# Patient Record
Sex: Female | Born: 1935 | Race: White | Hispanic: No | Smoking: Never smoker
Health system: Southern US, Community
[De-identification: ages and names within clinical notes are randomized; demographics above are authoritative.]

## PROBLEM LIST (undated history)

## (undated) DIAGNOSIS — I10 Essential (primary) hypertension: Secondary | ICD-10-CM

## (undated) DIAGNOSIS — J189 Pneumonia, unspecified organism: Secondary | ICD-10-CM

## (undated) DIAGNOSIS — E039 Hypothyroidism, unspecified: Secondary | ICD-10-CM

## (undated) DIAGNOSIS — Z8719 Personal history of other diseases of the digestive system: Secondary | ICD-10-CM

## (undated) DIAGNOSIS — R569 Unspecified convulsions: Secondary | ICD-10-CM

## (undated) DIAGNOSIS — N189 Chronic kidney disease, unspecified: Secondary | ICD-10-CM

## (undated) DIAGNOSIS — E7800 Pure hypercholesterolemia, unspecified: Secondary | ICD-10-CM

## (undated) DIAGNOSIS — C801 Malignant (primary) neoplasm, unspecified: Secondary | ICD-10-CM

## (undated) DIAGNOSIS — M4800 Spinal stenosis, site unspecified: Secondary | ICD-10-CM

## (undated) DIAGNOSIS — I890 Lymphedema, not elsewhere classified: Secondary | ICD-10-CM

## (undated) DIAGNOSIS — M1990 Unspecified osteoarthritis, unspecified site: Secondary | ICD-10-CM

## (undated) HISTORY — PX: TUBAL LIGATION: SHX77

## (undated) HISTORY — PX: APPENDECTOMY: SHX54

---

## 2004-02-26 ENCOUNTER — Ambulatory Visit: Payer: Self-pay | Admitting: Family Medicine

## 2004-06-13 ENCOUNTER — Ambulatory Visit: Payer: Self-pay

## 2005-01-13 ENCOUNTER — Ambulatory Visit: Payer: Self-pay | Admitting: Internal Medicine

## 2005-05-14 ENCOUNTER — Ambulatory Visit: Payer: Self-pay | Admitting: Family Medicine

## 2005-11-12 ENCOUNTER — Ambulatory Visit: Payer: Self-pay | Admitting: Gastroenterology

## 2006-05-18 ENCOUNTER — Ambulatory Visit: Payer: Self-pay | Admitting: Family Medicine

## 2006-08-26 ENCOUNTER — Ambulatory Visit: Payer: Self-pay | Admitting: Family Medicine

## 2006-09-03 ENCOUNTER — Ambulatory Visit: Payer: Self-pay | Admitting: Family Medicine

## 2007-08-16 ENCOUNTER — Ambulatory Visit: Payer: Self-pay | Admitting: Family Medicine

## 2008-09-05 ENCOUNTER — Ambulatory Visit: Payer: Self-pay | Admitting: Family Medicine

## 2009-10-15 ENCOUNTER — Ambulatory Visit: Payer: Self-pay | Admitting: Family Medicine

## 2010-12-18 ENCOUNTER — Ambulatory Visit: Payer: Self-pay | Admitting: Family Medicine

## 2012-01-13 ENCOUNTER — Ambulatory Visit: Payer: Self-pay | Admitting: Family Medicine

## 2012-02-10 DIAGNOSIS — R569 Unspecified convulsions: Secondary | ICD-10-CM

## 2012-02-10 HISTORY — DX: Unspecified convulsions: R56.9

## 2012-10-09 ENCOUNTER — Inpatient Hospital Stay: Payer: Self-pay | Admitting: Internal Medicine

## 2012-10-09 LAB — DRUG SCREEN, URINE
Amphetamines, Ur Screen: NEGATIVE (ref ?–1000)
Benzodiazepine, Ur Scrn: NEGATIVE (ref ?–200)
Cannabinoid 50 Ng, Ur ~~LOC~~: NEGATIVE (ref ?–50)
Cocaine Metabolite,Ur ~~LOC~~: NEGATIVE (ref ?–300)
MDMA (Ecstasy)Ur Screen: NEGATIVE (ref ?–500)
Opiate, Ur Screen: POSITIVE (ref ?–300)
Tricyclic, Ur Screen: NEGATIVE (ref ?–1000)

## 2012-10-09 LAB — TROPONIN I
Troponin-I: <0.02 ng/mL
Troponin-I: 0.03 ng/mL

## 2012-10-09 LAB — URINALYSIS, COMPLETE
Bilirubin,UR: NEGATIVE
Glucose,UR: NEGATIVE mg/dL (ref 0–75)
Leukocyte Esterase: NEGATIVE
Nitrite: NEGATIVE

## 2012-10-09 LAB — COMPREHENSIVE METABOLIC PANEL
Albumin: 3 g/dL — ABNORMAL LOW (ref 3.4–5.0)
Alkaline Phosphatase: 52 U/L (ref 50–136)
Anion Gap: 28 — ABNORMAL HIGH (ref 7–16)
Bilirubin,Total: 0.4 mg/dL (ref 0.2–1.0)
Calcium, Total: 7.9 mg/dL — ABNORMAL LOW (ref 8.5–10.1)
Co2: 7 mmol/L — CL (ref 21–32)
Creatinine: 3.96 mg/dL — ABNORMAL HIGH (ref 0.60–1.30)
EGFR (African American): 12 — ABNORMAL LOW
EGFR (Non-African Amer.): 10 — ABNORMAL LOW
Glucose: 250 mg/dL — ABNORMAL HIGH (ref 65–99)
Osmolality: 280 (ref 275–301)
SGPT (ALT): 55 U/L (ref 12–78)
Total Protein: 6.7 g/dL (ref 6.4–8.2)

## 2012-10-09 LAB — CBC
HCT: 38.7 % (ref 35.0–47.0)
HGB: 12.8 g/dL (ref 12.0–16.0)
MCH: 31.4 pg (ref 26.0–34.0)
MCHC: 33 g/dL (ref 32.0–36.0)
MCV: 95 fL (ref 80–100)
Platelet: 118 10*3/uL — ABNORMAL LOW (ref 150–440)
RBC: 4.07 10*6/uL (ref 3.80–5.20)
RDW: 13.8 % (ref 11.5–14.5)
WBC: 13.2 10*3/uL — ABNORMAL HIGH (ref 3.6–11.0)

## 2012-10-09 LAB — CK TOTAL AND CKMB (NOT AT ARMC)
CK, Total: 2279 U/L — ABNORMAL HIGH (ref 21–215)
CK, Total: 1732 U/L — ABNORMAL HIGH (ref 21–215)
CK-MB: 24.7 ng/mL — ABNORMAL HIGH (ref 0.5–3.6)
CK-MB: 22.2 ng/mL — ABNORMAL HIGH (ref 0.5–3.6)

## 2012-10-09 LAB — BASIC METABOLIC PANEL
BUN: 55 mg/dL — ABNORMAL HIGH (ref 7–18)
Chloride: 100 mmol/L (ref 98–107)
Creatinine: 2.9 mg/dL — ABNORMAL HIGH (ref 0.60–1.30)
EGFR (African American): 17 — ABNORMAL LOW
Glucose: 411 mg/dL — ABNORMAL HIGH (ref 65–99)
Osmolality: 290 (ref 275–301)
Sodium: 128 mmol/L — ABNORMAL LOW (ref 136–145)

## 2012-10-09 LAB — PROTIME-INR: Prothrombin Time: 16.5 secs — ABNORMAL HIGH (ref 11.5–14.7)

## 2012-10-10 ENCOUNTER — Ambulatory Visit: Payer: Self-pay | Admitting: Neurology

## 2012-10-10 LAB — CBC WITH DIFFERENTIAL/PLATELET
Basophil #: 0.2 10*3/uL — ABNORMAL HIGH (ref 0.0–0.1)
Eosinophil %: 0 %
HCT: 33.6 % — ABNORMAL LOW (ref 35.0–47.0)
Lymphocyte #: 1.9 10*3/uL (ref 1.0–3.6)
MCH: 31.4 pg (ref 26.0–34.0)
MCHC: 34.5 g/dL (ref 32.0–36.0)
Monocyte #: 1 x10 3/mm — ABNORMAL HIGH (ref 0.2–0.9)
Monocyte %: 9.2 %
Neutrophil %: 72.2 %
Platelet: 121 10*3/uL — ABNORMAL LOW (ref 150–440)
RBC: 3.69 10*6/uL — ABNORMAL LOW (ref 3.80–5.20)

## 2012-10-10 LAB — COMPREHENSIVE METABOLIC PANEL
Albumin: 2.2 g/dL — ABNORMAL LOW (ref 3.4–5.0)
Anion Gap: 9 (ref 7–16)
BUN: 44 mg/dL — ABNORMAL HIGH (ref 7–18)
Bilirubin,Total: 0.3 mg/dL (ref 0.2–1.0)
Chloride: 108 mmol/L — ABNORMAL HIGH (ref 98–107)
Creatinine: 2.7 mg/dL — ABNORMAL HIGH (ref 0.60–1.30)
EGFR (Non-African Amer.): 16 — ABNORMAL LOW
Glucose: 185 mg/dL — ABNORMAL HIGH (ref 65–99)
Potassium: 3.8 mmol/L (ref 3.5–5.1)
SGOT(AST): 130 U/L — ABNORMAL HIGH (ref 15–37)
SGPT (ALT): 44 U/L (ref 12–78)
Sodium: 134 mmol/L — ABNORMAL LOW (ref 136–145)
Total Protein: 5.2 g/dL — ABNORMAL LOW (ref 6.4–8.2)

## 2012-10-10 LAB — MAGNESIUM: Magnesium: 1.1 mg/dL — ABNORMAL LOW

## 2012-10-10 LAB — HEMOGLOBIN A1C: Hemoglobin A1C: 6.7 % — ABNORMAL HIGH (ref 4.2–6.3)

## 2012-10-10 LAB — CK TOTAL AND CKMB (NOT AT ARMC): CK-MB: 22.4 ng/mL — ABNORMAL HIGH (ref 0.5–3.6)

## 2012-10-11 LAB — CBC WITH DIFFERENTIAL/PLATELET
Basophil #: 0.1 10*3/uL (ref 0.0–0.1)
Basophil %: 0.6 %
HGB: 11 g/dL — ABNORMAL LOW (ref 12.0–16.0)
Lymphocyte %: 22.9 %
MCV: 91 fL (ref 80–100)
Monocyte %: 9.4 %
Neutrophil %: 67 %
Platelet: 109 10*3/uL — ABNORMAL LOW (ref 150–440)
WBC: 9.8 10*3/uL (ref 3.6–11.0)

## 2012-10-11 LAB — BASIC METABOLIC PANEL
Calcium, Total: 6.4 mg/dL — CL (ref 8.5–10.1)
Chloride: 110 mmol/L — ABNORMAL HIGH (ref 98–107)
Co2: 17 mmol/L — ABNORMAL LOW (ref 21–32)
EGFR (African American): 25 — ABNORMAL LOW
Glucose: 169 mg/dL — ABNORMAL HIGH (ref 65–99)
Osmolality: 278 (ref 275–301)
Potassium: 4.4 mmol/L (ref 3.5–5.1)

## 2012-10-11 LAB — CK: CK, Total: 1788 U/L — ABNORMAL HIGH (ref 21–215)

## 2012-10-11 LAB — URINE CULTURE

## 2012-10-11 LAB — MAGNESIUM: Magnesium: 2 mg/dL

## 2012-10-11 LAB — CLOSTRIDIUM DIFFICILE BY PCR

## 2012-10-12 LAB — BASIC METABOLIC PANEL
Anion Gap: 8 (ref 7–16)
Calcium, Total: 6.6 mg/dL — CL (ref 8.5–10.1)
Co2: 17 mmol/L — ABNORMAL LOW (ref 21–32)
EGFR (African American): 35 — ABNORMAL LOW
EGFR (Non-African Amer.): 30 — ABNORMAL LOW
Osmolality: 275 (ref 275–301)
Sodium: 136 mmol/L (ref 136–145)

## 2012-10-12 LAB — CK: CK, Total: 4722 U/L — ABNORMAL HIGH (ref 21–215)

## 2012-10-13 LAB — BASIC METABOLIC PANEL
Anion Gap: 7 (ref 7–16)
Chloride: 110 mmol/L — ABNORMAL HIGH (ref 98–107)
Creatinine: 1.34 mg/dL — ABNORMAL HIGH (ref 0.60–1.30)
EGFR (African American): 43 — ABNORMAL LOW
EGFR (Non-African Amer.): 37 — ABNORMAL LOW
Glucose: 124 mg/dL — ABNORMAL HIGH (ref 65–99)
Osmolality: 275 (ref 275–301)
Potassium: 4.2 mmol/L (ref 3.5–5.1)

## 2012-10-13 LAB — PRO B NATRIURETIC PEPTIDE: B-Type Natriuretic Peptide: 1757 pg/mL — ABNORMAL HIGH (ref 0–450)

## 2012-10-14 DIAGNOSIS — I359 Nonrheumatic aortic valve disorder, unspecified: Secondary | ICD-10-CM

## 2012-10-14 LAB — BASIC METABOLIC PANEL
Calcium, Total: 7.7 mg/dL — ABNORMAL LOW (ref 8.5–10.1)
Creatinine: 1.15 mg/dL (ref 0.60–1.30)
Glucose: 123 mg/dL — ABNORMAL HIGH (ref 65–99)
Sodium: 137 mmol/L (ref 136–145)

## 2012-10-14 LAB — CULTURE, BLOOD (SINGLE)

## 2012-10-14 LAB — TROPONIN I: Troponin-I: 0.11 ng/mL — ABNORMAL HIGH

## 2012-10-14 LAB — PLATELET COUNT: Platelet: 287 10*3/uL (ref 150–440)

## 2012-10-15 LAB — TROPONIN I: Troponin-I: 0.08 ng/mL — ABNORMAL HIGH

## 2012-10-15 LAB — STOOL CULTURE

## 2012-10-16 ENCOUNTER — Ambulatory Visit: Payer: Self-pay | Admitting: Orthopedic Surgery

## 2012-10-18 LAB — CREATININE, SERUM
EGFR (African American): 51 — ABNORMAL LOW
EGFR (Non-African Amer.): 44 — ABNORMAL LOW

## 2012-10-19 LAB — CBC WITH DIFFERENTIAL/PLATELET
Basophil #: 0 10*3/uL (ref 0.0–0.1)
Basophil %: 0.1 %
Eosinophil #: 0.1 10*3/uL (ref 0.0–0.7)
HCT: 30.3 % — ABNORMAL LOW (ref 35.0–47.0)
HGB: 10.7 g/dL — ABNORMAL LOW (ref 12.0–16.0)
Lymphocyte %: 20.6 %
MCHC: 35.3 g/dL (ref 32.0–36.0)
MCV: 93 fL (ref 80–100)
Platelet: 487 10*3/uL — ABNORMAL HIGH (ref 150–440)
RBC: 3.26 10*6/uL — ABNORMAL LOW (ref 3.80–5.20)

## 2012-10-20 LAB — BASIC METABOLIC PANEL
Anion Gap: 8 (ref 7–16)
Anion Gap: 7 (ref 7–16)
BUN: 24 mg/dL — ABNORMAL HIGH (ref 7–18)
Calcium, Total: 8.5 mg/dL (ref 8.5–10.1)
Calcium, Total: 8.6 mg/dL (ref 8.5–10.1)
Chloride: 98 mmol/L (ref 98–107)
Chloride: 98 mmol/L (ref 98–107)
Creatinine: 1.42 mg/dL — ABNORMAL HIGH (ref 0.60–1.30)
Creatinine: 1.45 mg/dL — ABNORMAL HIGH (ref 0.60–1.30)
EGFR (African American): 40 — ABNORMAL LOW
EGFR (Non-African Amer.): 35 — ABNORMAL LOW
EGFR (Non-African Amer.): 34 — ABNORMAL LOW
Glucose: 114 mg/dL — ABNORMAL HIGH (ref 65–99)
Glucose: 189 mg/dL — ABNORMAL HIGH (ref 65–99)
Osmolality: 269 (ref 275–301)
Osmolality: 272 (ref 275–301)
Potassium: 4.1 mmol/L (ref 3.5–5.1)
Potassium: 4 mmol/L (ref 3.5–5.1)
Sodium: 132 mmol/L — ABNORMAL LOW (ref 136–145)
Sodium: 131 mmol/L — ABNORMAL LOW (ref 136–145)

## 2012-10-21 LAB — BASIC METABOLIC PANEL
BUN: 24 mg/dL — ABNORMAL HIGH (ref 7–18)
Chloride: 98 mmol/L (ref 98–107)
Creatinine: 1.24 mg/dL (ref 0.60–1.30)
EGFR (African American): 48 — ABNORMAL LOW
Osmolality: 273 (ref 275–301)
Potassium: 4 mmol/L (ref 3.5–5.1)
Sodium: 131 mmol/L — ABNORMAL LOW (ref 136–145)

## 2012-11-03 LAB — CBC
HCT: 33.3 % — ABNORMAL LOW (ref 35.0–47.0)
HGB: 11.3 g/dL — ABNORMAL LOW (ref 12.0–16.0)
MCH: 32.3 pg (ref 26.0–34.0)
MCHC: 34 g/dL (ref 32.0–36.0)
RBC: 3.5 10*6/uL — ABNORMAL LOW (ref 3.80–5.20)
RDW: 15.4 % — ABNORMAL HIGH (ref 11.5–14.5)
WBC: 22.3 10*3/uL — ABNORMAL HIGH (ref 3.6–11.0)

## 2012-11-03 LAB — COMPREHENSIVE METABOLIC PANEL
Albumin: 3.3 g/dL — ABNORMAL LOW (ref 3.4–5.0)
Alkaline Phosphatase: 180 U/L — ABNORMAL HIGH (ref 50–136)
Anion Gap: 8 (ref 7–16)
BUN: 23 mg/dL — ABNORMAL HIGH (ref 7–18)
Co2: 28 mmol/L (ref 21–32)
EGFR (Non-African Amer.): 32 — ABNORMAL LOW
SGOT(AST): 25 U/L (ref 15–37)
Sodium: 134 mmol/L — ABNORMAL LOW (ref 136–145)

## 2012-11-03 LAB — TROPONIN I: Troponin-I: <0.02 ng/mL

## 2012-11-03 LAB — T4, FREE: Free Thyroxine: 1.42 ng/dL (ref 0.76–1.46)

## 2012-11-03 LAB — URINALYSIS, COMPLETE
Ketone: NEGATIVE
Leukocyte Esterase: NEGATIVE
Nitrite: NEGATIVE
Ph: 5 (ref 4.5–8.0)
Protein: NEGATIVE
RBC,UR: 1 /HPF (ref 0–5)
Squamous Epithelial: NONE SEEN

## 2012-11-03 LAB — CK TOTAL AND CKMB (NOT AT ARMC)
CK, Total: 56 U/L (ref 21–215)
CK-MB: 0.9 ng/mL (ref 0.5–3.6)

## 2012-11-04 ENCOUNTER — Inpatient Hospital Stay: Payer: Self-pay | Admitting: Specialist

## 2012-11-04 LAB — BASIC METABOLIC PANEL
Anion Gap: 8 (ref 7–16)
BUN: 23 mg/dL — ABNORMAL HIGH (ref 7–18)
Calcium, Total: 7.9 mg/dL — ABNORMAL LOW (ref 8.5–10.1)
Chloride: 99 mmol/L (ref 98–107)
Creatinine: 1.27 mg/dL (ref 0.60–1.30)
EGFR (African American): 46 — ABNORMAL LOW
EGFR (Non-African Amer.): 40 — ABNORMAL LOW
Osmolality: 276 (ref 275–301)
Potassium: 3 mmol/L — ABNORMAL LOW (ref 3.5–5.1)
Sodium: 135 mmol/L — ABNORMAL LOW (ref 136–145)

## 2012-11-04 LAB — CBC WITH DIFFERENTIAL/PLATELET
Basophil #: 0.1 10*3/uL (ref 0.0–0.1)
Basophil %: 0.4 %
Eosinophil %: 1.5 %
Lymphocyte #: 1.5 10*3/uL (ref 1.0–3.6)
Lymphocyte %: 12.2 %
MCH: 32.3 pg (ref 26.0–34.0)
MCHC: 34.4 g/dL (ref 32.0–36.0)
MCV: 94 fL (ref 80–100)
Monocyte #: 0.7 x10 3/mm (ref 0.2–0.9)
Monocyte %: 5.5 %
RBC: 2.91 10*6/uL — ABNORMAL LOW (ref 3.80–5.20)
RDW: 15 % — ABNORMAL HIGH (ref 11.5–14.5)
WBC: 12.6 10*3/uL — ABNORMAL HIGH (ref 3.6–11.0)

## 2012-11-04 LAB — MAGNESIUM: Magnesium: 0.7 mg/dL — ABNORMAL LOW

## 2012-11-04 LAB — AMMONIA: Ammonia, Plasma: <25 mcmol/L (ref 11–32)

## 2012-11-05 LAB — URINALYSIS, COMPLETE
Bilirubin,UR: NEGATIVE
Glucose,UR: 50 mg/dL (ref 0–75)
Ketone: NEGATIVE
Leukocyte Esterase: NEGATIVE
Nitrite: NEGATIVE
Ph: 5 (ref 4.5–8.0)
Squamous Epithelial: 1
WBC UR: 31 /HPF (ref 0–5)

## 2012-11-05 LAB — CBC WITH DIFFERENTIAL/PLATELET
Basophil #: 0.1 10*3/uL (ref 0.0–0.1)
Basophil %: 0.9 %
HCT: 27.9 % — ABNORMAL LOW (ref 35.0–47.0)
HGB: 9.6 g/dL — ABNORMAL LOW (ref 12.0–16.0)
Lymphocyte #: 1.4 10*3/uL (ref 1.0–3.6)
MCH: 32.6 pg (ref 26.0–34.0)
MCHC: 34.5 g/dL (ref 32.0–36.0)
MCV: 95 fL (ref 80–100)
Monocyte #: 0.8 x10 3/mm (ref 0.2–0.9)
Monocyte %: 9.2 %
Neutrophil #: 6.2 10*3/uL (ref 1.4–6.5)
Neutrophil %: 71.2 %
RDW: 15.3 % — ABNORMAL HIGH (ref 11.5–14.5)

## 2012-11-05 LAB — BASIC METABOLIC PANEL
BUN: 20 mg/dL — ABNORMAL HIGH (ref 7–18)
Chloride: 101 mmol/L (ref 98–107)
Co2: 29 mmol/L (ref 21–32)
Creatinine: 1.39 mg/dL — ABNORMAL HIGH (ref 0.60–1.30)
Glucose: 88 mg/dL (ref 65–99)
Osmolality: 274 (ref 275–301)
Potassium: 3.3 mmol/L — ABNORMAL LOW (ref 3.5–5.1)

## 2012-11-05 LAB — URINE CULTURE

## 2012-11-05 LAB — MAGNESIUM: Magnesium: 3.3 mg/dL — ABNORMAL HIGH

## 2012-11-06 LAB — BASIC METABOLIC PANEL
Anion Gap: 5 — ABNORMAL LOW (ref 7–16)
BUN: 17 mg/dL (ref 7–18)
Calcium, Total: 8.6 mg/dL (ref 8.5–10.1)
Chloride: 100 mmol/L (ref 98–107)
Co2: 29 mmol/L (ref 21–32)
Creatinine: 1.23 mg/dL (ref 0.60–1.30)
EGFR (African American): 48 — ABNORMAL LOW
Glucose: 97 mg/dL (ref 65–99)
Potassium: 3.9 mmol/L (ref 3.5–5.1)
Sodium: 134 mmol/L — ABNORMAL LOW (ref 136–145)

## 2012-11-09 LAB — CULTURE, BLOOD (SINGLE)

## 2012-11-11 ENCOUNTER — Ambulatory Visit: Payer: Self-pay | Admitting: Neurology

## 2012-11-14 ENCOUNTER — Emergency Department: Payer: Self-pay | Admitting: Emergency Medicine

## 2012-11-14 LAB — CBC
HCT: 33.5 % — ABNORMAL LOW (ref 35.0–47.0)
MCH: 32.2 pg (ref 26.0–34.0)
MCHC: 34.6 g/dL (ref 32.0–36.0)
MCV: 93 fL (ref 80–100)
RDW: 14.8 % — ABNORMAL HIGH (ref 11.5–14.5)
WBC: 9.1 10*3/uL (ref 3.6–11.0)

## 2012-11-14 LAB — COMPREHENSIVE METABOLIC PANEL
Albumin: 3.6 g/dL (ref 3.4–5.0)
Alkaline Phosphatase: 138 U/L — ABNORMAL HIGH (ref 50–136)
BUN: 14 mg/dL (ref 7–18)
Bilirubin,Total: 0.3 mg/dL (ref 0.2–1.0)
Chloride: 105 mmol/L (ref 98–107)
Creatinine: 1.77 mg/dL — ABNORMAL HIGH (ref 0.60–1.30)
EGFR (African American): 31 — ABNORMAL LOW
EGFR (Non-African Amer.): 27 — ABNORMAL LOW
Osmolality: 273 (ref 275–301)
Potassium: 4.1 mmol/L (ref 3.5–5.1)
SGPT (ALT): 27 U/L (ref 12–78)
Sodium: 136 mmol/L (ref 136–145)
Total Protein: 7.1 g/dL (ref 6.4–8.2)

## 2012-11-14 LAB — URINALYSIS, COMPLETE
Bilirubin,UR: NEGATIVE
Blood: NEGATIVE
Glucose,UR: NEGATIVE mg/dL (ref 0–75)
Leukocyte Esterase: NEGATIVE
RBC,UR: 1 /HPF (ref 0–5)
Specific Gravity: 1.02 (ref 1.003–1.030)
WBC UR: 2 /HPF (ref 0–5)

## 2012-11-21 ENCOUNTER — Emergency Department: Payer: Self-pay | Admitting: Emergency Medicine

## 2012-11-21 LAB — CBC
HCT: 30.6 % — ABNORMAL LOW (ref 35.0–47.0)
HGB: 10.6 g/dL — ABNORMAL LOW (ref 12.0–16.0)
MCHC: 34.8 g/dL (ref 32.0–36.0)
MCV: 93 fL (ref 80–100)
Platelet: 273 10*3/uL (ref 150–440)
RBC: 3.3 10*6/uL — ABNORMAL LOW (ref 3.80–5.20)
RDW: 14.7 % — ABNORMAL HIGH (ref 11.5–14.5)

## 2012-11-22 LAB — URINALYSIS, COMPLETE
Glucose,UR: NEGATIVE mg/dL (ref 0–75)
Ketone: NEGATIVE
Protein: NEGATIVE
RBC,UR: 1 /HPF (ref 0–5)
Squamous Epithelial: NONE SEEN
WBC UR: <1 /HPF (ref 0–5)

## 2012-11-22 LAB — COMPREHENSIVE METABOLIC PANEL
Albumin: 3.5 g/dL (ref 3.4–5.0)
Anion Gap: 9 (ref 7–16)
BUN: 23 mg/dL — ABNORMAL HIGH (ref 7–18)
Chloride: 103 mmol/L (ref 98–107)
Creatinine: 1.63 mg/dL — ABNORMAL HIGH (ref 0.60–1.30)
EGFR (African American): 34 — ABNORMAL LOW
EGFR (Non-African Amer.): 29 — ABNORMAL LOW
Glucose: 93 mg/dL (ref 65–99)
Osmolality: 273 (ref 275–301)
SGOT(AST): 34 U/L (ref 15–37)
SGPT (ALT): 24 U/L (ref 12–78)

## 2012-11-22 LAB — DRUG SCREEN, URINE
Barbiturates, Ur Screen: NEGATIVE (ref ?–200)
Benzodiazepine, Ur Scrn: NEGATIVE (ref ?–200)
Cocaine Metabolite,Ur ~~LOC~~: NEGATIVE (ref ?–300)
Phencyclidine (PCP) Ur S: NEGATIVE (ref ?–25)

## 2013-01-02 ENCOUNTER — Ambulatory Visit: Payer: Self-pay | Admitting: Family Medicine

## 2013-01-13 ENCOUNTER — Ambulatory Visit: Payer: Self-pay | Admitting: Family Medicine

## 2013-05-18 DIAGNOSIS — E785 Hyperlipidemia, unspecified: Secondary | ICD-10-CM | POA: Insufficient documentation

## 2013-05-18 DIAGNOSIS — E039 Hypothyroidism, unspecified: Secondary | ICD-10-CM | POA: Insufficient documentation

## 2013-05-18 DIAGNOSIS — I10 Essential (primary) hypertension: Secondary | ICD-10-CM | POA: Insufficient documentation

## 2014-02-07 ENCOUNTER — Ambulatory Visit: Payer: Self-pay | Admitting: Family Medicine

## 2014-02-17 IMAGING — CT CT HEAD WITHOUT CONTRAST
2 series · 16 of 30 positions shown, 20 images · non-contrast
Comparison: none

REASON FOR EXAM: ams
COMMENTS:

[Series 2: without · axial · non-contrast · 0.43mm/px · z∈[-105,+15]mm · 13 of 29 slices shown, 17 images]
[im 3/29  brain]
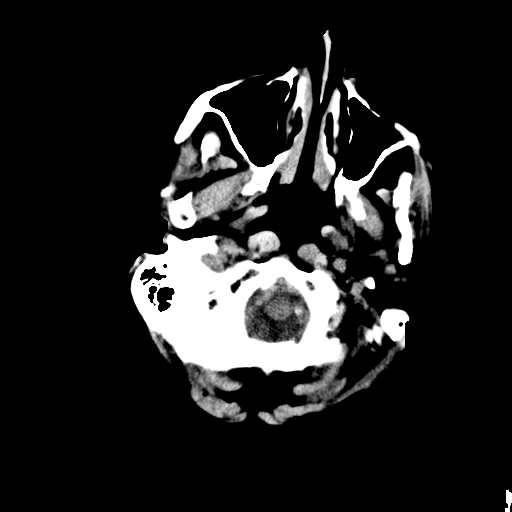
[im 3/29  bone]
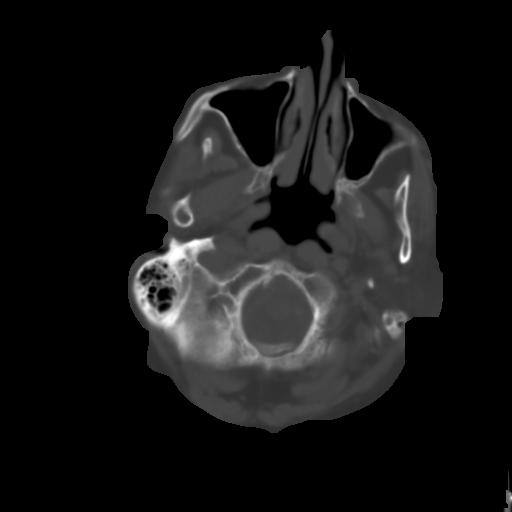
[im 5/29  brain]
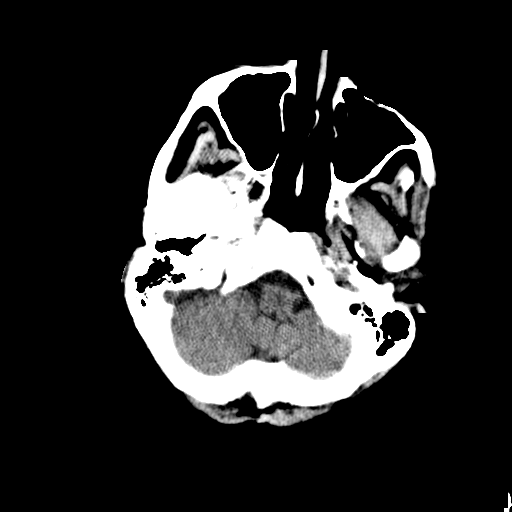
[im 7/29  brain]
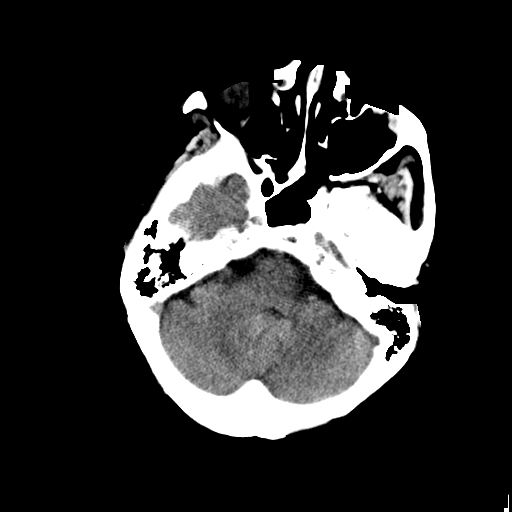
[im 9/29  brain]
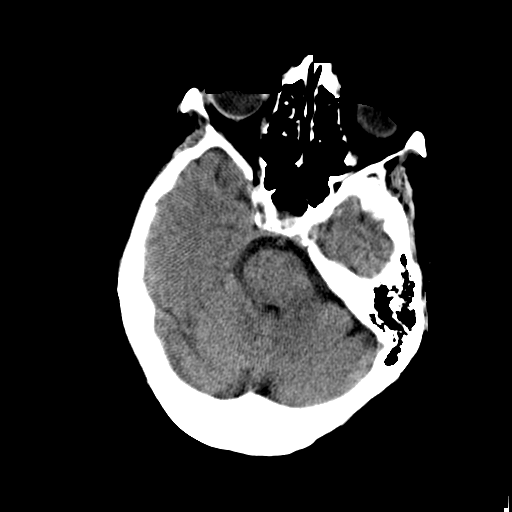
[im 11/29  brain]
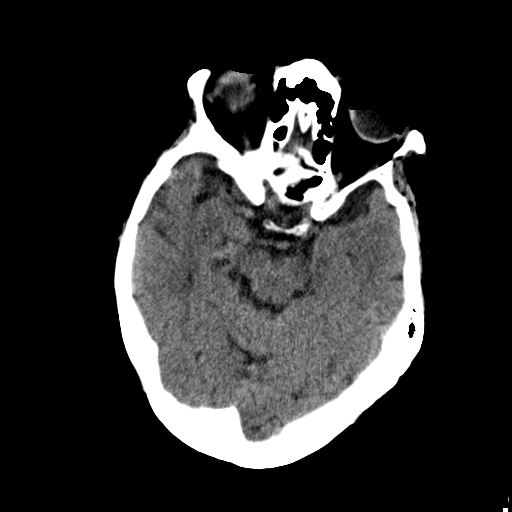
[im 11/29  bone]
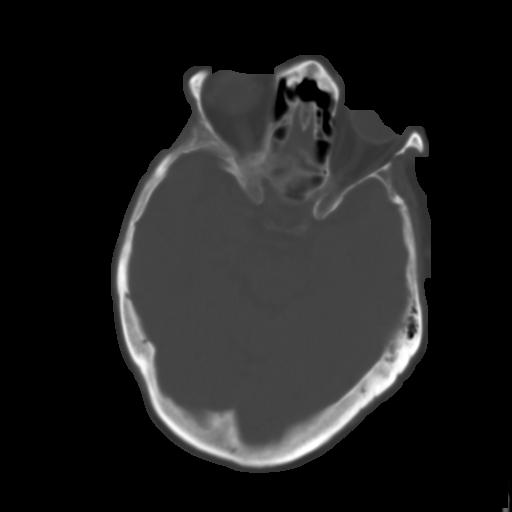
[im 13/29  brain]
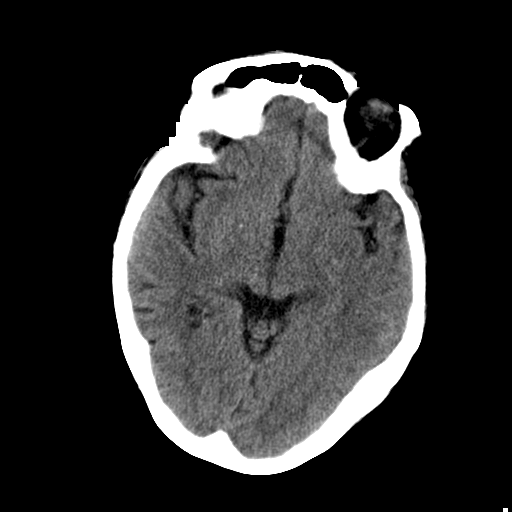
[im 15/29  brain]
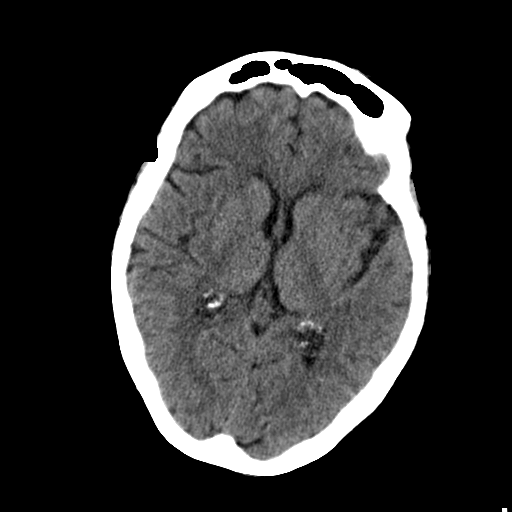
[im 17/29  brain]
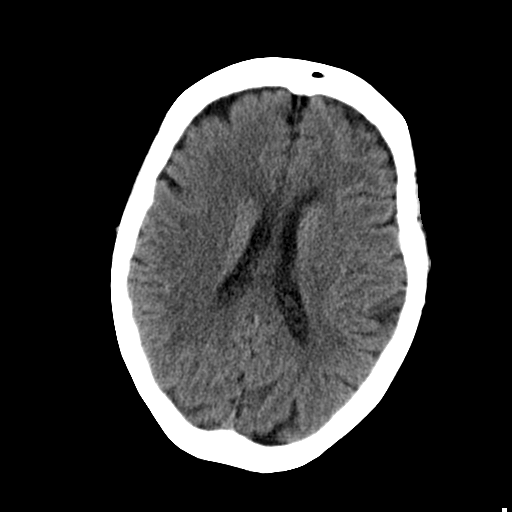
[im 19/29  brain]
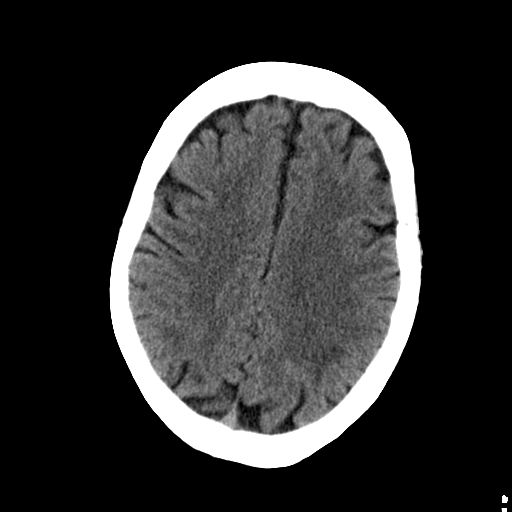
[im 19/29  bone]
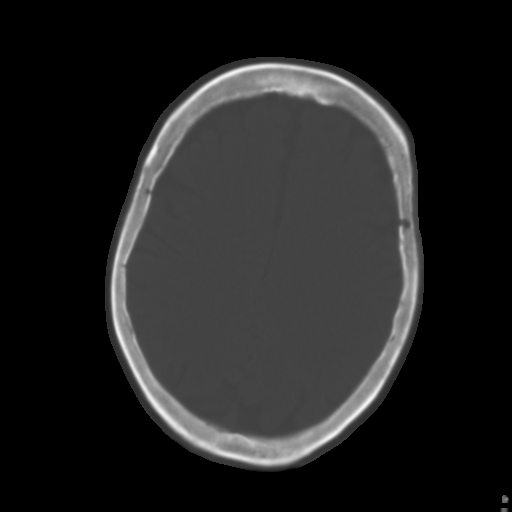
[im 21/29  brain]
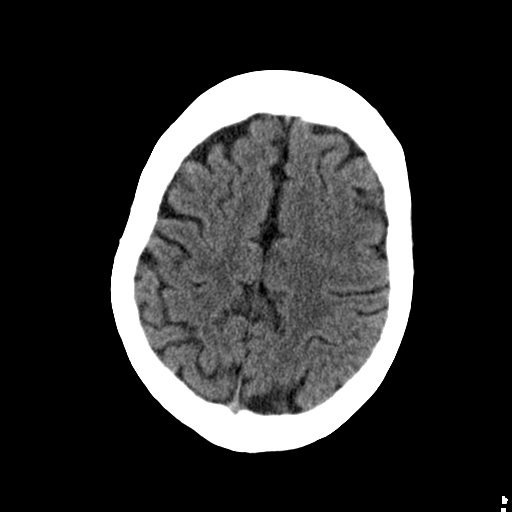
[im 23/29  brain]
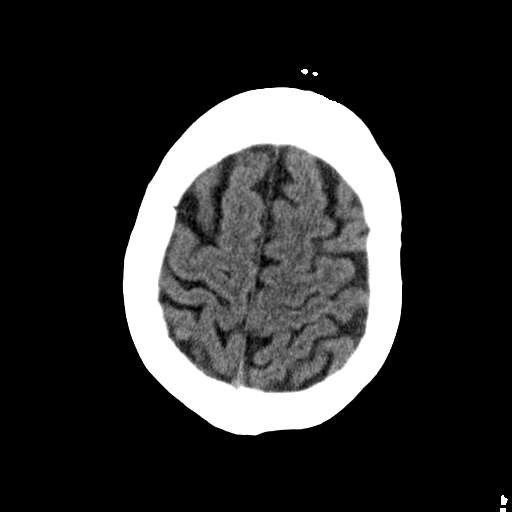
[im 25/29  brain]
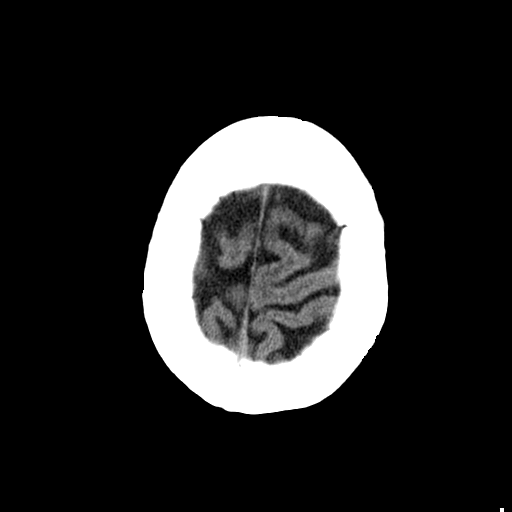
[im 27/29  brain]
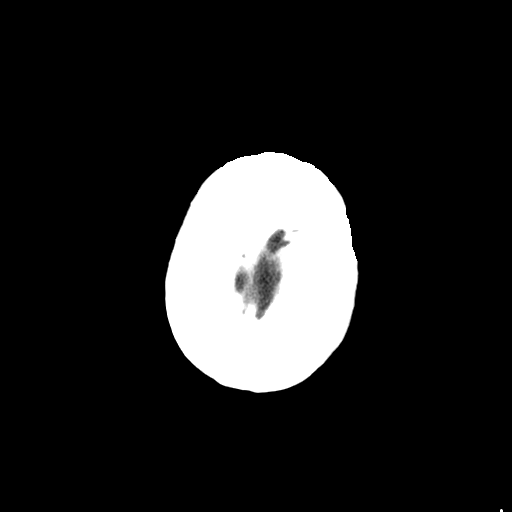
[im 27/29  bone]
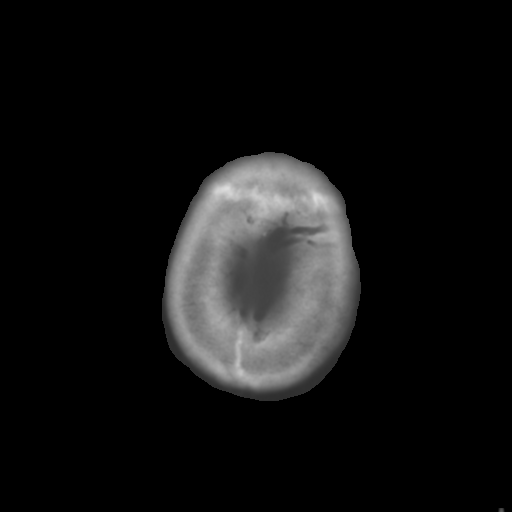

[Series 3: bone · axial · 0.43mm/px · z∈[-105,-65]mm · 3 of 29 slices shown]
[im 3/29  bone]
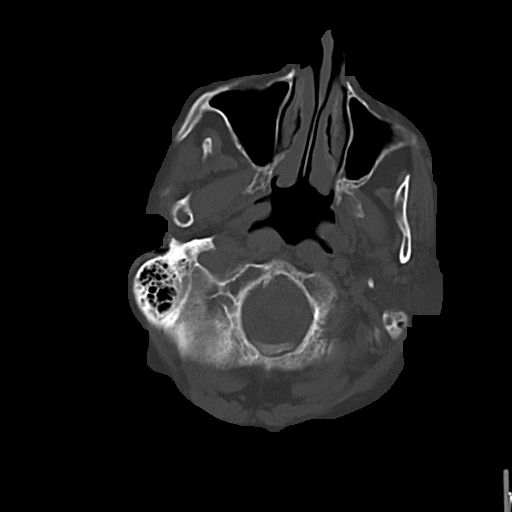
[im 7/29  bone]
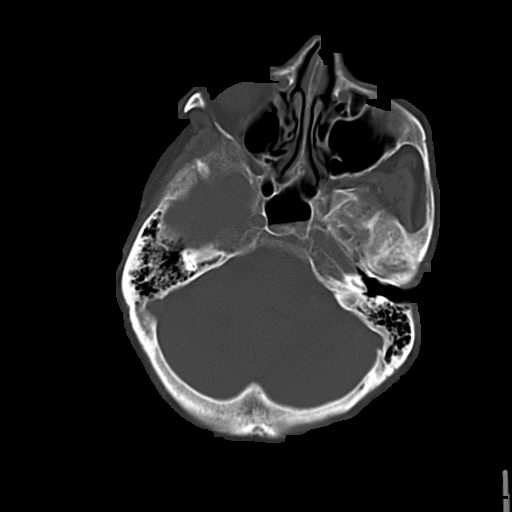
[im 11/29  bone]
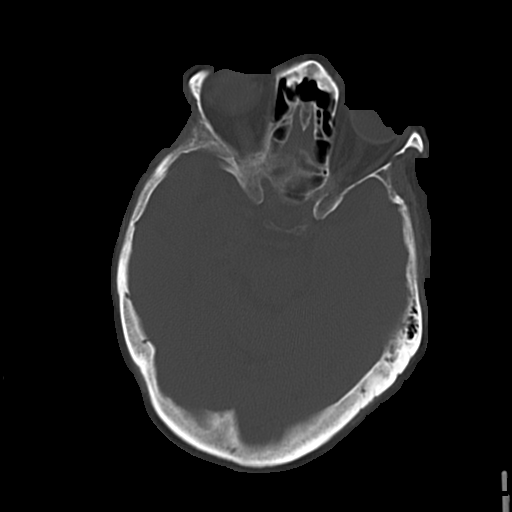

[16 of 30 positions shown; findings below may reference images not displayed]

PROCEDURE:     CT  - CT HEAD WITHOUT CONTRAST  - November 03, 2012  [DATE]

RESULT:     Noncontrast CT of the brain and is performed. The ventricles and
sulci are within normal limits for the patient's age. There is mild
low-attenuation in the periventricular and subcortical white matter. Basal
ganglia calcification is present. There is no intracranial hemorrhage, mass,
mass effect or evolving infarct. The sinuses and mastoid air cells show
normal appearing aeration. The calvarium appears intact.
IMPRESSION: 1. Atrophy with chronic microvascular ischemic disease. No acute
intracranial abnormality.

[REDACTED]

## 2014-02-17 IMAGING — CT CT CHEST-ABD-PELV W/O CM
1 of 2 series · 13 of 29 positions shown, 18 images · non-contrast
Comparison: none

REASON FOR EXAM: (1) left lung opacitty; (2) abd pain
COMMENTS:

[Series 2: soft tissue · axial · 0.89mm/px · z∈[-950,-426]mm · 13 of 197 slices shown, 18 images]
[im 11/197  mediastinal]
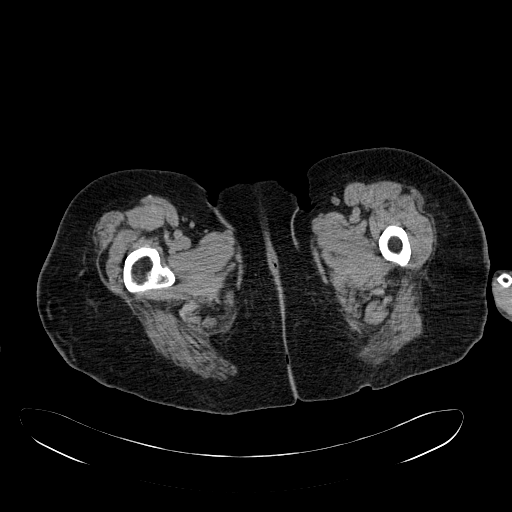
[im 11/197  bone]
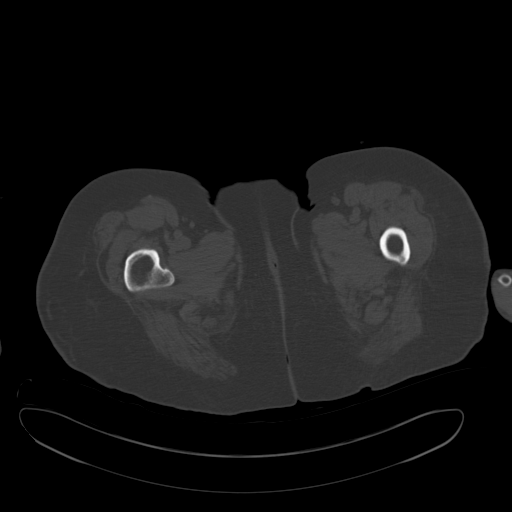
[im 33/197  mediastinal]
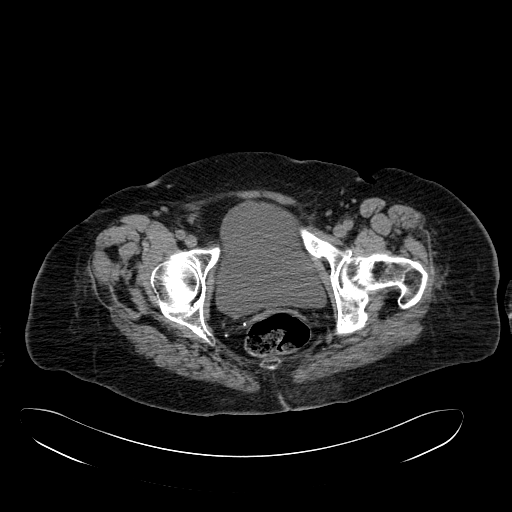
[im 44/197  mediastinal]
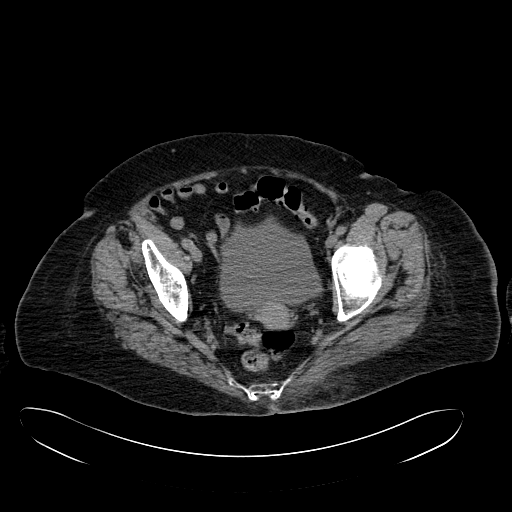
[im 66/197  mediastinal]
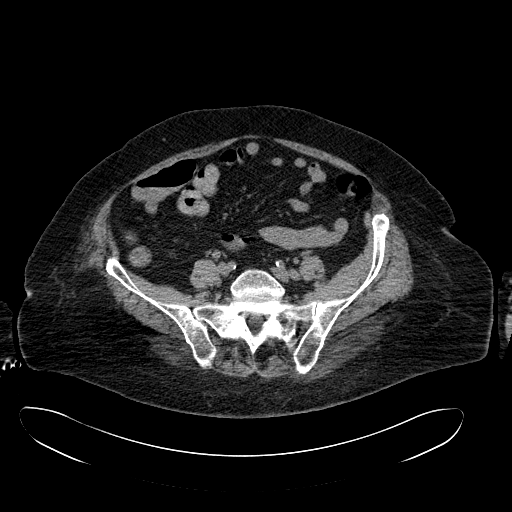
[im 77/197  mediastinal]
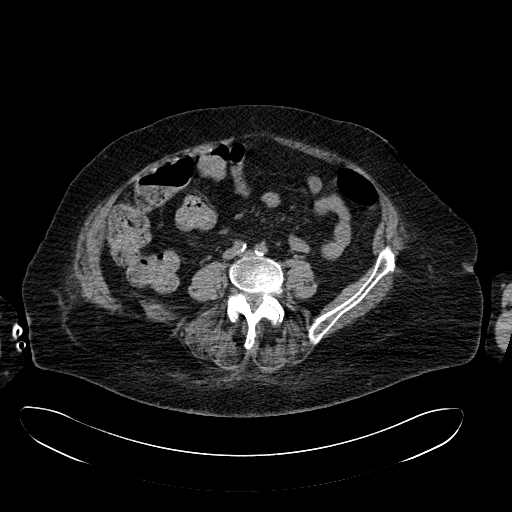
[im 96/197  mediastinal]
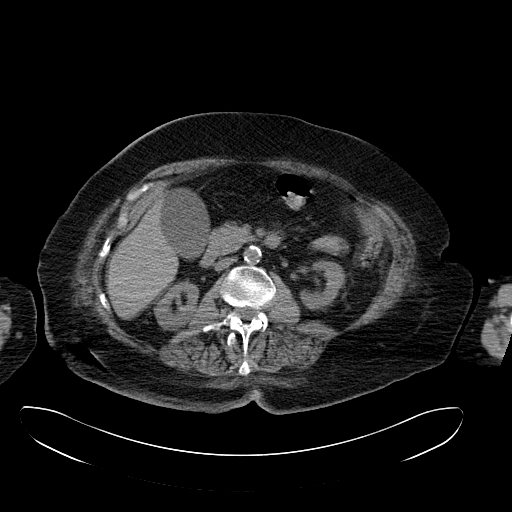
[im 99/197  mediastinal]
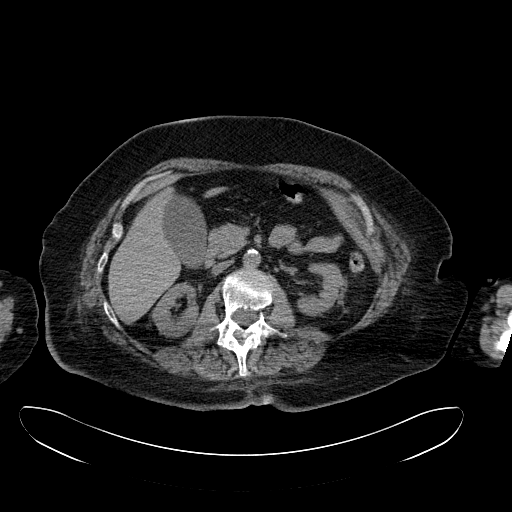
[im 120/197  mediastinal]
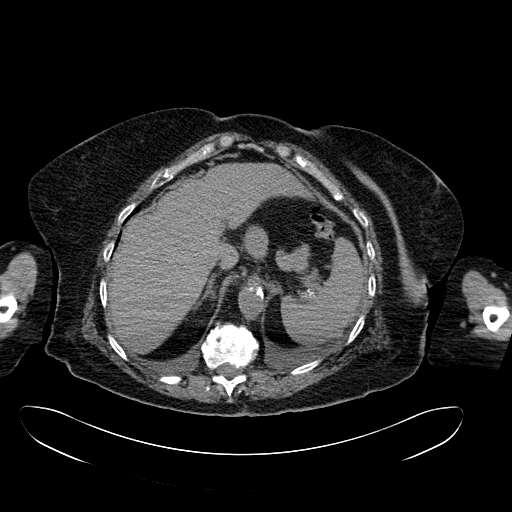
[im 131/197  mediastinal]
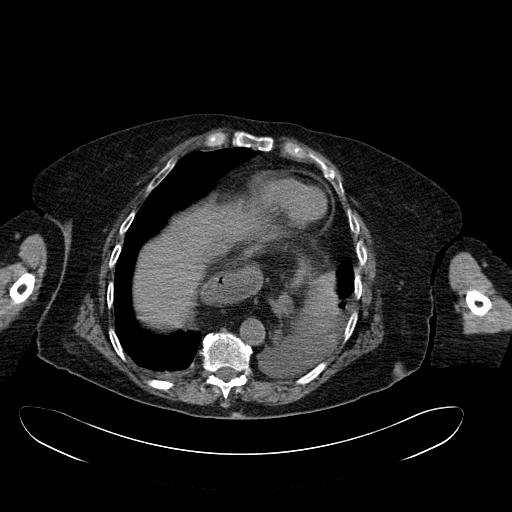
[im 131/197  bone]
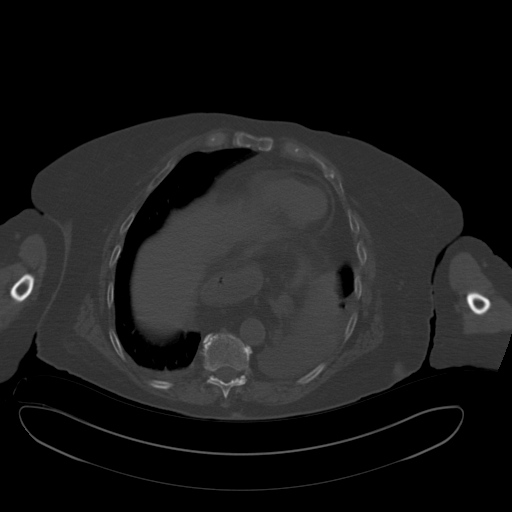
[im 153/197  mediastinal]
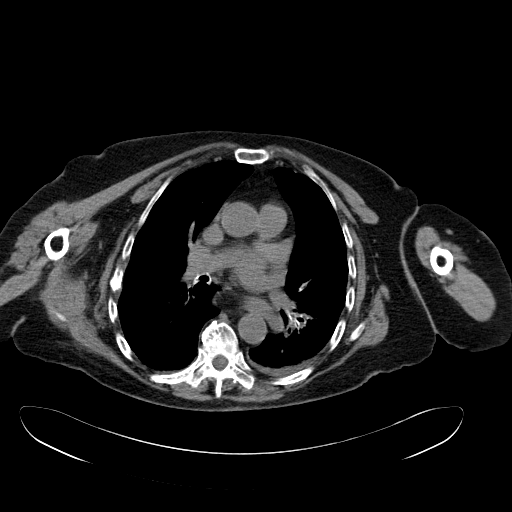
[im 153/197  lung]
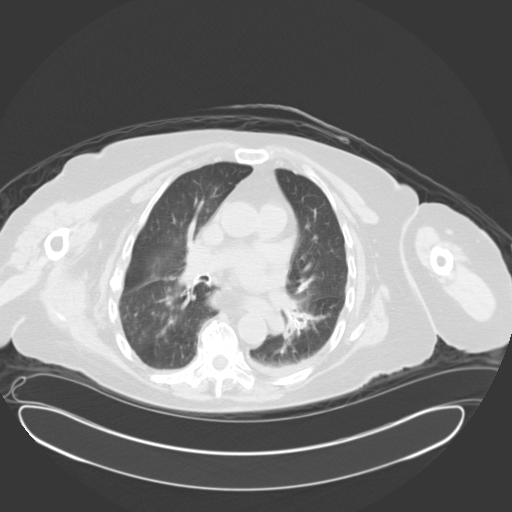
[im 164/197  mediastinal]
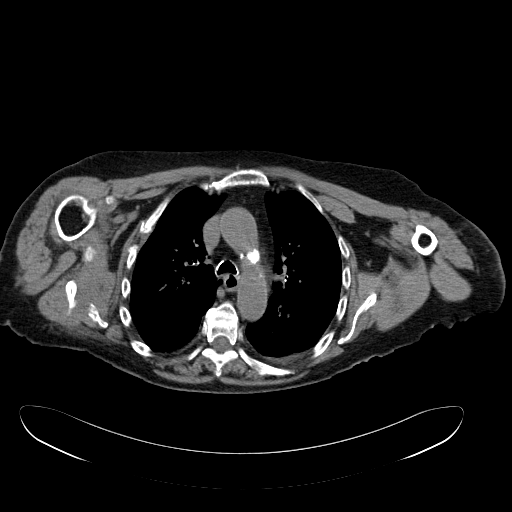
[im 164/197  lung]
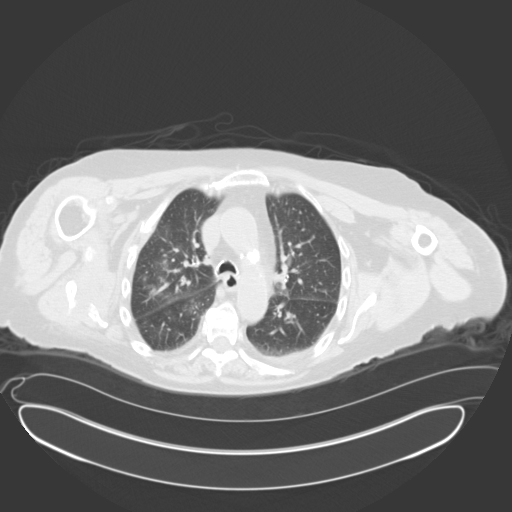
[im 175/197  lung]
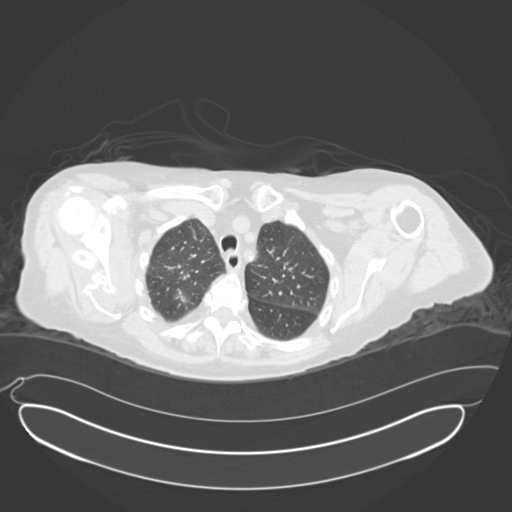
[im 186/197  mediastinal]
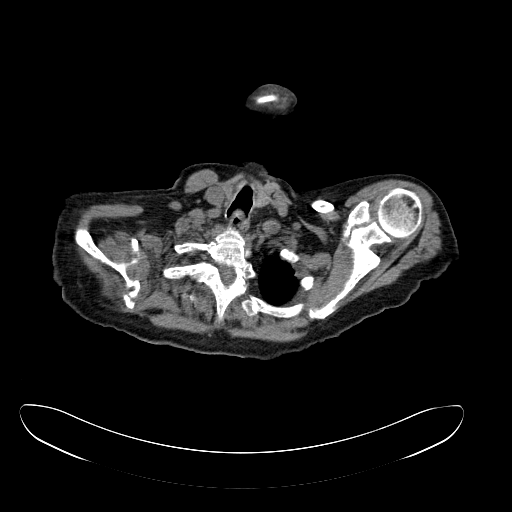
[im 186/197  lung]
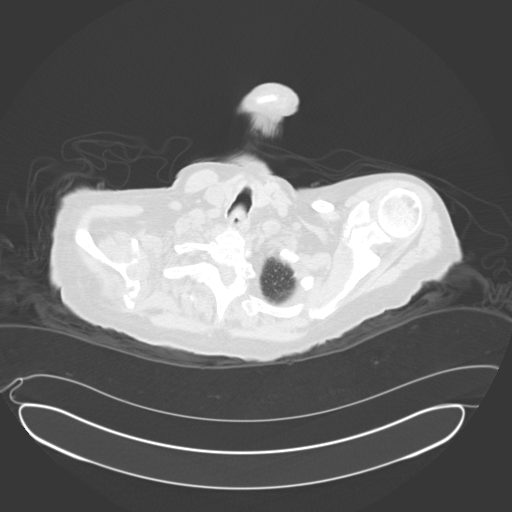

[13 of 29 positions shown; findings below may reference images not displayed]

PROCEDURE:     CT  - CT CHEST ABDOMEN AND PELVIS WO  - November 03, 2012  [DATE]

RESULT:     Noncontrast CT of the chest, abdomen and pelvis is performed.
The patient has no previous similar exam for comparison. Images are
reconstructed at 3.0 mm slice thickness in the axial plane.

There appears to be a large hiatus hernia with a large part of the stomach
being intrathoracic. There is a small left pleural effusion with some left
lung base atelectasis or infiltrate and trace right pleural effusion. There
is no pneumothorax. Patchy groundglass areas of densities are seen in the
right upper lobe, superior segment of both lower lobes and in the right
middle lobe. These could represent areas of atelectasis or edema. Early
infiltrate is felt to be less likely but not excluded. There is no
mediastinal or hilar mass evident. The heart is nonenlarged. The gallbladder
is distended without definite radiopaque stones. There is no nephrolithiasis
or hydronephrosis appreciated. Perinephric stranding is present bilaterally.
The abdominal images are somewhat degraded by patient respiratory motion
artifact. The noncontrast images of the liver, spleen, pancreas, and adrenal
glands appear normal. Atherosclerotic calcification is seen within the
abdominal aorta without evidence of aneurysm. There is no evidence of bowel
obstruction or perforation. The uterus is present. The urinary bladder
contains a moderate amount of fluid without abnormal distention. There is no
ascites evident. There is a right adnexal cyst measuring 2.5 cm. There is no
adenopathy. There is no abscess. There degenerative changes in the thoracic
and lumbar spine without a definite acute bony abnormality.
IMPRESSION: 1. Hiatal hernia with a large part of the stomach being intrathoracic. There
is some pleural effusion bilaterally with left lung base atelectasis or
minimal infiltrate and patchy groundglass areas of density in both lungs
which is nonspecific.
2. No acute abdominal or pelvic abnormality. Right adnexal cystic mass is
present. Gynecologic followup is suggested with followup pelvic ultrasound
in 8 weeks to assess any interval change.

[REDACTED]

## 2014-06-01 NOTE — Discharge Summary (Signed)
PATIENT NAME:  Theresa Snyder, PLETZ MR#:  [id number] DATE OF BIRTH:  [birth year]  DATE OF ADMISSION:  11/04/2012 DATE OF DISCHARGE:  11/07/2012  For a detailed note, please take a look at look at the history and physical done on admission by Dr. Waldron Labs.     DIAGNOSES AT DISCHARGE 1.  Altered mental status, likely secondary to simple partial seizures.  2.  Aspiration pneumonitis.  3.  Hypertension.  4.  Hypothyroidism.  5.  Hyperlipidemia.  6.  A right-sided adnexal mass.  7.  Urinary incontinence.   DIET: The patient is being discharged on a low sodium, low fat diet.   ACTIVITY: As tolerated.   FOLLOWUP: With [facility] in the next 1 to 2 weeks. Also, follow up with Dr. Jennings Books tomorrow at 4:30 p.m. and follow up with [facility] in 2 to 4 weeks.   DISCHARGE MEDICATIONS:  Pravachol 20 mg daily, aspirin 325 mg daily, diltiazem 240 mg daily, Synthroid 25 mcg daily, oxybutynin 5 mg daily, Haldol 0.5 mg daily as needed, Risperdal 0.5 mg b.i.d., Keppra 500 mg b.i.d. and Augmentin 500 mg b.i.d. x 5 days.   [place] COURSE: Dr. Phoebe Perch from general surgery, Dr. Jennings Books from neurology.   PERTINENT STUDIES DONE DURING THE HOSPITAL COURSE: Are as follows: CT scan of the head done without contrast showing atrophy with chronic microvascular ischemic disease. CT chest, abdomen and pelvis done without contrast showing a hiatal hernia, a small pleural effusion, no acute abdominal or pelvic abnormality; right adnexal cystic mass is present, gynecologic followup is suggested with followup pelvic ultrasound in 8 weeks to assess any interval change.   HOSPITAL COURSE: This is an 81 year old female with medical problems as mentioned above, who presented to the hospital on November 04, 2012, secondary to altered mental status.  1.  Altered mental status. Most likely cause of patient's mental status change was probably related to simple partial seizures. The  patient has had no previous history of seizure-type activity. The patient was seen by neurology, initially loaded with Keppra and maintained on the oral Keppra now for the past 3 to 4 days. She has had no further seizure-type activity and her mental status has remained stable. She did undergo a CT scan of the head which showed no acute abnormality. The patient presently will be discharged on oral Keppra with close followup from neurology. The patient is due to see Dr. Jennings Books tomorrow at 4:30 p.m. The patient still needs further workup of her seizures including EEG and MRI, but that is to be done as an outpatient. At this point, the patient is not to drive and to not operate any heavy machinery at this time.  2.  Acute respiratory failure. This was likely secondary to aspiration pneumonitis. There was some concern initially whether she could have had a hospital-acquired pneumonia as she came from a skilled nursing facility. She was empirically started on vancomycin and Zosyn. Blood cultures and sputum cultures were obtained. The patient was on IV Vanco and Zosyn for a few days. Her blood cultures have remained stable. Her respiratory status has significantly improved since admission and she is back down to baseline. I switched her from IV antibiotics to oral Augmentin is currently being discharged on 5 days of oral Augmentin for treatment for underlying possible aspiration pneumonitis. The-hospital acquired pneumonia was likely ruled out.  3.  Hypokalemia. This was likely secondary to poor p.o. intake. It has since then been supplemented  and improved and resolved.  4.  Hypomagnesemia, also likely secondary to her poor p.o. intake. This is also significantly improved and resolved with supplementation.  5.  Acute renal failure. This was likely secondary to dehydration. The patient was hydrated with IV fluids. Her creatinine is now back down to baseline.  6.  Right adnexal mass. This was incidentally noted on  the CT scan abdomen and pelvis on admission.  The patient is being referred to [place] for further followup and a possible pelvic ultrasound.  7.  Hypertension: The patient remained hemodynamically stable. She will continue her Cardizem.    8.  Hypothyroidism. The patient was maintained on her Synthroid and she will resume that upon discharge.   The patient was seen by physical therapy, thought she would benefit from going back to a short-term rehab, [facility], which is where she came from although patient refuses to do that at this time, would prefer to go home with home health services. Therefore, she is being discharged with home health physical therapy and nursing services presently. The patient and the family are in agreement with this plan.   Time spent on discharge is 45 minutes.    ____________________________ Belia Heman. Verdell Carmine, MD vjs:cs D: 11/07/2012 15:16:00 ET T: 11/07/2012 15:39:13 ET JOB#: [id number]  cc: Belia Heman. Verdell Carmine, MD, <Dictator> Hemang K. Manuella Ghazi, [place] MD ELECTRONICALLY SIGNED 11/16/2012 19:13

## 2014-06-01 NOTE — Consult Note (Signed)
PATIENT NAME:  ADAM, DEMARY MR#:  [id number] DATE OF BIRTH:  1929-06-03  DATE OF CONSULTATION:  10/10/2012  REFERRING PHYSICIAN:  Fulton Reek, MD CONSULTING PHYSICIAN:  Isaias Cowman, MD  CHIEF COMPLAINT: Unresponsiveness.   REASON FOR CONSULTATION: Requested for evaluation of elevated cardiac enzymes.   HISTORY OF PRESENT ILLNESS: The patient is an 81 year old female with history of hypertension and hyperlipidemia. The patient apparently had a 3 to 4 day history of diarrhea. On 10/09/2012, the patient was found to be unresponsive at home. She was brought to [facility] Emergency Room where she was noted to be experiencing seizure activity. The patient was found to be in metabolic acidosis and was hypotensive and was intubated and placed on pressors. The patient was admitted to the CCU. Admission labs were notable for a BUN and creatinine of 65 and 3.96, respectively. Total CK and MB were 2,279 and 24.7, respectively. Troponin was less than 0.02. Arterial blood gas was notable for a pH of 7, pCO2 24 and pO2 of 157 with a bicarb of 5.9, all consistent with metabolic acidosis. EKG was nondiagnostic.   PAST MEDICAL HISTORY: 1.  Hypertension.  2.  Hyperlipidemia.  3.  Hypothyroidism.   MEDICATIONS ON ADMISSION: 1.  Pravachol 20 mg daily. 2.  Hydrochlorothiazide 25 mg daily. 3.  Lotensin 40 mg daily. 4.  Norvasc 5 mg daily. 5.  Atenolol 25 mg daily. 6.  Aspirin 325 mg daily. 7.  Synthroid 88 mcg daily. 8.  Norco 5/325 mg 1/2 to 1 tablet b.i.d. p.r.n.   SOCIAL HISTORY: The patient apparently has no history for tobacco or EtOH abuse.   FAMILY HISTORY: No history for myocardial infarction or coronary artery disease.   REVIEW OF SYSTEMS: Not obtainable.   PHYSICAL EXAMINATION: VITAL SIGNS: Blood pressure 81/44, pulse 74, respirations 14, temperature 98.1, pulse oximetry 100%.  HEENT: The patient is intubated.  NECK: Supple.  LUNGS: Clear.  HEART: Normal JVP. Normal PMI. Regular rate  and rhythm. Normal S1 and S2. No appreciable gallop, murmur or rub.  ABDOMEN: Soft and nontender. Pulses were intact bilaterally.  MUSCULOSKELETAL: Normal muscle tone.  NEUROLOGIC: The patient is intubated and comatose.   IMPRESSION: An 81 year old female who presents with 3 to 4 day history of diarrhea, found to be in severe metabolic acidosis requiring intubation. The patient has acute renal failure as well as seizure disorder. Total CPK and MB were elevated, however, CK-MB was less than 1% of total. Troponin is negative, which rules out non-ST-elevation myocardial infarction. I doubt seriously the patient has acute coronary syndrome.   RECOMMENDATIONS: 1.  Agree with overall current therapy.  2.  Would defer full dose anticoagulation.  3.  Would defer further cardiac diagnostics at this time.  ____________________________ Isaias Cowman, MD ap:sb D: 10/10/2012 08:47:51 ET T: 10/10/2012 11:09:40 ET JOB#: [id number]  cc: Isaias Cowman, MD, <Dictator> Isaias Cowman MD ELECTRONICALLY SIGNED 10/20/2012 7:59

## 2014-06-01 NOTE — Consult Note (Signed)
PATIENT NAME:  Theresa Snyder, Theresa Snyder MR#:  [id number] DATE OF BIRTH:  [birth year]  DATE OF CONSULTATION:  11/04/2012  REFERRING PHYSICIAN:  Phillips Climes, MD CONSULTING PHYSICIAN:  Hemang K. Manuella Ghazi, MD  PRIMARY CARE PHYSICIAN:  [facility].  REASON FOR CONSULTATION:  Altered mental status.   HISTORY OF PRESENT ILLNESS:  Ms. Lemire is an 81 year old Caucasian female.  She was recently admitted to [facility] for respiratory failure and septic shock.  She was discharged to the skilled nursing facility.  She was noted to have good cognition, but nursing home staff noted a possible seizure with her eye deviation to the right hand and hand-shaking.   The patient was brought to the hospital.  At that time she was quite confused and then was noted to be lucid.   She is quite confused again.   Most of the history is obtained by the chart review and talking to referring physician personally as well as the nursing staff.   The patient did not have any known tongue biting, but she is noted to have significant leukocytosis and possible lung infection from the work-up.   PAST MEDICAL HISTORY:  Significant for benign hypertension, hyperlipidemia, hypothyroidism, previous leg trauma.   PAST SURGICAL HISTORY:  Significant for tubal ligation.   SOCIAL HISTORY:  Negative for tobacco and alcohol abuse.   FAMILY HISTORY:  Significant for hypertension and stroke.   REVIEW OF SYSTEMS:  Unobtainable due to her poor mental status.    MEDICATIONS:  I reviewed her home medication list.   PHYSICAL EXAMINATION: VITAL SIGNS:  Temperature is 98.2, pulse 77, respiratory rate 18, blood pressure 114/77, pulse ox 96%.  GENERAL:  She is an elderly-looking Caucasian female lying in bed.  Do not have any other family members around her.   She has mild pedal edema.  LUNGS:  Clear to auscultation.  HEART:  S1, S2 heart sounds.  Carotid exam did not reveal any bruit.  ABDOMEN:  Her belly was soft.   EYES:   Funduscopic exam was attempted, but was not able to be done.  NEUROLOGIC:  The patient was looking to the right, was very minimally responsive.  She did seem to be alert.   But she would speak in one word.  For example, "momma" and then she will have a long pause and then will say "son" and then pause and then say "my daughter" and then pause, etc.   She did not make sense, but she did follow one-step commands such as squeeze my hands and move the legs, etc.   The patient does seem to have a persistent right gaze deviation.  I cannot check her other higher cognitive functions such as attention, concentration, memory due to her current condition.   Her cranial nerves, her pupils are equal.  She was not able to cross the midline.  She does have intact vestibulo ocular reflex though.   I did see some jerking of her eyes to the right.   Her face is symmetric.  Tongue was midline.  Facial sensations were intact.  Her hearing was intact.  Her palate was upgoing.  Her shoulder shrug and neck strength seems to be symmetric.   The patient does not seem to have any neck stiffness.  She was able to touch the chin to her chest.   On her motor exam, the patient noted to have high-frequency jerking of her right arm.  It was very low amplitude.  I did not feel like  it was a resting tremor of parkinsonism.   Her arm was not in motion so I do not think that that was postural tremor.  I did feel like it might be simple motor seizure.   During some part of the exam, her right leg was also jerking, but I did not notice any significant jerking of her left arm or leg.   She does seem to have some increased tone on the right.   She did squeeze my hands, but it was hard to do good motor exam.   Her sensory exam was unreliable.  Her reflexes were unreliable.  I cannot check her coordination or gait reliably.   ASSESSMENT AND PLAN: 1.  Recurrent spells of confusion in a patient with a current exam with right eye  deviated to the right with the right hand jerking and occasional leg jerking.  I would like to get the patient started on levetiracetam 1500 mg twice a day, as a loading dose, followed by 500 mg twice a day after two days.  The patient ideally should have an EEG, but we do not have EEG available over the weekend.  The patient should get MRI of the brain with and without contrast depending upon if her renal function is okay.  There is a concern for infection such as meningitis or encephalitis as a cause of her problems.  She has a supple neck.  She does have a leukocytosis and altered mental status, it would be difficult to tap her.  So we will see if the antiepileptic medication helps her.  The patient is already on antibiotics.  2.  Encephalopathy which can be multifactorial in addition to her seizure due to the patient's age and poor medical condition which can cause confusion.  The patient should have a vitamin B12, TSH checked.  The patient also has a significant hypomagnesemia which can precipitate her seizures.   I will check this patient out to the weekend local covering neurologist.    ____________________________ Hemang K. Manuella Ghazi, MD hks:ea D: 11/04/2012 15:45:00 ET T: 11/04/2012 16:20:00 ET JOB#: [id number]  cc: Hemang K. Manuella Ghazi, MD, <Dictator> Royetta Crochet [facility] MD ELECTRONICALLY SIGNED 12/02/2012 7:02

## 2014-06-01 NOTE — Consult Note (Signed)
Brief Consult Note: Diagnosis: hiatal hernia.   Patient was seen by consultant.   Consult note dictated.   Comments: Pt is not a candidate for [facility] repair due to multiple medical problems and morbidity/mortality associated with fundoplication. Will sign  off and be available if needed.  Electronic Signatures: Florene Glen (MD)  (Signed 26-Sep-14 16:46)  Authored: Brief Consult Note   Last Updated: 26-Sep-14 16:46 by Florene Glen (MD)

## 2014-06-01 NOTE — H&P (Signed)
PATIENT NAME:  Theresa Snyder, SCALISI MR#:  [id number] DATE OF BIRTH:  01-02-1930  DATE OF ADMISSION:  10/09/2012  PATIENT NAME: Theresa Snyder LA SSI DT.   DATE OF ADMISSION: 10/09/2012.   REFERRING PHYSICIAN: Dr. Cinda Quest.   FAMILY PHYSICIAN: [facility].   REASON FOR ADMISSION: Unresponsiveness.   HISTORY OF PRESENT ILLNESS: The patient is an 81 year old female with a history of hypertension, hyperlipidemia and hypothyroidism who has had apparently a 3 to 4-day history of diarrhea at home. She was found unresponsive by her family today, where she was brought to the Emergency Room. In the Emergency Room, the patient was unable to protect her airway. She was having seizure activity in the Emergency Room. She was found to be extremely acidotic and hypotensive. She was intubated and placed on Precedex and dopamine drips. She is now admitted for further evaluation. The patient is unable to give history and the family is only aware of her recent diarrheal illness.   PAST MEDICAL HISTORY 1.  Benign hypertension.  2.  Hyperlipidemia.  3.  Hypothyroidism.  4.  Previous leg trauma.  5.  Status post bilateral tubal ligation.   MEDICATIONS 1.  Pravachol 20 mg p.o. daily.  2.  Synthroid 88 mcg p.o. daily.  3.  Hydrochlorothiazide 25 mg p.o. daily.  4.  Lotensin 40 mg p.o. daily.  5.  Norvasc 5 mg p.o. daily.  6.  Atenolol 25 mg p.o. daily.  7.  Aspirin 325 mg p.o. daily.  8.  Norco 5/325, 1/2 to 1 tablet b.i.d. p.r.n. pain.   ALLERGIES: IVP DYE.   SOCIAL HISTORY: Negative for alcohol or tobacco abuse.   FAMILY HISTORY: Positive for hypertension and stroke but otherwise unremarkable.   REVIEW OF SYSTEMS: Unable to obtain from the patient.   PHYSICAL EXAMINATION GENERAL: The patient is critically ill-appearing, obtunded, on mechanical ventilation.  VITAL SIGNS: Currently remarkable for a blood pressure of 108/71 on dopamine with a heart rate of 77 and a respiratory rate of 22.  HEENT:  Pupils are enlarged/dilated and only minimally reactive. Oropharynx is dry but clear.  NECK: Supple without JVD. No adenopathy is noted.  LUNGS: Basilar rhonchi without wheezes or rales. No dullness.  CARDIAC: Regular rate and rhythm with normal S1, S2. No significant rubs, murmurs or gallops. PMI is nondisplaced.  ABDOMEN: Soft, nontender, with normoactive bowel sounds. No organomegaly or masses were appreciated. No hernias or bruits were noted.  EXTREMITIES: Without clubbing, cyanosis or edema. Pulses are 1+ bilaterally.  SKIN: Warm and dry without rash or lesions.  NEUROLOGIC: Unable to be assessed due to the patient's sedation.  PSYCH: Likewise.   LABORATORY DATA: TSH was 2.19 with a troponin of less than 0.02. Her glucose was 250 with a BUN of 65, creatinine of 3.96 and a sodium of 126 with a potassium of 3.5. Her GFR was 10. Anion gap 28. Drug screen positive for opiates. White count was 13.2 with a hemoglobin of 12.8 and a platelet count of 118,000. Pro time 16.5 with an INR of 1.3. Urinalysis was unremarkable except for 2+ blood and trace ketones. ABG on the vent revealed a pH of 7.00 with a pCO2 of 24 and a pO2 of 157. Chest x-ray revealed no acute infiltrate or edema. Head CT revealed no acute ischemic or hemorrhagic infarct or no mass effect.   ASSESSMENT 1.  Metabolic acidosis.  2.  Acute respiratory failure.  3.  Acute renal failure.  4.  Seizures.  5.  Hypotension.  6.  Hyponatremia.  7.  Hyperglycemia.  8.  Recent diarrheal illness.   PLAN: The patient will be admitted to the Intensive Care Unit on dopamine and Precedex drips. We will send off blood and urine cultures and begin empiric IV antibiotics. We will continue mechanical ventilation at this time and repeat an ABG in 2 to 3 hours to follow up on her acidosis. We will titrate the vent as needed at that time. We will consult nephrology because of her acute renal failure and pulmonology for further evaluation of her  respiratory distress and failure. Follow up routine chest x-ray and labs in the morning. Continue supportive care for now. She is a full code. This has been discussed with her family, who was present. Further treatment and evaluation will depend upon the patient's progress.   TOTAL TIME SPENT ON THIS PATIENT: 50 minutes.   ____________________________ Leonie Douglas Doy Hutching, MD jds:cs D: 10/09/2012 13:39:13 ET T: 10/09/2012 15:51:33 ET JOB#: [id number]  cc: Leonie Douglas. Doy Hutching, MD, <Dictator> [place] Dylanie Quesenberry Lennice Sites MD ELECTRONICALLY SIGNED 10/09/2012 16:54

## 2014-06-01 NOTE — Discharge Summary (Signed)
PATIENT NAME:  Theresa Snyder, BANCROFT MR#:  [id number] DATE OF BIRTH:  Feb 03, 1930  DATE OF ADMISSION:  10/09/2012 DATE OF DISCHARGE:  10/20/2012  DISPOSITION: To [facility].   DISCHARGE DIAGNOSES: 1.  Acute respiratory failure, resolved.  2.  Septic shock ( resolved. 3.  Viral gastroenteritis, resolved.  4.  Acute renal failure, resolved. 5.  Paroxysmal atrial fibrillation, resolved.  6.  Episodes of confusion.  7. Right scapular fracture, right rib fractures, nonoperative management recommended by orthopedics.  The patient has a sling to the right shoulder and getting physical therapy with nonweightbearing status right shoulder.   DISCHARGE MEDICATIONS:   1.  Pravastatin 20 mg p.o. daily.  2.  Aspirin 325 mg p.o. daily.  3.  Cardizem 240 mg p.o. daily. 4.  Risperdal 0.5 mg p.o. b.i.d. at 9:00 and 5:00. 5.  Haldol 0.5 mg daily as needed for agitation.  6.  The patient is also on levothyroxine 25 mcg p.o. daily.  7.  Oxybutynin 5 mg p.o. daily.  8.  Stop the following medications:  HCTZ, benazepril and Percocet.   DIET: Low-sodium diet.   CONSULTATIONS:  1.  Orthopedic consult with Dr. Daine Gip.  2.  Psychiatric consult with Dr. Franchot Mimes and Dr. Weber Cooks.  3.  Neurology consult with Dr. Irish Elders.  4.  Respiratory/pulmonary consult with Dr. Devona Konig. 5.  Nephrology consult with Dr. Inocente Salles lateef.  6.  Cardiology consult with Dr. Saralyn Pilar.  7.  Dietary referral.  PROCEDURES:  Echocardiogram, x-ray of the right shoulder and ribs,  lower extremity ultrasound, CT of the head, chest x-rays.   HOSPITAL COURSE: 1.  Septic shock.  The patient is an 81 year old female patient with history of hypertension, hyperlipidemia, hypothyroidism, came in because of unresponsiveness. Look at the history and physical for full details. The patient had 3 to 4 days of diarrhea, found unresponsive by family, and the patient was unable to protect the airway in the ER, so she was intubated for severe metabolic  acidosis for airway protection. Admitted to ICU for respiratory failure, and she started on sedation and also started on antibiotics. Septic shock secondary to severe diarrhea with metabolic acidosis. The patient was started on dopamine along with fluids and stools for C. diff, ova and parasites were sent. She received IV fluids also along with dopamine, and she was started on vanc and Zosyn. Stool cultures were sent. The patient's white count was 13.2 on admission. Urine cultures were negative and x-ray of chest showed a small CHF on admission, and the patient finally was able to come off the pressors. Blood culture did not show any growth, and the patient's stool cultures were negative for C. diff and it showed heavy growth of Candida, but no Salmonella or shigella.  The patient moved out of ICU and was extubated. The patient's O2 sats were like 96% on 1 liter. Diarrhea thought to be secondary to viral. Has severe metabolic acidosis and acute renal failure, seen by the nephrologist. The patient had severe hyponatremia with severe metabolic acidosis, acute renal failure. Creatinine on admission 3.96 and  bicarb was 7, sodium 126. The patient was started on dopamine and fluids and renal failure improved. Most recent BUN 23, creatinine 1.42 on 11 September, but before that she had BUN of 10 and creatinine 1.15 on 5th of September.  2.  Paroxysmal Afib with RVR, heart rate of 150s during hospital stay. Required a Cardizem drip and monitored in the ICU.  Seen by cardiology, Dr. Saralyn Pilar. The patient  required Cardizem IV for a few hours then changed to Cardizem 30 mg q.6 along with metoprolol 25 mg q.6 hours. The patient moved out of the ICU. She is right now on Cardizem CD 240 mg. Beta blockers were stopped because of episode of hypotension and near syncope. The patient did have an echocardiogram which showed EF more than 65% with no wall motion abnormality.  3.  Right shoulder pain. The patient complained of right  shoulder pain and possible fall at home. X-ray of the right shoulder showed right scapular fracture and also had right posterior rib fractures in the 3rd, 4th and 5th ribs. Seen by orthopedic  physician, recommended conservative management with shoulder sling, and seen by Dr. Mikal Plane in followup yesterday. He recommended nonweightbearing status of the shoulder and physical therapy. Dr. Sabra Heck recommended a hemi-walker in the left hand because of right scapular fracture and full weight-bearing as tolerated for the legs. The patient is able to participate in physical therapy. As of yesterday, the patient performed transfer to bed with minimal assistance from chair and had an episode of tachycardia doing the exercises, then PT was concluded. Physical therapy to be continued to  emphasize on bed mobility and progressive therapeutic exercises.  4.  Hypothyroidism.  Continue Synthroid. 5.  Confusion.  The patient has episodes of confusion. Initially, she was seen by Dr. Franchot Mimes and also in followup with Dr. Weber Cooks.   She was started on Risperdal 0.5 p.o. b.i.d. That was working for her for a while, but then yesterday she had agitation and confusion still, so the daughter was worried. Dr. Weber Cooks recommended only Risperdal but no other medications. The patient actually was agitated and required 2.5 mg IV of Haldol; that helped her calm down, and this morning she is alert, awake and oriented, and feels much stronger. I discussed the plan with the patient's daughter to discharge her to [facility] today and use Risperdal and Haldol as needed. Continue to Risperdal, and Haldol 0.5 mg only if the patient gets agitated.   The patient will benefit from further physical therapy and intensive rehab at [facility]. The same was discussed with the daughter and explained to her.   TIME SPENT ON DISCHARGE PREPARATION: More than 30 minutes.     DISCHARGE VITAL SIGNS: Temperature 97.6, blood pressure 128/75, heart rate  97, sats 98% on 2 liters.   Condition at this time is stable.   ____________________________ Epifanio Lesches, MD sk:dmm D: 10/20/2012 10:28:00 ET T: 10/20/2012 11:46:48 ET JOB#: [id number]  cc: Lenard Simmer, MD Park Breed, MD Isaias Cowman, MD [place] Lilian Kapur, MD Epifanio Lesches, MD, <Dictator> Epifanio Lesches MD ELECTRONICALLY SIGNED 11/07/2012 5:11

## 2014-06-01 NOTE — Consult Note (Signed)
PATIENT NAME:  Theresa Snyder, Theresa Snyder MR#:  [id number] DATE OF BIRTH:  18-Jun-1929  DATE OF CONSULTATION:  10/10/2012  REFERRING PHYSICIAN:  Fulton Reek, MD CONSULTING PHYSICIAN:  Allyne Gee, MD  REASON FOR CONSULTATION: Acute respiratory failure.  HISTORY OF PRESENT ILLNESS: An 81 year old female who was found unresponsive at home. She apparently had been having diarrhea prior to this. The patient presented to the Emergency Room and noted to have seizures and she was hypotensive in shock and because of the seizures and because of the hypotension and shock and protection of the airway she was intubated. The patient was noted also to be significantly metabolic acidotic. She is not able to provide any kind of history. There is no other history available.   PAST MEDICAL HISTORY: Significant for hyperlipidemia, hypothyroidism, hypertension, tubal ligation.   HOME MEDICATIONS: Aspirin, atenolol, Norvasc, Lotensin, hydrochlorothiazide, Synthroid and Pravachol.   ALLERGIES: IV CONTRAST.   SOCIAL HISTORY: Negative for tobacco or alcohol use.   FAMILY HISTORY: Unknown.   REVIEW OF SYSTEMS: She is not able to provide.   PHYSICAL EXAMINATION: GENERAL: At the time that she was evaluated she was orally intubated on the ventilator.  VITAL SIGNS: Temperature was 98.1, pulse 74, respiratory rate was 14 and blood pressure was 81/44. Sats were 100%.  NECK: Appeared to be supple. There was no JVD and no adenopathy.  HEENT: Extraocular movements could not be assessed. She was orally intubated.  LUNGS:  Chest showed coarse breath sounds, some rhonchi, no rales. Expansion was equal.  HEART: S1 and S2 is normal. Regular rhythm without any gallop.  ABDOMEN: Soft and nontender.  EXTREMITIES: Without cyanosis or clubbing. NEUROLOGIC: She was not able to be assessed.   LABORATORY AND DIAGNOSTICS: On admission ABG, her pH was 7.0, pCO2 was 24 and pO2 157. The blood gas that was done today shows more improvement  with pH of 7.33, pCO2 25 and pO2 of 127.   She did have an x-ray of the chest done and it showed possible low-grade heart failure and other than that there were no discrete infiltrates. Follow-up chest x-ray that was done today showed a large hiatal hernia and some chronic interstitial disease.   On the patient's other labs, white count 11.1, hemoglobin 11.6, hematocrit 33.6 and platelet count 121. The patient's sodium was 134, potassium 3.8, serum CO2 17 and glucose 185. CPK was 1808 and MB 22.4. Troponins were negative.   IMPRESSION: 1.  Acute respiratory failure.  2.  Severe metabolic acidosis.  3.  Probable rhabdomyolysis with increased CPK numbers noted. The patient's CPK on admission was 1700 and today it is a little bit increased at 1800.   PLAN: The patient needs to be continued on full vent support. Would adequately hydrate. Cardiology has seen the patient and do not feel that the CPKs are of cardiac origin. We will monitor renal function. The blood gas already shows some improvement. Would continue with full ventilation. Make further recommendations as necessary. ____________________________ Allyne Gee, MD sak:sb D: 10/10/2012 12:18:12 ET T: 10/10/2012 12:48:51 ET JOB#: [id number]  cc: Allyne Gee, MD, <Dictator> Allyne Gee MD ELECTRONICALLY SIGNED 11/01/2012 16:56

## 2014-06-01 NOTE — Consult Note (Signed)
Brief Consult Note: Diagnosis: delirium.   Comments: Psychiatry: Came by to do consult but patient was soound asleep. Seemed illogical to wake her up to try to assess agitation. Will revisit tomorrow. Chart reviewed and it appears Dr Franchot Mimes treated pt a few days ago and the risperdal has been tolerated. Would not change anything for now.  Electronic Signatures: Clapacs, Madie Reno (MD)  (Signed 10-Sep-14 20:24)  Authored: Brief Consult Note   Last Updated: 10-Sep-14 20:24 by Gonzella Lex (MD)

## 2014-06-01 NOTE — Consult Note (Signed)
PATIENT NAME:  Theresa Snyder, CLOER MR#:  [id number] DATE OF BIRTH:  Mar 21, 1929  DATE OF CONSULTATION:  11/22/2012  REFERRING PHYSICIAN:  Lisa Roca, MD CONSULTING PHYSICIAN:  Cordelia Pen. Gretel Acre, MD  HISTORY OF PRESENT ILLNESS: The patient is an 81 year old white female who was brought to the Emergency Room after the ambulance was called by her daughter. According to the initial evaluation, the patient's daughter called the ambulance as she reported that the patient was having a seizure at her home. The patient was interviewed in the ED room. She reported that she received a fall in August, and at that time that she started having seizures. She was evaluated by the neurologist, Dr. Manuella Ghazi, last month. He decided that she can use some equipment at home as she was living by herself in a trailer home. She reported that she was in [facility] for 3 weeks because she was getting rehabilitation.   However, after she improved, she was no longer able to live by herself and then she moved to her daughter's home. The patient reported that she was going to meet with her therapist yesterday and then she went to the equipment store with her daughter and bought a triangular walker. The patient feels that she was sitting at her daughter's home when her daughter called the ambulance as she thought that she was having a seizure.   The patient reported that she started having seizures late August but she does not remember how she actually feels when she is having a seizure.   She was started on medications by Dr. Manuella Ghazi. The patient reported that she just celebrated her 83st birthday on Saturday and had a family reunion. She is looking forward to seeing her great-granddaughter who is coming from [place], and she feels very excited about the same. She stated that she lives by herself in a mobile home and is able to control her emotions. She currently denied feeling depressed, anxious, and does not have any paranoia. She stated that  her memory is good.   Collateral information was obtained from the patient's daughter, [name], at [phone], who reported that the patient has episodes when she becomes very confused and starts having rambling thoughts. She reported that the patient has become agitated at times. She reported that these episodes occur in spurts. She reported that this has happened recently and she called the ambulance because of the same reason. She reported that she does not want the patient to come back home. She reported that the patient was seen by the neurologist and he did not start her on any psychotropic medication. She reported that she does not want the patient to come back and live with them.   PAST PSYCHIATRIC HISTORY: The patient has never seen a psychiatrist in the past and does not take any psychotropic medications. She does not have any history of suicide attempts.   PAST MEDICAL HISTORY: History of hypertension, status post seizures, hypothyroidism, hypercholesterolemia, history of large hiatal hernia.   ALLERGIES: IVP DYE.   CURRENT MEDICATIONS: Pravastatin 20 mg daily, levothyroxine 88 mcg daily, Keppra 500 mg b.i.d., hydrochlorothiazide 25 mg daily, benazepril 40 mg daily, amlodipine 5 mg daily, acetaminophen 1 tablet two times daily.   VITAL SIGNS: Temperature 97.9, pulse 59, respirations 22, blood pressure 144/72.   LABORATORIES: Glucose 93, BUN 23, creatinine 1.63, sodium 135, potassium 4.0, chloride 102, bicarbonate 23, anion gap 9, osmolality 273, calcium 9.4, protein 6.7, albumin 3.5, bilirubin 0.3, alkaline phosphatase 123, AST 24, ALT 24.  Urine drug screen negative.RBC 3.3, hemoglobin 10.6, hematocrit 30.6, MCV 93.   REVIEW OF SYSTEMS:  CONSTITUTIONAL: Denies any fever or chills. No weight changes.  EYES: No double or blurred vision.  RESPIRATORY: No shortness of breath or cough.  CARDIOVASCULAR: No chest pain or orthopnea.  GASTROINTESTINAL: No abdominal pain, nausea, vomiting or  diarrhea.  GENITOURINARY: No incontinence or frequency.  ENDOCRINE: No heat or cold intolerance.  LYMPHATIC: No anemia or easy bruising.  INTEGUMENTARY: No acne or rash.  MUSCULOSKELETAL: Some muscle and joint pain related to the recent fall in the right shoulder.   MENTAL STATUS EXAMINATION: The patient is an older-looking female who appeared her stated age. She was calm and cooperative. Her mood was fine. Affect was congruent. She was pleasant and cooperative and was able to relate the history in detail. Her memory appeared at her stated age level. Her recent and remote memory appeared intact. No cognitive deficits noted at this time. She does not appear to be hallucinating. No suicidal or homicidal ideations or plans are noted.   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  AXIS II: None.  AXIS III: Please review the medical history.   TREATMENT PLAN: I discussed the discharge plan at length with her daughter, [name], in detail, but she remains resistant, that she is unable to take care of her mother at length.   I discussed with her about the option for discussing the discharge plan with case management at the Emergency Department. The patient will be released from the involuntary commitment at this time as she does not meet the criteria. She will not be started on any psychotropic medications at this time. The patient can follow up in the outpatient psychiatry if needed.   Thank you for allowing me to participate in the care of this patient.    ____________________________ Cordelia Pen. Gretel Acre, MD usf:np D: 11/22/2012 16:10:01 ET T: 11/22/2012 17:49:08 ET JOB#: [id number]  cc: Cordelia Pen. Gretel Acre, MD, <Dictator> Jeronimo Norma MD ELECTRONICALLY SIGNED 11/29/2012 14:30

## 2014-06-01 NOTE — Consult Note (Signed)
PATIENT NAME:  Theresa Snyder, Theresa Snyder MR#:  [id number] DATE OF BIRTH:  1929-11-09  AGE:  81 years  SEX:  Female  RACE:  White  DATE OF DICTATION:  10/15/2012  PLACE OF DICTATION:  [facility], room #260, [place], [place]: 10/09/2012  DATE OF CONSULTATION:  10/15/2012  CONSULTING PHYSICIAN:  Axel Frisk K. Argenis Kumari, MD  SUBJECTIVE:  The patient was seen in consultation in room #260. 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 Divorced for many, many years, and lives by herself in a mobile home. The patient comes to [facility] and was admitted to ICU, where she was on a ventilator. She was confused and agitated when she pulled out her IV lines and needed to be calmed down with the help of Haldol. The patient had acute respiratory failure, and has been off ventilator and moved to a medical bed, where she was seen by the undersigned.   PAST PSYCHIATRIC HISTORY:  No previous history of inpatient hospital psychiatry. No history of suicide attempts. Not being followed by a psychiatrist. Never had a mental health problem so far. Alcohol and drugs denied. The patient reports that she works very hard and, in fact, she grows lots of vegetables, and she cans and gives them away as gifts. She took a retirement from her work to take care of her daughter's 3 children, who she raised until they were 79 years old, and is getting ready to take care of her son's baby, and always helped around.  MENTAL STATUS EXAMINATION:  The patient is seen lying comfortably in bed. Alert and oriented to place, person and time. She knew the [place], name of the current president. Her affect is neutral, mood stable.  Denies feeling depressed. She said that she has a good attitude towards life and she wants to get help, and she likes to get helped. No psychosis. Denies auditory or  visual hallucinations, delusions or paranoid ideas.  She could count money, and knew there are 4 quarters, 10 dimes, 20 nickels, 100 pennies in a dollar. Regarding memory and recall, she could remember 2 of the 3 objects and the third one with a little help and prompting. Denies feeling depressed. Denies feeling hopeless or helpless. Denies feeling worthless or useless. She realizes that she got confused, but currently she is very stable and wants to get better. Insight and judgment fair, for fire  she said " she would pull the fire alarm. "  IMPRESSION:  Psychosis secondary to medical illness - respiratory distress and failure, and atrial fibrillation.  RECOMMENDATION:  If the patient gets confused and agitated, give Ativan 1 mg and Haldol 1 mg p.o. or IM q.6 hours p.r.n. for agitation. No other medications needed at this time.    ____________________________ Wallace Cullens. Franchot Mimes, MD skc:mr D: 10/15/2012 17:01:46 ET T: 10/15/2012 18:28:04 ET JOB#: [id number]  cc: Arlyn Leak K. Franchot Mimes, MD, <Dictator> Dewain Penning MD ELECTRONICALLY SIGNED 10/16/2012 17:16

## 2014-06-01 NOTE — Consult Note (Signed)
PATIENT NAME:  Theresa Snyder, Theresa Snyder MR#:  [id number] DATE OF BIRTH:  1930/01/12  SURGICAL CONSULTATION   DATE OF CONSULTATION:  11/05/2012  CONSULTING PHYSICIAN:  Jerrol Banana. Burt Knack, MD  CHIEF COMPLAINT: Hiatal hernia.   HISTORY OF PRESENT ILLNESS: This is an 81 year old female patient who is demented, who possibly had a seizure in the nursing home, who was transferred to the hospital for evaluation of altered mental status. She has a known hiatal hernia seen on CT scan that I was asked to evaluate for possible surgical intervention. I am unable to obtain any history from the patient. She answers yes to every question and is not able to give me coherent answers. History is obtained from the chart.   PAST MEDICAL HISTORY: Hypertension, hyperlipidemia, dementia.   PAST SURGICAL HISTORY: Tubal ligation.   SOCIAL HISTORY: From the chart, no alcohol or tobacco use.   FAMILY HISTORY: Stroke.   REVIEW OF SYSTEMS: Not obtainable.   MEDICATIONS: Multiple, see chart.   PHYSICAL EXAMINATION:  GENERAL: Demented-appearing Caucasian female patient.  HEENT: Shows no scleral icterus.  NECK: No palpable neck nodes.  CHEST: Clear to auscultation.  CARDIAC: Regular rate and rhythm.  ABDOMEN: Soft, nontender.  EXTREMITIES: Show moderate edema.  NEUROLOGIC: Grossly intact, but she is confused and/or demented.  INTEGUMENT: Shows no jaundice.   IMAGING: CT scan is personally reviewed, showing a hiatal hernia within the chest, without stranding to suggest inflammatory process  LABORATORY VALUES: Reviewed in total.   ASSESSMENT AND PLAN: This is a patient with a hiatal hernia, probably long-standing. While there is a question of her possibly aspirating, with her multiple medical problems, she is not a surgical candidate for repair of a large hiatal hernia. The morbidity and mortality associated with that operation would far outweigh any improvement in her present lifestyle. I will be happy to be available if  needed, but will sign off at this time as there are no surgical indications in this patient.   ____________________________ Jerrol Banana. Burt Knack, MD rec:OSi D: 11/05/2012 08:20:56 ET T: 11/05/2012 09:44:54 ET JOB#: [id number]  cc: Jerrol Banana. Burt Knack, MD, <Dictator> Florene Glen MD ELECTRONICALLY SIGNED 11/05/2012 16:41

## 2014-06-01 NOTE — Consult Note (Signed)
Brief Consult Note: Diagnosis: Elevated CPK/MB, nonspecific, noncardiac, neg troponin, no MI.   Patient was seen by consultant.   Consult note dictated.   Comments: REC  Agree with current therapy, defer full dose anticoagulation, defer further cardiac diagnostics.  Electronic Signatures: Isaias Cowman (MD)  (Signed 01-Sep-14 08:49)  Authored: Brief Consult Note   Last Updated: 01-Sep-14 08:49 by Isaias Cowman (MD)

## 2014-06-01 NOTE — Consult Note (Signed)
Brief Consult Note: Diagnosis: Right scapula fracture.   Patient was seen by consultant.   Consult note dictated.   Comments: Sling for comfort. PT for ROM exercises.  Electronic Signatures: Claud Kelp (MD)  (Signed 07-Sep-14 15:55)  Authored: Brief Consult Note   Last Updated: 07-Sep-14 15:55 by Claud Kelp (MD)

## 2014-06-01 NOTE — H&P (Signed)
PATIENT NAME:  Theresa Snyder, COVINGTON MR#:  [id number] DATE OF BIRTH:  [birth year]  DATE OF ADMISSION:  11/04/2012  REFERRING PHYSICIAN:  Dr. Carrie Mew.  PRIMARY CARE PHYSICIAN:  [facility].   REASON FOR ADMISSION:  Altered mental status.   HISTORY OF PRESENT ILLNESS:  This is an 81 year old female with history of hypertension, hyperlipidemia, hypothyroidism, the patient recently discharged on September 12 from [facility] due to respiratory failure and septic shock, the patient was discharged to a skilled nursing facility, the patient presents today with altered mental status, daughter at bedside and she gives most of the history, her mother had improved mental status and functional capacity at the nursing home, she was doing okay with physical therapy, the daughter reports the patient had good days and bad days of mentation, but usually she was communicative and verbal, but today the patient had worsening mental status where she was unresponsive initially, there was some documentation from the nursing home of possible focal seizures, where the patient was brought to the ED, there was no evidence of any tongue-biting, the patient was extremely confused when she came, but currently she is more awake, the patient's CT brain did not show any acute finding, the patient was found to have leukocytosis at 22,000, there is no fever or chills, the patient had CT chest, abdomen and pelvis, which did show mild bilateral pleural effusion and atelectasis  and questionable infiltrate as well, abdominal finding on the CAT scan were only significant for adnexal mass, and hiatal hernia with part of the stomach as well, hospitalist service were requested to admit the patient for further work-up, as well the patient had worsening creatinine at 1.5.   PAST MEDICAL HISTORY: 1.  Benign hypertension.  2.  Hyperlipidemia.  3.  Hypothyroidism.  4.  Previous leg trauma.  5.  Status post bilateral tubal  ligation.   ALLERGIES:  IV DYE.   SOCIAL HISTORY:  Negative for alcohol or tobacco abuse.   FAMILY HISTORY:  Significant for hypertension and CVA.   REVIEW OF SYSTEMS:  Unable to obtain as the patient is confused.   HOME MEDICATIONS: 1.  Aspirin 325 mg oral daily.  2.  Diltiazem 240 mg oral daily.  3.  Pravastatin 20 mg oral daily.  4.  Haloperidol 0.5 mg oral daily as needed for agitation.  5.  Risperidone 0.5 mg 1 tablet 2 times a day.  6.  Levothyroxine 25 mcg oral daily.  7.  Oxybutynin 5 mg oral daily.   PHYSICAL EXAMINATION: VITAL SIGNS:  Temperature 97.6, pulse 96, respiratory rate 20, blood pressure 135/57, saturating 91% on room air.  GENERAL:  Frail, elderly, chronically ill-appearing female, lies comfortable in bed in no apparent distress.  HEENT:  Head atraumatic, normocephalic.  Pupils equal, reactive to light.  Pink conjunctivae.  Anicteric sclerae.  Moist oral mucosa.  NECK:  Supple.  No thyromegaly.  No JVD.  LUNGS:  The patient had good air entry bilaterally.  No wheezing, rales, rhonchi.  CARDIOVASCULAR:  S1, S2 heard.  No rubs, murmur or gallops.  Regular rate and rhythm.  ABDOMEN:  Soft, nontender, nondistended.  Bowel sounds present.  No organomegaly.  EXTREMITIES:  No clubbing.  No cyanosis.  No edema.  Pulses +1 bilaterally.  SKIN:  Warm and dry.  No rash.  PSYCHIATRIC:  The patient is awake, alert x 1 to 2.  NEUROLOGIC:  Cranial nerves grossly intact.   MOTOR:  Appears to be moving all extremities without significant  deficit.    PERTINENT LABORATORY AND DIAGNOSTICS:  Glucose 106, BUN 23, creatinine 1.51, sodium 134, potassium 3.2, chloride 98, CO2 28, ALT 31, AST 25, alk phos 180, total bili 0.5.  Troponin less than 0.02.  TSH 5.37.  Free thyroxine 1.42.  White blood cells 22.3, hemoglobin 11.3, hematocrit 33.3, platelets 291.  EKG showing normal sinus rhythm at 90 beats per minute.  Urinalysis is negative.    IMAGING STUDIES:  CT head without contrast  showing atrophy with chronic microvascular ischemic disease.  No acute intracranial abnormality.  CT chest, abdomen and pelvis without contrast showing hiatal hernia with a larger part of the stomach being intrathoracic and there is some pleural effusion bilaterally with left lung base atelectasis or minimal infiltrate and patchy ground-glass area of density in both lungs, which is nonspecific.  No acute abdominal or pelvic abnormality, right adnexal cystic mass is present.  Gynecologic follow-up is suggested with follow-up pelvic ultrasound in eight weeks to assess any interval changes.   ASSESSMENT AND PLAN: 1.  Altered mental status, at this point etiology is unclear, is possibly due to seizures, as nursing home reported focal seizures, we will consult neurology service, meanwhile, we will keep her on seizure precautions and as needed Ativan, as well there is possible metabolic encephalopathy, possible sepsis, at this point her white blood cell count is 22,000, etiology is unclear, but as well we will check ammonia level.  As well, the patient will be kept nothing by mouth and will have swallow service see her in the morning. 2.  Leukocytosis.  This might be reactive versus infection, we will check procalcitonin level, the patient will be started on vancomycin and Zosyn for possible pneumonia as she might be having possible infiltrate, as well she is being treated by urinary tract infection at the nursing home, a repeat urinalysis is negative.  3.  Hypothyroidism.  The patient's TSH is mildly elevated, but her T4 level is within normal limits.  I will not adjust her dosage.  We will repeat TSH when this patient is not in acute distress as an outpatient.  4.  Hypertension.  We will resume her home meds once she passes swallow evaluation.  5.  Hyperlipidemia.  Resume statin when she passes the swallow evaluation.   6.  Incidental finding on her CT abdomen and pelvis, we will consult surgical service to  evaluate for her hiatal hernia and the patient need to follow with GYN service and have repeat ultrasound in eight weeks after discharge.  7.  DVT prophylaxis.  SubQ heparin.  8.  CODE STATUS:  THE PATIENT IS FULL CODE, as discussed with the daughter.   Total time spent on admission and patient care 55 minutes.    ____________________________ Albertine Patricia, MD dse:ea D: 11/04/2012 02:39:45 ET T: 11/04/2012 03:36:30 ET JOB#: [id number]  cc: Albertine Patricia, MD, <Dictator> Laquesha Holcomb Graciela Husbands MD ELECTRONICALLY SIGNED 11/05/2012 5:56

## 2014-06-01 NOTE — Discharge Summary (Signed)
PATIENT NAME:  Theresa Snyder, Theresa Snyder MR#:  [id number] DATE OF BIRTH:  09-24-1929  DATE OF ADMISSION:  10/09/2012 DATE OF DISCHARGE:  10/21/2012  The patient's discharge was canceled yesterday because of renal failure. The patient's creatinine has increased to 1.47, BUN 24. Started on IV fluids, normal saline at 40 mL/h and today, the creatinine is 1.24 and she feels much better.   The above discharge summary remains the same, and discharge medications are also reviewed again today. The patient's medications are same as yesterday: Aspirin 325 mg p.o. daily, Risperdal 0.5 mg p.o. b.i.d. pravastatin 20 mg p.o. daily, Cardizem CD 240 mg p.o. daily. The patient was taking Norvasc, atenolol. They were stopped. The patient also was taking HCTZ,  which is also stopped. She is on oxybutynin for overactive bladder and hypothyroid medication, levothyroxine.   TIME SPENT ON DISCHARGE SUMMARY: More than 30 minutes, including yesterday.   ____________________________ Epifanio Lesches, MD sk:jm D: 10/21/2012 12:01:12 ET T: 10/21/2012 22:07:43 ET JOB#: [id number]  cc: Epifanio Lesches, MD, <Dictator> Epifanio Lesches MD ELECTRONICALLY SIGNED 11/07/2012 5:11

## 2014-06-01 NOTE — Consult Note (Signed)
PATIENT NAME:  Theresa Snyder, COLIN MR#:  [id number] DATE OF BIRTH:  [birth year]  DATE OF CONSULTATION:  10/10/2012  REFERRING PHYSICIAN:   CONSULTING PHYSICIAN:  Leotis Pain, MD  REASON FOR CONSULTATION: Seizure activity.  HISTORY OF PRESENT ILLNESS: This is an 81 year old female with past medical history of hypertension, hyperlipidemia and history of hypothyroidism presenting status being found down by family. The patient has had diarrhea for 3 to 4 days prior to arrival and she was not seen by her family for about 24 hours. Found down unresponsive. On admission found to be acidotic with a pH of 7.0, hypotensive and had to be started on pressors. The patient was intubated and started on dopamine drip. While in the Emergency Department, she was found to have generalized tonic-clonic seizure. On admission the patient was found to have an acute renal failure with elevated creatinine of 3.96, hyponatremia with a sodium of 1.26, acidosis with a pH of 7.0, hypomagnesemia with mag of 1.1. She had elevated CK.  PAST MEDICAL HISTORY:  1.  Benign hypertension. 2.  Hyperlipidemia. 3.  Hypothyroidism. 4.  Previous leg trauma.  5.  Status post bilateral tubal ligation.   HOME MEDICATIONS: Include Pravachol, Synthroid, hydrochlorothiazide, Lotensin, Norvasc, atenolol and aspirin.   ALLERGIES: INCLUDE IVP DYE.   SOCIAL HISTORY: Negative for alcohol, tobacco use.   FAMILY HISTORY: Positive for hypertension and stroke.   REVIEW OF SYSTEMS:  Unable to assess as the patient is intubated.  LABORATORY AND DIAGNOSTICS: Includes an ABG with pH 7.33, pCO2 25, pO2 125, FiO2 30% and bicarb is 13.2.  White blood cell count is 11.1, red blood cells 3.69, hemoglobin 11.6, hematocrit 33.6 and platelet count 121. The patient's glucose is 185, BUN 44, creatinine is much improved from yesterday at 2.7, sodium 134, potassium 3.8, chloride 108, AST 130 and ALT 44. INR is 1.2. CK total trending down and is 1808. Magnesium is  1.1.   CT of the head done showed generalized atrophy. No acute intracranial abnormality.  PHYSICAL EXAMINATION: The patient's vital signs include a blood pressure of 193/49, respirations 14, pulse 72 and pulse ox is 100%.  The patient is intubated, but she is able to follow simple commands, able to open her eyes to verbal commands, able to squeeze my fingers bilaterally, able to push. Appears to be not having any focal motor deficits in either upper or lower extremities. Able to wiggle her toes symmetrically. Coordination could not be assessed. Gait could not be assessed. Sensation appears to be intact throughout.   IMPRESSION: This is an 81 year old female status post found down by her family members, found to have an acute renal failure, acidotic, hyponatremic with elevated CK status post intubation, hydration and on sedation with Precedex and dopamine as a pressor. Mental status much improved compared to yesterday. The patient reportedly had seizure activity in the Emergency Department that self resolved. Currently not on any antiepileptic medications.   PLAN: At this point, since I believe the patient's mental status is significantly improved and I do not believe she has baseline history of seizures, would not treat her with any antiepileptic medications. I believe her mental status is good enough to evaluate for possibility of extubation later today or tomorrow morning. If mental status continues to improve, I would not order an EEG. Magnesium is 1.1. Please replace magnesium. Keep level at least above 2.0. It was a pleasure seeing this patient. This case was discussed with Dr. Bridgett Larsson. ____________________________ Leotis Pain, MD yz:sb D: 10/10/2012 12:28:50  ET T: 10/10/2012 12:56:32 ET JOB#: [id number]  cc: Leotis Pain, MD, <Dictator> Leotis Pain MD ELECTRONICALLY SIGNED 10/24/2012 12:12

## 2014-06-01 NOTE — Consult Note (Signed)
PATIENT NAME:  Theresa Snyder, Theresa Snyder MR#:  [id number] DATE OF BIRTH:  1930/01/09  DATE OF CONSULTATION:  10/16/2012  REFERRING PHYSICIAN:   CONSULTING PHYSICIAN:  Claud Kelp, MD  HISTORY OF PRESENT ILLNESS: Ms. Vari is an 81 year old female with multiple medical issues to include hypertension, hyperlipidemia and hypothyroidism, who was found unresponsive at her home approximately 1 week ago after a 4-day history of severe diarrhea and was brought to hospital, admitted to the ICU and intubated that time. She was found to be in metabolic acidosis as well as an acute respiratory and renal failure. Of note, she also has a history of seizure disorder. During her hospital course she has also been worked up for possible congestive heart failure as well. As her hospital course has improved, she was noted to have a large area of ecchymosis over her right posterior shoulder scapular region and was complaining of some shoulder pain. Orthopedics was consulted for further evaluation.   PAST MEDICAL HISTORY: As above.   MEDICATIONS UPON ADMISSION: Pravachol, Synthroid, hydrochlorothiazide, Lotensin, Norvasc, atenolol and aspirin.   REVIEW OF SYSTEMS: Noncontributory.   SOCIAL HISTORY: She denies alcohol or tobacco use.   PHYSICAL EXAMINATION: The patient is sitting upright at the side of the bed with the physical therapist. She is alert and oriented, and responding appropriately to questions. She denies any traumatic event, fall, automobile accident or anything to explain the trauma to her left shoulder. She does not recall anything prior to being brought into the hospital 1 week ago. She does have a large ecchymotic area over her right scapula but she has no pain with gentle shoulder range of motion. She is neurovascularly intact distally in all distributions to the right upper extremity and has palpable distal pulses.   IMAGING:  Radiographs of the shoulder were obtained which demonstrate what appears to be a  scapular body fracture with no obvious extension into the glenoid.   ASSESSMENT: Right scapular body fracture of unknown etiology.   PLAN: This patient is an 81 year old female with multiple medical comorbidities and recommend nonoperative management for this fracture to include a sling for comfort and PT for gentle range of motion exercises. As her clinical condition stabilizes and she improves, if she continues to have shoulder pain she may benefit from a CT scan to further characterize this fracture, however, even if there is intra-articular extension, I think given her medical comorbidities and her age she would be better off managing glenohumeral arthritis as opposed to undergoing a major scapular ORIF.     ____________________________ Claud Kelp, MD tte:cs D: 10/16/2012 15:35:16 ET T: 10/16/2012 15:54:46 ET JOB#: [id number]  cc: Claud Kelp, MD, <Dictator> Claud Kelp MD ELECTRONICALLY SIGNED 10/17/2012 4:09

## 2014-12-04 ENCOUNTER — Other Ambulatory Visit: Payer: Self-pay | Admitting: Family Medicine

## 2014-12-04 DIAGNOSIS — M810 Age-related osteoporosis without current pathological fracture: Secondary | ICD-10-CM

## 2014-12-20 ENCOUNTER — Ambulatory Visit
Admission: RE | Admit: 2014-12-20 | Discharge: 2014-12-20 | Disposition: A | Discharge status: Home / Self Care | Payer: Medicare Other | Source: Ambulatory Visit | Attending: Family Medicine | Admitting: Family Medicine

## 2014-12-20 DIAGNOSIS — M8580 Other specified disorders of bone density and structure, unspecified site: Secondary | ICD-10-CM | POA: Insufficient documentation

## 2014-12-20 DIAGNOSIS — M810 Age-related osteoporosis without current pathological fracture: Secondary | ICD-10-CM

## 2014-12-20 DIAGNOSIS — Z13820 Encounter for screening for osteoporosis: Secondary | ICD-10-CM | POA: Insufficient documentation

## 2015-03-04 ENCOUNTER — Ambulatory Visit (INDEPENDENT_AMBULATORY_CARE_PROVIDER_SITE_OTHER): Payer: Medicare Other

## 2015-03-04 ENCOUNTER — Encounter: Payer: Self-pay | Admitting: Podiatry

## 2015-03-04 ENCOUNTER — Ambulatory Visit (INDEPENDENT_AMBULATORY_CARE_PROVIDER_SITE_OTHER): Payer: Medicare Other | Admitting: Podiatry

## 2015-03-04 VITALS — BP 131/77 | HR 77 | Resp 16

## 2015-03-04 DIAGNOSIS — I890 Lymphedema, not elsewhere classified: Secondary | ICD-10-CM

## 2015-03-04 DIAGNOSIS — N189 Chronic kidney disease, unspecified: Secondary | ICD-10-CM | POA: Insufficient documentation

## 2015-03-04 DIAGNOSIS — M779 Enthesopathy, unspecified: Secondary | ICD-10-CM | POA: Diagnosis not present

## 2015-03-04 DIAGNOSIS — M79673 Pain in unspecified foot: Secondary | ICD-10-CM

## 2015-03-04 DIAGNOSIS — K589 Irritable bowel syndrome without diarrhea: Secondary | ICD-10-CM | POA: Insufficient documentation

## 2015-03-04 NOTE — Progress Notes (Signed)
   Subjective:    Patient ID: Theresa Snyder, female    DOB: 1929/02/27, 82 y.o.   MRN: [id number]  HPI : 82 year old presents with her daughter today. She is complaining of bilateral leg and foot pain left greater than right with pain along the medial longitudinal arch of the left foot. States this has been going on for the past couple weeks and seems to be getting worse. She states that she has swelling in the day which goes down  At night time. She wakes in the morning with much decrease in the swelling. She has visited her PCP and was told that she has circulatory problem. She states that she does not like that answer we'll like for [place] to evaluate her. She states that the medial aspect left foot is approximately 80% improved.    Review of Systems  Constitutional: Positive for activity change.  HENT: Positive for sinus pressure.   Eyes: Positive for visual disturbance.  Respiratory: Positive for cough.   Endocrine: Positive for cold intolerance and heat intolerance.  Genitourinary: Positive for urgency and frequency.  Neurological: Positive for dizziness.  Hematological: Bruises/bleeds easily.  All other systems reviewed and are negative.      Objective:   Physical Exam : 82 year old female no apparent distress vital signs stable alert and oriented 3. Pulses are strongly palpable capillary fill time is immediate. Venous insufficiency with edema pitting in nature bilateral lower extremity. This is painful on palpation. Neurologic sensorium is intact persons once the monofilament. Deep tendon reflexes are intact bilaterally muscle strength +5 over 5 dorsiflex plantar flexors and inverters and evertors on physical musculature is intact. Orthopedic evaluation was resolved joints distal to the ankle level for range of motion without crepitation. She has pain on palpation posterior tibial tendon at its insertion site on the navicular  Bone. Cutaneous evaluation and x-rays no open lesions or  wounds.  radiographs do not demonstrate any type of osseus abnormalities.       Assessment & Plan:   assessment: posterior tibial tendinitis left. This appears to be resolving on its own. Venous insufficiency bilateral edema.   Plan: at this point I recommended that she visit [facility]. We will order a consult for her. I also recommended that she keep her feet and legs elevated as much as she can. Cannot rule out amlodipine as a causative agent as well.

## 2015-03-05 ENCOUNTER — Telehealth: Payer: Self-pay | Admitting: *Deleted

## 2015-03-05 DIAGNOSIS — R609 Edema, unspecified: Secondary | ICD-10-CM

## 2015-03-05 NOTE — Telephone Encounter (Signed)
Entered in error

## 2015-09-02 ENCOUNTER — Encounter: Payer: Self-pay | Admitting: Podiatry

## 2015-09-02 ENCOUNTER — Ambulatory Visit (INDEPENDENT_AMBULATORY_CARE_PROVIDER_SITE_OTHER): Payer: Medicare Other | Admitting: Podiatry

## 2015-09-02 DIAGNOSIS — M76829 Posterior tibial tendinitis, unspecified leg: Secondary | ICD-10-CM

## 2015-09-02 DIAGNOSIS — M6789 Other specified disorders of synovium and tendon, multiple sites: Secondary | ICD-10-CM | POA: Diagnosis not present

## 2015-09-02 NOTE — Progress Notes (Signed)
She presents today for follow-up of her posterior tibial tendon dysfunction of her left foot. She states it seems to be doing better than it has previously. Vascular surgery states that she has venous insufficiency and placed her in compression hose.  Objective: She has pain on palpation of the posterior tibial tendon at its insertion site of her left foot. The majority of the pain is just proximal to the navicular tuberosity.  Assessment: Posterior tibial tendinitis left.  Plan: I injected the area today with dexamethasone and local anesthetic follow-up with me as needed.  Roselind Messier DPM

## 2015-09-17 MED ORDER — ONDANSETRON HCL 4 MG/2ML IJ SOLN
INTRAMUSCULAR | Status: AC
  Filled 2015-09-17: qty 2

## 2015-09-24 ENCOUNTER — Encounter: Payer: Self-pay | Admitting: *Deleted

## 2015-09-24 NOTE — H&P (Signed)
See scanned note.

## 2015-09-25 ENCOUNTER — Encounter: Payer: Self-pay | Admitting: *Deleted

## 2015-09-25 ENCOUNTER — Ambulatory Visit
Admission: RE | Admit: 2015-09-25 | Discharge: 2015-09-25 | Disposition: A | Discharge status: Home / Self Care | Payer: Medicare Other | Source: Ambulatory Visit | Attending: Ophthalmology | Admitting: Ophthalmology

## 2015-09-25 ENCOUNTER — Ambulatory Visit: Payer: Medicare Other | Admitting: Anesthesiology

## 2015-09-25 ENCOUNTER — Encounter
Admission: RE | Disposition: A | Discharge status: Home / Self Care | Payer: Self-pay | Source: Ambulatory Visit | Attending: Ophthalmology

## 2015-09-25 DIAGNOSIS — E7800 Pure hypercholesterolemia, unspecified: Secondary | ICD-10-CM | POA: Diagnosis not present

## 2015-09-25 DIAGNOSIS — Z85828 Personal history of other malignant neoplasm of skin: Secondary | ICD-10-CM | POA: Diagnosis not present

## 2015-09-25 DIAGNOSIS — H2512 Age-related nuclear cataract, left eye: Secondary | ICD-10-CM | POA: Insufficient documentation

## 2015-09-25 DIAGNOSIS — M4800 Spinal stenosis, site unspecified: Secondary | ICD-10-CM | POA: Insufficient documentation

## 2015-09-25 DIAGNOSIS — E039 Hypothyroidism, unspecified: Secondary | ICD-10-CM | POA: Insufficient documentation

## 2015-09-25 DIAGNOSIS — I10 Essential (primary) hypertension: Secondary | ICD-10-CM | POA: Diagnosis not present

## 2015-09-25 HISTORY — DX: Spinal stenosis, site unspecified: M48.00

## 2015-09-25 HISTORY — DX: Chronic kidney disease, unspecified: N18.9

## 2015-09-25 HISTORY — DX: Pneumonia, unspecified organism: J18.9

## 2015-09-25 HISTORY — DX: Essential (primary) hypertension: I10

## 2015-09-25 HISTORY — DX: Lymphedema, not elsewhere classified: I89.0

## 2015-09-25 HISTORY — DX: Unspecified convulsions: R56.9

## 2015-09-25 HISTORY — DX: Hypothyroidism, unspecified: E03.9

## 2015-09-25 HISTORY — PX: CATARACT EXTRACTION W/PHACO: SHX586

## 2015-09-25 HISTORY — DX: Pure hypercholesterolemia, unspecified: E78.00

## 2015-09-25 HISTORY — DX: Malignant (primary) neoplasm, unspecified: C80.1

## 2015-09-25 SURGERY — PHACOEMULSIFICATION, CATARACT, WITH IOL INSERTION
Anesthesia: Monitor Anesthesia Care | Site: Eye | Laterality: Left | Wound class: Clean

## 2015-09-25 MED ORDER — MOXIFLOXACIN HCL 0.5 % OP SOLN: 1.0000 [drp] | OPHTHALMIC | Status: AC

## 2015-09-25 MED ORDER — PHENYLEPHRINE HCL 10 % OP SOLN: 1.0000 [drp] | OPHTHALMIC | Status: AC

## 2015-09-25 MED ORDER — LIDOCAINE HCL (PF) 4 % IJ SOLN
INTRAMUSCULAR | Status: DC | PRN
  Administered 2015-09-25: .5 mL via OPHTHALMIC

## 2015-09-25 MED ORDER — CYCLOPENTOLATE HCL 2 % OP SOLN
OPHTHALMIC | Status: AC
  Filled 2015-09-25: qty 2

## 2015-09-25 MED ORDER — NA CHONDROIT SULF-NA HYALURON 40-17 MG/ML IO SOLN
INTRAOCULAR | Status: DC | PRN
  Administered 2015-09-25: 1 mL via INTRAOCULAR

## 2015-09-25 MED ORDER — LIDOCAINE HCL (PF) 4 % IJ SOLN
INTRAMUSCULAR | Status: AC
  Filled 2015-09-25: qty 5

## 2015-09-25 MED ORDER — MOXIFLOXACIN HCL 0.5 % OP SOLN
1.0000 [drp] | OPHTHALMIC | Status: AC
  Administered 2015-09-25 (×3): 1 [drp] via OPHTHALMIC

## 2015-09-25 MED ORDER — MOXIFLOXACIN HCL 0.5 % OP SOLN
OPHTHALMIC | Status: DC | PRN
  Administered 2015-09-25: 1 [drp] via OPHTHALMIC

## 2015-09-25 MED ORDER — POVIDONE-IODINE 5 % OP SOLN
OPHTHALMIC | Status: DC | PRN
  Administered 2015-09-25: 1 via OPHTHALMIC

## 2015-09-25 MED ORDER — SODIUM CHLORIDE 0.9 % IV SOLN
INTRAVENOUS | Status: DC
  Administered 2015-09-25: 07:00:00 via INTRAVENOUS

## 2015-09-25 MED ORDER — LIDOCAINE HCL (PF) 4 % IJ SOLN
INTRAMUSCULAR | Status: DC | PRN
  Administered 2015-09-25: 4 mL via OPHTHALMIC

## 2015-09-25 MED ORDER — CYCLOPENTOLATE HCL 2 % OP SOLN
1.0000 [drp] | OPHTHALMIC | Status: AC
  Administered 2015-09-25 (×4): 1 [drp] via OPHTHALMIC

## 2015-09-25 MED ORDER — MOXIFLOXACIN HCL 0.5 % OP SOLN
OPHTHALMIC | Status: AC
  Filled 2015-09-25: qty 3

## 2015-09-25 MED ORDER — CARBACHOL 0.01 % IO SOLN
INTRAOCULAR | Status: DC | PRN
  Administered 2015-09-25: .5 mL via INTRAOCULAR

## 2015-09-25 MED ORDER — CEFUROXIME OPHTHALMIC INJECTION 1 MG/0.1 ML
INJECTION | OPHTHALMIC | Status: DC | PRN
  Administered 2015-09-25: 0.1 mL via INTRACAMERAL

## 2015-09-25 MED ORDER — TETRACAINE HCL 0.5 % OP SOLN
OPHTHALMIC | Status: DC | PRN
  Administered 2015-09-25: 1 [drp] via OPHTHALMIC

## 2015-09-25 MED ORDER — CEFUROXIME OPHTHALMIC INJECTION 1 MG/0.1 ML
INJECTION | OPHTHALMIC | Status: AC
  Filled 2015-09-25: qty 0.1

## 2015-09-25 MED ORDER — EPINEPHRINE HCL 1 MG/ML IJ SOLN
INTRAMUSCULAR | Status: AC
  Filled 2015-09-25: qty 2

## 2015-09-25 MED ORDER — BUPIVACAINE HCL (PF) 0.75 % IJ SOLN
INTRAMUSCULAR | Status: AC
  Filled 2015-09-25: qty 10

## 2015-09-25 MED ORDER — PHENYLEPHRINE HCL 10 % OP SOLN
OPHTHALMIC | Status: AC
  Filled 2015-09-25: qty 5

## 2015-09-25 MED ORDER — POVIDONE-IODINE 5 % OP SOLN
OPHTHALMIC | Status: AC
  Filled 2015-09-25: qty 30

## 2015-09-25 MED ORDER — CYCLOPENTOLATE HCL 2 % OP SOLN: 1.0000 [drp] | OPHTHALMIC | Status: DC

## 2015-09-25 MED ORDER — ALFENTANIL 500 MCG/ML IJ INJ
INJECTION | INTRAMUSCULAR | Status: DC | PRN
  Administered 2015-09-25: 125 ug via INTRAVENOUS

## 2015-09-25 MED ORDER — HYALURONIDASE HUMAN 150 UNIT/ML IJ SOLN
INTRAMUSCULAR | Status: AC
  Filled 2015-09-25: qty 1

## 2015-09-25 MED ORDER — NA CHONDROIT SULF-NA HYALURON 40-17 MG/ML IO SOLN
INTRAOCULAR | Status: AC
  Filled 2015-09-25: qty 1

## 2015-09-25 MED ORDER — PROPOFOL 10 MG/ML IV BOLUS
INTRAVENOUS | Status: DC | PRN
  Administered 2015-09-25: 20 mg via INTRAVENOUS
  Administered 2015-09-25: 10 mg via INTRAVENOUS

## 2015-09-25 MED ORDER — TETRACAINE HCL 0.5 % OP SOLN
OPHTHALMIC | Status: AC
  Filled 2015-09-25: qty 2

## 2015-09-25 MED ORDER — EPINEPHRINE HCL 1 MG/ML IJ SOLN
INTRAOCULAR | Status: DC | PRN
  Administered 2015-09-25: 1 mL via OPHTHALMIC

## 2015-09-25 MED ORDER — PHENYLEPHRINE HCL 10 % OP SOLN
1.0000 [drp] | OPHTHALMIC | Status: AC
  Administered 2015-09-25 (×4): 1 [drp] via OPHTHALMIC

## 2015-09-25 SURGICAL SUPPLY — 29 items

## 2015-09-25 NOTE — Anesthesia Procedure Notes (Signed)
Date/Time: 09/25/2015 7:34 AM Performed by: Kennon Holter Pre-anesthesia Checklist: Timeout performed, Patient being monitored, Suction available, Patient identified and Emergency Drugs available Patient Re-evaluated:Patient Re-evaluated prior to inductionOxygen Delivery Method: Nasal cannula Preoxygenation: Pre-oxygenation with 100% oxygen Intubation Type: IV induction Placement Confirmation: positive ETCO2

## 2015-09-25 NOTE — Anesthesia Postprocedure Evaluation (Signed)
Anesthesia Post Note  Patient: Theresa Snyder  Procedure(s) Performed: Procedure(s) (LRB): CATARACT EXTRACTION PHACO AND INTRAOCULAR LENS PLACEMENT (IOC) (Left)  Patient location during evaluation: PACU Anesthesia Type: MAC Level of consciousness: awake and alert and oriented Pain management: pain level controlled Vital Signs Assessment: post-procedure vital signs reviewed and stable Respiratory status: spontaneous breathing, nonlabored ventilation and respiratory function stable Cardiovascular status: stable and blood pressure returned to baseline Anesthetic complications: no    Last Vitals:  Vitals:   09/25/15 0810 09/25/15 0813  BP: 138/60   Pulse: 65   Resp: 16   Temp: 36.2 C 36.2 C    Last Pain:  Vitals:   09/25/15 0813  TempSrc: Oral                 Nadyne Gariepy

## 2015-09-25 NOTE — Progress Notes (Signed)
IV site clean and dry

## 2015-09-25 NOTE — Discharge Instructions (Addendum)
Eye Surgery Discharge Instructions  Expect mild scratchy sensation or mild soreness. DO NOT RUB YOUR EYE!  The day of surgery:  Minimal physical activity, but bed rest is not required  No reading, computer work, or close hand work  No bending, lifting, or straining.  May watch TV  For 24 hours:  No driving, legal decisions, or alcoholic beverages  Safety precautions  Eat anything you prefer: It is better to start with liquids, then soup then solid foods.  _____ Eye patch should be worn until postoperative exam tomorrow.  ____ Solar shield eyeglasses should be worn for comfort in the sunlight/patch while sleeping  Resume all regular medications including aspirin or Coumadin if these were discontinued prior to surgery. You may shower, bathe, shave, or wash your hair. Tylenol may be taken for mild discomfort.  Call your doctor if you experience significant pain, nausea, or vomiting, fever > 101 or other signs of infection. [phone] or [phone] Specific instructions:  Follow-up Information    DINGELDEIN,STEVEN, MD .   Specialty:  Ophthalmology Why:  09-26-15 at 9:45 Contact information: [place] [address] [phone]        Edmonia James, NP .   Contact information: [place] [phone]          Eye Surgery Discharge Instructions  Expect mild scratchy sensation or mild soreness. DO NOT RUB YOUR EYE!  The day of surgery:  Minimal physical activity, but bed rest is not required  No reading, computer work, or close hand work  No bending, lifting, or straining.  May watch TV  For 24 hours:  No driving, legal decisions, or alcoholic beverages  Safety precautions  Eat anything you prefer: It is better to start with liquids, then soup then solid foods.  _____ Eye patch should be worn until postoperative exam tomorrow.  ____ Solar shield eyeglasses should be worn for comfort in the  sunlight/patch while sleeping  Resume all regular medications including aspirin or Coumadin if these were discontinued prior to surgery. You may shower, bathe, shave, or wash your hair. Tylenol may be taken for mild discomfort.  Call your doctor if you experience significant pain, nausea, or vomiting, fever > 101 or other signs of infection. [phone] or [phone] Specific instructions:  Follow-up Information    DINGELDEIN,STEVEN, MD .   Specialty:  Ophthalmology Why:  09-26-15 at 9:45 Contact information: [place] [address] [phone]        Edmonia James, NP .   Contact information: [place] [phone]         See handout

## 2015-09-25 NOTE — Op Note (Signed)
Date of Surgery: 09/25/2015 Date of Dictation: 09/25/2015 8:09 AM Pre-operative Diagnosis:  Nuclear Sclerotic Cataract left Eye Post-operative Diagnosis: same Procedure performed: Extra-capsular Cataract Extraction (ECCE) with placement of a posterior chamber intraocular lens (IOL) left Eye IOL:  Implant Name Type Inv. Item Serial No. Manufacturer Lot No. LRB No. Used  LENS IOL ACRYSOF IQ 22.0 - [id number] Intraocular Lens LENS IOL ACRYSOF IQ 22.0 [id number] ALCON [id number] Left 1   Anesthesia: 2% Lidocaine and 4% Marcaine in a 50/50 mixture with 10 unites/ml of Hylenex given as a peribulbar Anesthesiologist: Anesthesiologist: Emmie Niemann, MD CRNA: Kennon Holter, CRNA Complications: none Estimated Blood Loss: less than 1 ml  Description of procedure:  The patient was given anesthesia and sedation via intravenous access. The patient was then prepped and draped in the usual fashion. A 25-gauge needle was bent for initiating the capsulorhexis. A 5-0 silk suture was placed through the conjunctiva superior and inferiorly to serve as bridle sutures. Hemostasis was obtained at the superior limbus using an eraser cautery. A partial thickness groove was made at the anterior surgical limbus with a 64 Beaver blade and this was dissected anteriorly with an Avaya. The anterior chamber was entered at 10 o'clock with a 1.0 mm paracentesis knife and through the lamellar dissection with a 2.6 mm Alcon keratome. Epi-Shugarcaine 0.5 CC [9 cc BSS Plus (Alcon), 3 cc 4% preservative-free lidocaine (Hospira) and 4 cc 1:1000 preservative-free, bisulfite-free epinephrine] was injected into the anterior chamber via the paracentesis tract. Epi-Shugarcaine 0.5 CC [9 cc BSS Plus (Alcon), 3 cc 4% preservative-free lidocaine (Hospira) and 4 cc 1:1000 preservative-free, bisulfite-free epinephrine] was injected into the anterior chamber via the paracentesis tract. DiscoVisc was injected to replace the aqueous  and a continuous tear curvilinear capsulorhexis was performed using a bent 25-gauge needle.  Balance salt on a syringe was used to perform hydro-dissection and phacoemulsification was carried out using a divide and conquer technique. Procedure(s) with comments: CATARACT EXTRACTION PHACO AND INTRAOCULAR LENS PLACEMENT (IOC) (Left) - [place] 01:23 AP% 24.0 CDE 34.98 Fluid pack lot # [id number]. Irrigation/aspiration was used to remove the residual cortex and the capsular bag was inflated with DiscoVisc. The intraocular lens was inserted into the capsular bag using a pre-loaded UltraSert Delivery System. Irrigation/aspiration was used to remove the residual DiscoVisc. The wound was inflated with balanced salt and checked for leaks. None were found. Miostat was injected via the paracentesis track and 0.1 ml of cefuroxime containing 1 mg of drug  was injected via the paracentesis track. The wound was checked for leaks again and none were found.   The bridal sutures were removed and two drops of Vigamox were placed on the eye. An eye shield was placed to protect the eye and the patient was discharged to the recovery area in good condition.   Lucetta Baehr MD

## 2015-09-25 NOTE — Anesthesia Preprocedure Evaluation (Addendum)
Anesthesia Evaluation  Patient identified by MRN, date of birth, ID band Patient awake    Reviewed: Allergy & Precautions, NPO status , Patient's Chart, lab work & pertinent test results  History of Anesthesia Complications Negative for: history of anesthetic complications  Airway Mallampati: II  TM Distance: >3 FB Neck ROM: Full    Dental no notable dental hx.    Pulmonary neg sleep apnea, neg COPD,    breath sounds clear to auscultation- rhonchi (-) wheezing      Cardiovascular hypertension, Pt. on medications (-) CAD and (-) Past MI  Rhythm:Regular Rate:Normal - Systolic murmurs and - Diastolic murmurs    Neuro/Psych Seizures: had seizures during hospitalization for pneumonia, has not had any since and is not on antiepileptic medication.  negative psych ROS   GI/Hepatic negative GI ROS, Neg liver ROS,   Endo/Other  neg diabetesHypothyroidism   Renal/GU CRFRenal disease     Musculoskeletal   Abdominal Normal abdominal exam  (+)   Peds  Hematology negative hematology ROS (+)   Anesthesia Other Findings Past Medical History: No date: Cancer ([place])     Comment: skin No date: Chronic kidney disease     Comment: history of renal insufficiency No date: Hypercholesteremia No date: Hypertension No date: Hypothyroidism No date: Lymphedema of leg No date: Pneumonia     Comment: in the past 2014: Seizures ([place])     Comment: no seizures since then No date: Spinal stenosis   Reproductive/Obstetrics negative OB ROS                            Anesthesia Physical Anesthesia Plan  ASA: III  Anesthesia Plan: MAC   Post-op Pain Management:    Induction:   Airway Management Planned: Natural Airway  Additional Equipment:   Intra-op Plan: Utilization Of Total Body Hypothermia per surgeon request  Post-operative Plan:   Informed Consent: I have reviewed the patients History and Physical,  chart, labs and discussed the procedure including the risks, benefits and alternatives for the proposed anesthesia with the patient or authorized representative who has indicated his/her understanding and acceptance.     Plan Discussed with: Anesthesiologist and CRNA  Anesthesia Plan Comments:        Anesthesia Quick Evaluation

## 2015-09-25 NOTE — Transfer of Care (Signed)
Immediate Anesthesia Transfer of Care Note  Patient: Theresa Snyder  Procedure(s) Performed: Procedure(s) with comments: CATARACT EXTRACTION PHACO AND INTRAOCULAR LENS PLACEMENT (IOC) (Left) - [place] 01:23 AP% 24.0 CDE 34.98 Fluid pack lot # [id number]  Patient Location: PACU and Short Stay  Anesthesia Type:MAC and Spinal  Level of Consciousness: awake, alert  and oriented  Airway & Oxygen Therapy: Patient Spontanous Breathing and Patient connected to nasal cannula oxygen  Post-op Assessment: Report given to RN and Post -op Vital signs reviewed and stable  Post vital signs: Reviewed and stable  Last Vitals:  Vitals:   09/25/15 0810 09/25/15 0813  BP: 138/60   Pulse: 65   Resp: 16   Temp: 36.2 C 36.2 C    Last Pain:  Vitals:   09/25/15 0813  TempSrc: Oral         Complications: No apparent anesthesia complications

## 2015-09-25 NOTE — Interval H&P Note (Signed)
History and Physical Interval Note:  09/25/2015 7:24 AM  Theresa Snyder  has presented today for surgery, with the diagnosis of CATARACT  The various methods of treatment have been discussed with the patient and family. After consideration of risks, benefits and other options for treatment, the patient has consented to  Procedure(s): CATARACT EXTRACTION PHACO AND INTRAOCULAR LENS PLACEMENT ([place]) (Left) as a surgical intervention .  The patient's history has been reviewed, patient examined, no change in status, stable for surgery.  I have reviewed the patient's chart and labs.  Questions were answered to the patient's satisfaction.     [name]

## 2015-09-25 NOTE — Progress Notes (Signed)
IV discontinued.

## 2015-10-21 ENCOUNTER — Ambulatory Visit: Payer: Medicare Other | Admitting: Podiatry

## 2016-06-29 ENCOUNTER — Encounter: Payer: Self-pay | Admitting: *Deleted

## 2016-06-30 NOTE — H&P (Signed)
See scanned note.

## 2016-07-01 ENCOUNTER — Ambulatory Visit
Admission: RE | Admit: 2016-07-01 | Discharge: 2016-07-01 | Disposition: A | Discharge status: Home / Self Care | Payer: Medicare Other | Source: Ambulatory Visit | Attending: Ophthalmology | Admitting: Ophthalmology

## 2016-07-01 ENCOUNTER — Encounter
Admission: RE | Disposition: A | Discharge status: Home / Self Care | Payer: Self-pay | Source: Ambulatory Visit | Attending: Ophthalmology

## 2016-07-01 ENCOUNTER — Ambulatory Visit: Payer: Medicare Other | Admitting: Anesthesiology

## 2016-07-01 ENCOUNTER — Encounter: Payer: Self-pay | Admitting: *Deleted

## 2016-07-01 DIAGNOSIS — Z79899 Other long term (current) drug therapy: Secondary | ICD-10-CM | POA: Insufficient documentation

## 2016-07-01 DIAGNOSIS — H2511 Age-related nuclear cataract, right eye: Secondary | ICD-10-CM | POA: Diagnosis present

## 2016-07-01 DIAGNOSIS — H25011 Cortical age-related cataract, right eye: Secondary | ICD-10-CM | POA: Diagnosis not present

## 2016-07-01 DIAGNOSIS — I10 Essential (primary) hypertension: Secondary | ICD-10-CM | POA: Insufficient documentation

## 2016-07-01 DIAGNOSIS — E039 Hypothyroidism, unspecified: Secondary | ICD-10-CM | POA: Insufficient documentation

## 2016-07-01 HISTORY — DX: Personal history of other diseases of the digestive system: Z87.19

## 2016-07-01 HISTORY — DX: Unspecified osteoarthritis, unspecified site: M19.90

## 2016-07-01 HISTORY — PX: CATARACT EXTRACTION W/PHACO: SHX586

## 2016-07-01 SURGERY — PHACOEMULSIFICATION, CATARACT, WITH IOL INSERTION
Anesthesia: Monitor Anesthesia Care | Site: Eye | Laterality: Right | Wound class: Clean

## 2016-07-01 MED ORDER — SODIUM CHLORIDE 0.9 % IV SOLN
INTRAVENOUS | Status: DC
  Administered 2016-07-01: 10:00:00 via INTRAVENOUS

## 2016-07-01 MED ORDER — MOXIFLOXACIN HCL 0.5 % OP SOLN
1.0000 [drp] | OPHTHALMIC | Status: AC
  Administered 2016-07-01 (×3): 1 [drp] via OPHTHALMIC

## 2016-07-01 MED ORDER — ONDANSETRON HCL 4 MG/2ML IJ SOLN
INTRAMUSCULAR | Status: AC
  Filled 2016-07-01: qty 2

## 2016-07-01 MED ORDER — POVIDONE-IODINE 5 % OP SOLN
OPHTHALMIC | Status: DC | PRN
  Administered 2016-07-01: 1 via OPHTHALMIC

## 2016-07-01 MED ORDER — CEFUROXIME OPHTHALMIC INJECTION 1 MG/0.1 ML
INJECTION | OPHTHALMIC | Status: AC
  Filled 2016-07-01: qty 0.1

## 2016-07-01 MED ORDER — CARBACHOL 0.01 % IO SOLN
INTRAOCULAR | Status: DC | PRN
  Administered 2016-07-01: 0.5 mL via INTRAOCULAR

## 2016-07-01 MED ORDER — CEFUROXIME OPHTHALMIC INJECTION 1 MG/0.1 ML
INJECTION | OPHTHALMIC | Status: DC | PRN
  Administered 2016-07-01: 1 mg via INTRACAMERAL

## 2016-07-01 MED ORDER — CYCLOPENTOLATE HCL 2 % OP SOLN
OPHTHALMIC | Status: AC
  Administered 2016-07-01: 1 [drp] via OPHTHALMIC
  Filled 2016-07-01: qty 2

## 2016-07-01 MED ORDER — NA CHONDROIT SULF-NA HYALURON 40-17 MG/ML IO SOLN
INTRAOCULAR | Status: DC | PRN
  Administered 2016-07-01: 1 mL via INTRAOCULAR

## 2016-07-01 MED ORDER — NA CHONDROIT SULF-NA HYALURON 40-17 MG/ML IO SOLN
INTRAOCULAR | Status: AC
  Filled 2016-07-01: qty 1

## 2016-07-01 MED ORDER — EPINEPHRINE PF 1 MG/ML IJ SOLN
INTRAOCULAR | Status: DC | PRN
  Administered 2016-07-01: 10:00:00 via OPHTHALMIC

## 2016-07-01 MED ORDER — HYALURONIDASE HUMAN 150 UNIT/ML IJ SOLN
INTRAMUSCULAR | Status: AC
  Filled 2016-07-01: qty 1

## 2016-07-01 MED ORDER — BUPIVACAINE HCL (PF) 0.75 % IJ SOLN
INTRAMUSCULAR | Status: AC
  Filled 2016-07-01: qty 10

## 2016-07-01 MED ORDER — EPINEPHRINE PF 1 MG/ML IJ SOLN
INTRAMUSCULAR | Status: AC
  Filled 2016-07-01: qty 2

## 2016-07-01 MED ORDER — CYCLOPENTOLATE HCL 2 % OP SOLN
1.0000 [drp] | OPHTHALMIC | Status: AC
  Administered 2016-07-01 (×4): 1 [drp] via OPHTHALMIC

## 2016-07-01 MED ORDER — PHENYLEPHRINE HCL 10 % OP SOLN
OPHTHALMIC | Status: AC
  Administered 2016-07-01: 1 [drp] via OPHTHALMIC
  Filled 2016-07-01: qty 5

## 2016-07-01 MED ORDER — ALFENTANIL 500 MCG/ML IJ INJ
INJECTION | INTRAVENOUS | Status: DC | PRN
  Administered 2016-07-01: 500 ug via INTRAVENOUS
  Administered 2016-07-01: 100 ug via INTRAVENOUS

## 2016-07-01 MED ORDER — PHENYLEPHRINE HCL 10 % OP SOLN
1.0000 [drp] | OPHTHALMIC | Status: AC
  Administered 2016-07-01 (×4): 1 [drp] via OPHTHALMIC

## 2016-07-01 MED ORDER — TETRACAINE HCL 0.5 % OP SOLN
OPHTHALMIC | Status: AC
  Filled 2016-07-01: qty 2

## 2016-07-01 MED ORDER — MOXIFLOXACIN HCL 0.5 % OP SOLN
OPHTHALMIC | Status: AC
Start: 2016-07-01 — End: 2016-07-01
  Administered 2016-07-01: 1 [drp] via OPHTHALMIC
  Filled 2016-07-01: qty 3

## 2016-07-01 MED ORDER — TETRACAINE HCL 0.5 % OP SOLN
OPHTHALMIC | Status: DC | PRN
  Administered 2016-07-01: 2 [drp] via OPHTHALMIC

## 2016-07-01 MED ORDER — LIDOCAINE HCL (PF) 4 % IJ SOLN
INTRAMUSCULAR | Status: DC | PRN
  Administered 2016-07-01: 9 mL via OPHTHALMIC

## 2016-07-01 MED ORDER — LIDOCAINE HCL (PF) 4 % IJ SOLN
INTRAOCULAR | Status: DC | PRN
  Administered 2016-07-01: 4 mL via OPHTHALMIC

## 2016-07-01 MED ORDER — POVIDONE-IODINE 5 % OP SOLN
OPHTHALMIC | Status: AC
  Filled 2016-07-01: qty 30

## 2016-07-01 SURGICAL SUPPLY — 23 items
CORD BIP STRL DISP 12FT (MISCELLANEOUS) ×2 IMPLANT
DRAPE XRAY CASSETTE 23X24 (DRAPES) ×2 IMPLANT
ERASER HMR WETFIELD 18G (MISCELLANEOUS) ×2 IMPLANT
GLOVE BIO SURGEON STRL SZ8 (GLOVE) ×2 IMPLANT
GLOVE SURG LX 6.5 MICRO (GLOVE) ×1
GLOVE SURG LX 8.0 MICRO (GLOVE) ×1
GLOVE SURG LX STRL 6.5 MICRO (GLOVE) ×1 IMPLANT
GLOVE SURG LX STRL 8.0 MICRO (GLOVE) ×1 IMPLANT
GOWN STRL REUS W/ TWL LRG LVL3 (GOWN DISPOSABLE) ×1 IMPLANT
GOWN STRL REUS W/ TWL XL LVL3 (GOWN DISPOSABLE) ×1 IMPLANT
GOWN STRL REUS W/TWL LRG LVL3 (GOWN DISPOSABLE) ×1
GOWN STRL REUS W/TWL XL LVL3 (GOWN DISPOSABLE) ×1
LENS IOL ACRYSOF IQ 22.5 (Intraocular Lens) ×2 IMPLANT
PACK CATARACT (MISCELLANEOUS) ×2 IMPLANT
PACK CATARACT DINGLEDEIN LX (MISCELLANEOUS) ×2 IMPLANT
PACK EYE AFTER SURG (MISCELLANEOUS) ×2 IMPLANT
SHLD EYE VISITEC  UNIV (MISCELLANEOUS) ×2 IMPLANT
SOL BSS BAG (MISCELLANEOUS) ×2
SOLUTION BSS BAG (MISCELLANEOUS) ×1 IMPLANT
SUT SILK 5-0 (SUTURE) ×2 IMPLANT
SYR 5ML LL (SYRINGE) ×2 IMPLANT
WATER STERILE IRR 250ML POUR (IV SOLUTION) ×2 IMPLANT
WIPE NON LINTING 3.25X3.25 (MISCELLANEOUS) ×2 IMPLANT

## 2016-07-01 NOTE — Discharge Instructions (Signed)

## 2016-07-01 NOTE — Anesthesia Postprocedure Evaluation (Signed)
Anesthesia Post Note  Patient: Theresa Snyder  Procedure(s) Performed: Procedure(s) (LRB): CATARACT EXTRACTION PHACO AND INTRAOCULAR LENS PLACEMENT (IOC) (Right)  Patient location during evaluation: Short Stay Anesthesia Type: MAC Level of consciousness: awake Pain management: pain level controlled Vital Signs Assessment: post-procedure vital signs reviewed and stable Respiratory status: spontaneous breathing Cardiovascular status: blood pressure returned to baseline Postop Assessment: no headache Anesthetic complications: no     Last Vitals:  Vitals:   07/01/16 0758 07/01/16 1019  BP: (!) 134/57 (!) 123/51  Pulse: 62 (!) 59  Resp: 18 16  Temp: 36.7 C 36.2 C    Last Pain:  Vitals:   07/01/16 0758  TempSrc: Oral  PainSc: 5                  Buckner Malta

## 2016-07-01 NOTE — Transfer of Care (Signed)
Immediate Anesthesia Transfer of Care Note  Patient: Theresa Snyder  Procedure(s) Performed: Procedure(s) with comments: CATARACT EXTRACTION PHACO AND INTRAOCULAR LENS PLACEMENT (IOC) (Right) - [place] 1:25.9 AP% 24.8 CDE 40.61 Fluid Pack Lot # [id number]  Patient Location: PACU  Anesthesia Type:MAC  Level of Consciousness: awake  Airway & Oxygen Therapy: Patient Spontanous Breathing  Post-op Assessment: Report given to RN  Post vital signs: Reviewed and stable  Last Vitals:  Vitals:   07/01/16 0758 07/01/16 1019  BP: (!) 134/57 (!) 123/51  Pulse: 62 (!) 59  Resp: 18 16  Temp: 36.7 C 36.2 C    Last Pain:  Vitals:   07/01/16 0758  TempSrc: Oral  PainSc: 5          Complications: No apparent anesthesia complications

## 2016-07-01 NOTE — Anesthesia Procedure Notes (Signed)
Procedure Name: MAC Date/Time: 07/01/2016 9:43 AM Performed by: Allean Found Pre-anesthesia Checklist: Patient identified, Emergency Drugs available, Suction available, Patient being monitored and Timeout performed Patient Re-evaluated:Patient Re-evaluated prior to inductionOxygen Delivery Method: Nasal cannula Placement Confirmation: positive ETCO2

## 2016-07-01 NOTE — Anesthesia Post-op Follow-up Note (Cosign Needed)
Anesthesia QCDR form completed.        

## 2016-07-01 NOTE — Op Note (Signed)
Date of Surgery: 07/01/2016 Date of Dictation: 07/01/2016 10:19 AM Pre-operative Diagnosis:  Nuclear Sclerotic Cataract and Cortical Cataract right Eye Post-operative Diagnosis: same Procedure performed: Extra-capsular Cataract Extraction (ECCE) with placement of a posterior chamber intraocular lens (IOL) right Eye IOL:  Implant Name Type Inv. Item Serial No. Manufacturer Lot No. LRB No. Used  LENS IOL ACRYSOF IQ 22.5 - [id number] Intraocular Lens LENS IOL ACRYSOF IQ 22.5 [id number] ALCON   Right 1   Anesthesia: 2% Lidocaine and 4% Marcaine in a 50/50 mixture with 10 unites/ml of Hylenex given as a peribulbar Anesthesiologist: Anesthesiologist: Gunnar Fusi, MD CRNA: Allean Found, CRNA Complications: none Estimated Blood Loss: less than 1 ml  Description of procedure:  The patient was given anesthesia and sedation via intravenous access. The patient was then prepped and draped in the usual fashion. A 25-gauge needle was bent for initiating the capsulorhexis. A 5-0 silk suture was placed through the conjunctiva superior and inferiorly to serve as bridle sutures. Hemostasis was obtained at the superior limbus using an eraser cautery. A partial thickness groove was made at the anterior surgical limbus with a 64 Beaver blade and this was dissected anteriorly with an Avaya. The anterior chamber was entered at 10 o'clock with a 1.0 mm paracentesis knife and through the lamellar dissection with a 2.6 mm Alcon keratome. Epi-Shugarcaine 0.5 CC [9 cc BSS Plus (Alcon), 3 cc 4% preservative-free lidocaine (Hospira) and 4 cc 1:1000 preservative-free, bisulfite-free epinephrine] was injected into the anterior chamber via the paracentesis tract. Epi-Shugarcaine 0.5 CC [9 cc BSS Plus (Alcon), 3 cc 4% preservative-free lidocaine (Hospira) and 4 cc 1:1000 preservative-free, bisulfite-free epinephrine] was injected into the anterior chamber via the paracentesis tract. DiscoVisc was injected  to replace the aqueous and a continuous tear curvilinear capsulorhexis was performed using a bent 25-gauge needle.  Balance salt on a syringe was used to perform hydro-dissection and phacoemulsification was carried out using a divide and conquer technique. Procedure(s) with comments: CATARACT EXTRACTION PHACO AND INTRAOCULAR LENS PLACEMENT (IOC) (Right) - [place] 1:25.9 AP% 24.8 CDE 40.61 Fluid Pack Lot # [id number]. Irrigation/aspiration was used to remove the residual cortex and the capsular bag was inflated with DiscoVisc. The intraocular lens was inserted into the capsular bag using a pre-loaded UltraSert Delivery System. Irrigation/aspiration was used to remove the residual DiscoVisc. The wound was inflated with balanced salt and checked for leaks. None were found. Miostat was injected via the paracentesis track and 0.1 ml of cefuroxime containing 1 mg of drug  was injected via the paracentesis track. The wound was checked for leaks again and none were found.   The bridal sutures were removed and two drops of Vigamox were placed on the eye. An eye shield was placed to protect the eye and the patient was discharged to the recovery area in good condition.   Maudie Shingledecker MD

## 2016-07-01 NOTE — Anesthesia Preprocedure Evaluation (Signed)
Anesthesia Evaluation  Patient identified by MRN, date of birth, ID band Patient awake    Reviewed: Allergy & Precautions, NPO status , Patient's Chart, lab work & pertinent test results  History of Anesthesia Complications Negative for: history of anesthetic complications  Airway Mallampati: III       Dental  (+) Partial Upper, Partial Lower, Loose   Pulmonary           Cardiovascular hypertension, Pt. on medications      Neuro/Psych Seizures - (4 years ago, off meds x 3 years),     GI/Hepatic Neg liver ROS, hiatal hernia,   Endo/Other  Hypothyroidism   Renal/GU Renal InsufficiencyRenal disease     Musculoskeletal   Abdominal   Peds  Hematology   Anesthesia Other Findings   Reproductive/Obstetrics                             Anesthesia Physical Anesthesia Plan  ASA: III  Anesthesia Plan: MAC   Post-op Pain Management:    Induction: Intravenous  Airway Management Planned: Nasal Cannula  Additional Equipment:   Intra-op Plan:   Post-operative Plan:   Informed Consent: I have reviewed the patients History and Physical, chart, labs and discussed the procedure including the risks, benefits and alternatives for the proposed anesthesia with the patient or authorized representative who has indicated his/her understanding and acceptance.     Plan Discussed with:   Anesthesia Plan Comments:         Anesthesia Quick Evaluation

## 2016-07-01 NOTE — Interval H&P Note (Signed)
History and Physical Interval Note:  07/01/2016 9:34 AM  Theresa Snyder  has presented today for surgery, with the diagnosis of NUCLEAR SCLEROTIC CATARACT RIGHT EYE  The various methods of treatment have been discussed with the patient and family. After consideration of risks, benefits and other options for treatment, the patient has consented to  Procedure(s) with comments: CATARACT EXTRACTION PHACO AND INTRAOCULAR LENS PLACEMENT (I[facility]) (Right) - [place] AP% CDE Fluid Pack Lot # [id number] as a surgical intervention .  The patient's history has been reviewed, patient examined, no change in status, stable for surgery.  I have reviewed the patient's chart and labs.  Questions were answered to the patient's satisfaction.     Megean Fabio

## 2016-07-23 ENCOUNTER — Ambulatory Visit
Admission: RE | Admit: 2016-07-23 | Discharge: 2016-07-23 | Disposition: A | Discharge status: Home / Self Care | Payer: Medicare Other | Source: Ambulatory Visit | Attending: Family Medicine | Admitting: Family Medicine

## 2016-07-23 ENCOUNTER — Other Ambulatory Visit: Payer: Self-pay | Admitting: Family Medicine

## 2016-07-23 DIAGNOSIS — M5417 Radiculopathy, lumbosacral region: Secondary | ICD-10-CM

## 2016-07-23 DIAGNOSIS — I700 Atherosclerosis of aorta: Secondary | ICD-10-CM | POA: Insufficient documentation

## 2016-07-23 DIAGNOSIS — M5136 Other intervertebral disc degeneration, lumbar region: Secondary | ICD-10-CM | POA: Diagnosis not present

## 2016-12-07 ENCOUNTER — Ambulatory Visit (INDEPENDENT_AMBULATORY_CARE_PROVIDER_SITE_OTHER): Payer: Medicare Other | Admitting: Podiatry

## 2016-12-07 ENCOUNTER — Encounter: Payer: Self-pay | Admitting: Podiatry

## 2016-12-07 DIAGNOSIS — M76829 Posterior tibial tendinitis, unspecified leg: Secondary | ICD-10-CM

## 2016-12-07 NOTE — Progress Notes (Signed)
She presents today states that I'm still having some tenderness right in here. She points to the posterior tibial tendon. She states he was doing very well for a long time and now has started become painful again.  Objective: Vital signs are stable she is alert and oriented 3 last time she was seen was July 2017. She still has tenderness on palpation of the posterior tibial tendon just beneath the medial malleolus.  Assessment: Posterior tibial tendinitis left.  Plan: Injected the area today with dexamethasone and local anesthetic. Follow-up with her as needed.

## 2017-10-09 ENCOUNTER — Encounter: Payer: Self-pay | Admitting: Emergency Medicine

## 2017-10-09 ENCOUNTER — Other Ambulatory Visit: Payer: Self-pay

## 2017-10-09 ENCOUNTER — Emergency Department: Payer: Medicare Other

## 2017-10-09 ENCOUNTER — Emergency Department
Admission: EM | Admit: 2017-10-09 | Discharge: 2017-10-10 | Disposition: A | Discharge status: Home / Self Care | Payer: Medicare Other | Attending: Emergency Medicine | Admitting: Emergency Medicine

## 2017-10-09 DIAGNOSIS — Z85828 Personal history of other malignant neoplasm of skin: Secondary | ICD-10-CM | POA: Diagnosis not present

## 2017-10-09 DIAGNOSIS — Z7982 Long term (current) use of aspirin: Secondary | ICD-10-CM | POA: Insufficient documentation

## 2017-10-09 DIAGNOSIS — Z79899 Other long term (current) drug therapy: Secondary | ICD-10-CM | POA: Insufficient documentation

## 2017-10-09 DIAGNOSIS — R531 Weakness: Secondary | ICD-10-CM | POA: Diagnosis present

## 2017-10-09 DIAGNOSIS — E039 Hypothyroidism, unspecified: Secondary | ICD-10-CM | POA: Diagnosis not present

## 2017-10-09 DIAGNOSIS — N189 Chronic kidney disease, unspecified: Secondary | ICD-10-CM | POA: Diagnosis not present

## 2017-10-09 DIAGNOSIS — N390 Urinary tract infection, site not specified: Secondary | ICD-10-CM | POA: Diagnosis not present

## 2017-10-09 DIAGNOSIS — I129 Hypertensive chronic kidney disease with stage 1 through stage 4 chronic kidney disease, or unspecified chronic kidney disease: Secondary | ICD-10-CM | POA: Insufficient documentation

## 2017-10-09 LAB — CBC WITH DIFFERENTIAL/PLATELET
Basophils Absolute: 0 10*3/uL (ref 0–0.1)
Basophils Relative: 0 %
Eosinophils Absolute: 0 10*3/uL (ref 0–0.7)
Eosinophils Relative: 1 %
HEMATOCRIT: 37.3 % (ref 35.0–47.0)
HEMOGLOBIN: 12.9 g/dL (ref 12.0–16.0)
LYMPHS ABS: 2.1 10*3/uL (ref 1.0–3.6)
Lymphocytes Relative: 38 %
MCH: 32.2 pg (ref 26.0–34.0)
MCHC: 34.7 g/dL (ref 32.0–36.0)
MCV: 92.7 fL (ref 80.0–100.0)
MONO ABS: 1 10*3/uL — AB (ref 0.2–0.9)
MONOS PCT: 18 %
NEUTROS PCT: 43 %
Neutro Abs: 2.5 10*3/uL (ref 1.4–6.5)
Platelets: 157 10*3/uL (ref 150–440)
RBC: 4.02 MIL/uL (ref 3.80–5.20)
RDW: 13 % (ref 11.5–14.5)
WBC: 5.7 10*3/uL (ref 3.6–11.0)

## 2017-10-09 LAB — URINALYSIS, COMPLETE (UACMP) WITH MICROSCOPIC
Bilirubin Urine: NEGATIVE
GLUCOSE, UA: NEGATIVE mg/dL
Hgb urine dipstick: NEGATIVE
Ketones, ur: NEGATIVE mg/dL
Nitrite: NEGATIVE
PH: 5 (ref 5.0–8.0)
Protein, ur: NEGATIVE mg/dL
SPECIFIC GRAVITY, URINE: 1.006 (ref 1.005–1.030)

## 2017-10-09 LAB — TROPONIN I: Troponin I: <0.03 ng/mL (ref <0.03)

## 2017-10-09 LAB — COMPREHENSIVE METABOLIC PANEL
ALK PHOS: 43 U/L (ref 38–126)
ALT: 21 U/L (ref 0–44)
ANION GAP: 10 (ref 5–15)
AST: 36 U/L (ref 15–41)
Albumin: 4.3 g/dL (ref 3.5–5.0)
BILIRUBIN TOTAL: 0.8 mg/dL (ref 0.3–1.2)
BUN: 22 mg/dL (ref 8–23)
CALCIUM: 8.6 mg/dL — AB (ref 8.9–10.3)
CO2: 22 mmol/L (ref 22–32)
Chloride: 99 mmol/L (ref 98–111)
Creatinine, Ser: 1.68 mg/dL — ABNORMAL HIGH (ref 0.44–1.00)
GFR calc non Af Amer: 27 mL/min — ABNORMAL LOW (ref >60)
GFR, EST AFRICAN AMERICAN: 31 mL/min — AB (ref >60)
Glucose, Bld: 132 mg/dL — ABNORMAL HIGH (ref 70–99)
Potassium: 3.8 mmol/L (ref 3.5–5.1)
Sodium: 131 mmol/L — ABNORMAL LOW (ref 135–145)
TOTAL PROTEIN: 7 g/dL (ref 6.5–8.1)

## 2017-10-09 LAB — TSH: TSH: 1.576 u[IU]/mL (ref 0.350–4.500)

## 2017-10-09 MED ORDER — CEPHALEXIN 500 MG PO CAPS: 500.0000 mg | ORAL_CAPSULE | Freq: Three times a day (TID) | ORAL | 0 refills | Status: DC

## 2017-10-09 MED ORDER — CEPHALEXIN 500 MG PO CAPS: 500.0000 mg | ORAL_CAPSULE | Freq: Three times a day (TID) | ORAL | 0 refills | Status: AC

## 2017-10-09 MED ORDER — CEPHALEXIN 500 MG PO CAPS
500.0000 mg | ORAL_CAPSULE | Freq: Once | ORAL | Status: AC
  Administered 2017-10-10: 500 mg via ORAL
  Filled 2017-10-09: qty 1

## 2017-10-09 MED ORDER — SODIUM CHLORIDE 0.9 % IV BOLUS
1000.0000 mL | Freq: Once | INTRAVENOUS | Status: AC
  Administered 2017-10-09: 1000 mL via INTRAVENOUS

## 2017-10-09 NOTE — ED Triage Notes (Signed)
EMS pt to rm 26 from home with report of not feeling well for about 9 days. Weakness. Today worse and having nausea.

## 2017-10-09 NOTE — ED Provider Notes (Signed)
[facility] Emergency Department Provider Note ____________________________________________   None    (approximate)  I have reviewed the triage vital signs and the nursing notes.   HISTORY  Chief Complaint No chief complaint on file.  HPI Theresa Snyder is a 84 y.o. female with a history of chronic kidney disease, hypertension, hypercholesterolemia and lymphedema who was presented to the emergency department with 9 days of generalized weakness.  She says that originally she had runny nose, as well as sore throat but this has resolved.  However, despite the symptoms resolving from what appeared to be a mild URI, she has stayed weak.  Denies any focal weakness.  Says that she has been nauseous but without any vomiting.  Says that she also feels like she has to urinate often but tries to urinate and then no urine comes out.  Says that she has baseline lymphedema with the left leg being slightly more swollen than the right.   Past Medical History:  Diagnosis Date  . Arthritis   . Cancer ([place])    skin  . Chronic kidney disease    history of renal insufficiency  . History of hiatal hernia   . Hypercholesteremia   . Hypertension   . Hypothyroidism   . Lymphedema of leg   . Pneumonia    in the past  . Seizures ([place]) 2014   no seizures since then  . Spinal stenosis     Patient Active Problem List   Diagnosis Date Noted  . Chronic kidney disease 03/04/2015  . Adaptive colitis 03/04/2015  . BP (high blood pressure) 05/18/2013  . Adult hypothyroidism 05/18/2013  . HLD (hyperlipidemia) 05/18/2013    Past Surgical History:  Procedure Laterality Date  . APPENDECTOMY    . CATARACT EXTRACTION W/PHACO Left 09/25/2015   Procedure: CATARACT EXTRACTION PHACO AND INTRAOCULAR LENS PLACEMENT ([facility]);  Surgeon: Estill Cotta, MD;  Location: [facility] ORS;  Service: Ophthalmology;  Laterality: Left;  [place] 01:23 AP% 24.0 CDE 34.98 Fluid pack lot # [id number]  . CATARACT  EXTRACTION W/PHACO Right 07/01/2016   Procedure: CATARACT EXTRACTION PHACO AND INTRAOCULAR LENS PLACEMENT (I[facility]);  Surgeon: Estill Cotta, MD;  Location: [facility] ORS;  Service: Ophthalmology;  Laterality: Right;  [place] 1:25.9 AP% 24.8 CDE 40.61 Fluid Pack Lot # [id number]  . TUBAL LIGATION      Prior to Admission medications   Medication Sig Start Date End Date Taking? Authorizing Provider  amLODipine (NORVASC) 10 MG tablet Take 10 mg by mouth daily with breakfast. for high blood pressure 01/12/15   [provider]  aspirin EC 81 MG tablet Take 81 mg by mouth daily with breakfast.     [provider]  benazepril (LOTENSIN) 20 MG tablet Take 20mg s by mouth daily at breakfast 01/12/15   [provider]  cholecalciferol (VITAMIN D) 1000 units tablet Take 1,000 Units by mouth daily with lunch.    [provider]  gabapentin (NEURONTIN) 300 MG capsule Take 300mg s by mouth at bedtime, may take an additional 100mg s twice daily as needed 01/12/15   [provider]  hydrochlorothiazide (HYDRODIURIL) 25 MG tablet Take 25mg s by mouth daily at breakfast 08/15/12   [provider]  levothyroxine (SYNTHROID, LEVOTHROID) 88 MCG tablet Take 2mcg by mouth daily before breakfast 01/12/15   [provider]  meclizine (ANTIVERT) 25 MG tablet Take 12.5mg s to 25mg s by mouth once daily as needed for dizziness 02/14/15   [provider]  pravastatin (PRAVACHOL) 20 MG tablet Take  20 mg by mouth at bedtime.     [provider]    Allergies Iodinated diagnostic agents; Lorazepam; Risperidone; and Sulfamethoxazole-trimethoprim  History reviewed. No pertinent family history.  Social History Social History   Tobacco Use  . Smoking status: Never Smoker  . Smokeless tobacco: Never Used  Substance Use Topics  . Alcohol use: No    Alcohol/week: 0.0 standard drinks  . Drug use: No    Review of Systems  Constitutional: No fever/chills Eyes: No  visual changes. ENT: No sore throat. Cardiovascular: Denies chest pain. Respiratory: Denies shortness of breath. Gastrointestinal: No abdominal pain. no vomiting.  No diarrhea.  No constipation. Genitourinary: As above Musculoskeletal: Negative for back pain. Skin: Negative for rash. Neurological: Negative for headaches, focal weakness or numbness.   ____________________________________________   PHYSICAL EXAM:  VITAL SIGNS: ED Triage Vitals [10/09/17 2107]  Enc Vitals Group     BP      Pulse Rate 65     Resp 18     Temp 98.3 F (36.8 C)     Temp Source Oral     SpO2 99 %     Weight 160 lb (72.6 kg)     Height 5' (1.524 m)     Head Circumference      Peak Flow      Pain Score 0     Pain Loc      Pain Edu?      Excl. in [place]?     Constitutional: Alert and oriented. Well appearing and in no acute distress. Eyes: Conjunctivae are normal.  Head: Atraumatic. Nose: No congestion/rhinnorhea. Mouth/Throat: Mucous membranes are moist.  Neck: No stridor.   Cardiovascular: Normal rate, regular rhythm. Grossly normal heart sounds.   Respiratory: Normal respiratory effort.  No retractions. Lungs CTAB. Gastrointestinal: Soft and nontender. No distention.  Musculoskeletal: Bilateral lower extremity edema which is mild to moderate with the left being slightly greater than the right. Neurologic:  Normal speech and language. No gross focal neurologic deficits are appreciated. Skin:  Skin is warm, dry and intact. No rash noted. Psychiatric: Mood and affect are normal. Speech and behavior are normal.  ____________________________________________   LABS (all labs ordered are listed, but only abnormal results are displayed)  Labs Reviewed  CBC WITH DIFFERENTIAL/PLATELET - Abnormal; Notable for the following components:      Result Value   Monocytes Absolute 1.0 (*)    All other components within normal limits  COMPREHENSIVE METABOLIC PANEL - Abnormal; Notable for the following  components:   Sodium 131 (*)    Glucose, Bld 132 (*)    Creatinine, Ser 1.68 (*)    Calcium 8.6 (*)    GFR calc non Af Amer 27 (*)    GFR calc Af Amer 31 (*)    All other components within normal limits  URINALYSIS, COMPLETE (UACMP) WITH MICROSCOPIC - Abnormal; Notable for the following components:   Color, Urine YELLOW (*)    APPearance HAZY (*)    Leukocytes, UA TRACE (*)    Bacteria, UA RARE (*)    All other components within normal limits  URINE CULTURE  TROPONIN I  TSH   ____________________________________________  EKG  ED ECG REPORT I, Doran Stabler, the attending physician, personally viewed and interpreted this ECG.   Date: 10/09/2017  EKG Time: 2108  Rate: 72  Rhythm: normal sinus rhythm  Axis: Normal  Intervals:none  ST&T Change: No ST segment elevation or depression.  No abnormal T wave  inversion.  ____________________________________________  RADIOLOGY  X-ray without acute processes.  CT head without acute process. ____________________________________________   PROCEDURES  Procedure(s) performed:   Procedures  Critical Care performed:   ____________________________________________   INITIAL IMPRESSION / ASSESSMENT AND PLAN / ED COURSE  Pertinent labs & imaging results that were available during my care of the patient were reviewed by me and considered in my medical decision making (see chart for details).  DDX: Left light abnormality, UTI, ACS, kidney failure, dehydration, pneumonia As part of my medical decision making, I reviewed the following data within the [place] Notes from prior ED visits  ----------------------------------------- 11:17 PM on 10/09/2017 -----------------------------------------  Patient at this time without distress.  Updated the patient as well as her daughter regarding the test results.  Due to the patient's positive urinalysis as well as urinary symptoms she will be treated for UTI.  She  will follow-up with her primary care doctor.  After 9 days the remainder of the labs appear reassuring her at baseline.  No signs of stroke after 90s as well on the CAT scan. ____________________________________________   FINAL CLINICAL IMPRESSION(S) / ED DIAGNOSES  UTI.  Generalized weakness.    NEW MEDICATIONS STARTED DURING THIS VISIT:  New Prescriptions   No medications on file     Note:  This document was prepared using Dragon voice recognition software and may include unintentional dictation errors.     Orbie Pyo, MD 10/09/17 [phone]

## 2017-10-10 DIAGNOSIS — N390 Urinary tract infection, site not specified: Secondary | ICD-10-CM | POA: Diagnosis not present

## 2017-10-11 LAB — URINE CULTURE

## 2018-06-21 ENCOUNTER — Other Ambulatory Visit: Payer: Self-pay | Admitting: Physical Medicine and Rehabilitation

## 2018-06-21 ENCOUNTER — Other Ambulatory Visit (HOSPITAL_COMMUNITY): Payer: Self-pay | Admitting: Physical Medicine and Rehabilitation

## 2018-06-21 DIAGNOSIS — M48062 Spinal stenosis, lumbar region with neurogenic claudication: Secondary | ICD-10-CM

## 2018-06-22 ENCOUNTER — Other Ambulatory Visit: Payer: Self-pay | Admitting: Physical Medicine and Rehabilitation

## 2018-06-22 DIAGNOSIS — M48062 Spinal stenosis, lumbar region with neurogenic claudication: Secondary | ICD-10-CM

## 2018-06-28 ENCOUNTER — Ambulatory Visit (HOSPITAL_COMMUNITY): Payer: Medicare Other

## 2018-06-30 ENCOUNTER — Ambulatory Visit
Admission: RE | Admit: 2018-06-30 | Discharge: 2018-06-30 | Disposition: A | Discharge status: Home / Self Care | Payer: Medicare Other | Source: Ambulatory Visit | Attending: Physical Medicine and Rehabilitation | Admitting: Physical Medicine and Rehabilitation

## 2018-06-30 ENCOUNTER — Other Ambulatory Visit: Payer: Self-pay

## 2018-06-30 DIAGNOSIS — M48062 Spinal stenosis, lumbar region with neurogenic claudication: Secondary | ICD-10-CM | POA: Diagnosis present

## 2020-03-23 ENCOUNTER — Inpatient Hospital Stay
Admission: EM | Admit: 2020-03-23 | Discharge: 2020-03-30 | DRG: 552 | Disposition: A | Discharge status: Skilled Nursing Facility | Payer: Medicare Other | Attending: Internal Medicine | Admitting: Internal Medicine

## 2020-03-23 ENCOUNTER — Emergency Department: Payer: Medicare Other

## 2020-03-23 ENCOUNTER — Other Ambulatory Visit: Payer: Self-pay

## 2020-03-23 DIAGNOSIS — E039 Hypothyroidism, unspecified: Secondary | ICD-10-CM | POA: Diagnosis not present

## 2020-03-23 DIAGNOSIS — N1832 Chronic kidney disease, stage 3b: Secondary | ICD-10-CM | POA: Diagnosis present

## 2020-03-23 DIAGNOSIS — S32040A Wedge compression fracture of fourth lumbar vertebra, initial encounter for closed fracture: Principal | ICD-10-CM | POA: Diagnosis present

## 2020-03-23 DIAGNOSIS — Z882 Allergy status to sulfonamides status: Secondary | ICD-10-CM

## 2020-03-23 DIAGNOSIS — Z888 Allergy status to other drugs, medicaments and biological substances status: Secondary | ICD-10-CM

## 2020-03-23 DIAGNOSIS — I129 Hypertensive chronic kidney disease with stage 1 through stage 4 chronic kidney disease, or unspecified chronic kidney disease: Secondary | ICD-10-CM | POA: Diagnosis present

## 2020-03-23 DIAGNOSIS — S2243XA Multiple fractures of ribs, bilateral, initial encounter for closed fracture: Principal | ICD-10-CM | POA: Diagnosis present

## 2020-03-23 DIAGNOSIS — Z79899 Other long term (current) drug therapy: Secondary | ICD-10-CM

## 2020-03-23 DIAGNOSIS — S32048A Other fracture of fourth lumbar vertebra, initial encounter for closed fracture: Secondary | ICD-10-CM | POA: Diagnosis not present

## 2020-03-23 DIAGNOSIS — Z85828 Personal history of other malignant neoplasm of skin: Secondary | ICD-10-CM

## 2020-03-23 DIAGNOSIS — K5900 Constipation, unspecified: Secondary | ICD-10-CM | POA: Diagnosis present

## 2020-03-23 DIAGNOSIS — N189 Chronic kidney disease, unspecified: Secondary | ICD-10-CM | POA: Diagnosis present

## 2020-03-23 DIAGNOSIS — T1490XA Injury, unspecified, initial encounter: Secondary | ICD-10-CM

## 2020-03-23 DIAGNOSIS — S32049A Unspecified fracture of fourth lumbar vertebra, initial encounter for closed fracture: Secondary | ICD-10-CM | POA: Diagnosis present

## 2020-03-23 DIAGNOSIS — E7800 Pure hypercholesterolemia, unspecified: Secondary | ICD-10-CM | POA: Diagnosis present

## 2020-03-23 DIAGNOSIS — Z7982 Long term (current) use of aspirin: Secondary | ICD-10-CM

## 2020-03-23 DIAGNOSIS — Y92093 Driveway of other non-institutional residence as the place of occurrence of the external cause: Secondary | ICD-10-CM

## 2020-03-23 DIAGNOSIS — E785 Hyperlipidemia, unspecified: Secondary | ICD-10-CM | POA: Diagnosis present

## 2020-03-23 DIAGNOSIS — Z20822 Contact with and (suspected) exposure to covid-19: Secondary | ICD-10-CM | POA: Diagnosis present

## 2020-03-23 DIAGNOSIS — R109 Unspecified abdominal pain: Secondary | ICD-10-CM

## 2020-03-23 DIAGNOSIS — W19XXXA Unspecified fall, initial encounter: Secondary | ICD-10-CM | POA: Diagnosis not present

## 2020-03-23 DIAGNOSIS — Z9841 Cataract extraction status, right eye: Secondary | ICD-10-CM

## 2020-03-23 DIAGNOSIS — I10 Essential (primary) hypertension: Secondary | ICD-10-CM | POA: Diagnosis present

## 2020-03-23 DIAGNOSIS — M48061 Spinal stenosis, lumbar region without neurogenic claudication: Secondary | ICD-10-CM | POA: Diagnosis present

## 2020-03-23 DIAGNOSIS — Z79890 Hormone replacement therapy (postmenopausal): Secondary | ICD-10-CM

## 2020-03-23 DIAGNOSIS — Z9842 Cataract extraction status, left eye: Secondary | ICD-10-CM

## 2020-03-23 DIAGNOSIS — G629 Polyneuropathy, unspecified: Secondary | ICD-10-CM | POA: Diagnosis present

## 2020-03-23 DIAGNOSIS — Z91041 Radiographic dye allergy status: Secondary | ICD-10-CM

## 2020-03-23 DIAGNOSIS — W1830XA Fall on same level, unspecified, initial encounter: Secondary | ICD-10-CM | POA: Diagnosis present

## 2020-03-23 DIAGNOSIS — S32000A Wedge compression fracture of unspecified lumbar vertebra, initial encounter for closed fracture: Secondary | ICD-10-CM

## 2020-03-23 DIAGNOSIS — Z961 Presence of intraocular lens: Secondary | ICD-10-CM | POA: Diagnosis present

## 2020-03-23 LAB — CBC WITH DIFFERENTIAL/PLATELET
Abs Immature Granulocytes: 0.04 10*3/uL (ref 0.00–0.07)
Basophils Absolute: 0 10*3/uL (ref 0.0–0.1)
Basophils Relative: 0 %
Eosinophils Absolute: 0.1 10*3/uL (ref 0.0–0.5)
Eosinophils Relative: 1 %
HCT: 37.9 % (ref 36.0–46.0)
Hemoglobin: 13.1 g/dL (ref 12.0–15.0)
Immature Granulocytes: 0 %
Lymphocytes Relative: 17 %
Lymphs Abs: 1.7 10*3/uL (ref 0.7–4.0)
MCH: 32.7 pg (ref 26.0–34.0)
MCHC: 34.6 g/dL (ref 30.0–36.0)
MCV: 94.5 fL (ref 80.0–100.0)
Monocytes Absolute: 0.8 10*3/uL (ref 0.1–1.0)
Monocytes Relative: 8 %
Neutro Abs: 7.5 10*3/uL (ref 1.7–7.7)
Neutrophils Relative %: 74 %
Platelets: 286 10*3/uL (ref 150–400)
RBC: 4.01 MIL/uL (ref 3.87–5.11)
RDW: 11.9 % (ref 11.5–15.5)
WBC: 10.2 10*3/uL (ref 4.0–10.5)
nRBC: 0 % (ref 0.0–0.2)

## 2020-03-23 LAB — BASIC METABOLIC PANEL
Anion gap: 13 (ref 5–15)
BUN: 27 mg/dL — ABNORMAL HIGH (ref 8–23)
CO2: 23 mmol/L (ref 22–32)
Calcium: 9.3 mg/dL (ref 8.9–10.3)
Chloride: 102 mmol/L (ref 98–111)
Creatinine, Ser: 1.53 mg/dL — ABNORMAL HIGH (ref 0.44–1.00)
GFR, Estimated: 33 mL/min — ABNORMAL LOW (ref >60)
Glucose, Bld: 116 mg/dL — ABNORMAL HIGH (ref 70–99)
Potassium: 4.2 mmol/L (ref 3.5–5.1)
Sodium: 138 mmol/L (ref 135–145)

## 2020-03-23 LAB — TROPONIN I (HIGH SENSITIVITY)
Troponin I (High Sensitivity): 7 ng/L (ref <18)
Troponin I (High Sensitivity): 6 ng/L (ref <18)

## 2020-03-23 MED ORDER — METHOCARBAMOL 500 MG PO TABS
500.0000 mg | ORAL_TABLET | Freq: Once | ORAL | Status: AC
  Administered 2020-03-23: 500 mg via ORAL
  Filled 2020-03-23 (×2): qty 1

## 2020-03-23 MED ORDER — ONDANSETRON HCL 4 MG/2ML IJ SOLN
4.0000 mg | Freq: Four times a day (QID) | INTRAMUSCULAR | Status: DC | PRN
  Administered 2020-03-23 – 2020-03-24 (×3): 4 mg via INTRAVENOUS
  Filled 2020-03-23 (×3): qty 2

## 2020-03-23 MED ORDER — METHOCARBAMOL 500 MG PO TABS
500.0000 mg | ORAL_TABLET | Freq: Three times a day (TID) | ORAL | Status: AC | PRN
  Administered 2020-03-23 – 2020-03-24 (×2): 500 mg via ORAL
  Filled 2020-03-23 (×3): qty 1

## 2020-03-23 MED ORDER — ONDANSETRON HCL 4 MG PO TABS: 4.0000 mg | ORAL_TABLET | Freq: Four times a day (QID) | ORAL | Status: DC | PRN

## 2020-03-23 MED ORDER — HYDROCHLOROTHIAZIDE 25 MG PO TABS
25.0000 mg | ORAL_TABLET | Freq: Every day | ORAL | Status: DC
  Administered 2020-03-23 – 2020-03-30 (×8): 25 mg via ORAL
  Filled 2020-03-23 (×8): qty 1

## 2020-03-23 MED ORDER — SODIUM CHLORIDE 0.9 % IV SOLN: INTRAVENOUS | Status: AC

## 2020-03-23 MED ORDER — METOPROLOL TARTRATE 5 MG/5ML IV SOLN
5.0000 mg | INTRAVENOUS | Status: DC | PRN
  Filled 2020-03-23: qty 5

## 2020-03-23 MED ORDER — PRAVASTATIN SODIUM 20 MG PO TABS
20.0000 mg | ORAL_TABLET | Freq: Every day | ORAL | Status: DC
  Administered 2020-03-23 – 2020-03-29 (×6): 20 mg via ORAL
  Filled 2020-03-23 (×7): qty 1

## 2020-03-23 MED ORDER — AMLODIPINE BESYLATE 10 MG PO TABS
10.0000 mg | ORAL_TABLET | Freq: Every day | ORAL | Status: DC
  Administered 2020-03-24 – 2020-03-30 (×7): 10 mg via ORAL
  Filled 2020-03-23 (×7): qty 1

## 2020-03-23 MED ORDER — GABAPENTIN 300 MG PO CAPS
300.0000 mg | ORAL_CAPSULE | Freq: Every day | ORAL | Status: DC
Start: 2020-03-23 — End: 2020-03-30
  Administered 2020-03-23 – 2020-03-30 (×8): 300 mg via ORAL
  Filled 2020-03-23 (×8): qty 1

## 2020-03-23 MED ORDER — MORPHINE SULFATE (PF) 4 MG/ML IV SOLN
4.0000 mg | INTRAVENOUS | Status: DC | PRN
  Filled 2020-03-23: qty 1

## 2020-03-23 MED ORDER — ENOXAPARIN SODIUM 40 MG/0.4ML ~~LOC~~ SOLN
40.0000 mg | SUBCUTANEOUS | Status: DC
  Administered 2020-03-23: 40 mg via SUBCUTANEOUS
  Filled 2020-03-23: qty 0.4

## 2020-03-23 MED ORDER — FENTANYL CITRATE (PF) 100 MCG/2ML IJ SOLN
50.0000 ug | INTRAMUSCULAR | Status: AC | PRN
  Administered 2020-03-23 – 2020-03-25 (×3): 50 ug via INTRAVENOUS
  Filled 2020-03-23 (×3): qty 2

## 2020-03-23 MED ORDER — HYDROCODONE-ACETAMINOPHEN 10-325 MG PO TABS
1.0000 | ORAL_TABLET | Freq: Four times a day (QID) | ORAL | Status: AC | PRN
  Administered 2020-03-23 – 2020-03-24 (×2): 1 via ORAL
  Filled 2020-03-23 (×2): qty 1

## 2020-03-23 MED ORDER — ACETAMINOPHEN 325 MG PO TABS
325.0000 mg | ORAL_TABLET | Freq: Four times a day (QID) | ORAL | Status: AC | PRN
  Administered 2020-03-24 – 2020-03-27 (×4): 325 mg via ORAL
  Filled 2020-03-23 (×4): qty 1

## 2020-03-23 MED ORDER — FENTANYL CITRATE (PF) 100 MCG/2ML IJ SOLN: 50.0000 ug | INTRAMUSCULAR | Status: DC | PRN

## 2020-03-23 MED ORDER — MORPHINE SULFATE (PF) 4 MG/ML IV SOLN
4.0000 mg | INTRAVENOUS | Status: AC | PRN
  Administered 2020-03-23 – 2020-03-24 (×4): 4 mg via INTRAVENOUS
  Filled 2020-03-23 (×3): qty 1

## 2020-03-23 MED ORDER — ACETAMINOPHEN 500 MG PO TABS
1000.0000 mg | ORAL_TABLET | Freq: Once | ORAL | Status: AC
  Administered 2020-03-23: 1000 mg via ORAL
  Filled 2020-03-23: qty 2

## 2020-03-23 MED ORDER — ACETAMINOPHEN 650 MG RE SUPP
325.0000 mg | Freq: Four times a day (QID) | RECTAL | Status: AC | PRN
  Filled 2020-03-23: qty 1

## 2020-03-23 MED ORDER — BENAZEPRIL HCL 20 MG PO TABS
20.0000 mg | ORAL_TABLET | Freq: Every day | ORAL | Status: DC
  Administered 2020-03-23 – 2020-03-30 (×8): 20 mg via ORAL
  Filled 2020-03-23 (×8): qty 1

## 2020-03-23 MED ORDER — LEVOTHYROXINE SODIUM 88 MCG PO TABS
88.0000 ug | ORAL_TABLET | Freq: Every day | ORAL | Status: DC
  Administered 2020-03-24 – 2020-03-29 (×6): 88 ug via ORAL
  Filled 2020-03-23 (×8): qty 1

## 2020-03-23 NOTE — H&P (Addendum)
History and Physical   Theresa Snyder JSE:[id number] DOB: May 24, 1929 DOA: 03/23/2020  PCP: [facility] [place]  Outpatient Specialists: [facility] [place] Patient coming from: Home  I have personally briefly reviewed patient's old medical records in [place].  Chief Concern: Fall  HPI: Theresa Snyder is a 87 y.o. female with medical history significant for lumbar spinal stenosis,, hypothyroid, hyperlipidemia, presents to the emergency department for chief concerns of life fall and worsening back pain.  She walked to mail box and fell face first and hit her chest 03/22/20. She then got up and walked back to her car and drove up her driveway to her house. She normally uses the rollator walker to ambulate however she didn't use her walker to go to the mail box yesterday when she fell.   She went to her car and took it easy yesterday and slept on her couch last night. She took a hydrocodon 10 mg once and went to bed. She denies feeling numbness in her legs or gluteal.   She presents to the ED because of chest pain and shortness of breath with inhalation.   She has never felt this way before. She took two 10 mg hydrocodone today.  Injections on 03/19/20 to L4 an L5 in the facets for pain management.  Vaccination: Patient has been vaccinated x2.  No booster for COVID-19  ROS: Constitutional: no weight change, no fever ENT/Mouth: no sore throat, no rhinorrhea Eyes: no eye pain, no vision changes Cardiovascular: + chest pain, + dyspnea,  no edema, no palpitations Respiratory: no cough, no sputum, no wheezing Gastrointestinal: no nausea, no vomiting, no diarrhea, no constipation Genitourinary: no urinary incontinence, no dysuria, no hematuria Musculoskeletal: + arthralgias, no myalgias, back pain Skin: no skin lesions, no pruritus, Neuro: + weakness, no loss of consciousness, no syncope Psych: no anxiety, no depression, + decrease appetite Heme/Lymph: no  bruising, no bleeding  ED Course: Discussed with ED provider, patient requiring hospitalization for pain control.  ED provider discussed with neurosurgery, Dr. Lacinda Axon, and he discussed options with the patient including fusion and stabilization versus bracing and medical management.  Assessment/Plan  Principal Problem:   L4 vertebral fracture (HCC) Active Problems:   Chronic kidney disease   BP (high blood pressure)   Adult hypothyroidism   HLD (hyperlipidemia)   Fall   Acute L4 compression fracture-present on admission secondary to mechanical fall -With mild retrial portion involving the posterior superior aspect of the vertebral body with 30% canal compromise and read by radiologist as fractures likely unstable with bilateral pedicle fractures. -Neurosurgery consulted and recommends at this time bracing -Dr. Lacinda Axon recommends LSO brace in place, patient will need upright x-rays when up in brace to ensure no instability -Can ambulate with PT once LSO brace is in place and there is no instability on x-ray -Possibility of surgery is approximately 50% per neurosurgery -Fall precautions -Pain control Norco 10 mg every 6 hours as needed for moderate pain, morphine 4 mg IV every 3 hours as needed for moderate pain, fentanyl 50 mcg IV every 2 hours as needed for severe pain not relieved by morphine -Robaxin 500 mg PO q8h prn for muscle spasms -Fall precautions  Hypertension-resumed amlodipine 10 mg daily, benazepril 20 mg p.o. daily, hydrochlorothiazide 25 mg p.o. daily, metoprolol tartrate 5 mg IV every 4 hours as needed for SBP  Hyperlipidemia-pravastatin 20 mg p.o. daily at bedtime Hypothyroid-levothyroxine 88 mcg resumed Neuropathy-gabapentin 300 mg p.o. daily resumed  CKD 3b  Outside vendor brace-LSO, spine, has been ordered  Chart reviewed.   As needed meds: Acetaminophen, ondansetron  DVT prophylaxis: Enoxaparin 40 mg subcutaneous every 24 hours, TED hose Code Status: Full  code Diet: Heart healthy Family Communication: Discussed with daughter at bedside Disposition Plan: Pending clinical course Consults called: Neurosurgery Admission status: Observation to MedSurg  Past Medical History:  Diagnosis Date  . Arthritis   . Cancer ([place])    skin  . Chronic kidney disease    history of renal insufficiency  . History of hiatal hernia   . Hypercholesteremia   . Hypertension   . Hypothyroidism   . Lymphedema of leg   . Pneumonia    in the past  . Seizures ([place]) 2014   no seizures since then  . Spinal stenosis    Past Surgical History:  Procedure Laterality Date  . APPENDECTOMY    . CATARACT EXTRACTION W/PHACO Left 09/25/2015   Procedure: CATARACT EXTRACTION PHACO AND INTRAOCULAR LENS PLACEMENT (IOC);  Surgeon: Estill Cotta, MD;  Location: [facility] ORS;  Service: Ophthalmology;  Laterality: Left;  [place] 01:23 AP% 24.0 CDE 34.98 Fluid pack lot # [id number]  . CATARACT EXTRACTION W/PHACO Right 07/01/2016   Procedure: CATARACT EXTRACTION PHACO AND INTRAOCULAR LENS PLACEMENT (IOC);  Surgeon: Estill Cotta, MD;  Location: [facility] ORS;  Service: Ophthalmology;  Laterality: Right;  [place] 1:25.9 AP% 24.8 CDE 40.61 Fluid Pack Lot # [id number]  . TUBAL LIGATION     Social History:  reports that she has never smoked. She has never used smokeless tobacco. She reports that she does not drink alcohol and does not use drugs.  Allergies  Allergen Reactions  . Iodinated Diagnostic Agents Other (See Comments)    Blue, Green and Red dyes   . Lorazepam     Altered mental state  . Risperidone Other (See Comments)    Altered mental state   . Sulfamethoxazole-Trimethoprim     Hyper   No family history on file. Family history: Family history reviewed and not pertinent  Prior to Admission medications   Medication Sig Start Date End Date Taking? Authorizing Provider  amLODipine (NORVASC) 10 MG tablet Take 10 mg by mouth daily with breakfast. for high blood pressure 01/12/15    [provider]  aspirin EC 81 MG tablet Take 81 mg by mouth daily with breakfast.     [provider]  benazepril (LOTENSIN) 20 MG tablet Take 20mg s by mouth daily at breakfast 01/12/15   [provider]  cholecalciferol (VITAMIN D) 1000 units tablet Take 1,000 Units by mouth daily with lunch.    [provider]  gabapentin (NEURONTIN) 300 MG capsule Take 300mg s by mouth at bedtime, may take an additional 100mg s twice daily as needed 01/12/15   [provider]  hydrochlorothiazide (HYDRODIURIL) 25 MG tablet Take 25mg s by mouth daily at breakfast 08/15/12   [provider]  levothyroxine (SYNTHROID, LEVOTHROID) 88 MCG tablet Take 44mcg by mouth daily before breakfast 01/12/15   [provider]  meclizine (ANTIVERT) 25 MG tablet Take 12.5mg s to 25mg s by mouth once daily as needed for dizziness 02/14/15   [provider]  pravastatin (PRAVACHOL) 20 MG tablet Take 20 mg by mouth at bedtime.     [provider]   Physical Exam: Vitals:   03/23/20 1530 03/23/20 1735 03/23/20 1823 03/23/20 2055  BP: (!) 158/76 (!) 176/97 (!) 186/81 (!) 161/84  Pulse: 77 99 99 (!) 101  Resp: 19 19 18 16   Temp:  97.8  F (36.6 C) 98.7 F (37.1 C) 98.2 F (36.8 C)  TempSrc:  Oral Oral Oral  SpO2: 94% 98% 98% 93%  Weight:      Height:       Constitutional: appears age-appropriate, NAD, calm, comfortable Eyes: PERRL, lids and conjunctivae normal ENMT: Mucous membranes are moist. Posterior pharynx clear of any exudate or lesions. Age-appropriate dentition. Hearing appropriate Neck: normal, supple, no masses, no thyromegaly Respiratory: clear to auscultation bilaterally, no wheezing, no crackles. Normal respiratory effort. No accessory muscle use.  Cardiovascular: Regular rate and rhythm, no murmurs / rubs / gallops. No extremity edema. 2+ pedal pulses. No carotid bruits.  Abdomen: no tenderness, no masses palpated, no hepatosplenomegaly.  Bowel sounds positive.  Musculoskeletal: no clubbing / cyanosis. No joint deformity upper and lower extremities. Good ROM, no contractures, no atrophy. Normal muscle tone.  Skin: no rashes, lesions, ulcers. No induration Neurologic: Sensation intact. Strength 5/5 in all 4.  Psychiatric: Normal judgment and insight. Alert and oriented x 3. Normal mood.   EKG: independently reviewed, showing sinus rhythm, irregular, rate of 74, QTc 435  Chest x-ray on Admission: I personally reviewed and I agree with radiologist reading as below.  DG Chest 2 View  Result Date: 03/23/2020 CLINICAL DATA:  Pain following fall EXAM: CHEST - 2 VIEW COMPARISON:  October 09, 2017 FINDINGS: There is apparent scarring in the left base. No edema or airspace opacity. Heart is upper normal in size with pulmonary vascularity normal. There is an apparent hiatal hernia. No adenopathy. There is aortic atherosclerosis. No pneumothorax. Bones are osteoporotic. There is degenerative change in the thoracic spine. No appreciable acute fracture. IMPRESSION: Scarring left base. No edema or airspace opacity. Heart upper normal in size. Hiatal hernia present. Bones osteoporotic without fracture demonstrable. Aortic Atherosclerosis (ICD10-I70.0). Electronically Signed   By: Lowella Grip III M.D.   On: 03/23/2020 10:49   DG Lumbar Spine Complete  Result Date: 03/23/2020 CLINICAL DATA:  Pain following fall EXAM: LUMBAR SPINE - COMPLETE 4+ VIEW COMPARISON:  July 23, 2016. FINDINGS: Frontal, lateral, spot lumbosacral lateral, and bilateral oblique views were obtained. There are 5 non-rib-bearing lumbar type vertebral bodies. Bones are osteoporotic. There is a compression type fracture along the superior aspect of the L4 vertebral body with superior endplate collapse. This fracture extends into the posterior aspect of the vertebral body with a suspected focus of bony retropulsion into the anterior thecal sac. This fracture was not present  previously and appears potentially acute. No other evident fracture. There is 3 mm of anterolisthesis of L4 on L5, a finding not present previously. Elsewhere there is disc space narrowing at L1-2 as well as in the lower thoracic region. There is stable 2 mm of retrolisthesis of L1 on L2. There is facet osteoarthritic change at all levels bilaterally. There is aortic atherosclerosis. IMPRESSION: 1. There is a suspected acute fracture along the superior mid to posterior aspect of the L4 vertebral body with concern for retropulsion of bone into the anterior canal. There is new 3 mm of anterolisthesis of L4 on L5. This finding may warrant CT or MR to further evaluate, particularly with potential retropulsion of bone into the canal at this site. 2.  Diffuse osteoporosis. 3.  Multifocal osteoarthritic change, stable. 4.  Aortic Atherosclerosis (ICD10-I70.0). Electronically Signed   By: Lowella Grip III M.D.   On: 03/23/2020 10:52   CT Head Wo Contrast  Result Date: 03/23/2020 CLINICAL DATA:  Fall at home yesterday with chest injury. EXAM: CT  HEAD WITHOUT CONTRAST TECHNIQUE: Contiguous axial images were obtained from the base of the skull through the vertex without intravenous contrast. COMPARISON:  10/09/2017 head CT. FINDINGS: Brain: No evidence of parenchymal hemorrhage or extra-axial fluid collection. No mass lesion, mass effect, or midline shift. No CT evidence of acute infarction. Nonspecific mild subcortical and periventricular white matter hypodensity, most in keeping with chronic small vessel ischemic change. Cerebral volume is age appropriate. No ventriculomegaly. Vascular: No acute abnormality. Skull: No evidence of calvarial fracture. Sinuses/Orbits: The visualized paranasal sinuses are essentially clear. Other:  The mastoid air cells are unopacified. IMPRESSION: 1. No evidence of acute intracranial abnormality. No evidence of calvarial fracture. 2. Mild chronic small vessel ischemic changes in the  cerebral white matter. Electronically Signed   By: Ilona Sorrel M.D.   On: 03/23/2020 12:26   CT L-SPINE NO CHARGE  Result Date: 03/23/2020 CLINICAL DATA:  [place] at home. EXAM: CT LUMBAR SPINE WITHOUT CONTRAST TECHNIQUE: Multidetector CT imaging of the lumbar spine was performed without intravenous contrast administration. Multiplanar CT image reconstructions were also generated. COMPARISON:  Lumbar radiographs, same date. Prior MRI lumbar spine 06/30/2018 FINDINGS: Segmentation: There are five lumbar type vertebral bodies. The last full intervertebral disc space is labeled L5-S1. This correlates with the lumbar radiographs and prior MRI. Alignment: Degenerative anterolisthesis of L4 is a stable finding. Is also mild degenerative retrolisthesis of L1 Vertebrae: There is an acute fracture of L4. Superior endplate compression fracture estimated at 40%. There is mild retropulsion involving the posterosuperior aspect of vertebral body with 30% canal compromise. Fractures likely unstable with bilateral pedicle fractures. The facets are maintained. Suspect small fracture involving the superior articulating facet on the right side. The other vertebral bodies are intact. No pars defects or lamina fractures. Paraspinal and other soft tissues: Small paraspinal hematoma at L4. Advanced vascular calcifications but no aortic aneurysm. Disc levels: Advanced degenerative lumbar spondylosis with multilevel disc disease and facet disease. Significant multifactorial spinal and bilateral lateral recess stenosis at L3-4 and L4-5. IMPRESSION: 1. Acute L4 compression fracture estimated at 40%. There is mild retropulsion involving the posterosuperior aspect of vertebral body with 30% canal compromise. Fractures likely unstable with bilateral pedicle fractures. Suspect small fracture involving the superior articulating facet on the right side. 2. Advanced degenerative lumbar spondylosis with multilevel disc disease and facet disease.  Significant multifactorial spinal and bilateral lateral recess stenosis at L3-4 and L4-5. 3. Aortic atherosclerosis. Aortic Atherosclerosis (ICD10-I70.0). Electronically Signed   By: Marijo Sanes M.D.   On: 03/23/2020 12:23   CT CHEST ABDOMEN PELVIS WO CONTRAST  Result Date: 03/23/2020 CLINICAL DATA:  Left-sided chest pain after fall. EXAM: CT CHEST, ABDOMEN AND PELVIS WITHOUT CONTRAST TECHNIQUE: Multidetector CT imaging of the chest, abdomen and pelvis was performed following the standard protocol without IV contrast. COMPARISON:  MRI lumbar spine dated Jun 30, 2018. CT chest, abdomen, and pelvis dated November 03, 2012. FINDINGS: CT CHEST FINDINGS Cardiovascular: No significant vascular findings. Normal heart size. No pericardial effusion. No thoracic aortic aneurysm. Coronary, aortic arch, and branch vessel atherosclerotic vascular disease. Mediastinum/Nodes: No enlarged mediastinal, hilar, or axillary lymph nodes. Thyroid gland, trachea, and esophagus demonstrate no significant findings. Unchanged large hiatal hernia containing the entire stomach. Lungs/Pleura: Trace bilateral pleural effusions. Chronic subsegmental atelectasis in the lingula and both lower lobes. No consolidation or pneumothorax. Musculoskeletal: Acute nondisplaced fractures of the left anterior second through fourth ribs and right anterior third through fifth ribs. Chronic nondisplaced fracture of the left anterior fifth rib and  right second through sixth ribs. Chronic fracture deformity of the right scapula. CT ABDOMEN PELVIS FINDINGS Hepatobiliary: No focal liver abnormality is seen. No gallstones, gallbladder wall thickening, or biliary dilatation. Pancreas: Unremarkable. No pancreatic ductal dilatation or surrounding inflammatory changes. Spleen: Normal in size without focal abnormality. Adrenals/Urinary Tract: The adrenal glands are unremarkable. Right renal simple cysts measuring up to 1.9 cm. Mild asymmetric atrophy of the left  kidney. No renal calculi or hydronephrosis. The bladder is unremarkable. Stomach/Bowel: Large hiatal hernia again noted containing the entire stomach. No bowel wall thickening, distention, or surrounding inflammatory changes. Mild sigmoid colonic diverticulosis. Prior appendectomy. Vascular/Lymphatic: Aortic atherosclerosis. No enlarged abdominal or pelvic lymph nodes. Reproductive: The uterus and left ovary are unremarkable. Unchanged 2.4 cm simple appearing cyst in the right ovary, stable since 2014. Other: No abdominal wall hernia or abnormality. No abdominopelvic ascites. No pneumoperitoneum. Musculoskeletal: New acute L4 superior endplate compression fracture with up to 40% height loss centrally. The fracture extends into the bilateral pedicles and the right superior articular process. Minimal retropulsion of the posterosuperior endplate. Severe spinal canal stenosis and 5 mm anterolisthesis at this level are unchanged. IMPRESSION: Chest: 1. Acute nondisplaced fractures of the left anterior second through fourth ribs and right anterior third through fifth ribs. 2. Trace bilateral pleural effusions.  No pneumothorax. 3. Unchanged large hiatal hernia containing the entire stomach. Abdomen and pelvis: 1. Acute three column fracture of the L4 vertebral body and posterior elements. Minimal retropulsion of the posterosuperior endplate. Chronic severe spinal canal stenosis at this level. 2. Unchanged 2.4 cm simple appearing right ovarian cyst, stable since 2014. No follow-up imaging recommended. 3. Aortic Atherosclerosis (ICD10-I70.0). Electronically Signed   By: Titus Dubin M.D.   On: 03/23/2020 12:42   Labs on Admission: I have personally reviewed following labs  CBC: Recent Labs  Lab 03/23/20 1005  WBC 10.2  NEUTROABS 7.5  HGB 13.1  HCT 37.9  MCV 94.5  PLT 660   Basic Metabolic Panel: Recent Labs  Lab 03/23/20 1005  NA 138  K 4.2  CL 102  CO2 23  GLUCOSE 116*  BUN 27*  CREATININE 1.53*   CALCIUM 9.3   GFR: Estimated Creatinine Clearance: 21.9 mL/min (A) (by C-G formula based on SCr of 1.53 mg/dL (H)).  Urine analysis:    Component Value Date/Time   COLORURINE YELLOW (A) 10/09/2017 2124   APPEARANCEUR HAZY (A) 10/09/2017 2124   APPEARANCEUR Clear 11/21/2012 2330   LABSPEC 1.006 10/09/2017 2124   LABSPEC 1.004 11/21/2012 2330   PHURINE 5.0 10/09/2017 2124   GLUCOSEU NEGATIVE 10/09/2017 2124   GLUCOSEU Negative 11/21/2012 2330   HGBUR NEGATIVE 10/09/2017 2124   BILIRUBINUR NEGATIVE 10/09/2017 2124   BILIRUBINUR Negative 11/21/2012 [place] 10/09/2017 2124   PROTEINUR NEGATIVE 10/09/2017 2124   NITRITE NEGATIVE 10/09/2017 2124   LEUKOCYTESUR TRACE (A) 10/09/2017 2124   LEUKOCYTESUR Negative 11/21/2012 2330   Ronni Osterberg N Yariah Selvey D.O. [facility]  If 7PM-7AM, please contact overnight-coverage provider If 7AM-7PM, please contact day coverage provider [email or web address]  03/23/2020, 9:38 PM

## 2020-03-23 NOTE — Consult Note (Signed)
Neurosurgery-New Consultation Evaluation 03/23/2020 Theresa Snyder [id number]  Identifying Statement: Theresa Snyder is a 87 y.o. female from [place] [id number] with back pain  Physician Requesting Consultation: [facility] Emergency Department  History of Present Illness: Ms Klinkner presents to the ED with back and chest pain after a fall yesterday. She was walking without her walker and fell. She was able to get up and walk again without pain. Today, however she is here with back and chest pain. She denies any leg pain, numbness , or weakness. She had imaging which showed rib fractures and a L4 compression fracture. Given this, we are consulted for evaluation.   Past Medical History:  Past Medical History:  Diagnosis Date  . Arthritis   . Cancer ([place])    skin  . Chronic kidney disease    history of renal insufficiency  . History of hiatal hernia   . Hypercholesteremia   . Hypertension   . Hypothyroidism   . Lymphedema of leg   . Pneumonia    in the past  . Seizures ([place]) 2014   no seizures since then  . Spinal stenosis     Social History: Social History   Socioeconomic History  . Marital status: Divorced    Spouse name: Not on file  . Number of children: Not on file  . Years of education: Not on file  . Highest education level: Not on file  Occupational History  . Not on file  Tobacco Use  . Smoking status: Never Smoker  . Smokeless tobacco: Never Used  Substance and Sexual Activity  . Alcohol use: No    Alcohol/week: 0.0 standard drinks  . Drug use: No  . Sexual activity: Not on file  Other Topics Concern  . Not on file  Social History Narrative  . Not on file   Social Determinants of Health   Financial Resource Strain: Not on file  Food Insecurity: Not on file  Transportation Needs: Not on file  Physical Activity: Not on file  Stress: Not on file  Social Connections: Not on file  Intimate Partner Violence: Not on file     Family  History: No family history on file.  Review of Systems:  Review of Systems - General ROS: Negative Psychological ROS: Negative Ophthalmic ROS: Negative ENT ROS: Negative Hematological and Lymphatic ROS: Negative  Endocrine ROS: Negative Respiratory ROS: Negative Cardiovascular ROS: Negative Gastrointestinal ROS: Negative Genito-Urinary ROS: Negative Musculoskeletal ROS: Positive for back pain Neurological ROS: Negative for leg pain, numbness, weakness Dermatological ROS: Negative  Physical Exam: BP (!) 186/81 (BP Location: Right Arm)   Pulse 99   Temp 98.7 F (37.1 C) (Oral)   Resp 18   Ht 5' (1.524 m)   Wt 68 kg   SpO2 98%   BMI 29.29 kg/m  Body mass index is 29.29 kg/m. Body surface area is 1.7 meters squared. General appearance: Alert, cooperative, in no acute distress Head: Normocephalic, atraumatic Eyes: Normal, EOM intact Ext: Edema noted in bilateral legs  Neurologic exam:  Mental status: alertness: alert, affect: normal Speech: fluent and clear Motor:strength symmetric 5/5 in bilateral hip flexion, knee extension, dorsiflexion, plantar flexion Sensory: intact to light touch in all bilateral lower extremities Gait: not tested given injury  Laboratory: Results for orders placed or performed during the hospital encounter of 03/23/20  CBC with Differential/Platelet  Result Value Ref Range   WBC 10.2 4.0 - 10.5 K/uL   RBC 4.01 3.87 - 5.11 MIL/uL   Hemoglobin  13.1 12.0 - 15.0 g/dL   HCT 37.9 36.0 - 46.0 %   MCV 94.5 80.0 - 100.0 fL   MCH 32.7 26.0 - 34.0 pg   MCHC 34.6 30.0 - 36.0 g/dL   RDW 11.9 11.5 - 15.5 %   Platelets 286 150 - 400 K/uL   nRBC 0.0 0.0 - 0.2 %   Neutrophils Relative % 74 %   Neutro Abs 7.5 1.7 - 7.7 K/uL   Lymphocytes Relative 17 %   Lymphs Abs 1.7 0.7 - 4.0 K/uL   Monocytes Relative 8 %   Monocytes Absolute 0.8 0.1 - 1.0 K/uL   Eosinophils Relative 1 %   Eosinophils Absolute 0.1 0.0 - 0.5 K/uL   Basophils Relative 0 %    Basophils Absolute 0.0 0.0 - 0.1 K/uL   Immature Granulocytes 0 %   Abs Immature Granulocytes 0.04 0.00 - 0.07 K/uL  Basic metabolic panel  Result Value Ref Range   Sodium 138 135 - 145 mmol/L   Potassium 4.2 3.5 - 5.1 mmol/L   Chloride 102 98 - 111 mmol/L   CO2 23 22 - 32 mmol/L   Glucose, Bld 116 (H) 70 - 99 mg/dL   BUN 27 (H) 8 - 23 mg/dL   Creatinine, Ser 1.53 (H) 0.44 - 1.00 mg/dL   Calcium 9.3 8.9 - 10.3 mg/dL   GFR, Estimated 33 (L) >60 mL/min   Anion gap 13 5 - 15  Troponin I (High Sensitivity)  Result Value Ref Range   Troponin I (High Sensitivity) 7 <18 ng/L  Troponin I (High Sensitivity)  Result Value Ref Range   Troponin I (High Sensitivity) 6 <18 ng/L   I personally reviewed labs  Imaging: CT Lumbar Spine: Acute L4 compression fracture estimated at 40%. There is mild retropulsion involving the posterosuperior aspect of vertebral body with 30% canal compromise. Fractures with bilateral pedicle fractures. Suspect small fracture involving the superior articulating facet on the right side. 2. Advanced degenerative lumbar spondylosis with multilevel disc disease and facet disease. Significant multifactorial spinal and bilateral lateral recess stenosis at L3-4 and L4-5.  Impression/Plan:  I discussed with Ms Lannan the imaging findings and the nature of her fracture. It is reassuring that she was able to ambulate and she has no neurologic symptoms. We discussed the options of surgery for fusion and stabilization vs bracing and medical management. I think there is a 50% chance she will need surgery but she would like to try bracing as she feels the risk of surgery is high for her age.   Recommend LSO Brace at all times, can ambulate with PT once brace is on. Will need upright xrays when up in brace to ensure no instability    1.  Diagnosis: L4 fracture  2.  Plan  - LSO brace, get upright lumbar spine xrays when in brace for review

## 2020-03-23 NOTE — Plan of Care (Signed)
  Problem: Education: Goal: Knowledge of General Education information will improve Description: Including pain rating scale, medication(s)/side effects and non-pharmacologic comfort measures 03/23/2020 1744 by Cristela Blue, RN Outcome: Progressing 03/23/2020 1744 by Cristela Blue, RN Outcome: Progressing   Problem: Health Behavior/Discharge Planning: Goal: Ability to manage health-related needs will improve 03/23/2020 1744 by Cristela Blue, RN Outcome: Progressing 03/23/2020 1744 by Cristela Blue, RN Outcome: Progressing   Problem: Clinical Measurements: Goal: Ability to maintain clinical measurements within normal limits will improve 03/23/2020 1744 by Cristela Blue, RN Outcome: Progressing 03/23/2020 1744 by Cristela Blue, RN Outcome: Progressing Goal: Will remain free from infection 03/23/2020 1744 by Cristela Blue, RN Outcome: Progressing 03/23/2020 1744 by Cristela Blue, RN Outcome: Progressing Goal: Diagnostic test results will improve 03/23/2020 1744 by Cristela Blue, RN Outcome: Progressing 03/23/2020 1744 by Cristela Blue, RN Outcome: Progressing Goal: Respiratory complications will improve 03/23/2020 1744 by Cristela Blue, RN Outcome: Progressing 03/23/2020 1744 by Cristela Blue, RN Outcome: Progressing Goal: Cardiovascular complication will be avoided 03/23/2020 1744 by Cristela Blue, RN Outcome: Progressing 03/23/2020 1744 by Cristela Blue, RN Outcome: Progressing   Problem: Activity: Goal: Risk for activity intolerance will decrease 03/23/2020 1744 by Cristela Blue, RN Outcome: Progressing 03/23/2020 1744 by Cristela Blue, RN Outcome: Progressing   Problem: Nutrition: Goal: Adequate nutrition will be maintained 03/23/2020 1744 by Cristela Blue, RN Outcome: Progressing 03/23/2020 1744 by Cristela Blue, RN Outcome: Progressing   Problem: Coping: Goal: Level of anxiety will decrease 03/23/2020 1744 by Cristela Blue, RN Outcome:  Progressing 03/23/2020 1744 by Cristela Blue, RN Outcome: Progressing   Problem: Elimination: Goal: Will not experience complications related to bowel motility 03/23/2020 1744 by Cristela Blue, RN Outcome: Progressing 03/23/2020 1744 by Cristela Blue, RN Outcome: Progressing Goal: Will not experience complications related to urinary retention 03/23/2020 1744 by Cristela Blue, RN Outcome: Progressing 03/23/2020 1744 by Cristela Blue, RN Outcome: Progressing   Problem: Pain Managment: Goal: General experience of comfort will improve 03/23/2020 1744 by Cristela Blue, RN Outcome: Progressing 03/23/2020 1744 by Cristela Blue, RN Outcome: Progressing   Problem: Safety: Goal: Ability to remain free from injury will improve 03/23/2020 1744 by Cristela Blue, RN Outcome: Progressing 03/23/2020 1744 by Cristela Blue, RN Outcome: Progressing   Problem: Skin Integrity: Goal: Risk for impaired skin integrity will decrease 03/23/2020 1744 by Cristela Blue, RN Outcome: Progressing 03/23/2020 1744 by Cristela Blue, RN Outcome: Progressing

## 2020-03-23 NOTE — ED Notes (Signed)
Placed purewick on patient  

## 2020-03-23 NOTE — ED Provider Notes (Signed)
[facility] Emergency Department Provider Note ____________________________________________   Event Date/Time   First MD Initiated Contact with Patient 03/23/20 [phone]     (approximate)  I have reviewed the triage vital signs and the nursing notes.  HISTORY  Chief Complaint Fall and Chest Pain   HPI Theresa Snyder is a 87 y.o. femalewho presents to the ED for evaluation of chest pain after a fall.   Chart review indicates hx CKD, HTN, HLD, hypothyroidism.  Patient takes no regular antithrombotic medications. Patient lives at home and is ambulatory independently.  Patient reports being in her typical state of health until falling accidentally yesterday afternoon.  She reports walking to get her mail when she "just fell" forward, causing her to face plant onto the ground.  She reports a small ditch by her driveway, such that she did not hit her head, but the majority of the trauma landed on her chest wall.  She reports substernal and left-sided chest pain starting last night, and progressively worsening throughout the night and this morning.  She reports difficulty sleeping due to her chest pain.  She has not taken any medications for this prior to arrival.  Denies any syncopal episodes, emesis, additional falls.  Does report acute lumbar pain during examination.  Reports that she has ambulated today, although slowly.  Reports that she was able to tolerate breakfast this morning without abdominal pain or emesis.  Past Medical History:  Diagnosis Date  . Arthritis   . Cancer ([place])    skin  . Chronic kidney disease    history of renal insufficiency  . History of hiatal hernia   . Hypercholesteremia   . Hypertension   . Hypothyroidism   . Lymphedema of leg   . Pneumonia    in the past  . Seizures ([place]) 2014   no seizures since then  . Spinal stenosis     Patient Active Problem List   Diagnosis Date Noted  . Chronic kidney disease 03/04/2015  . Adaptive  colitis 03/04/2015  . BP (high blood pressure) 05/18/2013  . Adult hypothyroidism 05/18/2013  . HLD (hyperlipidemia) 05/18/2013    Past Surgical History:  Procedure Laterality Date  . APPENDECTOMY    . CATARACT EXTRACTION W/PHACO Left 09/25/2015   Procedure: CATARACT EXTRACTION PHACO AND INTRAOCULAR LENS PLACEMENT (IOC);  Surgeon: Estill Cotta, MD;  Location: [facility] ORS;  Service: Ophthalmology;  Laterality: Left;  [place] 01:23 AP% 24.0 CDE 34.98 Fluid pack lot # [id number]  . CATARACT EXTRACTION W/PHACO Right 07/01/2016   Procedure: CATARACT EXTRACTION PHACO AND INTRAOCULAR LENS PLACEMENT (IOC);  Surgeon: Estill Cotta, MD;  Location: [facility] ORS;  Service: Ophthalmology;  Laterality: Right;  [place] 1:25.9 AP% 24.8 CDE 40.61 Fluid Pack Lot # [id number]  . TUBAL LIGATION      Prior to Admission medications   Medication Sig Start Date End Date Taking? Authorizing Provider  amLODipine (NORVASC) 10 MG tablet Take 10 mg by mouth daily with breakfast. for high blood pressure 01/12/15   [provider]  aspirin EC 81 MG tablet Take 81 mg by mouth daily with breakfast.     [provider]  benazepril (LOTENSIN) 20 MG tablet Take 20mg s by mouth daily at breakfast 01/12/15   [provider]  cholecalciferol (VITAMIN D) 1000 units tablet Take 1,000 Units by mouth daily with lunch.    [provider]  gabapentin (NEURONTIN) 300 MG capsule Take 300mg s by mouth at bedtime, may take an additional 100mg s twice daily  as needed 01/12/15   [provider]  hydrochlorothiazide (HYDRODIURIL) 25 MG tablet Take 25mg s by mouth daily at breakfast 08/15/12   [provider]  levothyroxine (SYNTHROID, LEVOTHROID) 88 MCG tablet Take 27mcg by mouth daily before breakfast 01/12/15   [provider]  meclizine (ANTIVERT) 25 MG tablet Take 12.5mg s to 25mg s by mouth once daily as needed for dizziness 02/14/15   [provider]  pravastatin (PRAVACHOL) 20 MG  tablet Take 20 mg by mouth at bedtime.     [provider]    Allergies Iodinated diagnostic agents, Lorazepam, Risperidone, and Sulfamethoxazole-trimethoprim  No family history on file.  Social History Social History   Tobacco Use  . Smoking status: Never Smoker  . Smokeless tobacco: Never Used  Substance Use Topics  . Alcohol use: No    Alcohol/week: 0.0 standard drinks  . Drug use: No    Review of Systems  Constitutional: No fever/chills Eyes: No visual changes. ENT: No sore throat. Cardiovascular: Positive chest pain Respiratory: Denies shortness of breath. Gastrointestinal: No abdominal pain.  No nausea, no vomiting.  No diarrhea.  No constipation. Genitourinary: Negative for dysuria. Musculoskeletal: Positive for lumbar pain after a fall. Skin: Negative for rash. Neurological: Negative for headaches, focal weakness or numbness.  ____________________________________________   PHYSICAL EXAM:  VITAL SIGNS: Vitals:   03/23/20 1230 03/23/20 1300  BP: (!) 148/74 (!) 167/76  Pulse: (!) 44 79  Resp: (!) 24 18  Temp:    SpO2: 95% 96%     Constitutional: Alert and oriented. Well appearing and in no acute distress. Eyes: Conjunctivae are normal. PERRL. EOMI. Head: Atraumatic. Nose: No congestion/rhinnorhea. Mouth/Throat: Mucous membranes are moist.  Oropharynx non-erythematous. Neck: No stridor. No cervical spine tenderness to palpation. Cardiovascular: Normal rate, regular rhythm. Grossly normal heart sounds.  Good peripheral circulation. Respiratory: Normal respiratory effort.  No retractions. Lungs CTAB. Gastrointestinal: Soft , nondistended, nontender to palpation. No CVA tenderness. Musculoskeletal: No lower extremity tenderness nor edema.  No joint effusions. No signs of acute trauma. Neurologic:  Normal speech and language. No gross focal neurologic deficits are appreciated. No gait instability noted. Skin:  Skin is warm, dry and intact. No rash  noted. Psychiatric: Mood and affect are normal. Speech and behavior are normal.  ____________________________________________   LABS (all labs ordered are listed, but only abnormal results are displayed)  Labs Reviewed  BASIC METABOLIC PANEL - Abnormal; Notable for the following components:      Result Value   Glucose, Bld 116 (*)    BUN 27 (*)    Creatinine, Ser 1.53 (*)    GFR, Estimated 33 (*)    All other components within normal limits  CBC WITH DIFFERENTIAL/PLATELET  TROPONIN I (HIGH SENSITIVITY)  TROPONIN I (HIGH SENSITIVITY)   ____________________________________________  12 Lead EKG  Sinus rhythm, rate of 74 bpm.  Normal axis and intervals.  No evidence of acute ischemia. ____________________________________________  RADIOLOGY  ED MD interpretation: Two-view CXR reviewed by me without evidence of acute cardiopulmonary pathology.  Plain film of the lumbar spine reviewed by me with L4 compression fracture. CT head reviewed by me without evidence of acute intracranial pathology. CT CAP reviewed by me with multiple rib fractures noted.  Official radiology report(s): DG Chest 2 View  Result Date: 03/23/2020 CLINICAL DATA:  Pain following fall EXAM: CHEST - 2 VIEW COMPARISON:  October 09, 2017 FINDINGS: There is apparent scarring in the left base. No edema or airspace opacity. Heart is upper normal in size  with pulmonary vascularity normal. There is an apparent hiatal hernia. No adenopathy. There is aortic atherosclerosis. No pneumothorax. Bones are osteoporotic. There is degenerative change in the thoracic spine. No appreciable acute fracture. IMPRESSION: Scarring left base. No edema or airspace opacity. Heart upper normal in size. Hiatal hernia present. Bones osteoporotic without fracture demonstrable. Aortic Atherosclerosis (ICD10-I70.0). Electronically Signed   By: Lowella Grip III M.D.   On: 03/23/2020 10:49   DG Lumbar Spine Complete  Result Date:  03/23/2020 CLINICAL DATA:  Pain following fall EXAM: LUMBAR SPINE - COMPLETE 4+ VIEW COMPARISON:  July 23, 2016. FINDINGS: Frontal, lateral, spot lumbosacral lateral, and bilateral oblique views were obtained. There are 5 non-rib-bearing lumbar type vertebral bodies. Bones are osteoporotic. There is a compression type fracture along the superior aspect of the L4 vertebral body with superior endplate collapse. This fracture extends into the posterior aspect of the vertebral body with a suspected focus of bony retropulsion into the anterior thecal sac. This fracture was not present previously and appears potentially acute. No other evident fracture. There is 3 mm of anterolisthesis of L4 on L5, a finding not present previously. Elsewhere there is disc space narrowing at L1-2 as well as in the lower thoracic region. There is stable 2 mm of retrolisthesis of L1 on L2. There is facet osteoarthritic change at all levels bilaterally. There is aortic atherosclerosis. IMPRESSION: 1. There is a suspected acute fracture along the superior mid to posterior aspect of the L4 vertebral body with concern for retropulsion of bone into the anterior canal. There is new 3 mm of anterolisthesis of L4 on L5. This finding may warrant CT or MR to further evaluate, particularly with potential retropulsion of bone into the canal at this site. 2.  Diffuse osteoporosis. 3.  Multifocal osteoarthritic change, stable. 4.  Aortic Atherosclerosis (ICD10-I70.0). Electronically Signed   By: Lowella Grip III M.D.   On: 03/23/2020 10:52   CT Head Wo Contrast  Result Date: 03/23/2020 CLINICAL DATA:  Fall at home yesterday with chest injury. EXAM: CT HEAD WITHOUT CONTRAST TECHNIQUE: Contiguous axial images were obtained from the base of the skull through the vertex without intravenous contrast. COMPARISON:  10/09/2017 head CT. FINDINGS: Brain: No evidence of parenchymal hemorrhage or extra-axial fluid collection. No mass lesion, mass effect, or  midline shift. No CT evidence of acute infarction. Nonspecific mild subcortical and periventricular white matter hypodensity, most in keeping with chronic small vessel ischemic change. Cerebral volume is age appropriate. No ventriculomegaly. Vascular: No acute abnormality. Skull: No evidence of calvarial fracture. Sinuses/Orbits: The visualized paranasal sinuses are essentially clear. Other:  The mastoid air cells are unopacified. IMPRESSION: 1. No evidence of acute intracranial abnormality. No evidence of calvarial fracture. 2. Mild chronic small vessel ischemic changes in the cerebral white matter. Electronically Signed   By: Ilona Sorrel M.D.   On: 03/23/2020 12:26   CT L-SPINE NO CHARGE  Result Date: 03/23/2020 CLINICAL DATA:  [place] at home. EXAM: CT LUMBAR SPINE WITHOUT CONTRAST TECHNIQUE: Multidetector CT imaging of the lumbar spine was performed without intravenous contrast administration. Multiplanar CT image reconstructions were also generated. COMPARISON:  Lumbar radiographs, same date. Prior MRI lumbar spine 06/30/2018 FINDINGS: Segmentation: There are five lumbar type vertebral bodies. The last full intervertebral disc space is labeled L5-S1. This correlates with the lumbar radiographs and prior MRI. Alignment: Degenerative anterolisthesis of L4 is a stable finding. Is also mild degenerative retrolisthesis of L1 Vertebrae: There is an acute fracture of L4. Superior endplate compression  fracture estimated at 40%. There is mild retropulsion involving the posterosuperior aspect of vertebral body with 30% canal compromise. Fractures likely unstable with bilateral pedicle fractures. The facets are maintained. Suspect small fracture involving the superior articulating facet on the right side. The other vertebral bodies are intact. No pars defects or lamina fractures. Paraspinal and other soft tissues: Small paraspinal hematoma at L4. Advanced vascular calcifications but no aortic aneurysm. Disc levels:  Advanced degenerative lumbar spondylosis with multilevel disc disease and facet disease. Significant multifactorial spinal and bilateral lateral recess stenosis at L3-4 and L4-5. IMPRESSION: 1. Acute L4 compression fracture estimated at 40%. There is mild retropulsion involving the posterosuperior aspect of vertebral body with 30% canal compromise. Fractures likely unstable with bilateral pedicle fractures. Suspect small fracture involving the superior articulating facet on the right side. 2. Advanced degenerative lumbar spondylosis with multilevel disc disease and facet disease. Significant multifactorial spinal and bilateral lateral recess stenosis at L3-4 and L4-5. 3. Aortic atherosclerosis. Aortic Atherosclerosis (ICD10-I70.0). Electronically Signed   By: Marijo Sanes M.D.   On: 03/23/2020 12:23   CT CHEST ABDOMEN PELVIS WO CONTRAST  Result Date: 03/23/2020 CLINICAL DATA:  Left-sided chest pain after fall. EXAM: CT CHEST, ABDOMEN AND PELVIS WITHOUT CONTRAST TECHNIQUE: Multidetector CT imaging of the chest, abdomen and pelvis was performed following the standard protocol without IV contrast. COMPARISON:  MRI lumbar spine dated Jun 30, 2018. CT chest, abdomen, and pelvis dated November 03, 2012. FINDINGS: CT CHEST FINDINGS Cardiovascular: No significant vascular findings. Normal heart size. No pericardial effusion. No thoracic aortic aneurysm. Coronary, aortic arch, and branch vessel atherosclerotic vascular disease. Mediastinum/Nodes: No enlarged mediastinal, hilar, or axillary lymph nodes. Thyroid gland, trachea, and esophagus demonstrate no significant findings. Unchanged large hiatal hernia containing the entire stomach. Lungs/Pleura: Trace bilateral pleural effusions. Chronic subsegmental atelectasis in the lingula and both lower lobes. No consolidation or pneumothorax. Musculoskeletal: Acute nondisplaced fractures of the left anterior second through fourth ribs and right anterior third through fifth  ribs. Chronic nondisplaced fracture of the left anterior fifth rib and right second through sixth ribs. Chronic fracture deformity of the right scapula. CT ABDOMEN PELVIS FINDINGS Hepatobiliary: No focal liver abnormality is seen. No gallstones, gallbladder wall thickening, or biliary dilatation. Pancreas: Unremarkable. No pancreatic ductal dilatation or surrounding inflammatory changes. Spleen: Normal in size without focal abnormality. Adrenals/Urinary Tract: The adrenal glands are unremarkable. Right renal simple cysts measuring up to 1.9 cm. Mild asymmetric atrophy of the left kidney. No renal calculi or hydronephrosis. The bladder is unremarkable. Stomach/Bowel: Large hiatal hernia again noted containing the entire stomach. No bowel wall thickening, distention, or surrounding inflammatory changes. Mild sigmoid colonic diverticulosis. Prior appendectomy. Vascular/Lymphatic: Aortic atherosclerosis. No enlarged abdominal or pelvic lymph nodes. Reproductive: The uterus and left ovary are unremarkable. Unchanged 2.4 cm simple appearing cyst in the right ovary, stable since 2014. Other: No abdominal wall hernia or abnormality. No abdominopelvic ascites. No pneumoperitoneum. Musculoskeletal: New acute L4 superior endplate compression fracture with up to 40% height loss centrally. The fracture extends into the bilateral pedicles and the right superior articular process. Minimal retropulsion of the posterosuperior endplate. Severe spinal canal stenosis and 5 mm anterolisthesis at this level are unchanged. IMPRESSION: Chest: 1. Acute nondisplaced fractures of the left anterior second through fourth ribs and right anterior third through fifth ribs. 2. Trace bilateral pleural effusions.  No pneumothorax. 3. Unchanged large hiatal hernia containing the entire stomach. Abdomen and pelvis: 1. Acute three column fracture of the L4 vertebral body and posterior  elements. Minimal retropulsion of the posterosuperior endplate.  Chronic severe spinal canal stenosis at this level. 2. Unchanged 2.4 cm simple appearing right ovarian cyst, stable since 2014. No follow-up imaging recommended. 3. Aortic Atherosclerosis (ICD10-I70.0). Electronically Signed   By: Titus Dubin M.D.   On: 03/23/2020 12:42    ____________________________________________   PROCEDURES and INTERVENTIONS  Procedure(s) performed (including Critical Care):  .1-3 Lead EKG Interpretation Performed by: Vladimir Crofts, MD Authorized by: Vladimir Crofts, MD     Interpretation: normal     ECG rate:  80   ECG rate assessment: normal     Rhythm: sinus rhythm     Ectopy: none     Conduction: normal      Medications  acetaminophen (TYLENOL) tablet 1,000 mg (1,000 mg Oral Given 03/23/20 1142)  methocarbamol (ROBAXIN) tablet 500 mg (500 mg Oral Given 03/23/20 1142)    ____________________________________________   MDM / ED COURSE   87 year old woman not on thinners and quite functional/healthy at baseline, presents after mechanical fall with evidence of multiple nondisplaced rib fractures, lumbar compression fracture and requiring medical observation admission.  Symptomatically stable on room air, not hypoxic.  Exam with tenderness to her anterior rib cage, primarily on the left, without evidence of significant overlying trauma or open injury.  No neurovascular deficits or signs of distress.  Pain is well controlled with Tylenol and Robaxin.  Started with plain films, demonstrating no cardiopulmonary pathology and possible lumbar fracture.  Due to my high clinical suspicion for rib fractures, went to CT which demonstrated 6 rib fractures in total without evidence of flail chest.  CT head without ICH or signs of CVA.  Although she is not currently hypoxic and her pain is controlled, she is at high risk for clinical decompensation from a respiratory standpoint.  Additionally because she lives at home alone.  Further concern for the possible operative nature  of her lumbar fracture.  We will order LSO bracing until neurosurgery can evaluate patient and images.  We will admit to hospitalist medicine for further work-up and management.  Clinical Course as of 03/23/20 1323  Sat Mar 23, 2020  1319 Reassessed.  Patient reports controlled pain.  Daughter is now at the bedside and provides additional history.  We discussed multiple rib fractures and lumbar compression fracture.   [DS]  2585 Discussed the case with Dr. Lacinda Axon, neurosurgery.  He will review the films and to come evaluate the patient.  We discussed the possibility of surgery versus nonoperative bracing, indicates that it would likely be up to the patient and the daughter's decision.  Plan for nonoperative bracing with LSO at this point. [DS]    Clinical Course User Index [DS] Vladimir Crofts, MD    ____________________________________________   FINAL CLINICAL IMPRESSION(S) / ED DIAGNOSES  Final diagnoses:  Closed fracture of multiple ribs of both sides, initial encounter  Closed compression fracture of L4 lumbar vertebra, initial encounter [facility])  Fall, initial encounter     ED Discharge Orders    None       Luqman Perrelli Tamala Julian   Note:  This document was prepared using Dragon voice recognition software and may include unintentional dictation errors.   Vladimir Crofts, MD 03/23/20 1326

## 2020-03-23 NOTE — ED Triage Notes (Signed)
Pt bought in via EMS s/p fall at home yesterday.  Pt states she landed  face forward on chest wall and chest pain has been consistent since.  Pt also reports difficulty getting a deep breath since incident.  Pt is AAO x 4, denies any other injury.  Denies use of blood thinners.

## 2020-03-23 NOTE — ED Notes (Signed)
Patient to CT at this time

## 2020-03-24 ENCOUNTER — Observation Stay: Payer: Medicare Other

## 2020-03-24 DIAGNOSIS — S2243XA Multiple fractures of ribs, bilateral, initial encounter for closed fracture: Secondary | ICD-10-CM | POA: Diagnosis present

## 2020-03-24 DIAGNOSIS — N1832 Chronic kidney disease, stage 3b: Secondary | ICD-10-CM | POA: Diagnosis present

## 2020-03-24 DIAGNOSIS — W19XXXA Unspecified fall, initial encounter: Secondary | ICD-10-CM | POA: Diagnosis not present

## 2020-03-24 DIAGNOSIS — Z79899 Other long term (current) drug therapy: Secondary | ICD-10-CM | POA: Diagnosis not present

## 2020-03-24 DIAGNOSIS — E039 Hypothyroidism, unspecified: Secondary | ICD-10-CM | POA: Diagnosis not present

## 2020-03-24 DIAGNOSIS — T1490XA Injury, unspecified, initial encounter: Secondary | ICD-10-CM | POA: Diagnosis present

## 2020-03-24 DIAGNOSIS — K5900 Constipation, unspecified: Secondary | ICD-10-CM | POA: Diagnosis present

## 2020-03-24 DIAGNOSIS — S32000A Wedge compression fracture of unspecified lumbar vertebra, initial encounter for closed fracture: Secondary | ICD-10-CM | POA: Diagnosis present

## 2020-03-24 DIAGNOSIS — Z79890 Hormone replacement therapy (postmenopausal): Secondary | ICD-10-CM | POA: Diagnosis not present

## 2020-03-24 DIAGNOSIS — M48061 Spinal stenosis, lumbar region without neurogenic claudication: Secondary | ICD-10-CM | POA: Diagnosis present

## 2020-03-24 DIAGNOSIS — Z888 Allergy status to other drugs, medicaments and biological substances status: Secondary | ICD-10-CM | POA: Diagnosis not present

## 2020-03-24 DIAGNOSIS — Z9841 Cataract extraction status, right eye: Secondary | ICD-10-CM | POA: Diagnosis not present

## 2020-03-24 DIAGNOSIS — I129 Hypertensive chronic kidney disease with stage 1 through stage 4 chronic kidney disease, or unspecified chronic kidney disease: Secondary | ICD-10-CM | POA: Diagnosis present

## 2020-03-24 DIAGNOSIS — W1830XA Fall on same level, unspecified, initial encounter: Secondary | ICD-10-CM | POA: Diagnosis present

## 2020-03-24 DIAGNOSIS — E7800 Pure hypercholesterolemia, unspecified: Secondary | ICD-10-CM | POA: Diagnosis present

## 2020-03-24 DIAGNOSIS — I150 Renovascular hypertension: Secondary | ICD-10-CM | POA: Diagnosis not present

## 2020-03-24 DIAGNOSIS — Z961 Presence of intraocular lens: Secondary | ICD-10-CM | POA: Diagnosis present

## 2020-03-24 DIAGNOSIS — Z20822 Contact with and (suspected) exposure to covid-19: Secondary | ICD-10-CM | POA: Diagnosis present

## 2020-03-24 DIAGNOSIS — S32040A Wedge compression fracture of fourth lumbar vertebra, initial encounter for closed fracture: Principal | ICD-10-CM

## 2020-03-24 DIAGNOSIS — Z882 Allergy status to sulfonamides status: Secondary | ICD-10-CM | POA: Diagnosis not present

## 2020-03-24 DIAGNOSIS — Z7982 Long term (current) use of aspirin: Secondary | ICD-10-CM | POA: Diagnosis not present

## 2020-03-24 DIAGNOSIS — Y92093 Driveway of other non-institutional residence as the place of occurrence of the external cause: Secondary | ICD-10-CM | POA: Diagnosis not present

## 2020-03-24 DIAGNOSIS — S32048A Other fracture of fourth lumbar vertebra, initial encounter for closed fracture: Secondary | ICD-10-CM | POA: Diagnosis not present

## 2020-03-24 DIAGNOSIS — G629 Polyneuropathy, unspecified: Secondary | ICD-10-CM | POA: Diagnosis present

## 2020-03-24 DIAGNOSIS — E782 Mixed hyperlipidemia: Secondary | ICD-10-CM | POA: Diagnosis not present

## 2020-03-24 DIAGNOSIS — Z91041 Radiographic dye allergy status: Secondary | ICD-10-CM | POA: Diagnosis not present

## 2020-03-24 DIAGNOSIS — Z85828 Personal history of other malignant neoplasm of skin: Secondary | ICD-10-CM | POA: Diagnosis not present

## 2020-03-24 DIAGNOSIS — E785 Hyperlipidemia, unspecified: Secondary | ICD-10-CM | POA: Diagnosis present

## 2020-03-24 DIAGNOSIS — Z9842 Cataract extraction status, left eye: Secondary | ICD-10-CM | POA: Diagnosis not present

## 2020-03-24 LAB — CBC
HCT: 36.8 % (ref 36.0–46.0)
Hemoglobin: 12.5 g/dL (ref 12.0–15.0)
MCH: 31.9 pg (ref 26.0–34.0)
MCHC: 34 g/dL (ref 30.0–36.0)
MCV: 93.9 fL (ref 80.0–100.0)
Platelets: 299 10*3/uL (ref 150–400)
RBC: 3.92 MIL/uL (ref 3.87–5.11)
RDW: 11.9 % (ref 11.5–15.5)
WBC: 15.4 10*3/uL — ABNORMAL HIGH (ref 4.0–10.5)
nRBC: 0 % (ref 0.0–0.2)

## 2020-03-24 LAB — SARS CORONAVIRUS 2 (TAT 6-24 HRS): SARS Coronavirus 2: NEGATIVE

## 2020-03-24 LAB — BASIC METABOLIC PANEL
Anion gap: 9 (ref 5–15)
BUN: 20 mg/dL (ref 8–23)
CO2: 24 mmol/L (ref 22–32)
Calcium: 8.7 mg/dL — ABNORMAL LOW (ref 8.9–10.3)
Chloride: 104 mmol/L (ref 98–111)
Creatinine, Ser: 1.23 mg/dL — ABNORMAL HIGH (ref 0.44–1.00)
GFR, Estimated: 42 mL/min — ABNORMAL LOW (ref >60)
Glucose, Bld: 137 mg/dL — ABNORMAL HIGH (ref 70–99)
Potassium: 4.6 mmol/L (ref 3.5–5.1)
Sodium: 137 mmol/L (ref 135–145)

## 2020-03-24 MED ORDER — ENOXAPARIN SODIUM 30 MG/0.3ML ~~LOC~~ SOLN
30.0000 mg | SUBCUTANEOUS | Status: DC
  Administered 2020-03-25 – 2020-03-29 (×5): 30 mg via SUBCUTANEOUS
  Filled 2020-03-24 (×5): qty 0.3

## 2020-03-24 MED ORDER — SODIUM CHLORIDE 0.9 % IV SOLN
12.5000 mg | Freq: Four times a day (QID) | INTRAVENOUS | Status: DC | PRN
  Administered 2020-03-25: 12.5 mg via INTRAVENOUS
  Filled 2020-03-24: qty 0.5

## 2020-03-24 MED ORDER — PROMETHAZINE HCL 25 MG PO TABS
25.0000 mg | ORAL_TABLET | Freq: Four times a day (QID) | ORAL | Status: DC | PRN
  Administered 2020-03-24: 25 mg via ORAL
  Filled 2020-03-24 (×3): qty 1

## 2020-03-24 MED ORDER — POLYETHYLENE GLYCOL 3350 17 G PO PACK
17.0000 g | PACK | Freq: Every day | ORAL | Status: DC
  Administered 2020-03-24 – 2020-03-30 (×7): 17 g via ORAL
  Filled 2020-03-24 (×7): qty 1

## 2020-03-24 NOTE — [place] Notes (Signed)
PROGRESS NOTE    Theresa Snyder  UXN:[id number] DOB: [birth year] DOA: 03/23/2020 [facility]CP: [facility]    Brief Narrative:  87 y.o. female with medical history significant for lumbar spinal stenosis,, hypothyroid, hyperlipidemia, presents to the emergency department for chief concerns of life fall and worsening back pain.  She walked to mail box and fell face first and hit her chest 03/22/20. She then got up and walked back to her car and drove up her driveway to her house. She normally uses the rollator walker to ambulate however she didn't use her walker to go to the mail box yesterday when she fell.   She went to her car and took it easy yesterday and slept on her couch last night. She took a hydrocodon 10 mg once and went to bed. She denies feeling numbness in her legs or gluteal.   She presents to the ED because of chest pain and shortness of breath with inhalation.   Assessment & Plan:   Principal Problem:   L4 vertebral fracture (HCC) Active Problems:   Chronic kidney disease   BP (high blood pressure)   Adult hypothyroidism   HLD (hyperlipidemia)   Fall    Acute L4 compression fracture-present on admission secondary to mechanical fall -With mild retrial portion involving the posterior superior aspect of the vertebral body with 30% canal compromise and read by radiologist as fractures likely unstable with bilateral pedicle fractures. -Neurosurgery was consulted and recommended at this time bracing -Brace has been placed. Have ordered upright lumbar films as per Neurosurgery recommendations -Anticipate working with PT once LSO brace is in place and there is no instability on x-ray -Possibility of surgery is approximately 50% per neurosurgery -Continue with pain control Norco 10 mg every 6 hours as needed for moderate pain, morphine 4 mg IV every 3 hours as needed for moderate pain, fentanyl 50 mcg IV every 2 hours as needed for severe pain not relieved  by morphine -Robaxin 500 mg PO q8h prn for muscle spasms  Hypertension -resumed amlodipine 10 mg daily, benazepril 20 mg p.o. daily, hydrochlorothiazide 25 mg p.o. daily, metoprolol tartrate 5 mg IV every 4 hours as needed for SBP -BP stable at present, albeit suboptimally controlled, likely secondary do discomfort from above compression fracture  Hyperlipidemia -continue with pravastatin 20 mg p.o. daily at bedtime  Hypothyroid -continue home levothyroxine 88 mcg  Neuropathy -Continue with gabapentin 300 mg p.o. daily resumed  CKD 3b -Renal function reviewed, Cr trended down to 1.23 today -Cont to follow renal panel  Constipation -Will give trial of miralax  DVT prophylaxis: Lovenox subq Code Status: Full Family Communication: Pt in room, family not at bedside  Status is: Observation  The patient will require care spanning > 2 midnights and should be moved to inpatient because: Ongoing active pain requiring inpatient pain management and Inpatient level of care appropriate due to severity of illness  Dispo: The patient is from: Home              Anticipated d/c is to: Unknown, will need PT eval               Anticipated d/c date is: 3 days              Patient currently is not medically stable to d/c.   Difficult to place patient No       Consultants:   Neurosurgery  Procedures:     Antimicrobials: Anti-infectives (From admission, onward)  None       Subjective: Complains of low back discomfort  Objective: Vitals:   03/24/20 0251 03/24/20 0747 03/24/20 1116 03/24/20 1553  BP: (!) 156/80 (!) 151/73 (!) 168/79 (!) 165/94  Pulse: 91 90 89 74  Resp: 18 18 17 15   Temp:  98.4 F (36.9 C) 98.9 F (37.2 C) 98.2 F (36.8 C)  TempSrc:    Oral  SpO2: 94% 91% 92% 94%  Weight:      Height:        Intake/Output Summary (Last 24 hours) at 03/24/2020 1750 Last data filed at 03/24/2020 1400 Gross per 24 hour  Intake 240 ml  Output 300 ml  Net -60 ml    Filed Weights   03/23/20 0952 03/23/20 1112  Weight: 68 kg 68 kg    Examination:  General exam: Appears calm and comfortable  Respiratory system: Clear to auscultation. Respiratory effort normal. Cardiovascular system: S1 & S2 heard, Regular Gastrointestinal system: Abdomen is nondistended, soft and nontender. No organomegaly or masses felt. Normal bowel sounds heard. Central nervous system: Alert and oriented. No focal neurological deficits. Extremities: Symmetric 5 x 5 power. Skin: No rashes, lesions  Psychiatry: Judgement and insight appear normal. Mood & affect appropriate.   Data Reviewed: I have personally reviewed following labs and imaging studies  CBC: Recent Labs  Lab 03/23/20 1005 03/24/20 0456  WBC 10.2 15.4*  NEUTROABS 7.5  --   HGB 13.1 12.5  HCT 37.9 36.8  MCV 94.5 93.9  PLT 286 812   Basic Metabolic Panel: Recent Labs  Lab 03/23/20 1005 03/24/20 0456  NA 138 137  K 4.2 4.6  CL 102 104  CO2 23 24  GLUCOSE 116* 137*  BUN 27* 20  CREATININE 1.53* 1.23*  CALCIUM 9.3 8.7*   GFR: Estimated Creatinine Clearance: 27.2 mL/min (A) (by C-G formula based on SCr of 1.23 mg/dL (H)). Liver Function Tests: No results for input(s): AST, ALT, ALKPHOS, BILITOT, PROT, ALBUMIN in the last 168 hours. No results for input(s): LIPASE, AMYLASE in the last 168 hours. No results for input(s): AMMONIA in the last 168 hours. Coagulation Profile: No results for input(s): INR, PROTIME in the last 168 hours. Cardiac Enzymes: No results for input(s): CKTOTAL, CKMB, CKMBINDEX, TROPONINI in the last 168 hours. BNP (last 3 results) No results for input(s): PROBNP in the last 8760 hours. HbA1C: No results for input(s): HGBA1C in the last 72 hours. CBG: No results for input(s): GLUCAP in the last 168 hours. Lipid Profile: No results for input(s): CHOL, HDL, LDLCALC, TRIG, CHOLHDL, LDLDIRECT in the last 72 hours. Thyroid Function Tests: No results for input(s): TSH,  T4TOTAL, FREET4, T3FREE, THYROIDAB in the last 72 hours. Anemia Panel: No results for input(s): VITAMINB12, FOLATE, FERRITIN, TIBC, IRON, RETICCTPCT in the last 72 hours. Sepsis Labs: No results for input(s): PROCALCITON, LATICACIDVEN in the last 168 hours.  Recent Results (from the past 240 hour(s))  SARS CORONAVIRUS 2 (TAT 6-24 HRS) Nasopharyngeal Nasopharyngeal Swab     Status: None   Collection Time: 03/23/20  2:01 PM   Specimen: Nasopharyngeal Swab  Result Value Ref Range Status   SARS Coronavirus 2 NEGATIVE NEGATIVE Final    Comment: (NOTE) SARS-CoV-2 target nucleic acids are NOT DETECTED.  The SARS-CoV-2 RNA is generally detectable in upper and lower respiratory specimens during the acute phase of infection. Negative results do not preclude SARS-CoV-2 infection, do not rule out co-infections with other pathogens, and should not be used as the sole basis for  treatment or other patient management decisions. Negative results must be combined with clinical observations, patient history, and epidemiological information. The expected result is Negative.  Fact Sheet for Patients: SugarRoll.be  Fact Sheet for Healthcare Providers: https://www.woods-mathews.com/  This test is not yet approved or cleared by the [place] FDA and  has been authorized for detection and/or diagnosis of SARS-CoV-2 by FDA under an Emergency Use Authorization (EUA). This EUA will remain  in effect (meaning this test can be used) for the duration of the COVID-19 declaration under Se ction 564(b)(1) of the Act, 21 U.S.C. section 360bbb-3(b)(1), unless the authorization is terminated or revoked sooner.  Performed at [facility], [place] [address], [place], [place] [address]      Radiology Studies: DG Chest 2 View  Result Date: 03/23/2020 CLINICAL DATA:  Pain following fall EXAM: CHEST - 2 VIEW COMPARISON:  October 09, 2017 FINDINGS: There is apparent  scarring in the left base. No edema or airspace opacity. Heart is upper normal in size with pulmonary vascularity normal. There is an apparent hiatal hernia. No adenopathy. There is aortic atherosclerosis. No pneumothorax. Bones are osteoporotic. There is degenerative change in the thoracic spine. No appreciable acute fracture. IMPRESSION: Scarring left base. No edema or airspace opacity. Heart upper normal in size. Hiatal hernia present. Bones osteoporotic without fracture demonstrable. Aortic Atherosclerosis (ICD10-I70.0). Electronically Signed   By: Lowella Grip III M.D.   On: 03/23/2020 10:49   DG Lumbar Spine 2-3 Views  Result Date: 03/24/2020 CLINICAL DATA:  L4 compression fracture, upright projection while wearing brace. EXAM: LUMBAR SPINE - 2-3 VIEW COMPARISON:  Multiple exams, including radiographs from 03/23/2020 and CT scan from 03/23/2020 FINDINGS: L4 compression fracture with 65% of loss of vertebral body height centrally and mild posterior retropulsion better shown on the prior CT scan. The extension of the fracture into the pedicles is also better shown on the prior CT. 5 mm of grade 1 anterolisthesis of L4 on L5, but I do note that this anterolisthesis was present on the 06/30/2018 MRI and accordingly is probably not directly attributable to the pedicle fractures. Bony demineralization. Grade 1 degenerative retrolisthesis at T12-L1 and L1-2 as shown on recent CT scan. Degenerative endplate findings at B14-N8 and L1-2. Multilevel degenerative facet arthropathy is specially in the lower lumbar spine. IMPRESSION: 1. L4 compression fracture with 65% loss of vertebral body height centrally and mild posterior retropulsion better shown on the prior CT scan. 2. There is 5 mm of anterolisthesis of L4 on L5, but this anterolisthesis was also present on 06/30/2018 MRI and accordingly is probably degenerative rather than necessarily being due to the known pedicle fractures. Electronically Signed   By:  Van Clines M.D.   On: 03/24/2020 12:29   DG Lumbar Spine Complete  Result Date: 03/23/2020 CLINICAL DATA:  Pain following fall EXAM: LUMBAR SPINE - COMPLETE 4+ VIEW COMPARISON:  July 23, 2016. FINDINGS: Frontal, lateral, spot lumbosacral lateral, and bilateral oblique views were obtained. There are 5 non-rib-bearing lumbar type vertebral bodies. Bones are osteoporotic. There is a compression type fracture along the superior aspect of the L4 vertebral body with superior endplate collapse. This fracture extends into the posterior aspect of the vertebral body with a suspected focus of bony retropulsion into the anterior thecal sac. This fracture was not present previously and appears potentially acute. No other evident fracture. There is 3 mm of anterolisthesis of L4 on L5, a finding not present previously. Elsewhere there is disc space narrowing at  L1-2 as well as in the lower thoracic region. There is stable 2 mm of retrolisthesis of L1 on L2. There is facet osteoarthritic change at all levels bilaterally. There is aortic atherosclerosis. IMPRESSION: 1. There is a suspected acute fracture along the superior mid to posterior aspect of the L4 vertebral body with concern for retropulsion of bone into the anterior canal. There is new 3 mm of anterolisthesis of L4 on L5. This finding may warrant CT or MR to further evaluate, particularly with potential retropulsion of bone into the canal at this site. 2.  Diffuse osteoporosis. 3.  Multifocal osteoarthritic change, stable. 4.  Aortic Atherosclerosis (ICD10-I70.0). Electronically Signed   By: Lowella Grip III M.D.   On: 03/23/2020 10:52   CT Head Wo Contrast  Result Date: 03/23/2020 CLINICAL DATA:  Fall at home yesterday with chest injury. EXAM: CT HEAD WITHOUT CONTRAST TECHNIQUE: Contiguous axial images were obtained from the base of the skull through the vertex without intravenous contrast. COMPARISON:  10/09/2017 head CT. FINDINGS: Brain: No evidence  of parenchymal hemorrhage or extra-axial fluid collection. No mass lesion, mass effect, or midline shift. No CT evidence of acute infarction. Nonspecific mild subcortical and periventricular white matter hypodensity, most in keeping with chronic small vessel ischemic change. Cerebral volume is age appropriate. No ventriculomegaly. Vascular: No acute abnormality. Skull: No evidence of calvarial fracture. Sinuses/Orbits: The visualized paranasal sinuses are essentially clear. Other:  The mastoid air cells are unopacified. IMPRESSION: 1. No evidence of acute intracranial abnormality. No evidence of calvarial fracture. 2. Mild chronic small vessel ischemic changes in the cerebral white matter. Electronically Signed   By: Ilona Sorrel M.D.   On: 03/23/2020 12:26   CT L-SPINE NO CHARGE  Result Date: 03/23/2020 CLINICAL DATA:  [place] at home. EXAM: CT LUMBAR SPINE WITHOUT CONTRAST TECHNIQUE: Multidetector CT imaging of the lumbar spine was performed without intravenous contrast administration. Multiplanar CT image reconstructions were also generated. COMPARISON:  Lumbar radiographs, same date. Prior MRI lumbar spine 06/30/2018 FINDINGS: Segmentation: There are five lumbar type vertebral bodies. The last full intervertebral disc space is labeled L5-S1. This correlates with the lumbar radiographs and prior MRI. Alignment: Degenerative anterolisthesis of L4 is a stable finding. Is also mild degenerative retrolisthesis of L1 Vertebrae: There is an acute fracture of L4. Superior endplate compression fracture estimated at 40%. There is mild retropulsion involving the posterosuperior aspect of vertebral body with 30% canal compromise. Fractures likely unstable with bilateral pedicle fractures. The facets are maintained. Suspect small fracture involving the superior articulating facet on the right side. The other vertebral bodies are intact. No pars defects or lamina fractures. Paraspinal and other soft tissues: Small paraspinal  hematoma at L4. Advanced vascular calcifications but no aortic aneurysm. Disc levels: Advanced degenerative lumbar spondylosis with multilevel disc disease and facet disease. Significant multifactorial spinal and bilateral lateral recess stenosis at L3-4 and L4-5. IMPRESSION: 1. Acute L4 compression fracture estimated at 40%. There is mild retropulsion involving the posterosuperior aspect of vertebral body with 30% canal compromise. Fractures likely unstable with bilateral pedicle fractures. Suspect small fracture involving the superior articulating facet on the right side. 2. Advanced degenerative lumbar spondylosis with multilevel disc disease and facet disease. Significant multifactorial spinal and bilateral lateral recess stenosis at L3-4 and L4-5. 3. Aortic atherosclerosis. Aortic Atherosclerosis (ICD10-I70.0). Electronically Signed   By: Marijo Sanes M.D.   On: 03/23/2020 12:23   CT CHEST ABDOMEN PELVIS WO CONTRAST  Result Date: 03/23/2020 CLINICAL DATA:  Left-sided chest pain after  fall. EXAM: CT CHEST, ABDOMEN AND PELVIS WITHOUT CONTRAST TECHNIQUE: Multidetector CT imaging of the chest, abdomen and pelvis was performed following the standard protocol without IV contrast. COMPARISON:  MRI lumbar spine dated Jun 30, 2018. CT chest, abdomen, and pelvis dated November 03, 2012. FINDINGS: CT CHEST FINDINGS Cardiovascular: No significant vascular findings. Normal heart size. No pericardial effusion. No thoracic aortic aneurysm. Coronary, aortic arch, and branch vessel atherosclerotic vascular disease. Mediastinum/Nodes: No enlarged mediastinal, hilar, or axillary lymph nodes. Thyroid gland, trachea, and esophagus demonstrate no significant findings. Unchanged large hiatal hernia containing the entire stomach. Lungs/Pleura: Trace bilateral pleural effusions. Chronic subsegmental atelectasis in the lingula and both lower lobes. No consolidation or pneumothorax. Musculoskeletal: Acute nondisplaced fractures of  the left anterior second through fourth ribs and right anterior third through fifth ribs. Chronic nondisplaced fracture of the left anterior fifth rib and right second through sixth ribs. Chronic fracture deformity of the right scapula. CT ABDOMEN PELVIS FINDINGS Hepatobiliary: No focal liver abnormality is seen. No gallstones, gallbladder wall thickening, or biliary dilatation. Pancreas: Unremarkable. No pancreatic ductal dilatation or surrounding inflammatory changes. Spleen: Normal in size without focal abnormality. Adrenals/Urinary Tract: The adrenal glands are unremarkable. Right renal simple cysts measuring up to 1.9 cm. Mild asymmetric atrophy of the left kidney. No renal calculi or hydronephrosis. The bladder is unremarkable. Stomach/Bowel: Large hiatal hernia again noted containing the entire stomach. No bowel wall thickening, distention, or surrounding inflammatory changes. Mild sigmoid colonic diverticulosis. Prior appendectomy. Vascular/Lymphatic: Aortic atherosclerosis. No enlarged abdominal or pelvic lymph nodes. Reproductive: The uterus and left ovary are unremarkable. Unchanged 2.4 cm simple appearing cyst in the right ovary, stable since 2014. Other: No abdominal wall hernia or abnormality. No abdominopelvic ascites. No pneumoperitoneum. Musculoskeletal: New acute L4 superior endplate compression fracture with up to 40% height loss centrally. The fracture extends into the bilateral pedicles and the right superior articular process. Minimal retropulsion of the posterosuperior endplate. Severe spinal canal stenosis and 5 mm anterolisthesis at this level are unchanged. IMPRESSION: Chest: 1. Acute nondisplaced fractures of the left anterior second through fourth ribs and right anterior third through fifth ribs. 2. Trace bilateral pleural effusions.  No pneumothorax. 3. Unchanged large hiatal hernia containing the entire stomach. Abdomen and pelvis: 1. Acute three column fracture of the L4 vertebral body  and posterior elements. Minimal retropulsion of the posterosuperior endplate. Chronic severe spinal canal stenosis at this level. 2. Unchanged 2.4 cm simple appearing right ovarian cyst, stable since 2014. No follow-up imaging recommended. 3. Aortic Atherosclerosis (ICD10-I70.0). Electronically Signed   By: Titus Dubin M.D.   On: 03/23/2020 12:42    Scheduled Meds: . amLODipine  10 mg Oral Q breakfast  . benazepril  20 mg Oral Daily  . enoxaparin (LOVENOX) injection  30 mg Subcutaneous Q24H  . gabapentin  300 mg Oral Daily  . hydrochlorothiazide  25 mg Oral Daily  . levothyroxine  88 mcg Oral QAC breakfast  . polyethylene glycol  17 g Oral Daily  . pravastatin  20 mg Oral QHS   Continuous Infusions:   LOS: 0 days   Marylu Lund, MD [facility] Pager On Amion  If 7PM-7AM, please contact night-coverage 03/24/2020, 5:50 PM

## 2020-03-24 NOTE — [place] Notes (Signed)
PHARMACIST - PHYSICIAN COMMUNICATION  CONCERNING:  Enoxaparin (Lovenox) for DVT Prophylaxis    RECOMMENDATION: Patient was prescribed enoxaprin 40mg  q24 hours for VTE prophylaxis.   Filed Weights   03/23/20 0952 03/23/20 1112  Weight: 68 kg (150 lb) 68 kg (150 lb)    Body mass index is 29.29 kg/m.  Estimated Creatinine Clearance: 27.2 mL/min (A) (by C-G formula based on SCr of 1.23 mg/dL (H)).  Patient is candidate for enoxaparin 30mg  every 24 hours based on CrCl <23ml/min  DESCRIPTION: Pharmacy has adjusted enoxaparin dose per [facility] policy.  Patient is now receiving enoxaparin 30 mg every 24 hours    Dallie Piles, PharmD Clinical Pharmacist  03/24/2020 8:36 AM

## 2020-03-25 ENCOUNTER — Inpatient Hospital Stay: Payer: Medicare Other

## 2020-03-25 DIAGNOSIS — S32040A Wedge compression fracture of fourth lumbar vertebra, initial encounter for closed fracture: Secondary | ICD-10-CM | POA: Diagnosis not present

## 2020-03-25 DIAGNOSIS — E039 Hypothyroidism, unspecified: Secondary | ICD-10-CM | POA: Diagnosis not present

## 2020-03-25 LAB — COMPREHENSIVE METABOLIC PANEL
ALT: 11 U/L (ref 0–44)
AST: 18 U/L (ref 15–41)
Albumin: 3.2 g/dL — ABNORMAL LOW (ref 3.5–5.0)
Alkaline Phosphatase: 74 U/L (ref 38–126)
Anion gap: 9 (ref 5–15)
BUN: 17 mg/dL (ref 8–23)
CO2: 23 mmol/L (ref 22–32)
Calcium: 8.5 mg/dL — ABNORMAL LOW (ref 8.9–10.3)
Chloride: 104 mmol/L (ref 98–111)
Creatinine, Ser: 1.32 mg/dL — ABNORMAL HIGH (ref 0.44–1.00)
GFR, Estimated: 39 mL/min — ABNORMAL LOW (ref >60)
Glucose, Bld: 115 mg/dL — ABNORMAL HIGH (ref 70–99)
Potassium: 4.2 mmol/L (ref 3.5–5.1)
Sodium: 136 mmol/L (ref 135–145)
Total Bilirubin: 0.3 mg/dL (ref 0.3–1.2)
Total Protein: 6.2 g/dL — ABNORMAL LOW (ref 6.5–8.1)

## 2020-03-25 MED ORDER — SIMETHICONE 40 MG/0.6ML PO SUSP
80.0000 mg | Freq: Four times a day (QID) | ORAL | Status: DC | PRN
  Administered 2020-03-25 – 2020-03-28 (×2): 80 mg via ORAL
  Filled 2020-03-25 (×8): qty 1.2

## 2020-03-25 MED ORDER — OXYCODONE-ACETAMINOPHEN 5-325 MG PO TABS
1.0000 | ORAL_TABLET | ORAL | Status: DC | PRN
  Administered 2020-03-26 – 2020-03-29 (×14): 1 via ORAL
  Filled 2020-03-25 (×15): qty 1

## 2020-03-25 MED ORDER — DOCUSATE SODIUM 100 MG PO CAPS
100.0000 mg | ORAL_CAPSULE | Freq: Two times a day (BID) | ORAL | Status: DC
  Administered 2020-03-25 – 2020-03-30 (×11): 100 mg via ORAL
  Filled 2020-03-25 (×11): qty 1

## 2020-03-25 MED ORDER — BISACODYL 10 MG RE SUPP
10.0000 mg | Freq: Once | RECTAL | Status: AC
  Administered 2020-03-25: 10 mg via RECTAL
  Filled 2020-03-25: qty 1

## 2020-03-25 NOTE — Plan of Care (Signed)
  Problem: Education: Goal: Knowledge of General Education information will improve Description: Including pain rating scale, medication(s)/side effects and non-pharmacologic comfort measures 03/25/2020 1852 by Cristela Blue, RN Outcome: [place] 03/25/2020 1852 by Cristela Blue, RN Outcome: [place]   Problem: Health Behavior/Discharge Planning: Goal: Ability to manage health-related needs will improve 03/25/2020 1852 by Cristela Blue, RN Outcome: [place] 03/25/2020 1852 by Cristela Blue, RN Outcome: [place]   Problem: Clinical Measurements: Goal: Ability to maintain clinical measurements within normal limits will improve 03/25/2020 1852 by Cristela Blue, RN Outcome: [place] 03/25/2020 1852 by Cristela Blue, RN Outcome: [place] Goal: Will remain free from infection 03/25/2020 1852 by Cristela Blue, RN Outcome: [place] 03/25/2020 1852 by Cristela Blue, RN Outcome: [place] Goal: Diagnostic test results will improve 03/25/2020 1852 by Cristela Blue, RN Outcome: [place] 03/25/2020 1852 by Cristela Blue, RN Outcome: [place] Goal: Respiratory complications will improve 03/25/2020 1852 by Cristela Blue, RN Outcome: [place] 03/25/2020 1852 by Cristela Blue, RN Outcome: [place] Goal: Cardiovascular complication will be avoided 03/25/2020 1852 by Cristela Blue, RN Outcome: [place] 03/25/2020 1852 by Cristela Blue, RN Outcome: [place]   Problem: Activity: Goal: Risk for activity intolerance will decrease 03/25/2020 1852 by Cristela Blue, RN Outcome: [place] 03/25/2020 1852 by Cristela Blue, RN Outcome: [place]   Problem: Nutrition: Goal: Adequate nutrition will be maintained 03/25/2020 1852 by Cristela Blue, RN Outcome: [place] 03/25/2020 1852 by Cristela Blue, RN Outcome: [place]   Problem: Coping: Goal: Level of anxiety will decrease 03/25/2020 1852 by Cristela Blue, RN Outcome:  [place] 03/25/2020 1852 by Cristela Blue, RN Outcome: [place]   Problem: Elimination: Goal: Will not experience complications related to bowel motility 03/25/2020 1852 by Cristela Blue, RN Outcome: [place] 03/25/2020 1852 by Cristela Blue, RN Outcome: [place] Goal: Will not experience complications related to urinary retention 03/25/2020 1852 by Cristela Blue, RN Outcome: [place] 03/25/2020 1852 by Cristela Blue, RN Outcome: [place]   Problem: Pain Managment: Goal: General experience of comfort will improve 03/25/2020 1852 by Cristela Blue, RN Outcome: [place] 03/25/2020 1852 by Cristela Blue, RN Outcome: [place]   Problem: Safety: Goal: Ability to remain free from injury will improve 03/25/2020 1852 by Cristela Blue, RN Outcome: [place] 03/25/2020 1852 by Cristela Blue, RN Outcome: [place]   Problem: Skin Integrity: Goal: Risk for impaired skin integrity will decrease 03/25/2020 1852 by Cristela Blue, RN Outcome: [place] 03/25/2020 1852 by Cristela Blue, RN Outcome: [place]

## 2020-03-25 NOTE — Progress Notes (Signed)
PROGRESS NOTE    Theresa Snyder  LKG:[id number] DOB: [birth year] DOA: 03/23/2020 PCP: [facility]    Brief Narrative:  87 y.o. female with medical history significant for lumbar spinal stenosis,, hypothyroid, hyperlipidemia, presents to the emergency department for chief concerns of life fall and worsening back pain.  She walked to mail box and fell face first and hit her chest 03/22/20. She then got up and walked back to her car and drove up her driveway to her house. She normally uses the rollator walker to ambulate however she didn't use her walker to go to the mail box yesterday when she fell.   She went to her car and took it easy yesterday and slept on her couch last night. She took a hydrocodon 10 mg once and went to bed. She denies feeling numbness in her legs or gluteal.   She presents to the ED because of chest pain and shortness of breath with inhalation.   Assessment & Plan:   Principal Problem:   L4 vertebral fracture (HCC) Active Problems:   Chronic kidney disease   BP (high blood pressure)   Adult hypothyroidism   HLD (hyperlipidemia)   Fall   Lumbar compression fracture (HCC)    Acute L4 compression fracture-present on admission secondary to mechanical fall -With mild retrial portion involving the posterior superior aspect of the vertebral body with 30% canal compromise and read by radiologist as fractures likely unstable with bilateral pedicle fractures. -Neurosurgery was consulted and recommended at this time bracing -Brace has been placed. Have ordered upright lumbar films as per Neurosurgery recommendations -Anticipate working with PT once LSO brace is in place and there is no instability on x-ray -Possibility of surgery is approximately 50% per neurosurgery -Continue with pain control Norco 10 mg every 6 hours as needed for moderate pain, morphine 4 mg IV every 3 hours as needed for moderate pain, fentanyl 50 mcg IV every 2 hours as  needed for severe pain not relieved by morphine -Robaxin 500 mg PO q8h prn for muscle spasms -Upright xray reviewed. Findings of L4 compression fracture with 65% loss of vertebral body height. Awaiting input by Neurology  Hypertension -resumed amlodipine 10 mg daily, benazepril 20 mg p.o. daily, hydrochlorothiazide 25 mg p.o. daily, metoprolol tartrate 5 mg IV every 4 hours as needed for SBP -BP currently remains stable  Hyperlipidemia -continue with pravastatin 20 mg p.o. daily at bedtime  Hypothyroid -continue home levothyroxine 88 mcg -Most recent TSH from 8/19 noted to be 1.576  Neuropathy -Continue with gabapentin 300 mg p.o. daily resumed  CKD 3b -Renal function reviewed, Cr trended down to 1.23 today -follow bmet trends  Constipation -No result with miralax -Dulcolax suppository ordered. Continue with cathartics until good result  DVT prophylaxis: Lovenox subq Code Status: Full Family Communication: Pt in room, family not at bedside  Status is: Inpt  Requires continued inpt status because: Ongoing active pain requiring inpatient pain management and Inpatient level of care appropriate due to severity of illness  Dispo: The patient is from: Home              Anticipated d/c is to: Unknown, will need PT eval               Anticipated d/c date is: 3 days              Patient currently is not medically stable to d/c.   Difficult to place patient No  Consultants:   Neurosurgery  Procedures:     Antimicrobials: Anti-infectives (From admission, onward)   None      Subjective: Complains of constiipation  Objective: Vitals:   03/25/20 0329 03/25/20 0804 03/25/20 1202 03/25/20 1538  BP: 119/61 (!) 145/76 (!) 155/90 130/72  Pulse: 96 72 94 86  Resp: 20 18 18 18   Temp: 98.5 F (36.9 C) 98.1 F (36.7 C) 98.4 F (36.9 C) 98.4 F (36.9 C)  TempSrc: Oral     SpO2: 92% 90% 93% 91%  Weight:      Height:        Intake/Output Summary (Last 24  hours) at 03/25/2020 1752 Last data filed at 03/25/2020 0555 Gross per 24 hour  Intake 265 ml  Output 850 ml  Net -585 ml   Filed Weights   03/23/20 0952 03/23/20 1112  Weight: 68 kg 68 kg    Examination: General exam: Awake, laying in bed, in nad Respiratory system: Normal respiratory effort, no wheezing Cardiovascular system: regular rate, s1, s2 Gastrointestinal system: Soft, nondistended, positive BS Central nervous system: CN2-12 grossly intact, strength intact Extremities: Perfused, no clubbing Skin: Normal skin turgor, no notable skin lesions seen Psychiatry: Mood normal // no visual hallucinations   Data Reviewed: I have personally reviewed following labs and imaging studies  CBC: Recent Labs  Lab 03/23/20 1005 03/24/20 0456  WBC 10.2 15.4*  NEUTROABS 7.5  --   HGB 13.1 12.5  HCT 37.9 36.8  MCV 94.5 93.9  PLT 286 277   Basic Metabolic Panel: Recent Labs  Lab 03/23/20 1005 03/24/20 0456 03/25/20 0612  NA 138 137 136  K 4.2 4.6 4.2  CL 102 104 104  CO2 23 24 23   GLUCOSE 116* 137* 115*  BUN 27* 20 17  CREATININE 1.53* 1.23* 1.32*  CALCIUM 9.3 8.7* 8.5*   GFR: Estimated Creatinine Clearance: 25.3 mL/min (A) (by C-G formula based on SCr of 1.32 mg/dL (H)). Liver Function Tests: Recent Labs  Lab 03/25/20 0612  AST 18  ALT 11  ALKPHOS 74  BILITOT 0.3  PROT 6.2*  ALBUMIN 3.2*   No results for input(s): LIPASE, AMYLASE in the last 168 hours. No results for input(s): AMMONIA in the last 168 hours. Coagulation Profile: No results for input(s): INR, PROTIME in the last 168 hours. Cardiac Enzymes: No results for input(s): CKTOTAL, CKMB, CKMBINDEX, TROPONINI in the last 168 hours. BNP (last 3 results) No results for input(s): PROBNP in the last 8760 hours. HbA1C: No results for input(s): HGBA1C in the last 72 hours. CBG: No results for input(s): GLUCAP in the last 168 hours. Lipid Profile: No results for input(s): CHOL, HDL, LDLCALC, TRIG, CHOLHDL,  LDLDIRECT in the last 72 hours. Thyroid Function Tests: No results for input(s): TSH, T4TOTAL, FREET4, T3FREE, THYROIDAB in the last 72 hours. Anemia Panel: No results for input(s): VITAMINB12, FOLATE, FERRITIN, TIBC, IRON, RETICCTPCT in the last 72 hours. Sepsis Labs: No results for input(s): PROCALCITON, LATICACIDVEN in the last 168 hours.  Recent Results (from the past 240 hour(s))  SARS CORONAVIRUS 2 (TAT 6-24 HRS) Nasopharyngeal Nasopharyngeal Swab     Status: None   Collection Time: 03/23/20  2:01 PM   Specimen: Nasopharyngeal Swab  Result Value Ref Range Status   SARS Coronavirus 2 NEGATIVE NEGATIVE Final    Comment: (NOTE) SARS-CoV-2 target nucleic acids are NOT DETECTED.  The SARS-CoV-2 RNA is generally detectable in upper and lower respiratory specimens during the acute phase of infection. Negative results do not preclude  SARS-CoV-2 infection, do not rule out co-infections with other pathogens, and should not be used as the sole basis for treatment or other patient management decisions. Negative results must be combined with clinical observations, patient history, and epidemiological information. The expected result is Negative.  Fact Sheet for Patients: SugarRoll.be  Fact Sheet for Healthcare Providers: https://www.woods-mathews.com/  This test is not yet approved or cleared by the [place] FDA and  has been authorized for detection and/or diagnosis of SARS-CoV-2 by FDA under an Emergency Use Authorization (EUA). This EUA will remain  in effect (meaning this test can be used) for the duration of the COVID-19 declaration under Se ction 564(b)(1) of the Act, 21 U.S.C. section 360bbb-3(b)(1), unless the authorization is terminated or revoked sooner.  Performed at [facility], [place] [address] [place], [place] [address]      Radiology Studies: DG Lumbar Spine 2-3 Views  Result Date: 03/24/2020 CLINICAL DATA:  L4  compression fracture, upright projection while wearing brace. EXAM: LUMBAR SPINE - 2-3 VIEW COMPARISON:  Multiple exams, including radiographs from 03/23/2020 and CT scan from 03/23/2020 FINDINGS: L4 compression fracture with 65% of loss of vertebral body height centrally and mild posterior retropulsion better shown on the prior CT scan. The extension of the fracture into the pedicles is also better shown on the prior CT. 5 mm of grade 1 anterolisthesis of L4 on L5, but I do note that this anterolisthesis was present on the 06/30/2018 MRI and accordingly is probably not directly attributable to the pedicle fractures. Bony demineralization. Grade 1 degenerative retrolisthesis at T12-L1 and L1-2 as shown on recent CT scan. Degenerative endplate findings at J85-U3 and L1-2. Multilevel degenerative facet arthropathy is specially in the lower lumbar spine. IMPRESSION: 1. L4 compression fracture with 65% loss of vertebral body height centrally and mild posterior retropulsion better shown on the prior CT scan. 2. There is 5 mm of anterolisthesis of L4 on L5, but this anterolisthesis was also present on 06/30/2018 MRI and accordingly is probably degenerative rather than necessarily being due to the known pedicle fractures. Electronically Signed   By: Van Clines M.D.   On: 03/24/2020 12:29   DG Abd 1 View  Result Date: 03/25/2020 CLINICAL DATA:  Abdominal pain. EXAM: ABDOMEN - 1 VIEW COMPARISON:  CT abdomen pelvis 03/23/2020 FINDINGS: Large hiatal hernia, partially visualized. Similar-appearing gaseous distension of likely the large bowel. No definite obstructive bowel gas pattern. No pneumatosis identified. In findings to suggest free intraperitoneal gas. No radio-opaque calculi or other significant radiographic abnormality are seen. Multilevel degenerative changes of the spine. IMPRESSION: 1. No obstructive bowel gas pattern. 2. Large hiatal hernia partially visualized. Electronically Signed   By: Iven Finn M.D.   On: 03/25/2020 00:32    Scheduled Meds: . amLODipine  10 mg Oral Q breakfast  . benazepril  20 mg Oral Daily  . docusate sodium  100 mg Oral BID  . enoxaparin (LOVENOX) injection  30 mg Subcutaneous Q24H  . gabapentin  300 mg Oral Daily  . hydrochlorothiazide  25 mg Oral Daily  . levothyroxine  88 mcg Oral QAC breakfast  . polyethylene glycol  17 g Oral Daily  . pravastatin  20 mg Oral QHS   Continuous Infusions: . chlorproMAZINE (THORAZINE) IV Stopped (03/25/20 0132)     LOS: 1 day   Marylu Lund, MD [facility] Pager On Amion  If 7PM-7AM, please contact night-coverage 03/25/2020, 5:52 PM

## 2020-03-26 DIAGNOSIS — E039 Hypothyroidism, unspecified: Secondary | ICD-10-CM | POA: Diagnosis not present

## 2020-03-26 DIAGNOSIS — S2243XA Multiple fractures of ribs, bilateral, initial encounter for closed fracture: Secondary | ICD-10-CM

## 2020-03-26 DIAGNOSIS — S32040A Wedge compression fracture of fourth lumbar vertebra, initial encounter for closed fracture: Secondary | ICD-10-CM | POA: Diagnosis not present

## 2020-03-26 LAB — CBC
HCT: 35.5 % — ABNORMAL LOW (ref 36.0–46.0)
Hemoglobin: 12.2 g/dL (ref 12.0–15.0)
MCH: 32.7 pg (ref 26.0–34.0)
MCHC: 34.4 g/dL (ref 30.0–36.0)
MCV: 95.2 fL (ref 80.0–100.0)
Platelets: 292 10*3/uL (ref 150–400)
RBC: 3.73 MIL/uL — ABNORMAL LOW (ref 3.87–5.11)
RDW: 12 % (ref 11.5–15.5)
WBC: 13.7 10*3/uL — ABNORMAL HIGH (ref 4.0–10.5)
nRBC: 0 % (ref 0.0–0.2)

## 2020-03-26 LAB — COMPREHENSIVE METABOLIC PANEL
ALT: 10 U/L (ref 0–44)
AST: 18 U/L (ref 15–41)
Albumin: 3.1 g/dL — ABNORMAL LOW (ref 3.5–5.0)
Alkaline Phosphatase: 78 U/L (ref 38–126)
Anion gap: 7 (ref 5–15)
BUN: 20 mg/dL (ref 8–23)
CO2: 24 mmol/L (ref 22–32)
Calcium: 8.8 mg/dL — ABNORMAL LOW (ref 8.9–10.3)
Chloride: 104 mmol/L (ref 98–111)
Creatinine, Ser: 1.39 mg/dL — ABNORMAL HIGH (ref 0.44–1.00)
GFR, Estimated: 36 mL/min — ABNORMAL LOW (ref >60)
Glucose, Bld: 114 mg/dL — ABNORMAL HIGH (ref 70–99)
Potassium: 4.2 mmol/L (ref 3.5–5.1)
Sodium: 135 mmol/L (ref 135–145)
Total Bilirubin: 0.6 mg/dL (ref 0.3–1.2)
Total Protein: 6.2 g/dL — ABNORMAL LOW (ref 6.5–8.1)

## 2020-03-26 NOTE — Plan of Care (Signed)

## 2020-03-26 NOTE — Progress Notes (Signed)
PROGRESS NOTE    Theresa Snyder  YIR:[id number] DOB: 09/12/1929 DOA: 03/23/2020 PCP: [facility]    Brief Narrative:  87 y.o. female with medical history significant for lumbar spinal stenosis,, hypothyroid, hyperlipidemia, presents to the emergency department for chief concerns of life fall and worsening back pain.  She walked to mail box and fell face first and hit her chest 03/22/20. She then got up and walked back to her car and drove up her driveway to her house. She normally uses the rollator walker to ambulate however she didn't use her walker to go to the mail box yesterday when she fell.   She went to her car and took it easy yesterday and slept on her couch last night. She took a hydrocodon 10 mg once and went to bed. She denies feeling numbness in her legs or gluteal.   She presents to the ED because of chest pain and shortness of breath with inhalation.   Assessment & Plan:   Principal Problem:   L4 vertebral fracture (HCC) Active Problems:   Chronic kidney disease   BP (high blood pressure)   Adult hypothyroidism   HLD (hyperlipidemia)   Fall   Lumbar compression fracture (HCC)    Acute L4 compression fracture-present on admission secondary to mechanical fall -With mild retrial portion involving the posterior superior aspect of the vertebral body with 30% canal compromise and read by radiologist as fractures likely unstable with bilateral pedicle fractures. -Neurosurgery was consulted and is following -Possibility of surgery is approximately 50% per neurosurgery -Continue with analgesia as needed for symptom relief -Upright xray reviewed. Findings of L4 compression fracture with 65% loss of vertebral body height. -Today discussed with Neurosurgery. Recommendation for continued PT as tolerated, if still no improvement by early next week, then possible surgery  Hypertension -resumed amlodipine 10 mg daily, benazepril 20 mg p.o. daily,  hydrochlorothiazide 25 mg p.o. daily, metoprolol tartrate 5 mg IV every 4 hours as needed for SBP -BP stable and controlled at this time  Hyperlipidemia -continue with pravastatin 20 mg p.o. daily at bedtime  Hypothyroid -continue home levothyroxine 88 mcg -Most recent TSH from 8/19 noted to be 1.576  Neuropathy -Continue with gabapentin 300 mg p.o. daily resumed  CKD 3b -Cr currently 1.39 - Cont to follow bmet trends  Constipation -Initially no results despite multiple cathartics -Good results ultimately with soap suds enema  DVT prophylaxis: Lovenox subq Code Status: Full Family Communication: Pt in room, family not at bedside  Status is: Inpt  Requires continued inpt status because: Ongoing active pain requiring inpatient pain management and Inpatient level of care appropriate due to severity of illness  Dispo: The patient is from: Home              Anticipated d/c is to: Unknown, will need PT eval               Anticipated d/c date is: 3 days              Patient currently is not medically stable to d/c.   Difficult to place patient No   Consultants:   Neurosurgery  Procedures:     Antimicrobials: Anti-infectives (From admission, onward)   None      Subjective: Reports feeling better after bowel movement  Objective: Vitals:   03/26/20 0408 03/26/20 0722 03/26/20 1115 03/26/20 1600  BP: 132/82 (!) 142/74 (!) 146/75 (!) 108/59  Pulse: 80 82 70 71  Resp: 16 16 16  16  Temp: 97.8 F (36.6 C) 98 F (36.7 C) 97.6 F (36.4 C) (!) 97.4 F (36.3 C)  TempSrc: Oral  Oral   SpO2: 94% 92% 93% 96%  Weight:      Height:        Intake/Output Summary (Last 24 hours) at 03/26/2020 1623 Last data filed at 03/26/2020 1413 Gross per 24 hour  Intake 480 ml  Output --  Net 480 ml   Filed Weights   03/23/20 0952 03/23/20 1112  Weight: 68 kg 68 kg    Examination: General exam: Conversant, in no acute distress Respiratory system: normal chest rise, clear,  no audible wheezing Cardiovascular system: regular rhythm, s1-s2 Gastrointestinal system: Nondistended, nontender, pos BS Central nervous system: No seizures, no tremors Extremities: No cyanosis, no joint deformities Skin: No rashes, no pallor Psychiatry: Affect normal // no auditory hallucinations   Data Reviewed: I have personally reviewed following labs and imaging studies  CBC: Recent Labs  Lab 03/23/20 1005 03/24/20 0456 03/26/20 0459  WBC 10.2 15.4* 13.7*  NEUTROABS 7.5  --   --   HGB 13.1 12.5 12.2  HCT 37.9 36.8 35.5*  MCV 94.5 93.9 95.2  PLT 286 299 761   Basic Metabolic Panel: Recent Labs  Lab 03/23/20 1005 03/24/20 0456 03/25/20 0612 03/26/20 0459  NA 138 137 136 135  K 4.2 4.6 4.2 4.2  CL 102 104 104 104  CO2 23 24 23 24   GLUCOSE 116* 137* 115* 114*  BUN 27* 20 17 20   CREATININE 1.53* 1.23* 1.32* 1.39*  CALCIUM 9.3 8.7* 8.5* 8.8*   GFR: Estimated Creatinine Clearance: 24.1 mL/min (A) (by C-G formula based on SCr of 1.39 mg/dL (H)). Liver Function Tests: Recent Labs  Lab 03/25/20 0612 03/26/20 0459  AST 18 18  ALT 11 10  ALKPHOS 74 78  BILITOT 0.3 0.6  PROT 6.2* 6.2*  ALBUMIN 3.2* 3.1*   No results for input(s): LIPASE, AMYLASE in the last 168 hours. No results for input(s): AMMONIA in the last 168 hours. Coagulation Profile: No results for input(s): INR, PROTIME in the last 168 hours. Cardiac Enzymes: No results for input(s): CKTOTAL, CKMB, CKMBINDEX, TROPONINI in the last 168 hours. BNP (last 3 results) No results for input(s): PROBNP in the last 8760 hours. HbA1C: No results for input(s): HGBA1C in the last 72 hours. CBG: No results for input(s): GLUCAP in the last 168 hours. Lipid Profile: No results for input(s): CHOL, HDL, LDLCALC, TRIG, CHOLHDL, LDLDIRECT in the last 72 hours. Thyroid Function Tests: No results for input(s): TSH, T4TOTAL, FREET4, T3FREE, THYROIDAB in the last 72 hours. Anemia Panel: No results for input(s):  VITAMINB12, FOLATE, FERRITIN, TIBC, IRON, RETICCTPCT in the last 72 hours. Sepsis Labs: No results for input(s): PROCALCITON, LATICACIDVEN in the last 168 hours.  Recent Results (from the past 240 hour(s))  SARS CORONAVIRUS 2 (TAT 6-24 HRS) Nasopharyngeal Nasopharyngeal Swab     Status: None   Collection Time: 03/23/20  2:01 PM   Specimen: Nasopharyngeal Swab  Result Value Ref Range Status   SARS Coronavirus 2 NEGATIVE NEGATIVE Final    Comment: (NOTE) SARS-CoV-2 target nucleic acids are NOT DETECTED.  The SARS-CoV-2 RNA is generally detectable in upper and lower respiratory specimens during the acute phase of infection. Negative results do not preclude SARS-CoV-2 infection, do not rule out co-infections with other pathogens, and should not be used as the sole basis for treatment or other patient management decisions. Negative results must be combined with clinical observations,  patient history, and epidemiological information. The expected result is Negative.  Fact Sheet for Patients: SugarRoll.be  Fact Sheet for Healthcare Providers: https://www.woods-mathews.com/  This test is not yet approved or cleared by the [place] FDA and  has been authorized for detection and/or diagnosis of SARS-CoV-2 by FDA under an Emergency Use Authorization (EUA). This EUA will remain  in effect (meaning this test can be used) for the duration of the COVID-19 declaration under Se ction 564(b)(1) of the Act, 21 U.S.C. section 360bbb-3(b)(1), unless the authorization is terminated or revoked sooner.  Performed at [facility], [place] [address], [place], [place] [address]      Radiology Studies: DG Abd 1 View  Result Date: 03/25/2020 CLINICAL DATA:  Abdominal pain. EXAM: ABDOMEN - 1 VIEW COMPARISON:  CT abdomen pelvis 03/23/2020 FINDINGS: Large hiatal hernia, partially visualized. Similar-appearing gaseous distension of likely the large bowel. No  definite obstructive bowel gas pattern. No pneumatosis identified. In findings to suggest free intraperitoneal gas. No radio-opaque calculi or other significant radiographic abnormality are seen. Multilevel degenerative changes of the spine. IMPRESSION: 1. No obstructive bowel gas pattern. 2. Large hiatal hernia partially visualized. Electronically Signed   By: Iven Finn M.D.   On: 03/25/2020 00:32    Scheduled Meds: . amLODipine  10 mg Oral Q breakfast  . benazepril  20 mg Oral Daily  . docusate sodium  100 mg Oral BID  . enoxaparin (LOVENOX) injection  30 mg Subcutaneous Q24H  . gabapentin  300 mg Oral Daily  . hydrochlorothiazide  25 mg Oral Daily  . levothyroxine  88 mcg Oral QAC breakfast  . polyethylene glycol  17 g Oral Daily  . pravastatin  20 mg Oral QHS   Continuous Infusions: . chlorproMAZINE (THORAZINE) IV Stopped (03/25/20 0132)     LOS: 2 days   Marylu Lund, MD [facility] Pager On Amion  If 7PM-7AM, please contact night-coverage 03/26/2020, 4:23 PM

## 2020-03-26 NOTE — Evaluation (Signed)
Occupational Therapy Evaluation Patient Details Name: Theresa Snyder MRN: [id number] DOB: 10/10/1929 Today's Date: 03/26/2020    History of Present Illness 87 y.o. female with medical history significant for lumbar spinal stenosis,, hypothyroid, hyperlipidemia, presents to the emergency department for chief concerns of life fall and worsening back pain. 03/23/20 Chest CT: Acute nondisplaced fractures of the left anterior second through fourth ribs and right anterior third through fifth ribs. Lumbar MRI: L4 compression fracture with 65% loss of vertebral body height centrally.   Clinical Impression   Theresa Snyder was seen for OT evaluation this date. Prior to hospital admission, pt was MOD I for mobility and I/ADLs including driving. Pt lives alone in mobile home c 5 STE and B rails. Pt reports son lives next door and daughter could be available PRN. Pt presents to acute OT demonstrating impaired ADL performance and functional mobility 2/2 pain, decreased activity tolerance, and functional strength/balance/ROM deficits.   Upon arrival pt supine in bed noted to be incontinent of urine/bowel. MAX A for log rolling to exit bed, MAX A don/doff TLSO brace in sitting and rolling at bed level. Initial sit<>stand pt incontinent of urine and returned to sitting. MOD A + HHA for BSC t/f and MAX A perihygiene in standing. MAX A for LBD seated EOB. Pt reports 10/10 pain with all mobility - chest pain greater than back pain. Pt tolerates ~5 steps forward/backward c MIN A + BUE support on OT.  Pt educated in functional application of back precautions, log roll technique, AE/DME for bathing/dressing/ toileting, and home/routines modifications. Pt verbalized understanding of all education/training provided. Handout provided to support recall and carry over of learned precautions/techniques. Pt would benefit from skilled OT to address noted impairments and functional limitations (see below for any additional details) in  order to maximize safety and independence while minimizing falls risk and caregiver burden. Upon hospital discharge, recommend STR to maximize pt safety and return to PLOF.     Follow Up Recommendations  SNF    Equipment Recommendations  Other (comment) (TBD)    Recommendations for Other Services       Precautions / Restrictions Precautions Precautions: Fall;Back Precaution Booklet Issued: Yes (comment) Required Braces or Orthoses: Spinal Brace Spinal Brace: Thoracolumbosacral orthotic Restrictions Weight Bearing Restrictions: No      Mobility Bed Mobility Overal bed mobility: Needs Assistance Bed Mobility: Rolling;Sidelying to Sit;Sit to Sidelying Rolling: Mod assist Sidelying to sit: Mod assist     Sit to sidelying: Mod assist General bed mobility comments: assist 2/2 10/10 back pain    Transfers Overall transfer level: Needs assistance Equipment used: 1 person hand held assist Transfers: Sit to/from Stand;Stand Pivot Transfers Sit to Stand: Min assist;From elevated surface Stand pivot transfers: Mod assist            Balance Overall balance assessment: Needs assistance Sitting-balance support: Bilateral upper extremity supported;Feet supported Sitting balance-Leahy Scale: Fair     Standing balance support: Bilateral upper extremity supported Standing balance-Leahy Scale: Fair                             ADL either performed or assessed with clinical judgement   ADL Overall ADL's : Needs assistance/impaired                                       General ADL Comments: MAX A  for LBD seated EOB. MOD A + HHA for BSC t/f and MAX A perihygiene in standing. MAX A don/doff TLSO brace in sitting and rolling at bed level                  Pertinent Vitals/Pain Pain Assessment: 0-10 Pain Score: 10-Worst pain ever Pain Location: back/chest Pain Descriptors / Indicators: Grimacing;Discomfort;Guarding Pain Intervention(s):  Limited activity within patient's tolerance;Premedicated before session;Monitored during session;Repositioned     Hand Dominance Right   Extremity/Trunk Assessment Upper Extremity Assessment Upper Extremity Assessment: Generalized weakness   Lower Extremity Assessment Lower Extremity Assessment: Generalized weakness       Communication Communication Communication: No difficulties   Cognition Arousal/Alertness: Awake/alert Behavior During Therapy: WFL for tasks assessed/performed Overall Cognitive Status: Within Functional Limits for tasks assessed                                     General Comments       Exercises Exercises: Other exercises Other Exercises Other Exercises: Pt educated re: PT role, DME recs, d/c recs, falls prevention, log roll technique, funcitonal application of back precaustion, adapted dressing techniques Other Exercises: LBD, UBD, toileting, sup<>sit, sit<>stand, sitting/standing balance/tolerance   Shoulder Instructions      Home Living Family/patient expects to be discharged to:: Private residence Living Arrangements: Alone Available Help at Discharge: Family;Available PRN/intermittently Type of Home: Mobile home Home Access: Stairs to enter Entrance Stairs-Number of Steps: 5 Entrance Stairs-Rails: Can reach both Home Layout: One level               Home Equipment: Walker - 4 wheels          Prior Functioning/Environment Level of Independence: Independent with assistive device(s)        Comments: Pt reports MOD I using 4WW PRN        OT Problem List: Decreased strength;Decreased range of motion;Decreased activity tolerance;Impaired balance (sitting and/or standing);Decreased safety awareness;Pain      OT Treatment/Interventions: Self-care/ADL training;Therapeutic exercise;Energy conservation;DME and/or AE instruction;Therapeutic activities;Balance training;Patient/family education    OT Goals(Current goals can  be found in the care plan section) Acute Rehab OT Goals Patient Stated Goal: To return to PLOF OT Goal Formulation: With patient Time For Goal Achievement: 04/09/20 Potential to Achieve Goals: Good ADL Goals Pt Will Perform Grooming: with supervision;with set-up;sitting Pt Will Perform Upper Body Dressing: with min assist;sitting Pt Will Transfer to Toilet: with min assist;ambulating;bedside commode (c LRAD PRN)  OT Frequency: Min 1X/week   Barriers to D/C: Decreased caregiver support             AM-PAC OT "6 Clicks" Daily Activity     Outcome Measure Help from another person eating meals?: None Help from another person taking care of personal grooming?: A Little Help from another person toileting, which includes using toliet, bedpan, or urinal?: A Lot Help from another person bathing (including washing, rinsing, drying)?: A Lot Help from another person to put on and taking off regular upper body clothing?: A Lot Help from another person to put on and taking off regular lower body clothing?: A Lot 6 Click Score: 15   End of Session Nurse Communication: Mobility status  Activity Tolerance: Patient tolerated treatment well Patient left: in bed;with call bell/phone within reach;with bed alarm set;with nursing/sitter in room  OT Visit Diagnosis: Unsteadiness on feet (R26.81);Other abnormalities of gait and mobility (R26.89);Muscle weakness (generalized) (M62.81)  Time: 3893-7342 OT Time Calculation (min): 35 min Charges:  OT General Charges $OT Visit: 1 Visit OT Evaluation $OT Eval Moderate Complexity: 1 Mod OT Treatments $Self Care/Home Management : 23-37 mins  Dessie Coma, M.S. OTR/L  03/26/20, 2:29 PM  ascom [phone]

## 2020-03-26 NOTE — Evaluation (Signed)
Physical Therapy Evaluation Patient Details Name: Theresa Snyder MRN: [id number] DOB: Dec 03, 1929 Today's Date: 03/26/2020   History of Present Illness  87 y.o. female with medical history significant for lumbar spinal stenosis,, hypothyroid, hyperlipidemia, presents to the emergency department for chief concerns of life fall and worsening back pain. 03/23/20 Chest CT: Acute nondisplaced fractures of the left anterior second through fourth ribs and right anterior third through fifth ribs. Lumbar MRI: L4 compression fracture with 65% loss of vertebral body height centrally.     Clinical Impression  Pt received in Semi-Fowler's position and agreeable to therapy.  Daughter was present in room for session.  Pt was able to perform bed level exercises with good technique.  Pt did exhibit labored breathing while performing, however noted to have sats >90% throughout the session.  Pt was educated on the importance of log-rolling when in bed, however has difficulty performing due to pain in the ribs.  Pt required modA for transfer to seated EOB.  Once given verbal cues for hand placement, pt was able to transfer from seated EOB to standing with CGA.  Pt TLSO was donned and daughter and pt were educated on proper use of braces and how to don/doff.  Pt then requested to use the restroom.  Pt was able to ambulate to the bathroom and perform transfer to toilet where therapist assisted with self-care.  Pt then proceeded to ambulate back to the bed where she perform STS x4.  Nursing came in to assist with changing of the patch on her buttock area and noticed a wound on her L buttocks region.  Pt was able to stand >4 minutes with UE support while dressing was changed.  Pt then sat at EOB and performed seated exercises as well.  Pt was transferred back to bed with modA for getting into supine positioning.  Pt and daughter were educated on SNF recommendation at this current time with pt not agreeable to facility.  Pt will  benefit from skilled PT intervention to increase independence and safety with basic mobility in preparation for discharge to STR at SNF due to decreased caregiver support at home.        Follow Up Recommendations SNF;Supervision for mobility/OOB    Equipment Recommendations  Rolling walker with 5" wheels;3in1 (PT)    Recommendations for Other Services       Precautions / Restrictions Precautions Precautions: Fall;Back Precaution Booklet Issued: Yes (comment) Required Braces or Orthoses: Spinal Brace Spinal Brace: Thoracolumbosacral orthotic Restrictions Weight Bearing Restrictions: No      Mobility  Bed Mobility Overal bed mobility: Needs Assistance Bed Mobility: Rolling;Sidelying to Sit;Sit to Sidelying Rolling: Mod assist Sidelying to sit: Mod assist     Sit to sidelying: Mod assist General bed mobility comments: assisted with transfer due to back pain    Transfers Overall transfer level: Needs assistance Equipment used: Rolling walker (2 wheeled) Transfers: Sit to/from Stand Sit to Stand: From elevated surface;Min guard Stand pivot transfers: Mod assist          Ambulation/Gait Ambulation/Gait assistance: Min guard Gait Distance (Feet): 40 Feet Assistive device: Rolling walker (2 wheeled) Gait Pattern/deviations: WFL(Within Functional Limits) Gait velocity: decreased      Stairs            Wheelchair Mobility    Modified Rankin (Stroke Patients Only)       Balance Overall balance assessment: Needs assistance Sitting-balance support: Feet supported Sitting balance-Leahy Scale: Fair     Standing balance support: Bilateral upper  extremity supported Standing balance-Leahy Scale: Fair                               Pertinent Vitals/Pain Pain Assessment: 0-10 Pain Score: 7  Pain Location: back/chest Pain Descriptors / Indicators: Grimacing;Discomfort;Guarding Pain Intervention(s): Limited activity within patient's  tolerance;Monitored during session;RN gave pain meds during session;Repositioned    Home Living Family/patient expects to be discharged to:: Private residence Living Arrangements: Alone Available Help at Discharge: Family;Available PRN/intermittently Type of Home: Mobile home Home Access: Stairs to enter Entrance Stairs-Rails: Can reach both Entrance Stairs-Number of Steps: 5 Home Layout: One level Home Equipment: Walker - 4 wheels      Prior Function Level of Independence: Independent with assistive device(s)         Comments: Pt reports MOD I using 4WW PRN     Hand Dominance   Dominant Hand: Right    Extremity/Trunk Assessment   Upper Extremity Assessment Upper Extremity Assessment: Generalized weakness    Lower Extremity Assessment Lower Extremity Assessment: Generalized weakness    Cervical / Trunk Assessment Cervical / Trunk Assessment: Kyphotic  Communication   Communication: No difficulties  Cognition Arousal/Alertness: Awake/alert Behavior During Therapy: WFL for tasks assessed/performed Overall Cognitive Status: Within Functional Limits for tasks assessed                                        General Comments      Exercises Total Joint Exercises Ankle Circles/Pumps: AROM;Strengthening;Both;10 reps;Supine Quad Sets: AROM;Strengthening;Both;10 reps;Supine Gluteal Sets: AROM;Strengthening;Both;10 reps;Supine Hip ABduction/ADduction: AROM;Strengthening;Both;10 reps;Supine Straight Leg Raises: AROM;Strengthening;Both;10 reps;Supine Long Arc Quad: AROM;Strengthening;Both;10 reps;Seated Marching in Standing: AROM;Strengthening;Both;10 reps;Standing Other Exercises Other Exercises: Pt educated on role of PT and services provided during hospital stay. Other Exercises: Pt educated on physiological benefits of exercising in between therapy sessions and encouraged to perform while in bed to increase overall strength and endurance needed for  return to PLOF.   Assessment/Plan    PT Assessment Patient needs continued PT services  PT Problem List Decreased strength;Decreased activity tolerance;Decreased balance;Decreased mobility;Decreased knowledge of use of DME;Decreased knowledge of precautions;Pain       PT Treatment Interventions DME instruction;Gait training;Functional mobility training;Therapeutic activities;Therapeutic exercise;Neuromuscular re-education;Patient/family education    PT Goals (Current goals can be found in the Care Plan section)  Acute Rehab PT Goals Patient Stated Goal: To return home PT Goal Formulation: With patient/family Time For Goal Achievement: 04/09/20 Potential to Achieve Goals: Fair    Frequency 7X/week   Barriers to discharge Decreased caregiver support Pt's daughter is currently disable and would not be able to assist with lifting pt's if necessary.    Co-evaluation               AM-PAC PT "6 Clicks" Mobility  Outcome Measure Help needed turning from your back to your side while in a flat bed without using bedrails?: A Lot Help needed moving from lying on your back to sitting on the side of a flat bed without using bedrails?: A Lot Help needed moving to and from a bed to a chair (including a wheelchair)?: A Little Help needed standing up from a chair using your arms (e.g., wheelchair or bedside chair)?: A Little Help needed to walk in hospital room?: A Little Help needed climbing 3-5 steps with a railing? : A Lot 6 Click Score: 15  End of Session Equipment Utilized During Treatment: Gait belt;Back brace Activity Tolerance: Patient limited by fatigue;Patient limited by pain Patient left: in bed;with call bell/phone within reach;with nursing/sitter in room Nurse Communication: Mobility status PT Visit Diagnosis: Unsteadiness on feet (R26.81);Other abnormalities of gait and mobility (R26.89);Muscle weakness (generalized) (M62.81);History of falling (Z91.81);Difficulty in  walking, not elsewhere classified (R26.2);Pain Pain - Right/Left:  (LBP and Ribs)    Time: 8469-6295 PT Time Calculation (min) (ACUTE ONLY): 75 min   Charges:   PT Evaluation $PT Eval Low Complexity: 1 Low PT Treatments $Gait Training: 23-37 mins $Therapeutic Exercise: 23-37 mins $Self Care/Home Management: 8-22        Gwenlyn Saran, PT, DPT 03/26/20, 4:58 PM

## 2020-03-27 DIAGNOSIS — E039 Hypothyroidism, unspecified: Secondary | ICD-10-CM

## 2020-03-27 DIAGNOSIS — I150 Renovascular hypertension: Secondary | ICD-10-CM

## 2020-03-27 DIAGNOSIS — S32048A Other fracture of fourth lumbar vertebra, initial encounter for closed fracture: Secondary | ICD-10-CM | POA: Diagnosis not present

## 2020-03-27 DIAGNOSIS — N1832 Chronic kidney disease, stage 3b: Secondary | ICD-10-CM

## 2020-03-27 NOTE — Progress Notes (Signed)
Physical Therapy Treatment Patient Details Name: Theresa Snyder MRN: [id number] DOB: March 24, 1929 Today's Date: 03/27/2020    History of Present Illness 87 y.o. female with medical history significant for lumbar spinal stenosis,, hypothyroid, hyperlipidemia, presents to the emergency department for chief concerns of life fall and worsening back pain. 03/23/20 Chest CT: Acute nondisplaced fractures of the left anterior second through fourth ribs and right anterior third through fifth ribs. Lumbar MRI: L4 compression fracture with 65% loss of vertebral body height centrally.    PT Comments    Pt received in long sitting in bed, agreed to PT.  Nursing stated pt had received pain meds prior to session.  Pt with 8/10 lumbar back pain at rest.  Discussed POC and re-educated pt on log roll technique. Pt required ModA to roll L<>R with use of side rail.  Dependent for back brace application.  Side to sit EOB with ModA, brace adjusted, good sitting balance noted, no c/o dizziness.  Pt able to stand from raised bed with CGA to RW, incontinent of urine.  Pt assisted with hygiene followed by advancing ~4ft to bedside chair with RW and CGA.  Pt completed seated B LE AROM exercises x 10.  Overall great tolerance for mobility.  Pt's pain increased primarily during brace application prior to sitting EOB.  ? If brace could be applied once sitting or try different pain meds prior to next session.  Pt left sitting up in chair with call bell and phone in reach.  Great tolerance for PT session.     Follow Up Recommendations  SNF;Supervision for mobility/OOB     Equipment Recommendations  Rolling walker with 5" wheels;3in1 (PT)    Recommendations for Other Services       Precautions / Restrictions Precautions Precautions: Fall;Back Precaution Booklet Issued: Yes (comment) Precaution Comments: Log Roll Required Braces or Orthoses: Spinal Brace Spinal Brace: Thoracolumbosacral orthotic Restrictions Weight  Bearing Restrictions: No    Mobility  Bed Mobility Overal bed mobility: Needs Assistance Bed Mobility: Rolling;Sidelying to Sit;Sit to Sidelying Rolling: Mod assist Sidelying to sit: Mod assist     Sit to sidelying: Mod assist General bed mobility comments:  (dependent for TLSO application)    Transfers Overall transfer level: Needs assistance Equipment used: Rolling walker (2 wheeled) Transfers: Sit to/from Stand Sit to Stand: From elevated surface;Min guard Stand pivot transfers: Min guard       General transfer comment: steady with RW, incontinent of urine upon standing  Ambulation/Gait Ambulation/Gait assistance: Min guard Gait Distance (Feet): 5 Feet Assistive device: Rolling walker (2 wheeled) Gait Pattern/deviations: WFL(Within Functional Limits) Gait velocity: decreased       Stairs             Wheelchair Mobility    Modified Rankin (Stroke Patients Only)       Balance                                            Cognition Arousal/Alertness: Awake/alert Behavior During Therapy: WFL for tasks assessed/performed Overall Cognitive Status: Within Functional Limits for tasks assessed                                 General Comments: Multiple rest breaks due to pain      Exercises Total Joint Exercises Ankle Circles/Pumps: AROM;Strengthening;Both;10 reps;Supine  Long Arc Quad: AROM;Strengthening;Both;10 reps;Seated Other Exercises Other Exercises:  (Pt educated on log roll technique and transition from sidelying to sitting.)    General Comments General comments (skin integrity, edema, etc.): Increased time required due to rest breaks due to pain      Pertinent Vitals/Pain Pain Assessment: 0-10 Pain Score: 8  Pain Location: back/chest Pain Descriptors / Indicators: Grimacing;Discomfort;Guarding Pain Intervention(s): Premedicated before session    Home Living                      Prior Function             PT Goals (current goals can now be found in the care plan section) Acute Rehab PT Goals Patient Stated Goal: To return home    Frequency    7X/week      PT Plan      Co-evaluation              AM-PAC PT "6 Clicks" Mobility   Outcome Measure  Help needed turning from your back to your side while in a flat bed without using bedrails?: A Lot Help needed moving from lying on your back to sitting on the side of a flat bed without using bedrails?: A Lot Help needed moving to and from a bed to a chair (including a wheelchair)?: A Little Help needed standing up from a chair using your arms (e.g., wheelchair or bedside chair)?: A Little Help needed to walk in hospital room?: A Little Help needed climbing 3-5 steps with a railing? : A Lot 6 Click Score: 15    End of Session Equipment Utilized During Treatment: Gait belt;Back brace Activity Tolerance: Patient limited by pain Patient left: in chair;with call bell/phone within reach Nurse Communication: Mobility status PT Visit Diagnosis: Unsteadiness on feet (R26.81);Other abnormalities of gait and mobility (R26.89);Muscle weakness (generalized) (M62.81);History of falling (Z91.81);Difficulty in walking, not elsewhere classified (R26.2);Pain Pain - Right/Left:  (lumbar region) Pain - part of body:  (Back)     Time: 1010-1106 PT Time Calculation (min) (ACUTE ONLY): 56 min  Charges:  $Therapeutic Exercise: 8-22 mins $Therapeutic Activity: 38-52 mins                     Mikel Cella, PTA   Theresa Snyder 03/27/2020, 12:13 PM

## 2020-03-27 NOTE — Progress Notes (Signed)
PROGRESS NOTE    Theresa Snyder  JIR:[id number] DOB: 12/14/1929 DOA: 03/23/2020 PCP: [facility] [place]    Brief Narrative:  87 y.o. female with medical history significant for lumbar spinal stenosis,, hypothyroid, hyperlipidemia, presents to the emergency department for chief concerns of life fall and worsening back pain.  She walked to mail box and fell face first and hit her chest 03/22/20. She then got up and walked back to her car and drove up her driveway to her house. She normally uses the rollator walker to ambulate however she didn't use her walker to go to the mail box yesterday when she fell.   She went to her car and took it easy yesterday and slept on her couch last night. She took a hydrocodon 10 mg once and went to bed. She denies feeling numbness in her legs or gluteal.   She presents to the ED because of chest pain and shortness of breath with inhalation.   Assessment & Plan:   Principal Problem:   L4 vertebral fracture (HCC) Active Problems:   Chronic kidney disease   BP (high blood pressure)   Adult hypothyroidism   HLD (hyperlipidemia)   Fall   Lumbar compression fracture (HCC)    Acute L4 compression fracture-present on admission secondary to mechanical fall -With mild retrial portion involving the posterior superior aspect of the vertebral body with 30% canal compromise and read by radiologist as fractures likely unstable with bilateral pedicle fractures. -Possibility of surgery is approximately 50% per neurosurgery -Continue with analgesia as needed for symptom relief -Upright xray reviewed. Findings of L4 compression fracture with 65% loss of vertebral body height. -Neurosurgery -> Recommendation for continued PT as tolerated, if still no improvement by early next week, then possible surgery. -I have requested neurosurgery to follow-up on her today for disposition plans  Hypertension -resumed amlodipine 10 mg daily, benazepril 20  mg p.o. daily, hydrochlorothiazide 25 mg p.o. daily, metoprolol tartrate 5 mg IV every 4 hours as needed for SBP -BP stable and controlled at this time  Hyperlipidemia -continue with pravastatin 20 mg p.o. daily at bedtime  Hypothyroid -continue home levothyroxine 88 mcg -Most recent TSH from 8/19 noted to be 1.576  Neuropathy -Continue with gabapentin 300 mg p.o. daily resumed  CKD 3b -Cr currently 1.39 - Cont to follow bmet trends  Constipation -Resolved  DVT prophylaxis: Lovenox subq Code Status: Full Family Communication: Pt in room, family not at bedside  Status is: Inpt  Requires continued inpt status because: Ongoing active pain requiring inpatient pain management and Inpatient level of care appropriate due to severity of illness  Dispo: The patient is from: Home              Anticipated d/c is to: Home versus SNF - PT recommends SNF but patient refuses SNF and prefers to go home.  She has good support system including son who lives a few blocks from her and daughter is also local.  She is fairly independent otherwise              Anticipated d/c date is: 2 days              Patient currently is not medically stable to d/c.   Difficult to place patient No   Consultants:   Neurosurgery  Procedures:     Antimicrobials: Anti-infectives (From admission, onward)   None      Subjective: Denies any new complaints.  Very pleasant and appreciative of her  care here.  She requests discharge to home when she is ready for rather than to SNF.  Objective: Vitals:   03/27/20 0335 03/27/20 0759 03/27/20 1124 03/27/20 1523  BP: 112/62 (!) 161/81 (!) 148/61 118/69  Pulse: (!) 52 90 (!) 110 82  Resp: 16 20 18 18   Temp: 97.9 F (36.6 C) 98 F (36.7 C) 97.7 F (36.5 C) 98 F (36.7 C)  TempSrc: Oral  Oral Oral  SpO2: 94% 97% 95% 93%  Weight:      Height:        Intake/Output Summary (Last 24 hours) at 03/27/2020 1547 Last data filed at 03/27/2020 1407 Gross per  24 hour  Intake 240 ml  Output 800 ml  Net -560 ml   Filed Weights   03/23/20 0952 03/23/20 1112  Weight: 68 kg 68 kg    Examination: General exam: Conversant, in no acute distress Respiratory system: normal chest rise, clear, no audible wheezing Cardiovascular system: regular rhythm, s1-s2 Gastrointestinal system: Nondistended, nontender, pos BS Central nervous system: No seizures, no tremors Extremities: No cyanosis, no joint deformities Skin: No rashes, no pallor Psychiatry: Affect normal // no auditory hallucinations   Data Reviewed: I have personally reviewed following labs and imaging studies  CBC: Recent Labs  Lab 03/23/20 1005 03/24/20 0456 03/26/20 0459  WBC 10.2 15.4* 13.7*  NEUTROABS 7.5  --   --   HGB 13.1 12.5 12.2  HCT 37.9 36.8 35.5*  MCV 94.5 93.9 95.2  PLT 286 299 932   Basic Metabolic Panel: Recent Labs  Lab 03/23/20 1005 03/24/20 0456 03/25/20 0612 03/26/20 0459  NA 138 137 136 135  K 4.2 4.6 4.2 4.2  CL 102 104 104 104  CO2 23 24 23 24   GLUCOSE 116* 137* 115* 114*  BUN 27* 20 17 20   CREATININE 1.53* 1.23* 1.32* 1.39*  CALCIUM 9.3 8.7* 8.5* 8.8*   GFR: Estimated Creatinine Clearance: 24.1 mL/min (A) (by C-G formula based on SCr of 1.39 mg/dL (H)). Liver Function Tests: Recent Labs  Lab 03/25/20 0612 03/26/20 0459  AST 18 18  ALT 11 10  ALKPHOS 74 78  BILITOT 0.3 0.6  PROT 6.2* 6.2*  ALBUMIN 3.2* 3.1*   No results for input(s): LIPASE, AMYLASE in the last 168 hours. No results for input(s): AMMONIA in the last 168 hours. Coagulation Profile: No results for input(s): INR, PROTIME in the last 168 hours. Cardiac Enzymes: No results for input(s): CKTOTAL, CKMB, CKMBINDEX, TROPONINI in the last 168 hours. BNP (last 3 results) No results for input(s): PROBNP in the last 8760 hours. HbA1C: No results for input(s): HGBA1C in the last 72 hours. CBG: No results for input(s): GLUCAP in the last 168 hours. Lipid Profile: No results  for input(s): CHOL, HDL, LDLCALC, TRIG, CHOLHDL, LDLDIRECT in the last 72 hours. Thyroid Function Tests: No results for input(s): TSH, T4TOTAL, FREET4, T3FREE, THYROIDAB in the last 72 hours. Anemia Panel: No results for input(s): VITAMINB12, FOLATE, FERRITIN, TIBC, IRON, RETICCTPCT in the last 72 hours. Sepsis Labs: No results for input(s): PROCALCITON, LATICACIDVEN in the last 168 hours.  Recent Results (from the past 240 hour(s))  SARS CORONAVIRUS 2 (TAT 6-24 HRS) Nasopharyngeal Nasopharyngeal Swab     Status: None   Collection Time: 03/23/20  2:01 PM   Specimen: Nasopharyngeal Swab  Result Value Ref Range Status   SARS Coronavirus 2 NEGATIVE NEGATIVE Final    Comment: (NOTE) SARS-CoV-2 target nucleic acids are NOT DETECTED.  The SARS-CoV-2 RNA is generally detectable  in upper and lower respiratory specimens during the acute phase of infection. Negative results do not preclude SARS-CoV-2 infection, do not rule out co-infections with other pathogens, and should not be used as the sole basis for treatment or other patient management decisions. Negative results must be combined with clinical observations, patient history, and epidemiological information. The expected result is Negative.  Fact Sheet for Patients: SugarRoll.be  Fact Sheet for Healthcare Providers: https://www.woods-mathews.com/  This test is not yet approved or cleared by the [place] FDA and  has been authorized for detection and/or diagnosis of SARS-CoV-2 by FDA under an Emergency Use Authorization (EUA). This EUA will remain  in effect (meaning this test can be used) for the duration of the COVID-19 declaration under Se ction 564(b)(1) of the Act, 21 U.S.C. section 360bbb-3(b)(1), unless the authorization is terminated or revoked sooner.  Performed at [facility], [place] [address], [place], [place] [address]      Radiology Studies: No results  found.  Scheduled Meds: . amLODipine  10 mg Oral Q breakfast  . benazepril  20 mg Oral Daily  . docusate sodium  100 mg Oral BID  . enoxaparin (LOVENOX) injection  30 mg Subcutaneous Q24H  . gabapentin  300 mg Oral Daily  . hydrochlorothiazide  25 mg Oral Daily  . levothyroxine  88 mcg Oral QAC breakfast  . polyethylene glycol  17 g Oral Daily  . pravastatin  20 mg Oral QHS   Continuous Infusions: . chlorproMAZINE (THORAZINE) IV Stopped (03/25/20 0132)     LOS: 3 days   Max Sane, MD [facility] Pager On Amion  If 7PM-7AM, please contact night-coverage 03/27/2020, 3:47 PM

## 2020-03-28 DIAGNOSIS — I150 Renovascular hypertension: Secondary | ICD-10-CM | POA: Diagnosis not present

## 2020-03-28 DIAGNOSIS — E039 Hypothyroidism, unspecified: Secondary | ICD-10-CM | POA: Diagnosis not present

## 2020-03-28 DIAGNOSIS — W19XXXA Unspecified fall, initial encounter: Secondary | ICD-10-CM

## 2020-03-28 DIAGNOSIS — N1832 Chronic kidney disease, stage 3b: Secondary | ICD-10-CM | POA: Diagnosis not present

## 2020-03-28 DIAGNOSIS — S32048A Other fracture of fourth lumbar vertebra, initial encounter for closed fracture: Secondary | ICD-10-CM | POA: Diagnosis not present

## 2020-03-28 NOTE — Progress Notes (Signed)
PROGRESS NOTE    Theresa Snyder  WEX:[id number] DOB: 06-Sep-1929 DOA: 03/23/2020 PCP: [facility] [place]    Brief Narrative:  87 y.o. female with medical history significant for lumbar spinal stenosis,, hypothyroid, hyperlipidemia, presents to the emergency department for chief concerns of life fall and worsening back pain.  She walked to mail box and fell face first and hit her chest 03/22/20. She then got up and walked back to her car and drove up her driveway to her house. She normally uses the rollator walker to ambulate however she didn't use her walker to go to the mail box yesterday when she fell.   She went to her car and took it easy yesterday and slept on her couch last night. She took a hydrocodon 10 mg once and went to bed. She denies feeling numbness in her legs or gluteal.   She presents to the ED because of chest pain and shortness of breath with inhalation.   Assessment & Plan:   Principal Problem:   L4 vertebral fracture (HCC) Active Problems:   Chronic kidney disease   BP (high blood pressure)   Adult hypothyroidism   HLD (hyperlipidemia)   Fall   Lumbar compression fracture (HCC)    Acute L4 compression fracture-present on admission secondary to mechanical fall -With mild retrial portion involving the posterior superior aspect of the vertebral body with 30% canal compromise and read by radiologist as fractures likely unstable with bilateral pedicle fractures. -Pain is well controlled at this time -Upright xray reviewed. Findings of L4 compression fracture with 65% loss of vertebral body height. -Neurosurgery -> no plan for surgery for now.  Outpatient follow-up at discharge  Hypertension -Continue amlodipine 10 mg daily, benazepril 20 mg p.o. daily, hydrochlorothiazide 25 mg p.o. daily -BP stable and controlled at this time  Hyperlipidemia -continue with pravastatin 20 mg p.o. daily at bedtime  Hypothyroid -continue home  levothyroxine 88 mcg -Most recent TSH from 8/19 noted to be 1.576  Neuropathy -Continue with gabapentin 300 mg p.o. daily resumed  CKD 3b -Cr currently 1.39 - Cont to follow bmet trends  Constipation -Resolved  DVT prophylaxis: Lovenox subq Code Status: Full Family Communication: Updated patient's daughter over phone while in the room on 2/17  Status is: Inpt  Requires continued inpt status because: Ongoing active pain requiring inpatient pain management and Inpatient level of care appropriate due to severity of illness  Dispo: The patient is from: Home              Anticipated d/c is to: Home with home health              Anticipated d/c date is: 1 day              Patient currently is not medically stable to d/c.   Difficult to place patient No   Consultants:   Neurosurgery  Subjective: Pain well controlled.  Agreeable with discharge tomorrow.  Objective: Vitals:   03/28/20 0356 03/28/20 0809 03/28/20 1000 03/28/20 1149  BP: (!) 167/80 (!) 160/74 (!) 146/89 (!) 159/74  Pulse: 98 86 100 (!) 52  Resp: 16 16  16   Temp: 97.8 F (36.6 C) 97.9 F (36.6 C)  98.1 F (36.7 C)  TempSrc: Oral     SpO2: 96% 95%  98%  Weight:      Height:        Intake/Output Summary (Last 24 hours) at 03/28/2020 1500 Last data filed at 03/28/2020 0023 Gross per  24 hour  Intake --  Output 450 ml  Net -450 ml   Filed Weights   03/23/20 0952 03/23/20 1112  Weight: 68 kg 68 kg    Examination: General exam: Conversant, in no acute distress Respiratory system: normal chest rise, clear, no audible wheezing Cardiovascular system: regular rhythm, s1-s2 Gastrointestinal system: Nondistended, nontender, pos BS Central nervous system: No seizures, no tremors Extremities: No cyanosis, no joint deformities Skin: No rashes, no pallor Psychiatry: Affect normal // no auditory hallucinations   Data Reviewed: I have personally reviewed following labs and imaging studies  CBC: Recent Labs   Lab 03/23/20 1005 03/24/20 0456 03/26/20 0459  WBC 10.2 15.4* 13.7*  NEUTROABS 7.5  --   --   HGB 13.1 12.5 12.2  HCT 37.9 36.8 35.5*  MCV 94.5 93.9 95.2  PLT 286 299 622   Basic Metabolic Panel: Recent Labs  Lab 03/23/20 1005 03/24/20 0456 03/25/20 0612 03/26/20 0459  NA 138 137 136 135  K 4.2 4.6 4.2 4.2  CL 102 104 104 104  CO2 23 24 23 24   GLUCOSE 116* 137* 115* 114*  BUN 27* 20 17 20   CREATININE 1.53* 1.23* 1.32* 1.39*  CALCIUM 9.3 8.7* 8.5* 8.8*   GFR: Estimated Creatinine Clearance: 24.1 mL/min (A) (by C-G formula based on SCr of 1.39 mg/dL (H)). Liver Function Tests: Recent Labs  Lab 03/25/20 0612 03/26/20 0459  AST 18 18  ALT 11 10  ALKPHOS 74 78  BILITOT 0.3 0.6  PROT 6.2* 6.2*  ALBUMIN 3.2* 3.1*    Recent Results (from the past 240 hour(s))  SARS CORONAVIRUS 2 (TAT 6-24 HRS) Nasopharyngeal Nasopharyngeal Swab     Status: None   Collection Time: 03/23/20  2:01 PM   Specimen: Nasopharyngeal Swab  Result Value Ref Range Status   SARS Coronavirus 2 NEGATIVE NEGATIVE Final    Comment: (NOTE) SARS-CoV-2 target nucleic acids are NOT DETECTED.  The SARS-CoV-2 RNA is generally detectable in upper and lower respiratory specimens during the acute phase of infection. Negative results do not preclude SARS-CoV-2 infection, do not rule out co-infections with other pathogens, and should not be used as the sole basis for treatment or other patient management decisions. Negative results must be combined with clinical observations, patient history, and epidemiological information. The expected result is Negative.  Fact Sheet for Patients: SugarRoll.be  Fact Sheet for Healthcare Providers: https://www.woods-mathews.com/  This test is not yet approved or cleared by the [place] FDA and  has been authorized for detection and/or diagnosis of SARS-CoV-2 by FDA under an Emergency Use Authorization (EUA). This EUA  will remain  in effect (meaning this test can be used) for the duration of the COVID-19 declaration under Se ction 564(b)(1) of the Act, 21 U.S.C. section 360bbb-3(b)(1), unless the authorization is terminated or revoked sooner.  Performed at [facility], [place] [address] [place], [place] [address]      Radiology Studies: No results found.  Scheduled Meds: . amLODipine  10 mg Oral Q breakfast  . benazepril  20 mg Oral Daily  . docusate sodium  100 mg Oral BID  . enoxaparin (LOVENOX) injection  30 mg Subcutaneous Q24H  . gabapentin  300 mg Oral Daily  . hydrochlorothiazide  25 mg Oral Daily  . levothyroxine  88 mcg Oral QAC breakfast  . polyethylene glycol  17 g Oral Daily  . pravastatin  20 mg Oral QHS   Continuous Infusions: . chlorproMAZINE (THORAZINE) IV Stopped (03/25/20 0132)  LOS: 4 days   Max Sane, MD [facility] Pager On Amion  If 7PM-7AM, please contact night-coverage 03/28/2020, 3:00 PM

## 2020-03-28 NOTE — Progress Notes (Signed)
Physical Therapy Treatment Patient Details Name: Theresa Snyder MRN: [id number] DOB: Aug 16, 1929 Today's Date: 03/28/2020    History of Present Illness 87 y.o. female with medical history significant for lumbar spinal stenosis,, hypothyroid, hyperlipidemia, presents to the emergency department for chief concerns of life fall and worsening back pain. 03/23/20 Chest CT: Acute nondisplaced fractures of the left anterior second through fourth ribs and right anterior third through fifth ribs. Lumbar MRI: L4 compression fracture with 65% loss of vertebral body height centrally.    PT Comments    Excellent tolerance for functional mobility today. Pt pre-medicated prior to session. Increased time spent with multiple transfers bed<>commode.  Pt required MinA for bed mobility with education on log rolling. CGA for sit<>stand transfers and stand pivot transfers without assistive device.  Pt able to demonstrate safe gait training with use of RW and light CGA 83ft.  Modified TLSO applied prior to mobility with good tolerance.  Pt appears safe to return home with home health PT and assistance from family.  No equipment needs at this time per pt.   Follow Up Recommendations  Home health PT     Equipment Recommendations  None recommended by PT    Recommendations for Other Services       Precautions / Restrictions Precautions Precautions: Fall;Back Precaution Booklet Issued: Yes (comment) Precaution Comments: Log Roll Required Braces or Orthoses: Spinal Brace Spinal Brace: Thoracolumbosacral orthotic    Mobility  Bed Mobility Overal bed mobility: Needs Assistance Bed Mobility: Rolling;Sidelying to Sit;Sit to Sidelying Rolling: Min assist Sidelying to sit: Min assist     Sit to sidelying: Min assist General bed mobility comments:  (Less assistance needed for mobility)    Transfers Overall transfer level: Needs assistance Equipment used: Rolling walker (2 wheeled) Transfers: Sit to/from  Stand Sit to Stand: From elevated surface;Min guard Stand pivot transfers: Min guard       General transfer comment:  (Steady with RW)  Ambulation/Gait Ambulation/Gait assistance: Min guard Gait Distance (Feet): 20 Feet Assistive device: Rolling walker (2 wheeled) Gait Pattern/deviations: WFL(Within Functional Limits) Gait velocity: decreased       Stairs             Wheelchair Mobility    Modified Rankin (Stroke Patients Only)       Balance                                            Cognition Arousal/Alertness: Awake/alert Behavior During Therapy: WFL for tasks assessed/performed Overall Cognitive Status: Within Functional Limits for tasks assessed                                 General Comments:  (Improved tolerance for mobility today)      Exercises      General Comments General comments (skin integrity, edema, etc.):  (Increased time spent educating pt on log rolling and spinal precautions to prevent twisting at lumbar region. Pt with good understanding.  Reviewed ther ex and educated pt on POC)      Pertinent Vitals/Pain Pain Assessment: 0-10 Pain Score: 2  Pain Location: back/chest Pain Descriptors / Indicators: Sore Pain Intervention(s): Premedicated before session    Home Living                      Prior  Function            PT Goals (current goals can now be found in the care plan section) Acute Rehab PT Goals Patient Stated Goal: To return home    Frequency    7X/week      PT Plan      Co-evaluation              AM-PAC PT "6 Clicks" Mobility   Outcome Measure  Help needed turning from your back to your side while in a flat bed without using bedrails?: A Little Help needed moving from lying on your back to sitting on the side of a flat bed without using bedrails?: A Little Help needed moving to and from a bed to a chair (including a wheelchair)?: A Little Help needed  standing up from a chair using your arms (e.g., wheelchair or bedside chair)?: A Little Help needed to walk in hospital room?: A Little Help needed climbing 3-5 steps with a railing? : A Little 6 Click Score: 18    End of Session Equipment Utilized During Treatment: Gait belt;Back brace Activity Tolerance: Patient tolerated treatment well Patient left: in chair;with call bell/phone within reach Nurse Communication: Mobility status PT Visit Diagnosis: Unsteadiness on feet (R26.81);Other abnormalities of gait and mobility (R26.89);Muscle weakness (generalized) (M62.81);History of falling (Z91.81);Difficulty in walking, not elsewhere classified (R26.2);Pain Pain - part of body:  (Lumbar region)     Time: 1000-1055 PT Time Calculation (min) (ACUTE ONLY): 55 min  Charges:  $Gait Training: 8-22 mins $Therapeutic Exercise: 8-22 mins $Therapeutic Activity: 23-37 mins                     Theresa Snyder, PTA   Theresa Snyder 03/28/2020, 11:34 AM

## 2020-03-28 NOTE — Progress Notes (Signed)
Occupational Therapy Treatment Patient Details Name: Theresa Snyder MRN: [id number] DOB: Dec 18, 1929 Today's Date: 03/28/2020    History of present illness 87 y.o. female with medical history significant for lumbar spinal stenosis,, hypothyroid, hyperlipidemia, presents to the emergency department for chief concerns of life fall and worsening back pain. 03/23/20 Chest CT: Acute nondisplaced fractures of the left anterior second through fourth ribs and right anterior third through fifth ribs. Lumbar MRI: L4 compression fracture with 65% loss of vertebral body height centrally.   OT comments  Ms Mehrer was seen for OT treatment on this date. Upon arrival to room pt reclined in chair requesting to return to bed, agreeable to tx. Pt and daughter given extensive education on safe RW technique, home/routines modifications, adapted dressing techniques, brace donning/doffing, and log rolling techniques. Pt requires MOD A + reacher for LBD seated at [place]. MIN A don/doff brace seated EOB. CGA + RW for toilet t/f including perihygiene. Pt required VCs for sequencing hand washing.   Pt demonstrates poor insight into deficits and intermittently confused t/o - thinks she is at home and required cues to find bed after leaving bathroom. Daughter at bedside reports she is the only caregiver for pt and pt thinks she will have more assistance upon d/c than reality. Pt making good progress toward goals, however continue to recommend STR at discharge. Pt continues to benefit from skilled OT services to maximize return to PLOF and minimize risk of future falls, injury, caregiver burden, and readmission. Will continue to follow POC.    Follow Up Recommendations  SNF    Equipment Recommendations  Other (comment) (2WW)    Recommendations for Other Services      Precautions / Restrictions Precautions Precautions: Fall;Back Precaution Booklet Issued: Yes (comment) Precaution Comments: Log Roll Required Braces or  Orthoses: Spinal Brace Spinal Brace: Thoracolumbosacral orthotic Restrictions Weight Bearing Restrictions: No       Mobility Bed Mobility Overal bed mobility: Needs Assistance Bed Mobility: Rolling;Sidelying to Sit;Sit to Sidelying Rolling: Min assist Sidelying to sit: Min assist     Sit to sidelying: Min assist General bed mobility comments: requires heavy rail use - does not have rails at home  Transfers Overall transfer level: Needs assistance Equipment used: Rolling walker (2 wheeled) Transfers: Sit to/from Stand Sit to Stand: From elevated surface;Min guard Stand pivot transfers: Min assist       General transfer comment: assit for safe RW mngmt    Balance Overall balance assessment: Needs assistance Sitting-balance support: Feet supported Sitting balance-Leahy Scale: Fair     Standing balance support: No upper extremity supported;During functional activity Standing balance-Leahy Scale: Fair                             ADL either performed or assessed with clinical judgement   ADL Overall ADL's : Needs assistance/impaired                                       General ADL Comments: MOD A + reacher for LBD seated at [facility]. MIN A don/doff brace seated EOB. CGA + RW for toilet t/f including perihygiene. Pt required VCs for sequencing hand washing.               Cognition Arousal/Alertness: Awake/alert Behavior During Therapy: WFL for tasks assessed/performed Overall Cognitive Status: Within Functional Limits for tasks assessed  General Comments: Poor insight into deficits and confused t/o - intermittently thinks she is at home and required cues to find bed after leaving bathroom        Exercises Exercises: Other exercises Other Exercises Other Exercises: Pt and family  educated re: OT role, falls prevention, ECS, adapted dressing techniques, log roll, home/routines  modifications Other Exercises: LBD, toileting, UBD, rolling, sit<>stand, sitting/standing balance/tolerance           Pertinent Vitals/ Pain       Pain Assessment: No/denies pain         Frequency  Min 1X/week        Progress Toward Goals  OT Goals(current goals can now be found in the care plan section)  Progress towards OT goals: Progressing toward goals  Acute Rehab OT Goals Patient Stated Goal: To return home OT Goal Formulation: With patient Time For Goal Achievement: 04/09/20 Potential to Achieve Goals: Good ADL Goals Pt Will Perform Grooming: with supervision;with set-up;sitting Pt Will Perform Upper Body Dressing: with min assist;sitting Pt Will Transfer to Toilet: with min assist;ambulating;bedside commode  Plan Discharge plan remains appropriate;Frequency remains appropriate       AM-PAC OT "6 Clicks" Daily Activity     Outcome Measure   Help from another person eating meals?: None Help from another person taking care of personal grooming?: A Little Help from another person toileting, which includes using toliet, bedpan, or urinal?: A Little Help from another person bathing (including washing, rinsing, drying)?: A Little Help from another person to put on and taking off regular upper body clothing?: A Little Help from another person to put on and taking off regular lower body clothing?: A Lot 6 Click Score: 18    End of Session Equipment Utilized During Treatment: Rolling walker;Back brace  OT Visit Diagnosis: Unsteadiness on feet (R26.81);Other abnormalities of gait and mobility (R26.89);Muscle weakness (generalized) (M62.81)   Activity Tolerance Patient tolerated treatment well   Patient Left in bed;with call bell/phone within reach;with bed alarm set;with family/visitor present   Nurse Communication Mobility status        Time: 3154-0086 OT Time Calculation (min): 42 min  Charges: OT General Charges $OT Visit: 1 Visit OT Treatments $Self  Care/Home Management : 38-52 mins  Dessie Coma, M.S. OTR/L  03/28/20, 4:05 PM  ascom [phone]

## 2020-03-28 NOTE — TOC Initial Note (Signed)
Transition of Care [facility]) - Initial/Assessment Note    Patient Details  Name: Theresa Snyder MRN: [id number] Date of Birth: March 19, 1929  Transition of Care [facility]) CM/SW Contact:    Shelbie Ammons, RN Phone Number: 03/28/2020, 3:01 PM  Clinical Narrative:  RNCM met with patient and daughter in room. Patient working with OT but was able to speak briefly with her and more with daughter. Plan is for patient to go home, per daughter she lives at home alone in a single wide mobile home. Daughter verbalizing concerns over her mom being able to care for herself and the amount of assistance she can provide her. Discussed arrangement of home health for which she is agreeable. Further discussed that she might want to reach out to DSS to find out who patient's assigned Medicaid worker is to work on getting Medicaid assistance for in home care, daughter verbalized understanding.  RNCM reached out to [place] with [facility] and he accepted referral for home health.                Expected Discharge Plan: [place] Barriers to Discharge: No Barriers Identified   Patient Goals and CMS Choice        Expected Discharge Plan and Services Expected Discharge Plan: [place]       Living arrangements for the past 2 months: Single Family Home                           HH Arranged: PT,OT,Nurse's Aide,Social Work [employer] Agency: Microbiologist ([place]) Date [place]: 03/28/20 Time [place]: 1500 Representative spoke with at [place] Arrangements/Services Living arrangements for the past 2 months: [place] with:: Self Patient language and need for interpreter reviewed:: Yes Do you feel safe going back to the place where you live?: Yes      Need for Family Participation in Patient Care: Yes (Comment) Care giver support system in place?: Yes (comment)   Criminal Activity/Legal Involvement Pertinent to  Current Situation/Hospitalization: No - Comment as needed  Activities of Daily Living Home Assistive Devices/Equipment: None ADL Screening (condition at time of admission) Patient's cognitive ability adequate to safely complete daily activities?: Yes Is the patient deaf or have difficulty hearing?: No Does the patient have difficulty seeing, even when wearing glasses/contacts?: No Does the patient have difficulty concentrating, remembering, or making decisions?: No Patient able to express need for assistance with ADLs?: Yes Does the patient have difficulty dressing or bathing?: Yes Independently performs ADLs?: No Does the patient have difficulty walking or climbing stairs?: Yes Weakness of Legs: None Weakness of Arms/Hands: None  Permission Sought/Granted                  Emotional Assessment Appearance:: Appears stated age Attitude/Demeanor/Rapport: Engaged Affect (typically observed): Appropriate,Calm Orientation: : Oriented to Self,Oriented to Place,Oriented to  Time,Oriented to Situation Alcohol / Substance Use: Not Applicable Psych Involvement: No (comment)  Admission diagnosis:  Trauma [T14.90XA] Fall [W19.XXXA] Fall, initial encounter [id number]] Closed compression fracture of L4 lumbar vertebra, initial encounter ([place]) [S32.040A] Closed fracture of multiple ribs of both sides, initial encounter [S22.43XA] Lumbar compression fracture ([place]) [S32.000A] Patient Active Problem List   Diagnosis Date Noted   Lumbar compression fracture ([place]) 03/24/2020   Fall 03/23/2020   L4 vertebral fracture ([place]) 03/23/2020   Chronic kidney disease 03/04/2015   Adaptive colitis 03/04/2015  BP (high blood pressure) 05/18/2013   Adult hypothyroidism 05/18/2013   HLD (hyperlipidemia) 05/18/2013   PCP:  [place]:   [id number] [place] [id number]: [phone]:  [phone]    Social Determinants of Health (SDOH) Interventions    Readmission Risk Interventions No flowsheet data found.

## 2020-03-28 NOTE — Progress Notes (Signed)
I saw Theresa Snyder again today. We have discussed over past few days that she has been doing well with LSO and working with PT. Given this and stable imaging, I do think this may be treated conservatively. She is stable for discharge and we will follow up as outpatient for xrays

## 2020-03-29 ENCOUNTER — Encounter: Payer: Self-pay | Admitting: Internal Medicine

## 2020-03-29 DIAGNOSIS — S32040D Wedge compression fracture of fourth lumbar vertebra, subsequent encounter for fracture with routine healing: Secondary | ICD-10-CM

## 2020-03-29 DIAGNOSIS — E782 Mixed hyperlipidemia: Secondary | ICD-10-CM

## 2020-03-29 DIAGNOSIS — S32048A Other fracture of fourth lumbar vertebra, initial encounter for closed fracture: Secondary | ICD-10-CM | POA: Diagnosis not present

## 2020-03-29 DIAGNOSIS — E039 Hypothyroidism, unspecified: Secondary | ICD-10-CM | POA: Diagnosis not present

## 2020-03-29 DIAGNOSIS — I150 Renovascular hypertension: Secondary | ICD-10-CM | POA: Diagnosis not present

## 2020-03-29 LAB — CBC
HCT: 38.9 % (ref 36.0–46.0)
Hemoglobin: 13.3 g/dL (ref 12.0–15.0)
MCH: 32.2 pg (ref 26.0–34.0)
MCHC: 34.2 g/dL (ref 30.0–36.0)
MCV: 94.2 fL (ref 80.0–100.0)
Platelets: 354 10*3/uL (ref 150–400)
RBC: 4.13 MIL/uL (ref 3.87–5.11)
RDW: 12.2 % (ref 11.5–15.5)
WBC: 12.2 10*3/uL — ABNORMAL HIGH (ref 4.0–10.5)
nRBC: 0 % (ref 0.0–0.2)

## 2020-03-29 NOTE — NC FL2 (Signed)
[place] LEVEL OF CARE SCREENING TOOL     IDENTIFICATION  Patient Name: Theresa Snyder Birthdate: 1929-11-10 Sex: female Admission Date (Current Location): 03/23/2020  [place] and [place] Number:  Engineering geologist and Address:  [facility], [address] [place], [place] [address]      Provider Number: [id number]  Attending Physician Name and Address:  Max Sane, MD  Relative Name and Phone Number:  [name] [phone]    Current Level of Care: Hospital Recommended Level of Care: [place] Prior Approval Number:    Date Approved/Denied:   PASRR Number: [id number]  Discharge Plan: SNF    Current Diagnoses: Patient Active Problem List   Diagnosis Date Noted  . Lumbar compression fracture ([place]) 03/24/2020  . Fall 03/23/2020  . L4 vertebral fracture ([place]) 03/23/2020  . Chronic kidney disease 03/04/2015  . Adaptive colitis 03/04/2015  . BP (high blood pressure) 05/18/2013  . Adult hypothyroidism 05/18/2013  . HLD (hyperlipidemia) 05/18/2013    Orientation RESPIRATION BLADDER Height & Weight     Self,Time,Situation,Place  Normal Continent Weight: 68 kg Height:  5' (152.4 cm)  BEHAVIORAL SYMPTOMS/MOOD NEUROLOGICAL BOWEL NUTRITION STATUS      Continent Diet (Heart Healthy)  AMBULATORY STATUS COMMUNICATION OF NEEDS Skin   Limited Assist Verbally Normal                       Personal Care Assistance Level of Assistance  Bathing,Feeding,Dressing Bathing Assistance: Limited assistance Feeding assistance: Independent Dressing Assistance: Limited assistance     Functional Limitations Info  Sight,Hearing,Speech Sight Info: Adequate Hearing Info: Adequate Speech Info: Adequate    SPECIAL CARE FACTORS FREQUENCY  PT (By licensed PT),OT (By licensed OT)                    Contractures Contractures Info: Not present    Additional Factors Info  Code Status,Allergies Code Status  Info: Full Allergies Info: Iodinated Diagnostic Agents, Lorazepam, Risperidone, Sulfmethoxazole-trimethoprim           Current Medications (03/29/2020):  This is the current hospital active medication list Current Facility-Administered Medications  Medication Dose Route Frequency Provider Last Rate Last Admin  . amLODipine (NORVASC) tablet 10 mg  10 mg Oral Q breakfast Cox, Amy N, DO   10 mg at 03/29/20 0905  . benazepril (LOTENSIN) tablet 20 mg  20 mg Oral Daily Cox, Amy N, DO   20 mg at 03/29/20 0905  . chlorproMAZINE (THORAZINE) 12.5 mg in sodium chloride 0.9 % 25 mL IVPB  12.5 mg Intravenous Q6H PRN Sharion Settler, NP   Stopped at 03/25/20 0132  . docusate sodium (COLACE) capsule 100 mg  100 mg Oral BID Donne Hazel, MD   100 mg at 03/29/20 8366  . enoxaparin (LOVENOX) injection 30 mg  30 mg Subcutaneous Q24H Dallie Piles, RPH   30 mg at 03/28/20 1933  . gabapentin (NEURONTIN) capsule 300 mg  300 mg Oral Daily Cox, Amy N, DO   300 mg at 03/29/20 0905  . hydrochlorothiazide (HYDRODIURIL) tablet 25 mg  25 mg Oral Daily Cox, Amy N, DO   25 mg at 03/29/20 0905  . levothyroxine (SYNTHROID) tablet 88 mcg  88 mcg Oral QAC breakfast Cox, Amy N, DO   88 mcg at 03/29/20 0526  . metoprolol tartrate (LOPRESSOR) injection 5 mg  5 mg Intravenous Q4H PRN Cox, Amy N, DO      . ondansetron (  ZOFRAN) tablet 4 mg  4 mg Oral Q6H PRN Cox, Amy N, DO       Or  . ondansetron (ZOFRAN) injection 4 mg  4 mg Intravenous Q6H PRN Cox, Amy N, DO   4 mg at 03/24/20 2109  . oxyCODONE-acetaminophen (PERCOCET/ROXICET) 5-325 MG per tablet 1 tablet  1 tablet Oral Q4H PRN Donne Hazel, MD   1 tablet at 03/29/20 0905  . polyethylene glycol (MIRALAX / GLYCOLAX) packet 17 g  17 g Oral Daily Donne Hazel, MD   17 g at 03/29/20 0906  . pravastatin (PRAVACHOL) tablet 20 mg  20 mg Oral QHS Cox, Amy N, DO   20 mg at 03/28/20 1934  . simethicone (MYLICON) 40 RU/0.4VW suspension 80 mg  80 mg Oral QID PRN Sharion Settler,  NP   80 mg at 03/28/20 1004     Discharge Medications: Please see discharge summary for a list of discharge medications.  Relevant Imaging Results:  Relevant Lab Results:   Additional Information SS# [id number]  Shelbie Ammons, RN

## 2020-03-29 NOTE — Progress Notes (Signed)
Physical Therapy Treatment Patient Details Name: Theresa Snyder MRN: [id number] DOB: 1929-08-03 Today's Date: 03/29/2020    History of Present Illness 87 y.o. female with medical history significant for lumbar spinal stenosis,, hypothyroid, hyperlipidemia, presents to the emergency department for chief concerns of life fall and worsening back pain. 03/23/20 Chest CT: Acute nondisplaced fractures of the left anterior second through fourth ribs and right anterior third through fifth ribs. Lumbar MRI: L4 compression fracture with 65% loss of vertebral body height centrally.    PT Comments    Pt was side lying in bed upon arriving. She agrees to session and is cooperative throughout. Pt very appropriate throughout most of session but did have short episodes of confusion thinking she was at home. She required min assist to exit bed and mod assist to return. Did have LSO brace donned throughout session. Pt limited by fatigue during ambulation only 50 ft. Min assist towards end of gait training due to fatigue. Therapist highly recommend DC to SNF due to pt living alone without 24/7 assistance.  She will benefit from SNF at DC to address deficits while improving independence with ADL.    Follow Up Recommendations  SNF;Supervision/Assistance - 24 hour;Supervision for mobility/OOB     Equipment Recommendations  None recommended by PT    Recommendations for Other Services       Precautions / Restrictions Precautions Precautions: Fall;Back Precaution Booklet Issued: Yes (comment) Precaution Comments: Log Roll Required Braces or Orthoses: Spinal Brace Spinal Brace: Other (comment) (LSO- quickdraw) Restrictions Weight Bearing Restrictions: No    Mobility  Bed Mobility Overal bed mobility: Needs Assistance Bed Mobility: Rolling;Sidelying to Sit;Supine to Sit;Sit to Sidelying;Sit to Supine Rolling: Min assist Sidelying to sit: Min assist (heavy use of bed rails) Supine to sit: Min assist Sit  to supine: Mod assist Sit to sidelying: Mod assist General bed mobility comments: Pt was able to exit bed with min assist + heavy use of bedrails. mod assist for returning to supine after OOB activity    Transfers Overall transfer level: Needs assistance Equipment used: Rolling walker (2 wheeled) Transfers: Sit to/from Stand Sit to Stand: From elevated surface;Min assist         General transfer comment: Min assist for safety to STS from slightly elevated bed height  Ambulation/Gait Ambulation/Gait assistance: Min guard;Min assist Gait Distance (Feet): 50 Feet Assistive device: Rolling walker (2 wheeled) Gait Pattern/deviations: WFL(Within Functional Limits);Antalgic Gait velocity: decreased   General Gait Details: Pt was able to ambulate 50 ft however does have noted SOB towards last 20 ft. min assist once fatigued. Vcs throughout for staying inside RW and increasing BOS       Balance Overall balance assessment: Needs assistance Sitting-balance support: Feet supported Sitting balance-Leahy Scale: Fair     Standing balance support: No upper extremity supported;During functional activity Standing balance-Leahy Scale: Fair         Cognition Arousal/Alertness: Awake/alert Behavior During Therapy: WFL for tasks assessed/performed Overall Cognitive Status: Within Functional Limits for tasks assessed    General Comments: Poor insight into deficits and confused t/o - intermittently thinks she is at home and required cues to find bed after leaving bathroom         General Comments General comments (skin integrity, edema, etc.): Lengthy discussion about DC disposition and recommendations. PT lives alone and family unable to assist 24/7. ZHighly recommend DC to SNF for safety      Pertinent Vitals/Pain Pain Assessment: 0-10 Pain Score: 2  Pain Location: back/chest  Pain Descriptors / Indicators: Sore Pain Intervention(s): Limited activity within patient's  tolerance;Monitored during session;Premedicated before session;Repositioned           PT Goals (current goals can now be found in the care plan section) Acute Rehab PT Goals Patient Stated Goal: rehab then home Progress towards PT goals: Progressing toward goals    Frequency    7X/week      PT Plan Discharge plan needs to be updated       AM-PAC PT "6 Clicks" Mobility   Outcome Measure  Help needed turning from your back to your side while in a flat bed without using bedrails?: A Little Help needed moving from lying on your back to sitting on the side of a flat bed without using bedrails?: A Little Help needed moving to and from a bed to a chair (including a wheelchair)?: A Little Help needed standing up from a chair using your arms (e.g., wheelchair or bedside chair)?: A Little Help needed to walk in hospital room?: A Little Help needed climbing 3-5 steps with a railing? : A Little 6 Click Score: 18    End of Session Equipment Utilized During Treatment: Gait belt;Back brace Activity Tolerance: Patient tolerated treatment well;Patient limited by fatigue Patient left: in bed;with call bell/phone within reach;with bed alarm set Nurse Communication: Mobility status PT Visit Diagnosis: Unsteadiness on feet (R26.81);Other abnormalities of gait and mobility (R26.89);Muscle weakness (generalized) (M62.81);History of falling (Z91.81);Difficulty in walking, not elsewhere classified (R26.2);Pain     Time: 1448-1856 PT Time Calculation (min) (ACUTE ONLY): 20 min  Charges:  $Gait Training: 8-22 mins                     Julaine Fusi PTA 03/29/20, 10:27 AM

## 2020-03-29 NOTE — Care Management Important Message (Signed)
Important Message  Patient Details  Name: Theresa Snyder MRN: [id number] Date of Birth: Oct 06, 1929   Medicare Important Message Given:  Yes     Theresa Snyder 03/29/2020, 9:42 AM

## 2020-03-29 NOTE — TOC Progression Note (Signed)
Transition of Care [facility]) - Progression Note    Patient Details  Name: Theresa Snyder MRN: [id number] Date of Birth: 06/21/1929  Transition of Care [facility]) CM/SW [place], RN Phone Number: 03/29/2020, 10:18 AM  Clinical Narrative:   RNCM received communication from attending MD with request to call patient's son Detra Bores at [phone] to discuss discharge planning as family now feels patient needs SNF prior to going home. RNCM reached out to son and discussion was had. Cletus Gash reports that patient will not have the assistance she needs in the home as he and his sister work. He feels patient will need SNF and is agreeable to a search of all local facilities although he would prefer she not go to [facility]. Discussed with Cletus Gash that patient will have to agree with SNF discharge.  RNCM verified PASSR, completed FL2 and started bed search.     Expected Discharge Plan: [place] Barriers to Discharge: No Barriers Identified  Expected Discharge Plan and Services Expected Discharge Plan: [place] arrangements for the past 2 months: Single Family Home Expected Discharge Date: 03/29/20                         [facility] Arranged: PT,OT,Nurse's Aide,Social Work [place] Agency: [place] ([place]) Date [place]: 03/28/20 Time [place]: 1500 Representative spoke with at [place]: [facility]   Social Determinants of Health ([place]) Interventions    Readmission Risk Interventions No flowsheet data found.

## 2020-03-29 NOTE — TOC Progression Note (Signed)
Transition of Care [facility]) - Progression Note    Patient Details  Name: Theresa Snyder MRN: [id number] Date of Birth: 12-11-1929  Transition of Care [facility]) CM/SW [place], RN Phone Number: 03/29/2020, 3:14 PM  Clinical Narrative:  RNCM received call back from patient's son Cletus Gash, they have chosen bed at [facility]. RNCM reached out to facility and they will be able to accept patient tomorrow. Covid test ordered. Patient will be going to [facility]     Expected Discharge Plan: [place] Barriers to Discharge: No Barriers Identified  Expected Discharge Plan and Services Expected Discharge Plan: [place] arrangements for the past 2 months: Single Family Home Expected Discharge Date: 03/29/20                         [facility] Arranged: PT,OT,Nurse's Aide,Social Work [employer] Agency: [place] ([place]) Date [place]: 03/28/20 Time [place]: 1500 Representative spoke with at [place]: [facility]   Social Determinants of Health ([place]) Interventions    Readmission Risk Interventions No flowsheet data found.

## 2020-03-29 NOTE — TOC Progression Note (Signed)
Transition of Care [facility]) - Progression Note    Patient Details  Name: Theresa Snyder MRN: [id number] Date of Birth: 31-Oct-1929  Transition of Care [facility]) CM/SW [place], RN Phone Number: 03/29/2020, 2:19 PM  Clinical Narrative:   RNCM reached out to Cletus Gash patient's son to present bed offers of [facility], [facility] and [facility]. Cletus Gash verbalizes that he will reach out to his sister and his mother and they will decide on which facility they want to choose.     Expected Discharge Plan: [place] Barriers to Discharge: No Barriers Identified  Expected Discharge Plan and Services Expected Discharge Plan: [place] arrangements for the past 2 months: Single Family Home Expected Discharge Date: 03/29/20                         [facility] Arranged: PT,OT,Nurse's Aide,Social Work [place] Agency: [place] ([place]) Date [place]: 03/28/20 Time [place]: 1500 Representative spoke with at [place]: [facility]   Social Determinants of Health ([place]) Interventions    Readmission Risk Interventions No flowsheet data found.

## 2020-03-29 NOTE — Progress Notes (Signed)
PROGRESS NOTE    Theresa Snyder  QIO:[id number] DOB: April 20, 1929 DOA: 03/23/2020 PCP: [facility]    Brief Narrative:  87 y.o. female with medical history significant for lumbar spinal stenosis,, hypothyroid, hyperlipidemia, presents to the emergency department for chief concerns of life fall and worsening back pain.  She walked to mail box and fell face first and hit her chest 03/22/20. She then got up and walked back to her car and drove up her driveway to her house. She normally uses the rollator walker to ambulate however she didn't use her walker to go to the mail box yesterday when she fell.   She went to her car and took it easy yesterday and slept on her couch last night. She took a hydrocodon 10 mg once and went to bed. She denies feeling numbness in her legs or gluteal.   She presents to the ED because of chest pain and shortness of breath with inhalation.   Assessment & Plan:   Principal Problem:   L4 vertebral fracture (HCC) Active Problems:   Chronic kidney disease   BP (high blood pressure)   Adult hypothyroidism   HLD (hyperlipidemia)   Fall   Lumbar compression fracture (HCC)    Acute L4 compression fracture-present on admission secondary to mechanical fall -With mild retrial portion involving the posterior superior aspect of the vertebral body with 30% canal compromise and read by radiologist as fractures likely unstable with bilateral pedicle fractures. -Her pain got worse today but it seems like her brace was not comfortable -Upright xray reviewed. Findings of L4 compression fracture with 65% loss of vertebral body height. -Neurosurgery -> no plan for surgery for now.  Outpatient follow-up at discharge  Hypertension -Continue amlodipine 10 mg daily, benazepril 20 mg p.o. daily, hydrochlorothiazide 25 mg p.o. daily -BP stable and controlled at this time  Hyperlipidemia -continue with pravastatin 20 mg p.o. daily at  bedtime  Hypothyroid -continue home levothyroxine 88 mcg -Most recent TSH from 8/19 noted to be 1.576  Neuropathy -Continue with gabapentin 300 mg p.o. daily resumed  CKD 3b -Cr currently 1.39 - Cont to follow bmet trends  Constipation -Resolved  DVT prophylaxis: Lovenox subq Code Status: Full Family Communication: Updated patient's daughter and son over phone  on 2/18.  Now the both would like for her to be placed to SNF  Status is: Inpt  Requires continued inpt status because: Ongoing active pain requiring inpatient pain management and Inpatient level of care appropriate due to severity of illness  Dispo: The patient is from: Home              Anticipated d/c is to: SNF               Anticipated d/c date is: 1 day              Patient currently is not medically stable to d/c.  Patient was planned discharge this morning to home with home health then patient's family declined stating not a safe discharge as they do not have support for her at home.  They are requesting SNF placement now.   Difficult to place patient No   Consultants:   Neurosurgery  Subjective: Has her brace on and seems uncomfortable.  Requesting not to discharge her today.  Debating now to go to SNF his family requesting same.  Objective: Vitals:   03/29/20 0016 03/29/20 0506 03/29/20 1107 03/29/20 1109  BP: (!) 169/79 (!) 142/94 118/64 96/64  Pulse: 92 84 (!) 110 94  Resp: 15 15 16 16   Temp: 98 F (36.7 C) 97.9 F (36.6 C) 98.3 F (36.8 C) 97.6 F (36.4 C)  TempSrc:   Oral Oral  SpO2: 91% 99% 94% 97%  Weight:      Height:       No intake or output data in the 24 hours ending 03/29/20 1437 Filed Weights   03/23/20 0952 03/23/20 1112  Weight: 68 kg 68 kg    Examination: General exam: Conversant, in no acute distress Respiratory system: normal chest rise, clear, no audible wheezing Cardiovascular system: regular rhythm, s1-s2 Gastrointestinal system: Nondistended, nontender, pos  BS Central nervous system: No seizures, no tremors Extremities: No cyanosis, no joint deformities Skin: No rashes, no pallor Psychiatry: Affect normal // no auditory hallucinations   Data Reviewed: I have personally reviewed following labs and imaging studies  CBC: Recent Labs  Lab 03/23/20 1005 03/24/20 0456 03/26/20 0459 03/29/20 0504  WBC 10.2 15.4* 13.7* 12.2*  NEUTROABS 7.5  --   --   --   HGB 13.1 12.5 12.2 13.3  HCT 37.9 36.8 35.5* 38.9  MCV 94.5 93.9 95.2 94.2  PLT 286 299 292 737   Basic Metabolic Panel: Recent Labs  Lab 03/23/20 1005 03/24/20 0456 03/25/20 0612 03/26/20 0459  NA 138 137 136 135  K 4.2 4.6 4.2 4.2  CL 102 104 104 104  CO2 23 24 23 24   GLUCOSE 116* 137* 115* 114*  BUN 27* 20 17 20   CREATININE 1.53* 1.23* 1.32* 1.39*  CALCIUM 9.3 8.7* 8.5* 8.8*   GFR: Estimated Creatinine Clearance: 24.1 mL/min (A) (by C-G formula based on SCr of 1.39 mg/dL (H)). Liver Function Tests: Recent Labs  Lab 03/25/20 0612 03/26/20 0459  AST 18 18  ALT 11 10  ALKPHOS 74 78  BILITOT 0.3 0.6  PROT 6.2* 6.2*  ALBUMIN 3.2* 3.1*    Recent Results (from the past 240 hour(s))  SARS CORONAVIRUS 2 (TAT 6-24 HRS) Nasopharyngeal Nasopharyngeal Swab     Status: None   Collection Time: 03/23/20  2:01 PM   Specimen: Nasopharyngeal Swab  Result Value Ref Range Status   SARS Coronavirus 2 NEGATIVE NEGATIVE Final    Comment: (NOTE) SARS-CoV-2 target nucleic acids are NOT DETECTED.  The SARS-CoV-2 RNA is generally detectable in upper and lower respiratory specimens during the acute phase of infection. Negative results do not preclude SARS-CoV-2 infection, do not rule out co-infections with other pathogens, and should not be used as the sole basis for treatment or other patient management decisions. Negative results must be combined with clinical observations, patient history, and epidemiological information. The expected result is Negative.  Fact Sheet for  Patients: SugarRoll.be  Fact Sheet for Healthcare Providers: https://www.woods-mathews.com/  This test is not yet approved or cleared by the [place] FDA and  has been authorized for detection and/or diagnosis of SARS-CoV-2 by FDA under an Emergency Use Authorization (EUA). This EUA will remain  in effect (meaning this test can be used) for the duration of the COVID-19 declaration under Se ction 564(b)(1) of the Act, 21 U.S.C. section 360bbb-3(b)(1), unless the authorization is terminated or revoked sooner.  Performed at [facility], [address], [place], [place] [address]      Radiology Studies: No results found.  Scheduled Meds: . amLODipine  10 mg Oral Q breakfast  . benazepril  20 mg Oral Daily  . docusate sodium  100 mg Oral BID  .  enoxaparin (LOVENOX) injection  30 mg Subcutaneous Q24H  . gabapentin  300 mg Oral Daily  . hydrochlorothiazide  25 mg Oral Daily  . levothyroxine  88 mcg Oral QAC breakfast  . polyethylene glycol  17 g Oral Daily  . pravastatin  20 mg Oral QHS   Continuous Infusions: . chlorproMAZINE (THORAZINE) IV Stopped (03/25/20 0132)     LOS: 5 days   Max Sane, MD [facility] Pager On Amion  If 7PM-7AM, please contact night-coverage 03/29/2020, 2:37 PM

## 2020-03-30 DIAGNOSIS — E039 Hypothyroidism, unspecified: Secondary | ICD-10-CM | POA: Diagnosis not present

## 2020-03-30 DIAGNOSIS — E782 Mixed hyperlipidemia: Secondary | ICD-10-CM | POA: Diagnosis not present

## 2020-03-30 DIAGNOSIS — S32048A Other fracture of fourth lumbar vertebra, initial encounter for closed fracture: Secondary | ICD-10-CM | POA: Diagnosis not present

## 2020-03-30 DIAGNOSIS — I150 Renovascular hypertension: Secondary | ICD-10-CM | POA: Diagnosis not present

## 2020-03-30 LAB — SARS CORONAVIRUS 2 (TAT 6-24 HRS): SARS Coronavirus 2: NEGATIVE

## 2020-03-30 MED ORDER — HYDROCODONE-ACETAMINOPHEN 10-325 MG PO TABS: 1.0000 | ORAL_TABLET | Freq: Four times a day (QID) | ORAL | 0 refills | Status: AC | PRN

## 2020-03-30 MED ORDER — ALUM & MAG HYDROXIDE-SIMETH 200-200-20 MG/5ML PO SUSP
15.0000 mL | Freq: Four times a day (QID) | ORAL | Status: DC | PRN
  Administered 2020-03-30: 15 mL via ORAL
  Filled 2020-03-30: qty 30

## 2020-03-30 NOTE — Progress Notes (Signed)
Patient discharging to [facility]. Report to Angie at facility.

## 2020-03-30 NOTE — Plan of Care (Signed)
  Problem: Activity: Goal: Risk for activity intolerance will decrease Outcome: Progressing   Problem: Pain Managment: Goal: General experience of comfort will improve Outcome: Progressing   Problem: Safety: Goal: Ability to remain free from injury will improve Outcome: Progressing   

## 2020-03-30 NOTE — TOC Transition Note (Addendum)
Transition of Care [facility]) - CM/SW Discharge Note   Patient Details  Name: Theresa Snyder MRN: [id number] Date of Birth: August 21, 1929  Transition of Care [facility]) CM/SW Contact:  Izola Price, RN Phone Number: 03/30/2020, 10:17 AM   Clinical Narrative:    Patient will be transferred to [facility]. Facility contacted [facility]) to confirm. Cleared for admission today. Discharge summary Faxed to [phone] and [phone] per Kenney Houseman [facility] Covid (-) 2/18. Facesheet and Med. Nec.  printed to Unit. Communicated with Unit RN. No special equipment needed for transport per unit RN. So, Reece Levy, notified of today's transfer. [facility] will be called for transport when patient is ready. Simmie Davies RN CM  1055 [facility] to arrive around noon. [phone] number to call report to unit/Room 10A per [facility] DON. Simmie Davies RN CM       Final next level of care: Skilled Nursing Facility Barriers to Discharge: Barriers Resolved   Patient Goals and CMS Choice   CMS Medicare.gov Compare Post Acute Care list provided to:: Other (Comment Required) (CM SW discussed choices with patient and son, and choice was [facility]) Choice offered to / list presented to : Patient,Sibling (Per CM SW notes.)  Discharge Placement PASRR number recieved:  (From [facility])            Patient chooses bed at: [facility] (Per CM SW notes and son, Illene Silver) Patient to be transferred to facility by: [facility] Name of family member notified: Maxine Fredman Patient and family notified of of transfer: 03/30/20  Discharge Plan and Services                DME Arranged: N/A DME Agency: NA       HH Arranged: PT,OT,Nurse's Aide,Social Work [employer] Agency: NA Date [place]: 03/28/20 Time HH Agency Contacted: 1500 Representative spoke with at [place]: Advance  Social Determinants of Health ([place]) Interventions     Readmission Risk Interventions No flowsheet data found.

## 2020-03-30 NOTE — Discharge Summary (Signed)
[place] [facility] [place] NAME: Theresa Snyder    MR#:  [id number]  DATE OF BIRTH:  Jul 14, 1929  DATE OF ADMISSION:  03/23/2020   ADMITTING PHYSICIAN: Donne Hazel, MD  DATE OF DISCHARGE: 03/30/2020  PRIMARY CARE PHYSICIAN: [facility] DIAGNOSIS:  Trauma [T14.90XA] Fall [W19.XXXA] Fall, initial encounter [id number]] Closed compression fracture of L4 lumbar vertebra, initial encounter ([place]) [S32.040A] Closed fracture of multiple ribs of both sides, initial encounter [S22.43XA] Lumbar compression fracture ([place]) [S32.000A] DISCHARGE DIAGNOSIS:  Principal Problem:   L4 vertebral fracture (HCC) Active Problems:   Chronic kidney disease   BP (high blood pressure)   Adult hypothyroidism   HLD (hyperlipidemia)   Fall   Lumbar compression fracture ([place])  SECONDARY DIAGNOSIS:   Past Medical History:  Diagnosis Date  . Arthritis   . Cancer ([place])    skin  . Chronic kidney disease    history of renal insufficiency  . History of hiatal hernia   . Hypercholesteremia   . Hypertension   . Hypothyroidism   . Lymphedema of leg   . Pneumonia    in the past  . Seizures ([place]) 2014   no seizures since then  . Spinal stenosis    HOSPITAL COURSE:  87 y.o.femalewith medical history significant forlumbar spinal stenosis,, hypothyroid, hyperlipidemia admitted status post fall and found to have acute L4 vertebral compression fracture  Acute L4 compression fracture-present on admission secondary to mechanical fall -Pain is well controlled.  Keep LSO brace in place. -Neurosurgery -> no plan for surgery for now.  Outpatient neurosurgery follow-up at discharge  Hypertension -Controlled  Hyperlipidemia -continue with pravastatin 20 mg p.o. daily at bedtime  Hypothyroid -continue home levothyroxine 88 mcg -Most recent TSH from 8/19 noted to be 1.576  Neuropathy -Continue with gabapentin 300 mg p.o. daily  resumed  CKD 3b -Stable  Constipation -Resolved   DISCHARGE CONDITIONS:  Stable CONSULTS OBTAINED:   DRUG ALLERGIES:   Allergies  Allergen Reactions  . Iodinated Diagnostic Agents Other (See Comments)    Blue, Green and Red dyes   . Lorazepam     Altered mental state  . Risperidone Other (See Comments)    Altered mental state   . Sulfamethoxazole-Trimethoprim     Hyper   DISCHARGE MEDICATIONS:   Allergies as of 03/30/2020      Reactions   Iodinated Diagnostic Agents Other (See Comments)   Blue, Green and Red dyes    Lorazepam    Altered mental state   Risperidone Other (See Comments)   Altered mental state    Sulfamethoxazole-trimethoprim    Hyper      Medication List    STOP taking these medications   cholecalciferol 1000 units tablet Commonly known as: VITAMIN D     TAKE these medications   amLODipine 10 MG tablet Commonly known as: NORVASC Take 10 mg by mouth daily with breakfast. for high blood pressure   benazepril 20 MG tablet Commonly known as: LOTENSIN Take 20 mg by mouth daily.   gabapentin 300 MG capsule Commonly known as: NEURONTIN Take 300 mg by mouth daily.   hydrochlorothiazide 25 MG tablet Commonly known as: HYDRODIURIL Take 25 mg by mouth daily.   HYDROcodone-acetaminophen 10-325 MG tablet Commonly known as: NORCO Take 1 tablet by mouth every 6 (six) hours as needed for up to 3 days. Max 2 tablets per day What changed: reasons to take  this   levothyroxine 88 MCG tablet Commonly known as: SYNTHROID Take 88 mcg by mouth daily before breakfast.   meclizine 25 MG tablet Commonly known as: ANTIVERT Take 12.5-25 mg by mouth 3 (three) times daily as needed for dizziness or nausea.   pravastatin 20 MG tablet Commonly known as: PRAVACHOL Take 20 mg by mouth at bedtime.      DISCHARGE INSTRUCTIONS:   DIET:  Regular diet DISCHARGE CONDITION:  Good ACTIVITY:  Activity as tolerated OXYGEN:  Home Oxygen: No.  Oxygen  Delivery: room air DISCHARGE LOCATION:  nursing home/[facility] with palliative care to follow while at the facility  If you experience worsening of your admission symptoms, develop shortness of breath, life threatening emergency, suicidal or homicidal thoughts you must seek medical attention immediately by calling 911 or calling your MD immediately  if symptoms less severe.  You Must read complete instructions/literature along with all the possible adverse reactions/side effects for all the Medicines you take and that have been prescribed to you. Take any new Medicines after you have completely understood and accpet all the possible adverse reactions/side effects.   Please note  You were cared for by a hospitalist during your hospital stay. If you have any questions about your discharge medications or the care you received while you were in the hospital after you are discharged, you can call the unit and asked to speak with the hospitalist on call if the hospitalist that took care of you is not available. Once you are discharged, your primary care physician will handle any further medical issues. Please note that NO REFILLS for any discharge medications will be authorized once you are discharged, as it is imperative that you return to your primary care physician (or establish a relationship with a primary care physician if you do not have one) for your aftercare needs so that they can reassess your need for medications and monitor your lab values.    On the day of Discharge:  VITAL SIGNS:  Blood pressure 129/69, pulse 68, temperature 98 F (36.7 C), temperature source Oral, resp. rate 18, height 5' (1.524 m), weight 68 kg, SpO2 97 %. PHYSICAL EXAMINATION:  GENERAL:  87 y.o.-year-old patient lying in the bed with no acute distress.  EYES: Pupils equal, round, reactive to light and accommodation. No scleral icterus. Extraocular muscles intact.  HEENT: Head atraumatic, normocephalic.  Oropharynx and nasopharynx clear.  NECK:  Supple, no jugular venous distention. No thyroid enlargement, no tenderness.  LUNGS: Normal breath sounds bilaterally, no wheezing, rales,rhonchi or crepitation. No use of accessory muscles of respiration.  CARDIOVASCULAR: S1, S2 normal. No murmurs, rubs, or gallops.  ABDOMEN: Soft, non-tender, non-distended. Bowel sounds present. No organomegaly or mass.  EXTREMITIES: No pedal edema, cyanosis, or clubbing.  NEUROLOGIC: Cranial nerves II through XII are intact. Muscle strength 5/5 in all extremities. Sensation intact. Gait not checked.  PSYCHIATRIC: The patient is alert and oriented x 3.  SKIN: No obvious rash, lesion, or ulcer.  DATA REVIEW:   CBC Recent Labs  Lab 03/29/20 0504  WBC 12.2*  HGB 13.3  HCT 38.9  PLT 354    Chemistries  Recent Labs  Lab 03/26/20 0459  NA 135  K 4.2  CL 104  CO2 24  GLUCOSE 114*  BUN 20  CREATININE 1.39*  CALCIUM 8.8*  AST 18  ALT 10  ALKPHOS 78  BILITOT 0.6     Outpatient follow-up  Contact information for follow-up providers    [facility]. Go on 04/11/2020.   Specialty: General Practice Why: 1:00 pm Contact information: [place]. [place] [address] [phone]        Deetta Perla, MD. Go on 04/23/2020.   Specialty: Neurosurgery Why: 1:45 pm Contact information: [place] [address] [phone]            Contact information for after-discharge care    [facility] .   Service: Skilled Nursing Contact information: [place] [phone]                  30 Day Unplanned Readmission Risk Score   Flowsheet Row ED to Hosp-Admission (Current) from 03/23/2020 in [place] (1A)  30 Day Unplanned Readmission Risk Score (%) 12.18 Filed at 03/30/2020 0800     This score is the patient's  risk of an unplanned readmission within 30 days of being discharged (0 -100%). The score is based on dignosis, age, lab data, medications, orders, and past utilization.   Low:  0-14.9   Medium: 15-21.9   High: 22-29.9   Extreme: 30 and above         Management plans discussed with the patient, family and they are in agreement.  CODE STATUS: Full Code   TOTAL TIME TAKING CARE OF THIS PATIENT: 45 minutes.    Max Sane M.D on 03/30/2020 at 8:35 AM  [facility]   CC: Primary care physician; [facility]   Note: This dictation was prepared with Diplomatic Services operational officer dictation along with smaller phrase technology. Any transcriptional errors that result from this process are unintentional.

## 2020-03-30 NOTE — Discharge Instructions (Signed)
Fall Prevention in the Home, Adult Falls can cause injuries and can happen to people of all ages. There are many things you can do to make your home safe and to help prevent falls. Ask for help when making these changes. What actions can I take to prevent falls? General Instructions  Use good lighting in all rooms. Replace any light bulbs that burn out.  Turn on the lights in dark areas. Use night-lights.  Keep items that you use often in easy-to-reach places. Lower the shelves around your home if needed.  Set up your furniture so you have a clear path. Avoid moving your furniture around.  Do not have throw rugs or other things on the floor that can make you trip.  Avoid walking on wet floors.  If any of your floors are uneven, fix them.  Add color or contrast paint or tape to clearly mark and help you see: ? Grab bars or handrails. ? First and last steps of staircases. ? Where the edge of each step is.  If you use a stepladder: ? Make sure that it is fully opened. Do not climb a closed stepladder. ? Make sure the sides of the stepladder are locked in place. ? Ask someone to hold the stepladder while you use it.  Know where your pets are when moving through your home. What can I do in the bathroom?  Keep the floor dry. Clean up any water on the floor right away.  Remove soap buildup in the tub or shower.  Use nonskid mats or decals on the floor of the tub or shower.  Attach bath mats securely with double-sided, nonslip rug tape.  If you need to sit down in the shower, use a plastic, nonslip stool.  Install grab bars by the toilet and in the tub and shower. Do not use towel bars as grab bars.      What can I do in the bedroom?  Make sure that you have a light by your bed that is easy to reach.  Do not use any sheets or blankets for your bed that hang to the floor.  Have a firm chair with side arms that you can use for support when you get dressed. What can I do  in the kitchen?  Clean up any spills right away.  If you need to reach something above you, use a step stool with a grab bar.  Keep electrical cords out of the way.  Do not use floor polish or wax that makes floors slippery. What can I do with my stairs?  Do not leave any items on the stairs.  Make sure that you have a light switch at the top and the bottom of the stairs.  Make sure that there are handrails on both sides of the stairs. Fix handrails that are broken or loose.  Install nonslip stair treads on all your stairs.  Avoid having throw rugs at the top or bottom of the stairs.  Choose a carpet that does not hide the edge of the steps on the stairs.  Check carpeting to make sure that it is firmly attached to the stairs. Fix carpet that is loose or worn. What can I do on the outside of my home?  Use bright outdoor lighting.  Fix the edges of walkways and driveways and fix any cracks.  Remove anything that might make you trip as you walk through a door, such as a raised step or threshold.  Trim  any bushes or trees on paths to your home.  Check to see if handrails are loose or broken and that both sides of all steps have handrails.  Install guardrails along the edges of any raised decks and porches.  Clear paths of anything that can make you trip, such as tools or rocks.  Have leaves, snow, or ice cleared regularly.  Use sand or salt on paths during winter.  Clean up any spills in your garage right away. This includes grease or oil spills. What other actions can I take?  Wear shoes that: ? Have a low heel. Do not wear high heels. ? Have rubber bottoms. ? Feel good on your feet and fit well. ? Are closed at the toe. Do not wear open-toe sandals.  Use tools that help you move around if needed. These include: ? Canes. ? Walkers. ? Scooters. ? Crutches.  Review your medicines with your doctor. Some medicines can make you feel dizzy. This can increase your  chance of falling. Ask your doctor what else you can do to help prevent falls. Where to find more information  Centers for Disease Control and Prevention, [employer]: http://www.wolf.info/  National Institute on Aging: http://kim-miller.com/ Contact a doctor if:  You are afraid of falling at home.  You feel weak, drowsy, or dizzy at home.  You fall at home. Summary  There are many simple things that you can do to make your home safe and to help prevent falls.  Ways to make your home safe include removing things that can make you trip and installing grab bars in the bathroom.  Ask for help when making these changes in your home. This information is not intended to replace advice given to you by your health care provider. Make sure you discuss any questions you have with your health care provider. Document Revised: 08/30/2019 Document Reviewed: 08/30/2019 Elsevier Patient Education  2021 [place] Prevention in the Home, Adult Falls can cause injuries and can happen to people of all ages. There are many things you can do to make your home safe and to help prevent falls. Ask for help when making these changes. What actions can I take to prevent falls? General Instructions  Use good lighting in all rooms. Replace any light bulbs that burn out.  Turn on the lights in dark areas. Use night-lights.  Keep items that you use often in easy-to-reach places. Lower the shelves around your home if needed.  Set up your furniture so you have a clear path. Avoid moving your furniture around.  Do not have throw rugs or other things on the floor that can make you trip.  Avoid walking on wet floors.  If any of your floors are uneven, fix them.  Add color or contrast paint or tape to clearly mark and help you see: ? Grab bars or handrails. ? First and last steps of staircases. ? Where the edge of each step is.  If you use a stepladder: ? Make sure that it is fully opened. Do not climb a closed  stepladder. ? Make sure the sides of the stepladder are locked in place. ? Ask someone to hold the stepladder while you use it.  Know where your pets are when moving through your home. What can I do in the bathroom?  Keep the floor dry. Clean up any water on the floor right away.  Remove soap buildup in the tub or shower.  Use nonskid mats or decals on the floor  of the tub or shower.  Attach bath mats securely with double-sided, nonslip rug tape.  If you need to sit down in the shower, use a plastic, nonslip stool.  Install grab bars by the toilet and in the tub and shower. Do not use towel bars as grab bars.      What can I do in the bedroom?  Make sure that you have a light by your bed that is easy to reach.  Do not use any sheets or blankets for your bed that hang to the floor.  Have a firm chair with side arms that you can use for support when you get dressed. What can I do in the kitchen?  Clean up any spills right away.  If you need to reach something above you, use a step stool with a grab bar.  Keep electrical cords out of the way.  Do not use floor polish or wax that makes floors slippery. What can I do with my stairs?  Do not leave any items on the stairs.  Make sure that you have a light switch at the top and the bottom of the stairs.  Make sure that there are handrails on both sides of the stairs. Fix handrails that are broken or loose.  Install nonslip stair treads on all your stairs.  Avoid having throw rugs at the top or bottom of the stairs.  Choose a carpet that does not hide the edge of the steps on the stairs.  Check carpeting to make sure that it is firmly attached to the stairs. Fix carpet that is loose or worn. What can I do on the outside of my home?  Use bright outdoor lighting.  Fix the edges of walkways and driveways and fix any cracks.  Remove anything that might make you trip as you walk through a door, such as a raised step or  threshold.  Trim any bushes or trees on paths to your home.  Check to see if handrails are loose or broken and that both sides of all steps have handrails.  Install guardrails along the edges of any raised decks and porches.  Clear paths of anything that can make you trip, such as tools or rocks.  Have leaves, snow, or ice cleared regularly.  Use sand or salt on paths during winter.  Clean up any spills in your garage right away. This includes grease or oil spills. What other actions can I take?  Wear shoes that: ? Have a low heel. Do not wear high heels. ? Have rubber bottoms. ? Feel good on your feet and fit well. ? Are closed at the toe. Do not wear open-toe sandals.  Use tools that help you move around if needed. These include: ? Canes. ? Walkers. ? Scooters. ? Crutches.  Review your medicines with your doctor. Some medicines can make you feel dizzy. This can increase your chance of falling. Ask your doctor what else you can do to help prevent falls. Where to find more information  Centers for Disease Control and Prevention, STEADI: http://www.wolf.info/  National Institute on Aging: http://kim-miller.com/ Contact a doctor if:  You are afraid of falling at home.  You feel weak, drowsy, or dizzy at home.  You fall at home. Summary  There are many simple things that you can do to make your home safe and to help prevent falls.  Ways to make your home safe include removing things that can make you trip and installing grab bars in  the bathroom.  Ask for help when making these changes in your home. This information is not intended to replace advice given to you by your health care provider. Make sure you discuss any questions you have with your health care provider. Document Revised: 08/30/2019 Document Reviewed: 08/30/2019 Elsevier Patient Education  [place].  Spinal Compression Fracture  A spinal compression fracture is a collapse of the bones that form the spine  (vertebrae). With this type of fracture, the vertebrae become pushed (compressed) into a wedge shape. Most compression fractures happen in the middle or lower part of the spine. What are the causes? This condition may be caused by:  Thinning and loss of density in the bones (osteoporosis). This is the most common cause.  A fall.  A car or motorcycle accident.  Cancer.  Trauma, such as a heavy, direct hit to the head or back. What increases the risk? You are more likely to develop this condition if:  You are 60 years of age or older.  You have osteoporosis.  You have certain types of cancer, including: ? Multiple myeloma. ? Lymphoma. ? Prostate cancer. ? Lung cancer. ? Breast cancer. What are the signs or symptoms? Symptoms of this condition include:  Severe pain with simple movements such as coughing or sneezing.  Pain that gets worse over time.  Pain that is worse when you stand, walk, sit, or bend.  Sudden pain that is so bad that it is hard for you to move.  Bending or humping of the spine.  Gradual loss of height.  Numbness, tingling, or weakness in the back and legs.  Trouble walking. Your symptoms will depend on the cause of the fracture and how quickly it develops. How is this diagnosed? This condition may be diagnosed based on symptoms, medical history, and a physical exam. During the physical exam, your health care provider may tap along the length of your spine to check for tenderness. Tests may be done to confirm the diagnosis. They may include:  A bone mineral density test to check for osteoporosis.  Imaging tests, such as a spine X-ray, CT scan, or MRI. How is this treated? Treatment depends on the cause and severity of the condition. Some fractures may heal on their own with supportive care. Treatment may include:  Pain medicine.  Rest.  A back brace.  Physical therapy exercises.  Medicine to strengthen bone.  Calcium and vitamin D  supplements. Fractures that cause the back to become misshapen, cause nerve pain or weakness, or do not respond to other treatment may be treated with surgery. This may include:  Vertebroplasty. Bone cement is injected into the collapsed vertebrae to stabilize them.  Balloon kyphoplasty. The collapsed vertebrae are expanded with a balloon and then bone cement is injected into them.  Spinal fusion. The collapsed vertebrae are connected (fused) to normal vertebrae. Follow these instructions at home: Medicines  Take over-the-counter and prescription medicines only as told by your health care provider.  Ask your health care provider if the medicine prescribed to you: ? Requires you to avoid driving or using machinery. ? Can cause constipation. You may need to take these actions to prevent or treat constipation:  Drink enough fluid to keep your urine pale yellow.  Take over-the-counter or prescription medicines.  Eat foods that are high in fiber, such as beans, whole grains, and fresh fruits and vegetables.  Limit foods that are high in fat and processed sugars, such as fried or sweet foods.  If you have a brace:  Wear the brace as told by your health care provider. Remove it only as told by your health care provider.  Loosen the brace if your fingers or toes tingle, become numb, or turn cold and blue.  Keep the brace clean.  If the brace is not waterproof: ? Do not let it get wet. ? Cover it with a watertight covering when you take a bath or a shower. Managing pain, stiffness, and swelling  If directed, put ice on the injured area. To do this: ? If you have a removable brace, remove it as told by your health care provider. ? Put ice in a plastic bag. ? Place a towel between your skin and the bag. ? Leave the ice on for 20 minutes, 2-3 times a day. ? Remove the ice if your skin turns bright red. This is very important. If you cannot feel pain, heat, or cold, you have a greater  risk of damage to the area.   Activity  Rest as told by your health care provider.  Avoid sitting for a long time without moving. Get up to take short walks every 1-2 hours. This is important to improve blood flow and breathing. Ask for help if you feel weak or unsteady.  Return to your normal activities as told by your health care provider. Ask what activities are safe for you.  Do physical therapy exercises to improve movement and strength in your back, as recommended by your health care provider.  Exercise regularly as directed by your health care provider. General instructions  Do not drink alcohol. Alcohol can interfere with your treatment.  Do not use any products that contain nicotine or tobacco, such as cigarettes, e-cigarettes, and chewing tobacco. These can delay bone healing. If you need help quitting, ask your health care provider.  Keep all follow-up visits. This is important. It can help to prevent permanent injury, disability, and long-lasting (chronic) pain.   Contact a health care provider if:  You have a fever.  Your pain medicine is not helping.  Your pain does not get better over time.  You cannot return to your normal activities as planned or expected. Get help right away if:  Your pain is very bad and it suddenly gets worse.  You are unable to move any body part (paralysis) that is below the level of your injury.  You have numbness, tingling, or weakness in any body part that is below the level of your injury.  You cannot control your bladder or bowels. Summary  A spinal compression fracture is a collapse of the bones that form the spine (vertebrae).  With this type of fracture, the vertebrae become pushed (compressed) into a wedge shape.  Your symptoms and treatment will depend on the cause and severity of the fracture and how quickly it develops.  Some fractures may heal on their own with supportive care. Fractures that cause the back to become  misshapen, cause nerve pain or weakness, or do not respond to other treatment may be treated with surgery. This information is not intended to replace advice given to you by your health care provider. Make sure you discuss any questions you have with your health care provider. Document Revised: 05/17/2019 Document Reviewed: 05/17/2019 Elsevier Patient Education  2021 [employer].

## 2020-04-05 ENCOUNTER — Non-Acute Institutional Stay: Payer: Medicare Other | Admitting: Nurse Practitioner

## 2020-04-05 ENCOUNTER — Encounter: Payer: Self-pay | Admitting: Nurse Practitioner

## 2020-04-05 DIAGNOSIS — N189 Chronic kidney disease, unspecified: Secondary | ICD-10-CM

## 2020-04-05 NOTE — Progress Notes (Signed)
[facility] Consult Note Telephone: [phone]  Fax: [phone]  PATIENT NAME: Theresa Snyder DOB: 23-Apr-1929 MRN: [id number]  PRIMARY CARE PROVIDER: [facility]  RESPONSIBLE PARTY:   Daughter [name] [phone] or [phone]  I was asked to see Theresa Snyder for Palliative care consult for complex medical decision making by Dr Reesa Chew  1. Advance Care Planning; full code; full scope of treatment; ongoing discussions with Theresa Snyder and Daughter [name]; 5-wishes are completed, will bring and review  2. Goals of Care: Goals include to maximize quality of life and symptom management. Our advance care planning conversation included a discussion about:     The value and importance of advance care planning   Exploration of personal, cultural or spiritual beliefs that might influence medical decisions   Exploration of goals of care in the event of a sudden injury or illness   Identification and preparation of a healthcare agent   Review and updating or creation of an  advance directive document.  3. Lumbar compression fracture; multiple rib fractures/Pain; controlled with current regimen; continue to monitor on pain scale; continue with brace; continue to monitor efficacy vs adverse side effects; continue physical therapy with non-pain modalities  4. CKD; continue to monitor bun/creatinine during STR  5. Palliative care encounter; Palliative care encounter; Palliative medicine team will continue to support patient, patient's family, and medical team. Visit consisted of counseling and education dealing with the complex and emotionally intense issues of symptom management and palliative care in the setting of serious and potentially life-threatening illness  6. f/u 1 week for ongoing monitoring chronic disease progression, ongoing discussions complex medical decision making  I reviewed the patient chart,  labs, medications and all admission documents from the facility here. I reviewed the patient, all hospital/facility records including H&P, discharge summary, medications, Lab tests ect. I discussed case with staff and discussed diagnosis and treatment plans with staff.   I spent 75 minutes providing this consultation, starting at 1:00pm. More than 50% of the time in this consultation was spent coordinating communication.   HISTORY OF PRESENT ILLNESS:  Theresa Snyder is a 87 y.o. year old female with multiple medical problems including Chronic kidney disease, arthritis, hypertension, hypothyroidism, seizures, spinal stenosis, L4 vertebral fracture, lymphedema of the leg, hypercholesterolemia, tibal ligation, appendectomy, bilateral cataract extraction with phaco. Hospitalize 2 /01/2021 to 2 / 19 / 2022 after a fall with closed compression fracture L4, multiple rib fractures. Has LSO brace in place; no plans for surgery / neurosurgery continue to follow outpatient. Neuropathy continued with Gabapentin. Levothyroxine for hypothyroidism. Take pravastatin for hyperlipidemia. Hypertension is controlled. Chronic kidney disease 3B stable. Theresa Snyder was discharged to [facility] for Short-Term Rehab where she currently resides. Theresa Snyder require staff assistance for bathing, dressing, mobility though she is able to stand and take a few steps with therapy. Theresa Snyder does feed herself in appetite is improving. Staff endorses overall Theresa Snyder is doing well. Theresa Snyder is a full code. At present Theresa Snyder is sitting in the wheelchair in her room. Theresa Snyder does have a visitor who is a longtime friend and her  Daughter [name] mother-in-law. Theresa Snyder does appear comfortable. I visited and observed Theresa Snyder. We talked about purpose of Palliative care visit. Theresa Snyder in agreement. Theresa Snyder endorses it was okay to do visit with visitor present. We  talked about symptoms of pain which is managed  with current regiment. We talked about Theresa Snyder appetite which is slowly improving. We talked about Theresa Snyder work with therapy, taking steps, slow progression. We talked about prior to hospitalization Theresa Snyder was living at home independently. Theresa Snyder was driving doing all of her own adl's completely independent, making meals. We talked about past medical history. We talked about recent hospitalization. We talked about medical goals of care including aggressive vs conservative versus comfort care. We talked about present time goals are for Theresa Snyder to be a full code. Theresa Snyder endorses she completed the five wishes but does not remember what it says. Theresa Snyder endorses is that her daughter can bring the five wishes and can be further reviewed for code status. Will continue full code at current time Theresa Snyder endorses she does not want to make any decision changes today. We talked about Theresa Snyder daughter, will to call an update on Palliative care visit. We talked about once Short-Term Rehab is over the goal is to transition back to her home independently or her daughter's house temporarily. Discuss that likely will discharge home with Home Health to include therapy. We talked about role Palliative care and plan of care. We talked about once discharged home can continue to follow a monitor progression, resources, chronic disease management with complex medical decision-making. Theresa Snyder in agreement but wish to further discuss with her daughter. Emotional support provided. Discussed will follow up in one week to see progress with therapy. Theresa Snyder in agreement. I called Theresa Snyder daughter. We talked about purpose of Palliative care visit. Clinical update discussed. We talked about Palliative care visit with Theresa Snyder, what was discussed. We talked about symptoms of pain, therapy progression. We talked about prior to fall with  hospitalization Theresa Snyder was independent at home. We talked about wishes to return home although she may need to come stay with her  Daughter [name] for a short period of time. We talked about discharging home with [facility] with Palliative in-home consult.  Daughter [name] in agreement. We talked about code status as Ms. Carneal is currently is a full code.  Daughter [name] endorses Ms Castagnola did complete five wishes and she will locate that so can revisit code status. We talked about follow-up Palliative care visit in one week. Therapeutic listening and emotional support provided. Contact information. Questions answered to satisfaction. I updated nursing staff the new changes that current time to plan of care.  Palliative Care was asked to help to continue to address goals of care.   CODE STATUS: Full code  PPS: 40% HOSPICE ELIGIBILITY/DIAGNOSIS: TBD  PAST MEDICAL HISTORY:  Past Medical History:  Diagnosis Date  . Arthritis   . Cancer ([employer])    skin  . Chronic kidney disease    history of renal insufficiency  . History of hiatal hernia   . Hypercholesteremia   . Hypertension   . Hypothyroidism   . Lymphedema of leg   . Pneumonia    in the past  . Seizures ([place]) 2014   no seizures since then  . Spinal stenosis     SOCIAL HX:  Social History   Tobacco Use  . Smoking status: Never Smoker  . Smokeless tobacco: Never Used  Substance Use Topics  . Alcohol use: No    Alcohol/week: 0.0 standard drinks    ALLERGIES:  Allergies  Allergen Reactions  . Iodinated Diagnostic Agents Other (See Comments)    Blue, Green and  Red dyes   . Lorazepam     Altered mental state  . Risperidone Other (See Comments)    Altered mental state   . Sulfamethoxazole-Trimethoprim     Hyper       PHYSICAL EXAM:   General: NAD, frail appearing, thin, pleasant female Cardiovascular: regular rate and rhythm Pulmonary: clear ant fields Neurological: stands  with therapy; walker  Meliton Samad Ihor Gully, NP

## 2020-04-08 ENCOUNTER — Other Ambulatory Visit: Payer: Self-pay

## 2020-04-12 ENCOUNTER — Other Ambulatory Visit: Payer: Self-pay

## 2020-04-12 ENCOUNTER — Non-Acute Institutional Stay: Payer: Medicare Other | Admitting: Nurse Practitioner

## 2020-04-12 ENCOUNTER — Encounter: Payer: Self-pay | Admitting: Nurse Practitioner

## 2020-04-12 VITALS — BP 107/59 | HR 89 | Wt 151.0 lb

## 2020-04-12 DIAGNOSIS — N189 Chronic kidney disease, unspecified: Secondary | ICD-10-CM

## 2020-04-12 DIAGNOSIS — Z515 Encounter for palliative care: Secondary | ICD-10-CM

## 2020-04-12 DIAGNOSIS — S32000D Wedge compression fracture of unspecified lumbar vertebra, subsequent encounter for fracture with routine healing: Secondary | ICD-10-CM

## 2020-04-12 NOTE — Progress Notes (Signed)
[facility] Consult Note Telephone: [phone]  Fax: [phone]  PATIENT NAME: ALAHNA DUNNE DOB: April 29, 1929 MRN: [id number]  PRIMARY CARE PROVIDER: [facility]  RESPONSIBLE PARTY:   Daughter [name] [phone] or [phone]  1.Advance Care Planning;full code; full scope of treatment; ongoing discussions with Ms. Fruge and Daughter [name]; 5-wishes are completed, will revisit at next visit  2. Goals of Care: Goals include to maximize quality of life and symptom management. Our advance care planning conversation included a discussion about:   The value and importance of advance care planning  Exploration of personal, cultural or spiritual beliefs that might influence medical decisions  Exploration of goals of care in the event of a sudden injury or illness  Identification and preparation of a healthcare agent  Review and updating or creation of anadvance directive document.  3. Lumbar compression fracture; multiple rib fractures/Pain; controlled with current regimen; continue to monitor on pain scale; continue with brace; continue to monitor efficacy vs adverse side effects; continue physical therapy with non-pain modalities  4. CKD; continue to monitor bun/creatinine during STR  5.Palliative care encounter; Palliative care encounter; Palliative medicine team will continue to support patient, patient's family, and medical team. Visit consisted of counseling and education dealing with the complex and emotionally intense issues of symptom management and palliative care in the setting of serious and potentially life-threatening illness  6. f/u1 week for ongoing monitoring chronic disease progression, ongoing discussions complex medical decision making  I spent 50 minutes providing this consultation,  Started at 1:00pm. More than 50% of the time in this consultation was spent  coordinating communication.   HISTORY OF PRESENT ILLNESS:  DERIN MATTHES is a 87 y.o. year old female with multiple medical problems including Ms Ramseyer continues to reside at [facility] at [facility]. Ms Dall was recently moved to the covid unit as she was tested positive. Ms Schissler is improving ambulatory more independent with transfers, mobility, bathing, dressing. Ms Godown does continue to work with therapy. Ms Rowe does feed herself. Ms. Stoneman continues to get hydrocodone 10 / 325 Q 8 hours as needed for pain receiving 30mg  / 24 hours with effective relief of rib pain. Staff endorses no new changes are concerns. I present Ms Engh is in isolation sitting in the wheelchair in her room. I visited and observed Ms Pilkington. We talked about the purpose of Palliative care visit. Ms Purdum in agreement. We talked about how she has been feeling. Ms Fulbright endorses she has been doing a little better. Ms. Henderson and I talked about having covid for the second time. We talked about isolation. We talked about 10 days. We talked about therapy and the work she has been doing. Ms Gasca has been ambulating in the room independently. We talked about symptoms of rib pain and the hydrocodone giving her relief. Ms Cove endorses she feels like she is in a bit of a time warp zone. "It's an odd feeling being in the same room and not being able to leave for multiple days in a row". Ms Lazcano talked at length about therapy. We talked about appetite which is slowly improving. Ms. Celmer endorses it depends on what food is brought to have much of an appetite she has. We talked about once therapy is completed her hope is to return home. We talked about palliative care services in home in addition to [facility] with therapy. Medical goals reviewed.  Discus will follow up within the week for ongoing monitoring of progress with therapy, symptoms, appetite, ongoing discussions of  complex decision-making and will revisit code status. Ms Zahn an agreement. Therapeutic listening and emotional support provided. I updated nursing staff menu changes at present time. I have attempted to contact Ms Rowe's daughter for update on Palliative care visit.   I called [name] Ms. Enyeart daughter, clinical update discussed. We talked about confusion intermit. We talked about PC visit with Ms. Kertz. We talked d/c planning. [name] endorses wishes are for Ms Cieslewicz to return to her home or Laura's. We talked about PC continue to follow in home. [name] in agreement. We talked about f/u visit 1 week. Medical goals reviewed. Emotional support provided.   Palliative Care was asked to help to continue to address goals of care.   CODE STATUS: Full code  PPS: 50% HOSPICE ELIGIBILITY/DIAGNOSIS: TBD  PAST MEDICAL HISTORY:  Past Medical History:  Diagnosis Date  . Arthritis   . Cancer ([place])    skin  . Chronic kidney disease    history of renal insufficiency  . History of hiatal hernia   . Hypercholesteremia   . Hypertension   . Hypothyroidism   . Lymphedema of leg   . Pneumonia    in the past  . Seizures ([place]) 2014   no seizures since then  . Spinal stenosis     SOCIAL HX:  Social History   Tobacco Use  . Smoking status: Never Smoker  . Smokeless tobacco: Never Used  Substance Use Topics  . Alcohol use: No    Alcohol/week: 0.0 standard drinks    ALLERGIES:  Allergies  Allergen Reactions  . Iodinated Diagnostic Agents Other (See Comments)    Blue, Green and Red dyes   . Lorazepam     Altered mental state  . Risperidone Other (See Comments)    Altered mental state   . Sulfamethoxazole-Trimethoprim     Hyper      PHYSICAL EXAM:   General: NAD, frail appearing, pleasant female Cardiovascular: regular rate and rhythm Pulmonary: clear ant fields Neurological: ambulatory  Padme Arriaga Ihor Gully, NP

## 2020-04-22 ENCOUNTER — Non-Acute Institutional Stay: Payer: Medicare Other | Admitting: Nurse Practitioner

## 2020-04-22 ENCOUNTER — Encounter: Payer: Self-pay | Admitting: Nurse Practitioner

## 2020-04-22 DIAGNOSIS — Z515 Encounter for palliative care: Secondary | ICD-10-CM

## 2020-04-22 DIAGNOSIS — S32000D Wedge compression fracture of unspecified lumbar vertebra, subsequent encounter for fracture with routine healing: Secondary | ICD-10-CM

## 2020-04-22 DIAGNOSIS — N189 Chronic kidney disease, unspecified: Secondary | ICD-10-CM

## 2020-04-22 NOTE — Progress Notes (Signed)
[facility] Consult Note Telephone: [phone]  Fax: [phone]  PATIENT NAME: Theresa Snyder DOB: [birth year] MRN: [id number]   [place]  RESPONSIBLE PARTY:Daughter [name] [phone] or [phone]  1.Advance Care Planning;full code; full scope of treatment; ongoing discussions with Theresa Snyder andDaughter [name]  2. Goals of Care: Goals include to maximize quality of life and symptom management. Our advance care planning conversation included a discussion about:   The value and importance of advance care planning  Exploration of personal, cultural or spiritual beliefs that might influence medical decisions  Exploration of goals of care in the event of a sudden injury or illness  Identification and preparation of a healthcare agent  Review and updating or creation of anadvance directive document.  3.Lumbar compression fracture; multiple rib fractures/Pain; controlled with current regimen; continue to monitor on pain scale; continue with brace; continue to monitor efficacy vs adverse side effects; continue physical therapy with non-pain modalities  4. CKD; continue to monitor bun/creatinine during STR  5.Palliative care encounter; Palliative care encounter; Palliative medicine team will continue to support patient, patient's family, and medical team. Visit consisted of counseling and education dealing with the complex and emotionally intense issues of symptom management and palliative care in the setting of serious and potentially life-threatening illness  6. f/u1weekfor ongoing monitoring chronic disease progression, ongoing discussions complex medical decision making  I spent 35 minutes providing this consultation, start at 1:45pm More than 50% of the time in this consultation was spent coordinating communication.   HISTORY OF PRESENT  ILLNESS:  ANGELENE ROME is a 87 y.o. year old female with multiple medical problems including Chronic kidney disease, arthritis, hypertension, hypothyroidism, seizures, spinal stenosis, L4 vertebral fracture, lymphedema of the leg, hypercholesterolemia, tibal ligation, appendectomy, bilateral cataract extraction with phaco. Hospitalize 2 /01/2021 to 2 / 19 / 2022 after a fall with closed compression fracture L4, multiple rib fractures. Has LSO brace in place; no plans for surgery / neurosurgery continue to follow outpatient. Neuropathy continued with Gabapentin. Levothyroxine for hypothyroidism. Take pravastatin for hyperlipidemia. Hypertension is controlled. Chronic kidney disease 3B stable. Theresa Snyder continues to reside at [facility] for Short Term rehab. Theresa Snyder continues to require assistance with transfers, ambulates with walker, bathing, dressing. Theresa Snyder feeds herself with appetite improving, though today staff endorses became nausea. Theresa Snyder continues to work with therapy. Staff endorses no other changes. At present, Theresa Snyder is sitting up in bed, appears comfortable. Theresa Snyder endorses she continues to be nausea but just received antimedic from nursing. Theresa Snyder endorses she is starting to feel better, sipping in ginger ale. Theresa Snyder endorses today has been a harder day, more tired. Theresa Snyder talked about working with therapy. We talked about medical goals of care. We talked about quality of life. We talked about plan d/c home with daughter. Most of PC visit today supportive. Theresa Snyder was cooperative with assessment. We talked about f/u visit in 1 week or sooner if declines. I updated staff, no new changes.   Palliative Care was asked to help to continue to address goals of care.   CODE STATUS: Full code  PPS: 50% HOSPICE ELIGIBILITY/DIAGNOSIS: TBD  PAST MEDICAL HISTORY:  Past Medical History:  Diagnosis Date  . Arthritis   . Cancer  ([place])    skin  . Chronic kidney disease    history of renal insufficiency  . History of hiatal hernia   .  Hypercholesteremia   . Hypertension   . Hypothyroidism   . Lymphedema of leg   . Pneumonia    in the past  . Seizures ([place]) 2014   no seizures since then  . Spinal stenosis     SOCIAL HX:  Social History   Tobacco Use  . Smoking status: Never Smoker  . Smokeless tobacco: Never Used  Substance Use Topics  . Alcohol use: No    Alcohol/week: 0.0 standard drinks    ALLERGIES:  Allergies  Allergen Reactions  . Iodinated Diagnostic Agents Other (See Comments)    Blue, Green and Red dyes   . Lorazepam     Altered mental state  . Risperidone Other (See Comments)    Altered mental state   . Sulfamethoxazole-Trimethoprim     Hyper      PHYSICAL EXAM:   General: NAD, frail appearing, thin pleasant female Cardiovascular: regular rate and rhythm Pulmonary: clear ant fields Neurological: generalized weakness  Dozier Berkovich Ihor Gully, NP

## 2020-04-23 ENCOUNTER — Other Ambulatory Visit: Payer: Self-pay

## 2020-06-21 ENCOUNTER — Telehealth: Payer: Self-pay | Admitting: Adult Health Nurse Practitioner

## 2020-06-21 NOTE — Telephone Encounter (Signed)
Spoke with patient's daughter [name], to see how patient was doing after being dc'd home from [facility], and said she was doing really well.  I asked daughter if she felt like they needed the Palliative NP to come and check on her and she said she didn't think so.  I told daughter that we would go ahead and discharge her from Palliative services for now and that if anything changed and they wanted [place] to come back out to let her MD know or call us back and she was in agreement with this.

## 2020-07-09 ENCOUNTER — Emergency Department
Admission: EM | Admit: 2020-07-09 | Discharge: 2020-07-09 | Disposition: A | Discharge status: Home / Self Care | Payer: Medicare Other | Attending: Emergency Medicine | Admitting: Emergency Medicine

## 2020-07-09 ENCOUNTER — Emergency Department: Payer: Medicare Other

## 2020-07-09 DIAGNOSIS — R5381 Other malaise: Secondary | ICD-10-CM | POA: Insufficient documentation

## 2020-07-09 DIAGNOSIS — N189 Chronic kidney disease, unspecified: Secondary | ICD-10-CM | POA: Diagnosis not present

## 2020-07-09 DIAGNOSIS — E039 Hypothyroidism, unspecified: Secondary | ICD-10-CM | POA: Diagnosis not present

## 2020-07-09 DIAGNOSIS — Z85828 Personal history of other malignant neoplasm of skin: Secondary | ICD-10-CM | POA: Diagnosis not present

## 2020-07-09 DIAGNOSIS — B349 Viral infection, unspecified: Secondary | ICD-10-CM | POA: Diagnosis not present

## 2020-07-09 DIAGNOSIS — I129 Hypertensive chronic kidney disease with stage 1 through stage 4 chronic kidney disease, or unspecified chronic kidney disease: Secondary | ICD-10-CM | POA: Diagnosis not present

## 2020-07-09 DIAGNOSIS — M791 Myalgia, unspecified site: Secondary | ICD-10-CM | POA: Insufficient documentation

## 2020-07-09 DIAGNOSIS — Z79899 Other long term (current) drug therapy: Secondary | ICD-10-CM | POA: Insufficient documentation

## 2020-07-09 DIAGNOSIS — R0602 Shortness of breath: Secondary | ICD-10-CM

## 2020-07-09 DIAGNOSIS — Z20822 Contact with and (suspected) exposure to covid-19: Secondary | ICD-10-CM | POA: Diagnosis not present

## 2020-07-09 LAB — URINALYSIS, COMPLETE (UACMP) WITH MICROSCOPIC
Bilirubin Urine: NEGATIVE
Glucose, UA: NEGATIVE mg/dL
Ketones, ur: NEGATIVE mg/dL
Nitrite: NEGATIVE
Protein, ur: NEGATIVE mg/dL
Specific Gravity, Urine: 1.008 (ref 1.005–1.030)
pH: 8 (ref 5.0–8.0)

## 2020-07-09 LAB — COMPREHENSIVE METABOLIC PANEL
ALT: 12 U/L (ref 0–44)
AST: 28 U/L (ref 15–41)
Albumin: 4.3 g/dL (ref 3.5–5.0)
Alkaline Phosphatase: 75 U/L (ref 38–126)
Anion gap: 14 (ref 5–15)
BUN: 17 mg/dL (ref 8–23)
CO2: 21 mmol/L — ABNORMAL LOW (ref 22–32)
Calcium: 9.2 mg/dL (ref 8.9–10.3)
Chloride: 104 mmol/L (ref 98–111)
Creatinine, Ser: 1.52 mg/dL — ABNORMAL HIGH (ref 0.44–1.00)
GFR, Estimated: 33 mL/min — ABNORMAL LOW (ref >60)
Glucose, Bld: 149 mg/dL — ABNORMAL HIGH (ref 70–99)
Potassium: 3.6 mmol/L (ref 3.5–5.1)
Sodium: 139 mmol/L (ref 135–145)
Total Bilirubin: 1.2 mg/dL (ref 0.3–1.2)
Total Protein: 7.3 g/dL (ref 6.5–8.1)

## 2020-07-09 LAB — BRAIN NATRIURETIC PEPTIDE: B Natriuretic Peptide: 127.8 pg/mL — ABNORMAL HIGH (ref 0.0–100.0)

## 2020-07-09 LAB — CBC
HCT: 37.6 % (ref 36.0–46.0)
Hemoglobin: 13 g/dL (ref 12.0–15.0)
MCH: 31.9 pg (ref 26.0–34.0)
MCHC: 34.6 g/dL (ref 30.0–36.0)
MCV: 92.4 fL (ref 80.0–100.0)
Platelets: 273 10*3/uL (ref 150–400)
RBC: 4.07 MIL/uL (ref 3.87–5.11)
RDW: 12.5 % (ref 11.5–15.5)
WBC: 10.4 10*3/uL (ref 4.0–10.5)
nRBC: 0 % (ref 0.0–0.2)

## 2020-07-09 LAB — RESP PANEL BY RT-PCR (FLU A&B, COVID) ARPGX2
Influenza A by PCR: NEGATIVE
Influenza B by PCR: NEGATIVE
SARS Coronavirus 2 by RT PCR: NEGATIVE

## 2020-07-09 LAB — TROPONIN I (HIGH SENSITIVITY): Troponin I (High Sensitivity): 12 ng/L (ref <18)

## 2020-07-09 MED ORDER — ACETAMINOPHEN 500 MG PO TABS
1000.0000 mg | ORAL_TABLET | Freq: Once | ORAL | Status: AC
  Administered 2020-07-09: 1000 mg via ORAL
  Filled 2020-07-09: qty 2

## 2020-07-09 MED ORDER — OXYCODONE HCL 5 MG PO TABS
10.0000 mg | ORAL_TABLET | Freq: Once | ORAL | Status: AC
Start: 2020-07-09 — End: 2020-07-09
  Administered 2020-07-09: 10 mg via ORAL
  Filled 2020-07-09: qty 2

## 2020-07-09 NOTE — ED Notes (Signed)
Patient assisted on and off bedpan.

## 2020-07-09 NOTE — ED Triage Notes (Addendum)
Pt to ED via [facility] from home. Per EMS family called due to pt having intermittent LOC. Pt A&Ox3 upon EMS arrival. Pt c/o nausea and SOB. Pt ST in route. Pt denies recent fall or injury. Pt denies CP, dizziness, or blurry vision.   Upon arrival pt A&Ox3 with labored breathing.

## 2020-07-09 NOTE — Discharge Instructions (Signed)
As we discussed, please continue all of your medications at home.   If you develop any further worsening of your symptoms, please return to the ED.  Otherwise, follow-up with your PCP in the next week.

## 2020-07-09 NOTE — ED Provider Notes (Signed)
[facility] Emergency Department Provider Note ____________________________________________   Event Date/Time   First MD Initiated Contact with Patient 07/09/20 1221     (approximate)  I have reviewed the triage vital signs and the nursing notes.  HISTORY  Chief Complaint Shortness of Breath   HPI Theresa Snyder is a 87 y.o. femalewho presents to the ED for evaluation of shortness of breath.  Chart review indicates HTN, HLD, CKD  Patient presents to the ED with her daughter for evaluation of of shortness of breath, nausea, myalgias and "feeling like crud."   Patient reports returning from a trip to the beach late last week, and 2 days later, which was about 4 days ago now, she reports developing myalgias, watery diarrhea, poor p.o. intake, generalized fatigue and malaise.  Reports feeling short of breath with ambulation, but denies productive cough or chest pain.  Denies abdominal pain, dizziness or syncope.  Past Medical History:  Diagnosis Date  . Arthritis   . Cancer ([place])    skin  . Chronic kidney disease    history of renal insufficiency  . History of hiatal hernia   . Hypercholesteremia   . Hypertension   . Hypothyroidism   . Lymphedema of leg   . Pneumonia    in the past  . Seizures ([place]) 2014   no seizures since then  . Spinal stenosis     Patient Active Problem List   Diagnosis Date Noted  . Lumbar compression fracture ([place]) 03/24/2020  . Fall 03/23/2020  . L4 vertebral fracture ([place]) 03/23/2020  . Chronic kidney disease 03/04/2015  . Adaptive colitis 03/04/2015  . BP (high blood pressure) 05/18/2013  . Adult hypothyroidism 05/18/2013  . HLD (hyperlipidemia) 05/18/2013    Past Surgical History:  Procedure Laterality Date  . APPENDECTOMY    . CATARACT EXTRACTION W/PHACO Left 09/25/2015   Procedure: CATARACT EXTRACTION PHACO AND INTRAOCULAR LENS PLACEMENT (IOC);  Surgeon: Estill Cotta, MD;  Location: [facility] ORS;  Service:  Ophthalmology;  Laterality: Left;  [place] 01:23 AP% 24.0 CDE 34.98 Fluid pack lot # [id number]  . CATARACT EXTRACTION W/PHACO Right 07/01/2016   Procedure: CATARACT EXTRACTION PHACO AND INTRAOCULAR LENS PLACEMENT (IOC);  Surgeon: Estill Cotta, MD;  Location: [facility] ORS;  Service: Ophthalmology;  Laterality: Right;  [place] 1:25.9 AP% 24.8 CDE 40.61 Fluid Pack Lot # [id number]  . TUBAL LIGATION      Prior to Admission medications   Medication Sig Start Date End Date Taking? Authorizing Provider  amLODipine (NORVASC) 10 MG tablet Take 10 mg by mouth daily with breakfast. for high blood pressure 01/12/15   [provider]  benazepril (LOTENSIN) 20 MG tablet Take 20 mg by mouth daily. 01/12/15   [provider]  gabapentin (NEURONTIN) 300 MG capsule Take 300 mg by mouth daily. 01/12/15   [provider]  hydrochlorothiazide (HYDRODIURIL) 25 MG tablet Take 25 mg by mouth daily. 08/15/12   [provider]  levothyroxine (SYNTHROID, LEVOTHROID) 88 MCG tablet Take 88 mcg by mouth daily before breakfast. 01/12/15   [provider]  meclizine (ANTIVERT) 25 MG tablet Take 12.5-25 mg by mouth 3 (three) times daily as needed for dizziness or nausea. 02/14/15   [provider]  pravastatin (PRAVACHOL) 20 MG tablet Take 20 mg by mouth at bedtime.     [provider]    Allergies Iodinated diagnostic agents, Lorazepam, Risperidone, and Sulfamethoxazole-trimethoprim  No family history on file.  Social History Social History   Tobacco Use  .  Smoking status: Never Smoker  . Smokeless tobacco: Never Used  Substance Use Topics  . Alcohol use: No    Alcohol/week: 0.0 standard drinks  . Drug use: No    Review of Systems  Constitutional: No fever/chills Eyes: No visual changes. ENT: No sore throat. Cardiovascular: Denies chest pain. Respiratory: Positive for shortness of breath Gastrointestinal: No abdominal pain. no vomiting.  No diarrhea.  No  constipation. Positive for nausea and poor p.o. intake Genitourinary: Negative for dysuria. Musculoskeletal: Positive for chronic back pain. Skin: Negative for rash. Neurological: Negative for  focal weakness or numbness.  Positive for dizziness  ____________________________________________   PHYSICAL EXAM:  VITAL SIGNS: Vitals:   07/09/20 1202 07/09/20 1430  BP: 124/78 120/64  Pulse:  (!) 55  Resp:  18  Temp:    SpO2:  96%    Constitutional: Alert and oriented. Well appearing and in no acute distress.  Little hard of hearing, but largely pleasant and conversational Eyes: Conjunctivae are normal. PERRL. EOMI. Head: Atraumatic. Nose: No congestion/rhinnorhea. Mouth/Throat: Mucous membranes are moist.  Oropharynx non-erythematous. Neck: No stridor. No cervical spine tenderness to palpation. Cardiovascular: Normal rate, regular rhythm. Grossly normal heart sounds.  Good peripheral circulation. Respiratory: Normal respiratory effort.  No retractions. Lungs CTAB. Gastrointestinal: Soft , nondistended, nontender to palpation. No CVA tenderness. Musculoskeletal: No lower extremity tenderness nor edema.  No joint effusions. No signs of acute trauma. Neurologic:  Normal speech and language. No gross focal neurologic deficits are appreciated. No gait instability noted. Skin:  Skin is warm, dry and intact. No rash noted. Psychiatric: Mood and affect are normal. Speech and behavior are normal. ____________________________________________   LABS (all labs ordered are listed, but only abnormal results are displayed)  Labs Reviewed  COMPREHENSIVE METABOLIC PANEL - Abnormal; Notable for the following components:      Result Value   CO2 21 (*)    Glucose, Bld 149 (*)    Creatinine, Ser 1.52 (*)    GFR, Estimated 33 (*)    All other components within normal limits  BRAIN NATRIURETIC PEPTIDE - Abnormal; Notable for the following components:   B Natriuretic Peptide 127.8 (*)    All other  components within normal limits  URINALYSIS, COMPLETE (UACMP) WITH MICROSCOPIC - Abnormal; Notable for the following components:   Color, Urine YELLOW (*)    APPearance CLEAR (*)    Hgb urine dipstick SMALL (*)    Leukocytes,Ua LARGE (*)    Bacteria, UA RARE (*)    All other components within normal limits  RESP PANEL BY RT-PCR (FLU A&B, COVID) ARPGX2  CBC  TROPONIN I (HIGH SENSITIVITY)  TROPONIN I (HIGH SENSITIVITY)   ____________________________________________  12 Lead EKG  Sinus rhythm with sinus arrhythmia, rate of 100 bpm.  Normal axis and intervals.  No evidence of acute ischemia. ____________________________________________  RADIOLOGY  ED MD interpretation: 2 view CXR reviewed by me with chronic left basilar atelectasis without acute cardiopulmonary pathology  Official radiology report(s): DG Chest 2 View  Result Date: 07/09/2020 CLINICAL DATA:  Shortness of breath EXAM: CHEST - 2 VIEW COMPARISON:  March 23, 2020 chest radiograph and chest CT FINDINGS: Chronic atelectatic changes noted in the left base, stable. No edema or airspace opacity. Heart is upper normal in size with pulmonary vascularity normal. There is a sizable hiatal type hernia. There is calcification in the mitral annulus. Bones are osteoporotic. There is degenerative change in the thoracic spine and in each shoulder. IMPRESSION: Chronic atelectasis left base. Lungs elsewhere  clear. Stable cardiac silhouette. Large hiatal type hernia. Bones osteoporotic. Aortic Atherosclerosis (ICD10-I70.0). Electronically Signed   By: Lowella Grip III M.D.   On: 07/09/2020 12:48    ____________________________________________   PROCEDURES and INTERVENTIONS  Procedure(s) performed (including Critical Care):  Procedures  Medications  acetaminophen (TYLENOL) tablet 1,000 mg (1,000 mg Oral Given 07/09/20 1327)  oxyCODONE (Oxy IR/ROXICODONE) immediate release tablet 10 mg (10 mg Oral Given 07/09/20 1327)     ____________________________________________   MDM / ED COURSE   87 year old woman presents with her daughter for evaluation of a few days of generalized myalgias, shortness of breath and nausea.  Likely due to a viral syndrome and ultimately amenable to outpatient management.  Normal vitals on room air.  Exam is reassuring without evidence of focal neurologic or vascular deficits.  She looks well without distress or signs of trauma.  Work-up was largely benign with CKD around baseline, no evidence of sepsis, COVID-19, influenza or CAP.  Her BNP is elevated but she has no further symptoms of CHF exacerbation such as orthopnea or increased lower extremity swelling.  CXR is clear.  Provide her with her chronic dose of pain medications and she reports feeling much better after this.  She reports a desire to be managed as an outpatient, which I think is reasonable as has seen no evidence of significant illness and her symptoms may be related to a viral syndrome.  She reports having multiple family was close by to check on her and help her over the next few days.  We discussed following up with her PCP later this week or early next week, and we discussed return precautions for the ED.  Patient stable for outpatient management.   Clinical Course as of 07/09/20 1655  Tue Jul 09, 2020  1453 Reassessed.  Patient reports feeling better after medications and is requesting discharge.  Daughter at bedside indicates that they are ready to go.  We discussed largely benign work-up.  I again asked her about her shortness of breath and she reports it feels better now.  She denies any orthopnea and reports her lower extremity swelling is better than her normal considering her history of lymphedema.  We discussed the possibility of viral syndrome contributing to her symptoms.  We discussed return precautions for the ED. [DS]    Clinical Course User Index [DS] Vladimir Crofts, MD     ____________________________________________   FINAL CLINICAL IMPRESSION(S) / ED DIAGNOSES  Final diagnoses:  Viral syndrome  SOB (shortness of breath)     ED Discharge Orders    None       Natalyia Innes   Note:  This document was prepared using Dragon voice recognition software and may include unintentional dictation errors.   Vladimir Crofts, MD 07/09/20 [phone]

## 2020-07-09 NOTE — ED Notes (Signed)
Pt assisted onto bedpan.

## 2021-05-21 ENCOUNTER — Emergency Department: Payer: Medicare Other

## 2021-05-21 ENCOUNTER — Inpatient Hospital Stay
Admission: EM | Admit: 2021-05-21 | Discharge: 2021-05-24 | DRG: 871 | Disposition: A | Discharge status: Home Health | Payer: Medicare Other | Attending: Pulmonary Disease | Admitting: Pulmonary Disease

## 2021-05-21 ENCOUNTER — Inpatient Hospital Stay: Payer: Medicare Other

## 2021-05-21 DIAGNOSIS — Z91041 Radiographic dye allergy status: Secondary | ICD-10-CM | POA: Diagnosis not present

## 2021-05-21 DIAGNOSIS — Z79899 Other long term (current) drug therapy: Secondary | ICD-10-CM | POA: Diagnosis not present

## 2021-05-21 DIAGNOSIS — K56609 Unspecified intestinal obstruction, unspecified as to partial versus complete obstruction: Secondary | ICD-10-CM | POA: Diagnosis present

## 2021-05-21 DIAGNOSIS — Z9851 Tubal ligation status: Secondary | ICD-10-CM

## 2021-05-21 DIAGNOSIS — Z882 Allergy status to sulfonamides status: Secondary | ICD-10-CM

## 2021-05-21 DIAGNOSIS — E872 Acidosis, unspecified: Secondary | ICD-10-CM | POA: Diagnosis present

## 2021-05-21 DIAGNOSIS — N170 Acute kidney failure with tubular necrosis: Secondary | ICD-10-CM | POA: Diagnosis present

## 2021-05-21 DIAGNOSIS — A419 Sepsis, unspecified organism: Secondary | ICD-10-CM | POA: Diagnosis present

## 2021-05-21 DIAGNOSIS — R6520 Severe sepsis without septic shock: Secondary | ICD-10-CM | POA: Diagnosis present

## 2021-05-21 DIAGNOSIS — Z9842 Cataract extraction status, left eye: Secondary | ICD-10-CM | POA: Diagnosis not present

## 2021-05-21 DIAGNOSIS — K449 Diaphragmatic hernia without obstruction or gangrene: Secondary | ICD-10-CM | POA: Diagnosis present

## 2021-05-21 DIAGNOSIS — Z9049 Acquired absence of other specified parts of digestive tract: Secondary | ICD-10-CM | POA: Diagnosis not present

## 2021-05-21 DIAGNOSIS — N1832 Chronic kidney disease, stage 3b: Secondary | ICD-10-CM | POA: Diagnosis present

## 2021-05-21 DIAGNOSIS — M48061 Spinal stenosis, lumbar region without neurogenic claudication: Secondary | ICD-10-CM | POA: Diagnosis present

## 2021-05-21 DIAGNOSIS — R569 Unspecified convulsions: Secondary | ICD-10-CM | POA: Diagnosis present

## 2021-05-21 DIAGNOSIS — I129 Hypertensive chronic kidney disease with stage 1 through stage 4 chronic kidney disease, or unspecified chronic kidney disease: Secondary | ICD-10-CM | POA: Diagnosis present

## 2021-05-21 DIAGNOSIS — R579 Shock, unspecified: Secondary | ICD-10-CM | POA: Diagnosis present

## 2021-05-21 DIAGNOSIS — E039 Hypothyroidism, unspecified: Secondary | ICD-10-CM | POA: Diagnosis present

## 2021-05-21 DIAGNOSIS — Z888 Allergy status to other drugs, medicaments and biological substances status: Secondary | ICD-10-CM

## 2021-05-21 DIAGNOSIS — Z9841 Cataract extraction status, right eye: Secondary | ICD-10-CM

## 2021-05-21 DIAGNOSIS — Z85828 Personal history of other malignant neoplasm of skin: Secondary | ICD-10-CM

## 2021-05-21 DIAGNOSIS — R1084 Generalized abdominal pain: Principal | ICD-10-CM

## 2021-05-21 DIAGNOSIS — Z79890 Hormone replacement therapy (postmenopausal): Secondary | ICD-10-CM | POA: Diagnosis not present

## 2021-05-21 DIAGNOSIS — R109 Unspecified abdominal pain: Secondary | ICD-10-CM | POA: Diagnosis present

## 2021-05-21 DIAGNOSIS — R7989 Other specified abnormal findings of blood chemistry: Secondary | ICD-10-CM

## 2021-05-21 DIAGNOSIS — E7800 Pure hypercholesterolemia, unspecified: Secondary | ICD-10-CM | POA: Diagnosis present

## 2021-05-21 DIAGNOSIS — E860 Dehydration: Secondary | ICD-10-CM | POA: Diagnosis present

## 2021-05-21 LAB — COMPREHENSIVE METABOLIC PANEL
ALT: 11 U/L (ref 0–44)
AST: 24 U/L (ref 15–41)
Albumin: 4.1 g/dL (ref 3.5–5.0)
Alkaline Phosphatase: 67 U/L (ref 38–126)
Anion gap: 13 (ref 5–15)
BUN: 56 mg/dL — ABNORMAL HIGH (ref 8–23)
CO2: 22 mmol/L (ref 22–32)
Calcium: 8.3 mg/dL — ABNORMAL LOW (ref 8.9–10.3)
Chloride: 101 mmol/L (ref 98–111)
Creatinine, Ser: 4.36 mg/dL — ABNORMAL HIGH (ref 0.44–1.00)
GFR, Estimated: 9 mL/min — ABNORMAL LOW (ref >60)
Glucose, Bld: 125 mg/dL — ABNORMAL HIGH (ref 70–99)
Potassium: 3.9 mmol/L (ref 3.5–5.1)
Sodium: 136 mmol/L (ref 135–145)
Total Bilirubin: 0.6 mg/dL (ref 0.3–1.2)
Total Protein: 7.4 g/dL (ref 6.5–8.1)

## 2021-05-21 LAB — URINALYSIS, ROUTINE W REFLEX MICROSCOPIC
Bacteria, UA: NONE SEEN
Bilirubin Urine: NEGATIVE
Glucose, UA: NEGATIVE mg/dL
Hgb urine dipstick: NEGATIVE
Ketones, ur: NEGATIVE mg/dL
Leukocytes,Ua: NEGATIVE
Nitrite: NEGATIVE
Protein, ur: 100 mg/dL — AB
Specific Gravity, Urine: 1.016 (ref 1.005–1.030)
pH: 5 (ref 5.0–8.0)

## 2021-05-21 LAB — LACTIC ACID, PLASMA
Lactic Acid, Venous: 3.9 mmol/L (ref 0.5–1.9)
Lactic Acid, Venous: 2.8 mmol/L (ref 0.5–1.9)

## 2021-05-21 LAB — CBC WITH DIFFERENTIAL/PLATELET
Abs Immature Granulocytes: 0.08 10*3/uL — ABNORMAL HIGH (ref 0.00–0.07)
Basophils Absolute: 0 10*3/uL (ref 0.0–0.1)
Basophils Relative: 0 %
Eosinophils Absolute: 0.2 10*3/uL (ref 0.0–0.5)
Eosinophils Relative: 1 %
HCT: 38.2 % (ref 36.0–46.0)
Hemoglobin: 12.4 g/dL (ref 12.0–15.0)
Immature Granulocytes: 1 %
Lymphocytes Relative: 50 %
Lymphs Abs: 6.5 10*3/uL — ABNORMAL HIGH (ref 0.7–4.0)
MCH: 33.2 pg (ref 26.0–34.0)
MCHC: 32.5 g/dL (ref 30.0–36.0)
MCV: 102.1 fL — ABNORMAL HIGH (ref 80.0–100.0)
Monocytes Absolute: 0.8 10*3/uL (ref 0.1–1.0)
Monocytes Relative: 6 %
Neutro Abs: 5.4 10*3/uL (ref 1.7–7.7)
Neutrophils Relative %: 42 %
Platelets: 273 10*3/uL (ref 150–400)
RBC: 3.74 MIL/uL — ABNORMAL LOW (ref 3.87–5.11)
RDW: 13.2 % (ref 11.5–15.5)
Smear Review: NORMAL
WBC: 12.9 10*3/uL — ABNORMAL HIGH (ref 4.0–10.5)
nRBC: 0 % (ref 0.0–0.2)

## 2021-05-21 LAB — GLUCOSE, CAPILLARY: Glucose-Capillary: 211 mg/dL — ABNORMAL HIGH (ref 70–99)

## 2021-05-21 LAB — TROPONIN I (HIGH SENSITIVITY)
Troponin I (High Sensitivity): 11 ng/L (ref <18)
Troponin I (High Sensitivity): 14 ng/L (ref <18)

## 2021-05-21 LAB — LIPASE, BLOOD
Lipase: 89 U/L — ABNORMAL HIGH (ref 11–51)
Lipase: 82 U/L — ABNORMAL HIGH (ref 11–51)

## 2021-05-21 LAB — BRAIN NATRIURETIC PEPTIDE: B Natriuretic Peptide: 33.7 pg/mL (ref 0.0–100.0)

## 2021-05-21 LAB — AMYLASE: Amylase: 466 U/L — ABNORMAL HIGH (ref 28–100)

## 2021-05-21 LAB — PROCALCITONIN: Procalcitonin: <0.1 ng/mL

## 2021-05-21 MED ORDER — PIPERACILLIN-TAZOBACTAM IN DEX 2-0.25 GM/50ML IV SOLN
2.2500 g | Freq: Three times a day (TID) | INTRAVENOUS | Status: DC
  Administered 2021-05-21 – 2021-05-22 (×2): 2.25 g via INTRAVENOUS
  Filled 2021-05-21 (×3): qty 50

## 2021-05-21 MED ORDER — LACTATED RINGERS IV BOLUS
1000.0000 mL | Freq: Once | INTRAVENOUS | Status: AC
  Administered 2021-05-21: 1000 mL via INTRAVENOUS

## 2021-05-21 MED ORDER — CHLORHEXIDINE GLUCONATE CLOTH 2 % EX PADS
6.0000 | MEDICATED_PAD | Freq: Every day | CUTANEOUS | Status: DC
  Administered 2021-05-21 – 2021-05-24 (×4): 6 via TOPICAL

## 2021-05-21 MED ORDER — NOREPINEPHRINE 4 MG/250ML-% IV SOLN
0.0000 ug/min | INTRAVENOUS | Status: DC
  Administered 2021-05-21: 5 ug/min via INTRAVENOUS
  Administered 2021-05-22: 2 ug/min via INTRAVENOUS
  Filled 2021-05-21: qty 250

## 2021-05-21 MED ORDER — DOCUSATE SODIUM 100 MG PO CAPS: 100.0000 mg | ORAL_CAPSULE | Freq: Two times a day (BID) | ORAL | Status: DC | PRN

## 2021-05-21 MED ORDER — POLYETHYLENE GLYCOL 3350 17 G PO PACK: 17.0000 g | PACK | Freq: Every day | ORAL | Status: DC | PRN

## 2021-05-21 MED ORDER — NOREPINEPHRINE 4 MG/250ML-% IV SOLN
INTRAVENOUS | Status: AC
  Filled 2021-05-21: qty 250

## 2021-05-21 NOTE — ED Triage Notes (Signed)
No bowel movement x 3 days, nausea x 1 day , pt lives at [facility] ?

## 2021-05-21 NOTE — ED Provider Notes (Signed)
? ?[facility] ?Provider Note ? ? ? Event Date/Time  ? First MD Initiated Contact with Patient 05/21/21 1834   ?  (approximate) ? ? ?History  ? ?Abdominal Pain (Abd pain , syncopal, hypotensive , cramps ) ? ? ?HPI ? ?JAZLYNE GAUGER is a 88 y.o. female with a past history of chronic kidney disease, arthritis, hypertension, hypothyroidism, seizures, spinal stenosis, L4 vertebral fracture, lymphedema of the leg, hypercholesterolemia, tibal ligation, appendectomy, bilateral cataract extraction with phaco. Hospitalize 2 /01/2021 to 2 / 19 / 2022 after a fall with closed compression fracture L4, multiple rib fractures.  Patient takes narcotics for low back pain and other pains.  She has become constipated has not had a stool in several days.  She went to have a stool and developed severe pain.  EMS was called and found low blood pressure in the 70s.  Patient has pain in the right lower quadrant in the right flank.  Bedside ultrasound was done but was unrevealing.  The best I could say was there was no fluid on the right side between the liver and the kidney.  There is almost no urine in the bladder.  There was no fluid there either. ? ?  ? ? ?Physical Exam  ? ?Triage Vital Signs: ?ED Triage Vitals  ?Enc Vitals Group  ?   BP 05/21/21 1830 (!) 73/48  ?   Pulse Rate 05/21/21 1830 72  ?   Resp 05/21/21 1830 17  ?   Temp 05/21/21 1830 98.5 ?F (36.9 ?C)  ?   Temp Source 05/21/21 1830 Oral  ?   SpO2 05/21/21 1830 96 %  ?   Weight 05/21/21 1831 138 lb 14.2 oz (63 kg)  ?   Height 05/21/21 1831 '5\' 5"'$  (1.651 m)  ?   Head Circumference --   ?   Peak Flow --   ?   Pain Score --   ?   Pain Loc --   ?   Pain Edu? --   ?   Excl. in [place]? --   ? ? ?Most recent vital signs: ?Vitals:  ? 05/22/21 0000 05/22/21 0015  ?BP: (!) 124/51 (!) 124/57  ?Pulse: 73 91  ?Resp: 18 19  ?Temp:    ?SpO2: 100% 96%  ? ? ? ?General: Awake, alert but in intermittent severe pain from the crampy abdominal pain ?CV:  Good peripheral perfusion.   Heart regular rate and rhythm somewhat fast at times ?Resp:  Normal effort.  Lungs are clear ?Abd:  No distention.  Bowel sounds are positive but intermittently clinically.  Mildly diffusely tender ?Extremities some edema ? ? ?ED Results / Procedures / Treatments  ? ?Labs ?(all labs ordered are listed, but only abnormal results are displayed) ?Labs Reviewed  ?COMPREHENSIVE METABOLIC PANEL - Abnormal; Notable for the following components:  ?    Result Value  ? Glucose, Bld 125 (*)   ? BUN 56 (*)   ? Creatinine, Ser 4.36 (*)   ? Calcium 8.3 (*)   ? GFR, Estimated 9 (*)   ? All other components within normal limits  ?LACTIC ACID, PLASMA - Abnormal; Notable for the following components:  ? Lactic Acid, Venous 3.9 (*)   ? All other components within normal limits  ?LACTIC ACID, PLASMA - Abnormal; Notable for the following components:  ? Lactic Acid, Venous 2.8 (*)   ? All other components within normal limits  ?LIPASE, BLOOD - Abnormal; Notable for the following components:  ?  Lipase 89 (*)   ? All other components within normal limits  ?CBC WITH DIFFERENTIAL/PLATELET - Abnormal; Notable for the following components:  ? WBC 12.9 (*)   ? RBC 3.74 (*)   ? MCV 102.1 (*)   ? Lymphs Abs 6.5 (*)   ? Abs Immature Granulocytes 0.08 (*)   ? All other components within normal limits  ?URINALYSIS, ROUTINE W REFLEX MICROSCOPIC - Abnormal; Notable for the following components:  ? Color, Urine YELLOW (*)   ? APPearance HAZY (*)   ? Protein, ur 100 (*)   ? All other components within normal limits  ?LIPASE, BLOOD - Abnormal; Notable for the following components:  ? Lipase 82 (*)   ? All other components within normal limits  ?AMYLASE - Abnormal; Notable for the following components:  ? Amylase 466 (*)   ? All other components within normal limits  ?GLUCOSE, CAPILLARY - Abnormal; Notable for the following components:  ? Glucose-Capillary 211 (*)   ? All other components within normal limits  ?MRSA NEXT GEN BY PCR, NASAL  ?CULTURE,  BLOOD (ROUTINE X 2)  ?CULTURE, BLOOD (ROUTINE X 2)  ?URINE CULTURE  ?BRAIN NATRIURETIC PEPTIDE  ?PROCALCITONIN  ?CORTISOL  ?CBC  ?BASIC METABOLIC PANEL  ?MAGNESIUM  ?PHOSPHORUS  ?PROCALCITONIN  ?TROPONIN I (HIGH SENSITIVITY)  ?TROPONIN I (HIGH SENSITIVITY)  ? ? ? ?EKG ? ?EKG read and interpreted by me shows normal sinus rhythm rate of 68 normal axis no acute changes ? ? ?RADIOLOGY ?Bedside ultrasound was done FAST exam little fluid was seen in the bladder no fluid exterior to the bladder.  There was no fluid on the right side around the liver and kidney.  I did not get a good view of the spleen ? ? ?PROCEDURES: ? ?Critical Care performed: Critical care time 60 minutes.  This includes being with the patient and supervising fluids and then the Levophed.  I discussed the patient with the surgeon and then with the hospitalist as well. ?CT read by radiology films reviewed by me show a large hiatal hernia involving almost all the stomach.  Additionally there is a loop of colon stuck up in the chest with the hiatal hernia.  This is producing at least partial obstruction of the colon.  This is the cause of the patient's problems. ?Procedures ? ? ?MEDICATIONS ORDERED IN ED: ?Medications  ?norepinephrine (LEVOPHED) '4mg'$  in 244m (0.016 mg/mL) premix infusion (4 mcg/min Intravenous Rate/Dose Change 05/22/21 0000)  ?norepinephrine (LEVOPHED) 4-5 MG/250ML-% infusion SOLN (  Not Given 05/21/21 1906)  ?docusate sodium (COLACE) capsule 100 mg (has no administration in time range)  ?polyethylene glycol (MIRALAX / GLYCOLAX) packet 17 g (has no administration in time range)  ?piperacillin-tazobactam (ZOSYN) IVPB 2.25 g (2.25 g Intravenous New Bag/Given 05/21/21 2147)  ?Chlorhexidine Gluconate Cloth 2 % PADS 6 each (6 each Topical Given 05/21/21 2240)  ?lidocaine (XYLOCAINE) 1 % (with pres) injection (has no administration in time range)  ?lactated ringers bolus 1,000 mL (0 mLs Intravenous Stopped 05/21/21 1943)  ?lactated ringers bolus  1,000 mL (0 mLs Intravenous Stopped 05/21/21 1943)  ?lactated ringers bolus 1,000 mL (1,000 mLs Intravenous New Bag/Given 05/21/21 2106)  ? ? ? ?IMPRESSION / MDM / ASSESSMENT AND PLAN / ED COURSE  ?I reviewed the triage vital signs and the nursing notes. ?Patient dehydrated with a high lactic acid.  She has a white count and her GFR has fallen producing AKI.  We gave her over 2 L of fluid with minimal results.  Start her on Levophed this brought the blood pressure of hydronephrosis get the CT scan.  We had the continually titrate the Levophed as at times blood pressure would shoot up and then it would come back down again.  Surgeon did want a NG tube placed to help decompress the stomach.  This was done just about the time the patient was admitted.  Lactic acid came down with fluids but was still elevated.  Procalcitonin was negative.  Troponin was essentially negative.  Urine was clear. ? ? ? ?{The patient is on the cardiac monitor to evaluate for evidence of arrhythmia and/or significant heart rate changes.  None were seen ? ?  ? ? ?FINAL CLINICAL IMPRESSION(S) / ED DIAGNOSES  ? ?Final diagnoses:  ?Generalized abdominal pain  ?Large bowel obstruction (HCC)  ?Dehydration  ?Elevated lactic acid level  ? ? ? ?Rx / DC Orders  ? ?ED Discharge Orders   ? ? None  ? ?  ? ? ? ?Note:  This document was prepared using Dragon voice recognition software and may include unintentional dictation errors. ?  ?Nena Polio, MD ?05/22/21 [phone] ? ?

## 2021-05-21 NOTE — ED Notes (Signed)
Attempted to call report to ICU. Nurse not available to take report at this time. ?

## 2021-05-21 NOTE — ED Triage Notes (Signed)
Abd pain / cramping , near syncopal , hypotensive 60/30 per ems  ?

## 2021-05-21 NOTE — ED Notes (Signed)
Patient continent of one large soft brown BM in bedpan. Incontinent of large loose BM in brief.  ?

## 2021-05-21 NOTE — Progress Notes (Signed)
Came by to see Theresa Snyder, she appears to be sleeping comfortably.   ?Will complete consult in AM.  ?

## 2021-05-21 NOTE — Consult Note (Signed)
Pharmacy Antibiotic Note ? ?Theresa Snyder is a 88 y.o. female admitted on 05/21/2021 with abdominal pain   Pharmacy has been consulted for Zosyn dosing. ? ?Plan: ?Zosyn 2. 25 gram IV Q8H based on current renal function ? ?Height: '5\' 5"'$  (165.1 cm) ?Weight: 63 kg (138 lb 14.2 oz) ?IBW/kg (Calculated) : 57 ? ?Temp (24hrs), Avg:98.5 ?F (36.9 ?C), Min:98.5 ?F (36.9 ?C), Max:98.5 ?F (36.9 ?C) ? ?Recent Labs  ?Lab 05/21/21 ?1835  ?WBC 12.9*  ?CREATININE 4.36*  ?LATICACIDVEN 3.9*  ?  ?Estimated Creatinine Clearance: 7.9 mL/min (A) (by C-G formula based on SCr of 4.36 mg/dL (H)).   ? ?Allergies  ?Allergen Reactions  ? Iodinated Contrast Media Other (See Comments)  ?  Blue, Green and Red dyes   ? Lorazepam   ?  Altered mental state  ? Risperidone Other (See Comments)  ?  Altered mental state   ? Sulfamethoxazole-Trimethoprim   ?  Hyper  ? ? ?Antimicrobials this admission: ?4/12 Zosyn >>  ? ?Dose adjustments this admission: ? ? ?Microbiology results: ?4/12 BCx: pending ?4/12 UCx: pending  ? ?Thank you for allowing pharmacy to be a part of this patient?s care. ? ?Dorothe Pea, PharmD, BCPS ?Clinical Pharmacist   ?05/21/2021 8:51 PM ? ?

## 2021-05-21 NOTE — Progress Notes (Signed)
Received report at this time, with no previous call since patient was assigned to me @ 2144. Awaiting arrival of patient.  ?

## 2021-05-21 NOTE — Consult Note (Incomplete)
[facility] ?SURGICAL CONSULTATION NOTE (initial) - cpt: 9925***1/2/3/4/5 or 9924***1/2/3/4/5 (Outpatient/ED) ? ? ?HISTORY OF PRESENT ILLNESS (HPI):  ?89 y.o. female presented to [facility] ED ***today for evaluation of ***. Patient reports *** ? ?Surgery is consulted by *** physician Dr. Marland Kitchen in this context for evaluation and management*** of ***. ? ?PAST MEDICAL HISTORY (PMH):  ?Past Medical History:  ?Diagnosis Date  ? Arthritis   ? Cancer [facility])   ? skin  ? Chronic kidney disease   ? history of renal insufficiency  ? History of hiatal hernia   ? Hypercholesteremia   ? Hypertension   ? Hypothyroidism   ? Lymphedema of leg   ? Pneumonia   ? in the past  ? Seizures ([place]) 2014  ? no seizures since then  ? Spinal stenosis   ?  ? ?PAST SURGICAL HISTORY ([place]):  ?Past Surgical History:  ?Procedure Laterality Date  ? APPENDECTOMY    ? CATARACT EXTRACTION W/PHACO Left 09/25/2015  ? Procedure: CATARACT EXTRACTION PHACO AND INTRAOCULAR LENS PLACEMENT (IOC);  Surgeon: Estill Cotta, MD;  Location: [facility] ORS;  Service: Ophthalmology;  Laterality: Left;  [place] 01:23 ?AP% 24.0 ?CDE 34.98 ?Fluid pack lot # [id number]  ? CATARACT EXTRACTION W/PHACO Right 07/01/2016  ? Procedure: CATARACT EXTRACTION PHACO AND INTRAOCULAR LENS PLACEMENT (IOC);  Surgeon: Estill Cotta, MD;  Location: [facility] ORS;  Service: Ophthalmology;  Laterality: Right;  [place] 1:25.9 ?AP% 24.8 ?CDE 40.61 ?Fluid Pack Lot # [id number]  ? TUBAL LIGATION    ?  ? ?MEDICATIONS:  ?Prior to Admission medications   ?Medication Sig Start Date End Date Taking? Authorizing Provider  ?amLODipine (NORVASC) 10 MG tablet Take 10 mg by mouth daily with breakfast. for high blood pressure 01/12/15   [provider]  ?benazepril (LOTENSIN) 20 MG tablet Take 20 mg by mouth daily. 01/12/15   [provider]  ?gabapentin (NEURONTIN) 300 MG capsule Take 300 mg by mouth daily. 01/12/15   [provider]  ?hydrochlorothiazide (HYDRODIURIL) 25 MG tablet Take 25  mg by mouth daily. 08/15/12   [provider]  ?levothyroxine (SYNTHROID, LEVOTHROID) 88 MCG tablet Take 88 mcg by mouth daily before breakfast. 01/12/15   [provider]  ?meclizine (ANTIVERT) 25 MG tablet Take 12.5-25 mg by mouth 3 (three) times daily as needed for dizziness or nausea. 02/14/15   [provider]  ?pravastatin (PRAVACHOL) 20 MG tablet Take 20 mg by mouth at bedtime.     [provider]  ?  ? ?ALLERGIES:  ?Allergies  ?Allergen Reactions  ? Iodinated Contrast Media Other (See Comments)  ?  Blue, Green and Red dyes   ? Lorazepam   ?  Altered mental state  ? Risperidone Other (See Comments)  ?  Altered mental state   ? Sulfamethoxazole-Trimethoprim   ?  Hyper  ?  ? ?SOCIAL HISTORY:  ?Social History  ? ?Socioeconomic History  ? Marital status: Divorced  ?  Spouse name: Not on file  ? Number of children: Not on file  ? Years of education: Not on file  ? Highest education level: Not on file  ?Occupational History  ? Not on file  ?Tobacco Use  ? Smoking status: Never  ? Smokeless tobacco: Never  ?Substance and Sexual Activity  ? Alcohol use: No  ?  Alcohol/week: 0.0 standard drinks  ? Drug use: No  ? Sexual activity: Not on file  ?Other Topics Concern  ? Not on file  ?Social History Narrative  ? Not on  file  ? ?Social Determinants of Health  ? ?Financial Resource Strain: Not on file  ?Food Insecurity: Not on file  ?Transportation Needs: Not on file  ?Physical Activity: Not on file  ?Stress: Not on file  ?Social Connections: Not on file  ?Intimate Partner Violence: Not on file  ?  ? ?FAMILY HISTORY:  ?No family history on file.  ? ? ?REVIEW OF SYSTEMS:  ?ROS ? ?VITAL SIGNS:  ?Temp:  [98.5 ?F (36.9 ?C)] 98.5 ?F (36.9 ?C) (04/12 1830) ?Pulse Rate:  [65-95] 95 (04/12 2115) ?Resp:  [17-28] 23 (04/12 2115) ?BP: (40-217)/(20-183) 113/86 (04/12 2115) ?SpO2:  [78 %-96 %] 95 % (04/12 2115) ?Weight:  [63 kg] 63 kg (04/12 1831)     Height: '5\' 5"'$  (165.1 cm) Weight: 63 kg BMI  (Calculated): 23.11  ? ?INTAKE/OUTPUT:  ?No intake/output data recorded. ? ?PHYSICAL EXAM:  ?Physical Exam ?Blood pressure 113/86, pulse 95, temperature 98.5 ?F (36.9 ?C), temperature source Oral, resp. rate (!) 23, height '5\' 5"'$  (1.651 m), weight 63 kg, SpO2 95 %. ?Last Weight  Most recent update: 05/21/2021  6:32 PM  ? ? Weight  ?63 kg (138 lb 14.2 oz)  ?      ? ?  ? ? ?CONSTITUTIONAL: Well developed, and nourished, appropriately responsive and aware without distress. ***  ?EYES: Sclera non-icteric.   ?EARS, NOSE, MOUTH AND THROAT: Mask worn.  *** The oropharynx is clear. Oral mucosa is pink and moist.  Dentition: ***   Hearing is intact to voice.  ?NECK: Trachea is midline, and there is no jugular venous distension.  ?LYMPH NODES:  Lymph nodes in the neck are not enlarged. ?RESPIRATORY:  Lungs are clear, and breath sounds are equal bilaterally. Normal respiratory effort without pathologic use of accessory muscles. ?CARDIOVASCULAR: Heart is regular in rate and rhythm. ?GI: The abdomen is *** soft, nontender, and nondistended. There were no palpable masses. I did not appreciate hepatosplenomegaly. There were normal bowel sounds. ?GU: *** ?MUSCULOSKELETAL:  Symmetrical muscle tone appreciated in all four extremities.    ?SKIN: Skin turgor is normal. No pathologic skin lesions appreciated.  ?NEUROLOGIC:  Motor and sensation appear grossly normal.  Cranial nerves are grossly without defect. ?PSYCH:  Alert and oriented to person, place and time. Affect is appropriate for situation. ? ?Data Reviewed ?I have personally reviewed what is currently available of the patient's imaging, recent labs and medical records.   ? ?Labs:  ? ?  Latest Ref Rng & Units 05/21/2021  ?  6:35 PM 07/09/2020  ? 12:08 PM 03/29/2020  ?  5:04 AM  ?CBC  ?WBC 4.0 - 10.5 K/uL 12.9   10.4   12.2    ?Hemoglobin 12.0 - 15.0 g/dL 12.4   13.0   13.3    ?Hematocrit 36.0 - 46.0 % 38.2   37.6   38.9    ?Platelets 150 - 400 K/uL 273   273   354    ? ? ?  Latest  Ref Rng & Units 05/21/2021  ?  6:35 PM 07/09/2020  ? 12:08 PM 03/26/2020  ?  4:59 AM  ?CMP  ?Glucose 70 - 99 mg/dL 125   149   114    ?BUN 8 - 23 mg/dL 56   17   20    ?Creatinine 0.44 - 1.00 mg/dL 4.36   1.52   1.39    ?Sodium 135 - 145 mmol/L 136   139   135    ?Potassium 3.5 - 5.1 mmol/L  3.9   3.6   4.2    ?Chloride 98 - 111 mmol/L 101   104   104    ?CO2 22 - 32 mmol/L '22   21   24    '$ ?Calcium 8.9 - 10.3 mg/dL 8.3   9.2   8.8    ?Total Protein 6.5 - 8.1 g/dL 7.4   7.3   6.2    ?Total Bilirubin 0.3 - 1.2 mg/dL 0.6   1.2   0.6    ?Alkaline Phos 38 - 126 U/L 67   75   78    ?AST 15 - 41 U/L '24   28   18    '$ ?ALT 0 - 44 U/L '11   12   10    '$ ? ? ? ?Imaging studies:  ?{Labs :18171} ?Last 24 hrs: CT ABDOMEN PELVIS WO CONTRAST ? ?Result Date: 05/21/2021 ?CLINICAL DATA:  Acute abdominal pain and hypotension EXAM: CT ABDOMEN AND PELVIS WITHOUT CONTRAST TECHNIQUE: Multidetector CT imaging of the abdomen and pelvis was performed following the standard protocol without IV contrast. RADIATION DOSE REDUCTION: This exam was performed according to the departmental dose-optimization program which includes automated exposure control, adjustment of the mA and/or kV according to patient size and/or use of iterative reconstruction technique. COMPARISON:  03/23/2020 FINDINGS: Lower chest: Lung bases demonstrate no focal infiltrate. Large hiatal hernia is noted with almost the entire stomach within the chest cavity. Hepatobiliary: No focal liver abnormality is seen. No gallstones, gallbladder wall thickening, or biliary dilatation. Pancreas: Unremarkable. No pancreatic ductal dilatation or surrounding inflammatory changes. Spleen: Normal in size without focal abnormality. Adrenals/Urinary Tract: Adrenal glands are within normal limits. Kidneys show no renal calculi. A few hypodensities are noted within the kidneys consistent with small cysts. These are stable from prior exam require no further follow-up. The bladder is decompressed.  Stomach/Bowel: Scattered diverticular change of the colon is noted without evidence of diverticulitis. The distal transverse colon extends into the hiatal hernia as well. The colon proximal to this is distended with fluid

## 2021-05-21 NOTE — H&P (Addendum)
? ? ?NAME:  Theresa Snyder, MRN:  [id number], DOB:  24-Jan-1930, LOS: 0 ?ADMISSION DATE:  05/21/2021, CONSULTATION DATE:  05/21/2021 ?REFERRING MD:  Conni Slipper MD  CHIEF COMPLAINT: Near syncope  ? ? ?HPI  ?88 y.o with significant PMH of CKD stage III, Lumbar  spinal stenosis, L4 compression fracture,  Hypertension, HLD, Hypothyroidism, chronic constipation, and hiatal hernia who presented to the ED with chief complaints of abdominal pain/cramping and near syncopal episode. ? ?Patients daughter who is currently at the bedside report that patient has been complaining of vague, crampy, lower abdominal pain that had been present for approximately 3 days. She had mild nausea and anorexia, but she denied any emesis. The patient also reported she had not had a bowel movement in 3 days and did not recall passing flatus in the past 24 hours. Pain worsened today  with near syncopal episode prompting her to call EMS. On EMS arrival, patient was found to be hypotensive with BP of 60/30. ? ?ED Course: In the emergency department, the temperature was 36.9?C, the heart rate 72 beats/minute, the blood pressure 73/48 mm Hg, the respiratory rate 17 breaths/minute, and the oxygen saturation 96% on 2L. ?Pertinent Labs in Red/Diagnostics Findings: ?Na+/ K+: 136/3.9 ?Glucose: 125 ?BUN/Cr.:56/4.36 ? ?WBC/ TMAX:12.9 / afebrile ?Hgb/Hct: 12.4/38.2 ?PCT: negative <0.10 ?Lactic acid: 3.9>2.8 ?COVID PCR: Negative ? ?CT abdomen pelvis was obtained and showed large hiatal hernia with almost the entire stomach within the hernia causing  a partial colonic obstruction with dilatation and diffuse fluid within the more proximal colon. Patient given 2 L of fluids and started on Levophed due to persistent hypotensive despite IVF boluses. General surgery consulted who recommended NGT for decompression. PCCM consulted. ? ?Past Medical History  ?CKD stage III, Lumbar  spinal stenosis, L4 compression fracture,  Hypertension, HLD, Hypothyroidism, chronic  constipation, and hiatal hernia  ? ?Significant Hospital Events   ?4/12; Admitted to ICU with large bowel obstruction with hypotension requiring pressor. ? ?Consults:  ?General surgery ? ?Procedures:  ?4/13: Right radial art Line ? ?Significant Diagnostic Tests:  ?4/12: CTA abdomen and pelvis>Large hiatal hernia with almost the entire stomach within the hernia causing  a partial colonic obstruction with dilatation and diffuse fluid within the more proximal colon ? ?Micro Data:  ?4/12: SARS-CoV-2 PCR> negative ?4/12: Influenza PCR> negative ?4/12: Blood culture x2> ?4/12: Urine Culture> ?4/12: MRSA PCR>>  ? ?Antimicrobials:  ?Zosyn 4/12> ? ?OBJECTIVE  ?Blood pressure 100/64, pulse 95, temperature 97.7 ?F (36.5 ?C), temperature source Oral, resp. rate (!) 28, height '5\' 5"'$  (1.651 m), weight 56.5 kg, SpO2 90 %. ?   ?   ? ?Intake/Output Summary (Last 24 hours) at 05/21/2021 2249 ?Last data filed at 05/21/2021 1943 ?Gross per 24 hour  ?Intake 2000 ml  ?Output --  ?Net 2000 ml  ? ?Filed Weights  ? 05/21/21 1831 05/21/21 2240  ?Weight: 63 kg 56.5 kg  ? ? ?Physical Examination  ?GENERAL:88 year-old critically ill patient lying in the bed with no acute distress.  ?EYES: Pupils equal, round, reactive to light and accommodation. No scleral icterus. Extraocular muscles intact.  ?HEENT: Head atraumatic, normocephalic. Oropharynx and nasopharynx clear.  ?NECK:  Supple, no jugular venous distention. No thyroid enlargement, no tenderness.  ?LUNGS: Normal breath sounds bilaterally, no wheezing, rales,rhonchi or crepitation. No use of accessory muscles of respiration.  ?CARDIOVASCULAR: S1, S2 normal. No murmurs, rubs, or gallops.  ?ABDOMEN: Soft, nontender, nondistended. Bowel sounds present. No organomegaly or mass.  ?EXTREMITIES: No pedal edema, cyanosis,  or clubbing.  ?NEUROLOGIC: Cranial nerves II through XII are intact.  Muscle strength 5/5 in all extremities. Sensation intact. Gait not checked.  ?PSYCHIATRIC: The patient is alert  and oriented x 3.  ?SKIN: No obvious rash, lesion, or ulcer.  ? ?Labs/imaging that I havepersonally reviewed  ?(right click and "Reselect all SmartList Selections" daily)  ? ?  ?Labs   ?CBC: ?Recent Labs  ?Lab 05/21/21 ?1835  ?WBC 12.9*  ?NEUTROABS 5.4  ?HGB 12.4  ?HCT 38.2  ?MCV 102.1*  ?PLT 273  ? ? ?Basic Metabolic Panel: ?Recent Labs  ?Lab 05/21/21 ?1835  ?NA 136  ?K 3.9  ?CL 101  ?CO2 22  ?GLUCOSE 125*  ?BUN 56*  ?CREATININE 4.36*  ?CALCIUM 8.3*  ? ?GFR: ?Estimated Creatinine Clearance: 7.8 mL/min (A) (by C-G formula based on SCr of 4.36 mg/dL (H)). ?Recent Labs  ?Lab 05/21/21 ?1835 05/21/21 ?2054  ?PROCALCITON  --  <0.10  ?WBC 12.9*  --   ?LATICACIDVEN 3.9* 2.8*  ? ? ?Liver Function Tests: ?Recent Labs  ?Lab 05/21/21 ?1835  ?AST 24  ?ALT 11  ?ALKPHOS 67  ?BILITOT 0.6  ?PROT 7.4  ?ALBUMIN 4.1  ? ?Recent Labs  ?Lab 05/21/21 ?1835 05/21/21 ?2054  ?LIPASE 89* 82*  ?AMYLASE  --  466*  ? ?No results for input(s): AMMONIA in the last 168 hours. ? ?ABG ?No results found for: PHART, PCO2ART, PO2ART, HCO3, TCO2, ACIDBASEDEF, O2SAT  ? ?Coagulation Profile: ?No results for input(s): INR, PROTIME in the last 168 hours. ? ?Cardiac Enzymes: ?No results for input(s): CKTOTAL, CKMB, CKMBINDEX, TROPONINI in the last 168 hours. ? ?HbA1C: ?Hemoglobin A1C  ?Date/Time Value Ref Range Status  ?10/10/2012 03:36 AM 6.7 (H) 4.2 - 6.3 % Final  ?  Comment:  ?  The American Diabetes Association recommends that a primary goal of ?therapy should be <7% and that physicians should reevaluate the ?treatment regimen in patients with HbA1c values consistently >8%. ?  ? ? ?CBG: ?Recent Labs  ?Lab 05/21/21 ?2238  ?GLUCAP 211*  ? ? ?Review of Systems:   ?Review of Systems  ?Constitutional:  Positive for chills, fever, malaise/fatigue and weight loss.  ?HENT:  Positive for hearing loss.   ?Eyes: Negative.   ?Respiratory: Negative.    ?Cardiovascular: Negative.   ?Gastrointestinal:  Positive for abdominal pain, constipation and nausea.   ?Musculoskeletal:  Positive for back pain and falls.  ?Skin: Negative.   ?Neurological:  Positive for dizziness.  ?Psychiatric/Behavioral: Negative.    ? ?Past Medical History  ?She,  has a past medical history of Arthritis, Cancer ([place]), Chronic kidney disease, History of hiatal hernia, Hypercholesteremia, Hypertension, Hypothyroidism, Lymphedema of leg, Pneumonia, Seizures ([place]) (2014), and Spinal stenosis.  ? ?Surgical History   ? ?Past Surgical History:  ?Procedure Laterality Date  ? APPENDECTOMY    ? CATARACT EXTRACTION W/PHACO Left 09/25/2015  ? Procedure: CATARACT EXTRACTION PHACO AND INTRAOCULAR LENS PLACEMENT (IOC);  Surgeon: Estill Cotta, MD;  Location: [facility] ORS;  Service: Ophthalmology;  Laterality: Left;  [place] 01:23 ?AP% 24.0 ?CDE 34.98 ?Fluid pack lot # [id number]  ? CATARACT EXTRACTION W/PHACO Right 07/01/2016  ? Procedure: CATARACT EXTRACTION PHACO AND INTRAOCULAR LENS PLACEMENT (IOC);  Surgeon: Estill Cotta, MD;  Location: [facility] ORS;  Service: Ophthalmology;  Laterality: Right;  [place] 1:25.9 ?AP% 24.8 ?CDE 40.61 ?Fluid Pack Lot # [id number]  ? TUBAL LIGATION    ?  ? ?Social History  ? reports that she has never smoked. She has never used smokeless tobacco. She reports that she  does not drink alcohol and does not use drugs.  ? ?Family History   ?Her family history is not on file.  ? ?Allergies ?Allergies  ?Allergen Reactions  ? Iodinated Contrast Media Other (See Comments)  ?  Blue, Green and Red dyes   ? Lorazepam   ?  Altered mental state  ? Risperidone Other (See Comments)  ?  Altered mental state   ? Sulfamethoxazole-Trimethoprim   ?  Hyper  ?  ? ?Home Medications  ?Prior to Admission medications   ?Medication Sig Start Date End Date Taking? Authorizing Provider  ?amLODipine (NORVASC) 10 MG tablet Take 10 mg by mouth daily with breakfast. for high blood pressure 01/12/15  Yes [provider]  ?benazepril (LOTENSIN) 20 MG tablet Take 20 mg by mouth daily. 01/12/15  Yes [provider]  ?gabapentin (NEURONTIN) 300 MG capsule Take 300 mg by mouth daily. 01/12/15  Yes [provider]  ?hydrochlorothiazide (HYDRODIURIL) 25 MG tablet Take 25 mg by mouth daily. 08/15/12  Yes Provider, Historic

## 2021-05-22 ENCOUNTER — Other Ambulatory Visit: Payer: Self-pay

## 2021-05-22 DIAGNOSIS — K449 Diaphragmatic hernia without obstruction or gangrene: Secondary | ICD-10-CM | POA: Diagnosis not present

## 2021-05-22 DIAGNOSIS — K56609 Unspecified intestinal obstruction, unspecified as to partial versus complete obstruction: Secondary | ICD-10-CM

## 2021-05-22 LAB — BASIC METABOLIC PANEL
Anion gap: 7 (ref 5–15)
BUN: 48 mg/dL — ABNORMAL HIGH (ref 8–23)
CO2: 20 mmol/L — ABNORMAL LOW (ref 22–32)
Calcium: 7.4 mg/dL — ABNORMAL LOW (ref 8.9–10.3)
Chloride: 108 mmol/L (ref 98–111)
Creatinine, Ser: 2.96 mg/dL — ABNORMAL HIGH (ref 0.44–1.00)
GFR, Estimated: 15 mL/min — ABNORMAL LOW (ref >60)
Glucose, Bld: 141 mg/dL — ABNORMAL HIGH (ref 70–99)
Potassium: 4.2 mmol/L (ref 3.5–5.1)
Sodium: 135 mmol/L (ref 135–145)

## 2021-05-22 LAB — CBC
HCT: 32.4 % — ABNORMAL LOW (ref 36.0–46.0)
Hemoglobin: 10.8 g/dL — ABNORMAL LOW (ref 12.0–15.0)
MCH: 32.9 pg (ref 26.0–34.0)
MCHC: 33.3 g/dL (ref 30.0–36.0)
MCV: 98.8 fL (ref 80.0–100.0)
Platelets: 247 10*3/uL (ref 150–400)
RBC: 3.28 MIL/uL — ABNORMAL LOW (ref 3.87–5.11)
RDW: 12.7 % (ref 11.5–15.5)
WBC: 19 10*3/uL — ABNORMAL HIGH (ref 4.0–10.5)
nRBC: 0 % (ref 0.0–0.2)

## 2021-05-22 LAB — CORTISOL: Cortisol, Plasma: 29.2 ug/dL

## 2021-05-22 LAB — PROCALCITONIN: Procalcitonin: 0.51 ng/mL

## 2021-05-22 LAB — MRSA NEXT GEN BY PCR, NASAL: MRSA by PCR Next Gen: NOT DETECTED

## 2021-05-22 LAB — LACTIC ACID, PLASMA: Lactic Acid, Venous: 1.4 mmol/L (ref 0.5–1.9)

## 2021-05-22 LAB — MAGNESIUM: Magnesium: 1.3 mg/dL — ABNORMAL LOW (ref 1.7–2.4)

## 2021-05-22 LAB — PHOSPHORUS: Phosphorus: 3.9 mg/dL (ref 2.5–4.6)

## 2021-05-22 MED ORDER — PIPERACILLIN-TAZOBACTAM IN DEX 2-0.25 GM/50ML IV SOLN: 2.2500 g | Freq: Three times a day (TID) | INTRAVENOUS | Status: DC

## 2021-05-22 MED ORDER — PIPERACILLIN-TAZOBACTAM IN DEX 2-0.25 GM/50ML IV SOLN
2.2500 g | Freq: Three times a day (TID) | INTRAVENOUS | Status: DC
  Administered 2021-05-22 – 2021-05-24 (×6): 2.25 g via INTRAVENOUS
  Filled 2021-05-22 (×7): qty 50

## 2021-05-22 MED ORDER — POLYETHYLENE GLYCOL 3350 17 G PO PACK
17.0000 g | PACK | Freq: Two times a day (BID) | ORAL | Status: DC
  Administered 2021-05-22 (×2): 17 g via NASOGASTRIC
  Filled 2021-05-22 (×3): qty 1

## 2021-05-22 MED ORDER — MAGNESIUM SULFATE 2 GM/50ML IV SOLN
2.0000 g | Freq: Once | INTRAVENOUS | Status: AC
  Administered 2021-05-22: 2 g via INTRAVENOUS
  Filled 2021-05-22: qty 50

## 2021-05-22 MED ORDER — LIDOCAINE HCL 1 % IJ SOLN
INTRAMUSCULAR | Status: AC
  Filled 2021-05-22: qty 10

## 2021-05-22 MED ORDER — PIPERACILLIN-TAZOBACTAM 3.375 G IVPB: 3.3750 g | Freq: Three times a day (TID) | INTRAVENOUS | Status: DC

## 2021-05-22 MED ORDER — LEVOTHYROXINE SODIUM 88 MCG PO TABS
88.0000 ug | ORAL_TABLET | Freq: Every day | ORAL | Status: DC
  Administered 2021-05-23: 88 ug
  Filled 2021-05-22: qty 1

## 2021-05-22 NOTE — Progress Notes (Signed)
PHARMACY CONSULT NOTE - FOLLOW UP ? ?Pharmacy Consult for Electrolyte Monitoring and Replacement  ? ?Recent Labs: ?Potassium (mmol/L)  ?Date Value  ?05/22/2021 4.2  ?11/21/2012 4.0  ? ?Magnesium (mg/dL)  ?Date Value  ?05/22/2021 1.3 (L)  ?11/05/2012 3.3 (H)  ? ?Calcium (mg/dL)  ?Date Value  ?05/22/2021 7.4 (L)  ? ?Calcium, Total (mg/dL)  ?Date Value  ?11/21/2012 9.4  ? ?Albumin (g/dL)  ?Date Value  ?05/21/2021 4.1  ?11/21/2012 3.5  ? ?Phosphorus (mg/dL)  ?Date Value  ?05/22/2021 3.9  ? ?Sodium (mmol/L)  ?Date Value  ?05/22/2021 135  ?11/21/2012 135 (L)  ?Scr 4.36 > 2.96 (BL 1.3) ? ?Assessment: ?88 year old female presenting with sepsis and intra-abdominal infection. In AKI with Scr significantly above baseline. ? ?Received 3 L of LR bolus. ? ?Goal of Therapy:  ?Electrolytes within normal limits ? ?Plan:  ?Magnesium 2 grams x 1 ?Follow up AM labs ? ?Wynelle Cleveland, PharmD ?Pharmacy Resident  ?05/22/2021 ?9:24 AM ?

## 2021-05-22 NOTE — Progress Notes (Signed)
? ?CRITICAL CARE PROGRESS NOTE ? ? ? ?Name: Theresa Snyder ?MRN: [id number] ?DOB: 1929-06-29 ? ?   ?LOS: 1 ? ? ?SUBJECTIVE FINDINGS & SIGNIFICANT EVENTS  ? ? ?Patient description:  ?presented to the ED with chief complaints of abdominal pain/cramping and near syncopal episode. ?  ?Patients daughter who is currently at the bedside report that patient has been complaining of vague, crampy, lower abdominal pain that had been present for approximately 3 days. She had mild nausea and anorexia, but she denied any emesis. The patient also reported she had not had a bowel movement in 3 days and did not recall passing flatus in the past 24 hours. Pain worsened today  with near syncopal episode prompting her to call EMS. On EMS arrival, patient was found to be hypotensive with BP of 60/30. ?  ?05/22/21- patient is on 75mg levophed, reports throat dryness and horseness, mentating well, NGT suction aspiration active. Hypoactive BSx4 ? ?Lines/tubes ?: ?Arterial Line 05/22/21 Right Radial (Active)  ?Site Assessment Clean, Dry, Intact 05/22/21 0814  ?Line Status Pulsatile blood flow 05/22/21 0814  ?Art Line Waveform Appropriate 05/22/21 [phone] ?Art Line Interventions Zeroed and calibrated;Connections checked and tightened;Flushed per protocol 05/22/21 0814  ?Color/Movement/Sensation Capillary refill less than 3 sec 05/22/21 0814  ?Dressing Type Transparent 05/22/21 0814  ?Dressing Status Clean, Dry, Intact 05/22/21 0814  ?Dressing Change Due 05/29/21 05/22/21 0814  ?   ?NG/OG Vented/Dual Lumen 16 Fr. Right nare 50 cm (Active)  ?Tube Position (Required) Marking at nare/corner of mouth 05/22/21 0814  ?Measurement (cm) (Required) 50 cm 05/22/21 0814  ?Ongoing Placement Verification (Required) (See row information) Yes 05/22/21 0814  ?Site Assessment Clean, Dry,  Intact 05/22/21 0814  ?Interventions LEugenio Hoesvalve changed 05/21/21 2300  ?Status Low intermittent suction 05/22/21 0814  ?Amount of suction 80 mmHg 05/22/21 0000  ? ? ?Microbiology/Sepsis markers: ?Results for orders placed or performed during the hospital encounter of 05/21/21  ?MRSA Next Gen by PCR, Nasal     Status: None  ? Collection Time: 05/21/21 10:39 PM  ? Specimen: Nasal Mucosa; Nasal Swab  ?Result Value Ref Range Status  ? MRSA by PCR Next Gen NOT DETECTED NOT DETECTED Final  ?  Comment: (NOTE) ?The GeneXpert MRSA Assay (FDA approved for NASAL specimens only), ?is one component of a comprehensive MRSA colonization surveillance ?program. It is not intended to diagnose MRSA infection nor to guide ?or monitor treatment for MRSA infections. ?Test performance is not FDA approved in patients less than 2 years ?old. ?[facility] [address] [id number]?  ? ? ?Anti-infectives:  ?Anti-infectives (From admission, onward)  ? ? Start     Dose/Rate Route Frequency Ordered Stop  ? 05/22/21 1400  piperacillin-tazobactam (ZOSYN) IVPB 3.375 g       ? 3.375 g ?12.5 mL/hr over 240 Minutes Intravenous Every 8 hours 05/22/21 0918    ? 05/21/21 2200  piperacillin-tazobactam (ZOSYN) IVPB 2.25 g  Status:  Discontinued       ? 2.25 g ?100 mL/hr over 30 Minutes Intravenous Every 8 hours 05/21/21 2051 05/22/21 0918  ? ?  ? ? ? ?Consults: ?Treatment Team:  ?RRonny Bacon MD ?LAnthonette Legato MD  ? ? ? ?PAST MEDICAL HISTORY  ? ?Past Medical History:  ?Diagnosis Date  ? Arthritis   ? Cancer ([facility]   ? skin  ? Chronic kidney disease   ? history of renal insufficiency  ? History of hiatal hernia   ? Hypercholesteremia   ?  Hypertension   ? Hypothyroidism   ? Lymphedema of leg   ? Pneumonia   ? in the past  ? Seizures ([place]) 2014  ? no seizures since then  ? Spinal stenosis   ? ? ? ?SURGICAL HISTORY  ? ?Past Surgical History:  ?Procedure Laterality Date  ? APPENDECTOMY    ? CATARACT EXTRACTION  W/PHACO Left 09/25/2015  ? Procedure: CATARACT EXTRACTION PHACO AND INTRAOCULAR LENS PLACEMENT (IOC);  Surgeon: Estill Cotta, MD;  Location: [facility] ORS;  Service: Ophthalmology;  Laterality: Left;  [place] 01:23 ?AP% 24.0 ?CDE 34.98 ?Fluid pack lot # [id number]  ? CATARACT EXTRACTION W/PHACO Right 07/01/2016  ? Procedure: CATARACT EXTRACTION PHACO AND INTRAOCULAR LENS PLACEMENT (IOC);  Surgeon: Estill Cotta, MD;  Location: [facility] ORS;  Service: Ophthalmology;  Laterality: Right;  [place] 1:25.9 ?AP% 24.8 ?CDE 40.61 ?Fluid Pack Lot # [id number]  ? TUBAL LIGATION    ? ? ? ?FAMILY HISTORY  ? ?No family history on file. ? ? ?SOCIAL HISTORY  ? ?Social History  ? ?Tobacco Use  ? Smoking status: Never  ? Smokeless tobacco: Never  ?Substance Use Topics  ? Alcohol use: No  ?  Alcohol/week: 0.0 standard drinks  ? Drug use: No  ? ? ? ?MEDICATIONS  ? ?Current Medication: ? ?Current Facility-Administered Medications:  ?  Chlorhexidine Gluconate Cloth 2 % PADS 6 each, 6 each, Topical, Q0600, Lang Snow, NP, 6 each at 05/22/21 [phone] ?  docusate sodium (COLACE) capsule 100 mg, 100 mg, Oral, BID PRN, Lang Snow, NP ?  lidocaine (XYLOCAINE) 1 % (with pres) injection, , , ,  ?  magnesium sulfate IVPB 2 g 50 mL, 2 g, Intravenous, Once, Chappell, Alex B, RPH ?  norepinephrine (LEVOPHED) '4mg'$  in 242m (0.016 mg/mL) premix infusion, 0-40 mcg/min, Intravenous, Continuous, MNena Polio MD, Last Rate: 7.5 mL/hr at 05/22/21 0827, 2 mcg/min at 05/22/21 0827 ?  piperacillin-tazobactam (ZOSYN) IVPB 3.375 g, 3.375 g, Intravenous, Q8H, CWynelle Cleveland RPH ?  polyethylene glycol (MIRALAX / GLYCOLAX) packet 17 g, 17 g, Per NG tube, BID, RRonny Bacon MD ? ? ? ?ALLERGIES  ? ?Iodinated contrast media, Lorazepam, Risperidone, and Sulfamethoxazole-trimethoprim ? ? ? ?REVIEW OF SYSTEMS  ? ? ? ?10 point ROS negative except as per HPI ? ?PHYSICAL EXAMINATION  ? ?Vital Signs: ?Temp:  [97.5 ?F (36.4 ?C)-98.5 ?F (36.9 ?C)] 97.5 ?F  (36.4 ?C) (04/13 09983 ?Pulse Rate:  [63-95] 67 (04/13 0800) ?Resp:  [17-28] 17 (04/13 0800) ?BP: (40-217)/(20-183) 122/65 (04/13 03825 ?SpO2:  [78 %-100 %] 100 % (04/13 0800) ?Arterial Line BP: (80-128)/(40-84) 121/43 (04/13 0800) ?Weight:  [56.5 kg-63 kg] 56.5 kg (04/13 0500) ?GENERAL:88year-old critically ill patient lying in the bed with no acute distress.  ?EYES: Pupils equal, round, reactive to light and accommodation. No scleral icterus. Extraocular muscles intact.  ?HEENT: Head atraumatic, normocephalic. Oropharynx and nasopharynx clear.  ?NECK:  Supple, no jugular venous distention. No thyroid enlargement, no tenderness.  ?LUNGS: Normal breath sounds bilaterally, no wheezing, rales,rhonchi or crepitation. No use of accessory muscles of respiration.  ?CARDIOVASCULAR: S1, S2 normal. No murmurs, rubs, or gallops.  ?ABDOMEN: Soft, nontender, nondistended. Bowel sounds present hypoactive. No organomegaly or mass.  ?EXTREMITIES: No pedal edema, cyanosis, or clubbing.  ?NEUROLOGIC: Cranial nerves II through XII are intact.  Muscle strength 5/5 in all extremities. Sensation intact. Gait not checked.  ?PSYCHIATRIC: The patient is alert and oriented x 3.  ?SKIN: No obvious rash, lesion, or ulcer.  ?  ?PERTINENT DATA  ? ? ? ?  Infusions: ? magnesium sulfate bolus IVPB    ? norepinephrine (LEVOPHED) Adult infusion 2 mcg/min (05/22/21 0827)  ? piperacillin-tazobactam (ZOSYN)  IV    ? ?Scheduled Medications: ? Chlorhexidine Gluconate Cloth  6 each Topical Q0600  ? lidocaine      ? polyethylene glycol  17 g Per NG tube BID  ? ?PRN Medications: ?docusate sodium ?Hemodynamic parameters: ?  ?Intake/Output: ?04/12 0701 - 04/13 0700 ?In: 2221.7 [I.V.:221.7; IV [id number]:[id number]] ?Out: -   ?Ventilator  Settings: ?  ? ? ?LAB RESULTS: ? ?Basic Metabolic Panel: ?Recent Labs  ?Lab 05/21/21 ?1835 05/22/21 ?0601  ?NA 136 135  ?K 3.9 4.2  ?CL 101 108  ?CO2 22 20*  ?GLUCOSE 125* 141*  ?BUN 56* 48*  ?CREATININE 4.36* 2.96*  ?CALCIUM 8.3*  7.4*  ?MG  --  1.3*  ?PHOS  --  3.9  ? ?Liver Function Tests: ?Recent Labs  ?Lab 05/21/21 ?1835  ?AST 24  ?ALT 11  ?ALKPHOS 67  ?BILITOT 0.6  ?PROT 7.4  ?ALBUMIN 4.1  ? ?Recent Labs  ?Lab 05/21/21 ?1835 04/1

## 2021-05-22 NOTE — TOC Initial Note (Signed)
Transition of Care (TOC) - Initial/Assessment Note  ? ? ?Patient Details  ?Name: Theresa Snyder ?MRN: [id number] ?Date of Birth: 02/26/1929 ? ?Transition of Care (TOC) CM/SW Contact:    ?Shelbie Hutching, RN ?Phone Number: ?05/22/2021, 2:06 PM ? ?Clinical Narrative:                 ?Patient admitted to the hospital with severe sepsis.  Patient is currently in the ICU on levophed to maintain blood pressure.  Patient has a small bowel obstruction, NG tube to suction. ? ?TOC will follow patient progress.  ? ?Expected Discharge Plan:  (TBD) ?  ? ? ?Patient Goals and CMS Choice ?  ?  ?  ? ?Expected Discharge Plan and Services ?Expected Discharge Plan:  (TBD) ?  ?Discharge Planning Services: CM Consult ?  ?Living arrangements for the past 2 months: [place] ?                ?DME Arranged: N/A ?DME Agency: NA ?  ?  ?  ?  ?  ?  ?  ?  ? ?Prior Living Arrangements/Services ?Living arrangements for the past 2 months: [place] ?  ?Patient language and need for interpreter reviewed:: Yes ?Do you feel safe going back to the place where you live?: Yes      ?Need for Family Participation in Patient Care: Yes (Comment) ?Care giver support system in place?: Yes (comment) (daughter) ?  ?Criminal Activity/Legal Involvement Pertinent to Current Situation/Hospitalization: No - Comment as needed ? ?Activities of Daily Living ?Home Assistive Devices/Equipment: None ?ADL Screening (condition at time of admission) ?Patient's cognitive ability adequate to safely complete daily activities?: Yes ?Is the patient deaf or have difficulty hearing?: No ?Does the patient have difficulty seeing, even when wearing glasses/contacts?: No ?Does the patient have difficulty concentrating, remembering, or making decisions?: No ?Patient able to express need for assistance with ADLs?: Yes ?Does the patient have difficulty dressing or bathing?: No ?Independently performs ADLs?: Yes (appropriate for developmental age) ?Does the patient have  difficulty walking or climbing stairs?: No ?Weakness of Legs: Both ?Weakness of Arms/Hands: Both ? ?Permission Sought/Granted ?Permission sought to share information with : Case Manager, Family Supports ?Permission granted to share information with : Yes, Verbal Permission Granted ? Share Information with NAME: [name] ?   ? Permission granted to share info w Relationship: daughter ? Permission granted to share info w Contact Information: [phone] ? ?Emotional Assessment ?Appearance:: Appears stated age ?  ?  ?  ?Alcohol / Substance Use: Not Applicable ?Psych Involvement: No (comment) ? ?Admission diagnosis:  Severe sepsis with lactic acidosis (HCC) [A41.9, R65.20, E87.20] ?Patient Active Problem List  ? Diagnosis Date Noted  ? Stage 3b chronic kidney disease ([place]) 05/21/2021  ? Colonic obstruction ([place]) 05/21/2021  ? Large hiatal hernia 05/21/2021  ? Severe sepsis with lactic acidosis ([place]) 05/21/2021  ? Lumbar compression fracture ([place]) 03/24/2020  ? Fall 03/23/2020  ? L4 vertebral fracture ([place]) 03/23/2020  ? Chronic kidney disease 03/04/2015  ? Adaptive colitis 03/04/2015  ? BP (high blood pressure) 05/18/2013  ? Adult hypothyroidism 05/18/2013  ? HLD (hyperlipidemia) 05/18/2013  ? ?PCP:  [facility] [place] ?Pharmacy:   ?[id number]?Phone: [phone]: [phone]? ? ? ? ?Social Determinants of Health (SDOH) Interventions ?  ? ?Readmission Risk Interventions ?   ? View : No data to display.  ?  ?  ?  ? ? ? ?

## 2021-05-22 NOTE — Consult Note (Addendum)
Pharmacy Antibiotic Note ? ?Theresa Snyder is a 88 y.o. female admitted on 05/21/2021 with abdominal pain   Pharmacy has been consulted for Zosyn dosing. ? ?Renal function improved significantly. CrCl now > 10 ? ?Plan: ?Continue piperacillin/tazobactam 2.25 gram IV Q8H based on current renal function ? ?Height: '5\' 5"'$  (165.1 cm) ?Weight: 56.5 kg (124 lb 9 oz) ?IBW/kg (Calculated) : 57 ? ?Temp (24hrs), Avg:97.8 ?F (36.6 ?C), Min:97.5 ?F (36.4 ?C), Max:98.5 ?F (36.9 ?C) ? ?Recent Labs  ?Lab 05/21/21 ?1835 05/21/21 ?2054 05/22/21 ?3143  ?WBC 12.9*  --  19.0*  ?CREATININE 4.36*  --  2.96*  ?LATICACIDVEN 3.9* 2.8*  --   ? ?  ?Estimated Creatinine Clearance: 11.5 mL/min (A) (by C-G formula based on SCr of 2.96 mg/dL (H)).   ? ?Allergies  ?Allergen Reactions  ? Iodinated Contrast Media Other (See Comments)  ?  Blue, Green and Red dyes   ? Lorazepam   ?  Altered mental state  ? Risperidone Other (See Comments)  ?  Altered mental state   ? Sulfamethoxazole-Trimethoprim   ?  Hyper  ? ? ?Antimicrobials this admission: ?4/12 Zosyn >>  ? ?Dose adjustments this admission: ? ? ?Microbiology results: ?4/12 BCx: pending ?4/12 UCx: pending  ? ?Thank you for allowing pharmacy to be a part of this patient?s care. ? ?Wynelle Cleveland, PharmD, BCPS ?Clinical Pharmacist   ?05/22/2021 9:17 AM ? ?

## 2021-05-22 NOTE — Procedures (Signed)
Arterial Catheter Insertion Procedure Note ? ?Theresa Snyder  ?[id number]  ?[birth year] ? ?Date:05/22/21  ?Time:7:28 AM  ? ? ?Provider Performing: Karen Kays  ? ? ?Procedure: Insertion of Arterial Line [phone]) with US guidance ([id number])  ? ?Indication(s) ?Blood pressure monitoring and/or need for frequent ABGs ? ?Consent ?Risks of the procedure as well as the alternatives and risks of each were explained to the patient and/or caregiver.  Consent for the procedure was obtained and is signed in the bedside chart ? ?Anesthesia ?None ? ?Time Out ?Verified patient identification, verified procedure, site/side was marked, verified correct patient position, special equipment/implants available, medications/allergies/relevant history reviewed, required imaging and test results available. ? ?Sterile Technique ?Maximal sterile technique including full sterile barrier drape, hand hygiene, sterile gown, sterile gloves, mask, hair covering, sterile ultrasound probe cover (if used). ? ?Procedure Description ?Area of catheter insertion was cleaned with chlorhexidine and draped in sterile fashion. With real-time ultrasound guidance an arterial catheter was placed into the right radial artery.  Appropriate arterial tracings confirmed on monitor.   ? ?Complications/Tolerance ?None; patient tolerated the procedure well. ? ?EBL ?Minimal ? ?Specimen(s) ?None ? ? ?Rufina Falco, DNP, CCRN, FNP-C, AGACNP-BC ?Acute Care & Family Nurse Practitioner  ?[facility]  ?See Amion for personal pager ?PCCM on call pager [phone] until 7 am ? ? ? ?

## 2021-05-22 NOTE — Consult Note (Signed)
[facility] ?SURGICAL CONSULTATION NOTE (initial) - cpt: [phone] ? ? ?HISTORY OF PRESENT ILLNESS (HPI):  ?88 y.o. female presented to [facility] ED overnight secondary to abdominal pain. Reportedly, patient has been constipated for a few days now and went to have a bowel movement last night which resulted in the acute onset of severe abdominal pain. Pain was reportedly in the RLQ. Family did report that she has been having vague abdominal discomfort for the last 3 days as well with some nausea and anorexia. No other complaints. Only previous abdominal surgeries appear to appendectomy and tubal ligation. EMS was called and she was found to be hypotensive or their arrival as well as in the ED. Laboratory work up revealed a significant AKI with sCr - 4.36 (baseline appears to be 19.0), leukocytosis to 12.9K (now 19.0K) and lactic acidosis to 3.9 (now 1.4) but was otherwise reassuring. She would ultimately undergo CT Abdomen/Pelvis which was concerning for known large hiatal hernia but now containing part of the transverse colon with signs of possible partial large bowel obstruction. She was resuscitated with IVF in the ED but ultimately required vasopressor support. As such, she was admitted to the ICU. NGT was placed and output not recorded.  ? ?Surgery is consulted by emergency medicine  physician Dr. Ronny Bacon, MD in this context for evaluation and management of chronic hiatal hernia now containing large bowel with signs of large bowel obstruction. ? ?This afternoon, she continues on Levophed at 4 mcg/min at time of my evaluation. She reports she still has central abdominal discomfort but otherwise is doing fine. No other complaints this afternoon.  ? ? ?PAST MEDICAL HISTORY (PMH):  ?Past Medical History:  ?Diagnosis Date  ? Arthritis   ? Cancer [facility])   ? skin  ? Chronic kidney disease   ? history of renal insufficiency  ? History of hiatal hernia   ? Hypercholesteremia   ? Hypertension   ?  Hypothyroidism   ? Lymphedema of leg   ? Pneumonia   ? in the past  ? Seizures ([place]) 2014  ? no seizures since then  ? Spinal stenosis   ?  ? ?PAST SURGICAL HISTORY ([place]):  ?Past Surgical History:  ?Procedure Laterality Date  ? APPENDECTOMY    ? CATARACT EXTRACTION W/PHACO Left 09/25/2015  ? Procedure: CATARACT EXTRACTION PHACO AND INTRAOCULAR LENS PLACEMENT (IOC);  Surgeon: Estill Cotta, MD;  Location: [facility] ORS;  Service: Ophthalmology;  Laterality: Left;  [place] 01:23 ?AP% 24.0 ?CDE 34.98 ?Fluid pack lot # [id number]  ? CATARACT EXTRACTION W/PHACO Right 07/01/2016  ? Procedure: CATARACT EXTRACTION PHACO AND INTRAOCULAR LENS PLACEMENT (IOC);  Surgeon: Estill Cotta, MD;  Location: [facility] ORS;  Service: Ophthalmology;  Laterality: Right;  [place] 1:25.9 ?AP% 24.8 ?CDE 40.61 ?Fluid Pack Lot # [id number]  ? TUBAL LIGATION    ?  ? ?MEDICATIONS:  ?Prior to Admission medications   ?Medication Sig Start Date End Date Taking? Authorizing Provider  ?amLODipine (NORVASC) 10 MG tablet Take 10 mg by mouth daily with breakfast. for high blood pressure 01/12/15  Yes [provider]  ?benazepril (LOTENSIN) 20 MG tablet Take 20 mg by mouth daily. 01/12/15  Yes [provider]  ?gabapentin (NEURONTIN) 300 MG capsule Take 300 mg by mouth daily. 01/12/15  Yes [provider]  ?hydrochlorothiazide (HYDRODIURIL) 25 MG tablet Take 25 mg by mouth daily. 08/15/12  Yes [provider]  ?HYDROcodone-acetaminophen (NORCO) 10-325 MG tablet Take 1 tablet by mouth 2 (two) times daily as needed.  05/11/21  Yes [provider]  ?levothyroxine (SYNTHROID, LEVOTHROID) 88 MCG tablet Take 88 mcg by mouth daily before breakfast. 01/12/15  Yes [provider]  ?meclizine (ANTIVERT) 25 MG tablet Take 12.5-25 mg by mouth 3 (three) times daily as needed for dizziness or nausea. 02/14/15  Yes [provider]  ?pravastatin (PRAVACHOL) 20 MG tablet Take 20 mg by mouth at bedtime.    Yes [provider]  ?   ? ?ALLERGIES:  ?Allergies  ?Allergen Reactions  ? Iodinated Contrast Media Other (See Comments)  ?  Blue, Green and Red dyes   ? Lorazepam   ?  Altered mental state  ? Risperidone Other (See Comments)  ?  Altered mental state   ? Sulfamethoxazole-Trimethoprim   ?  Hyper  ?  ? ?SOCIAL HISTORY:  ?Social History  ? ?Socioeconomic History  ? Marital status: Divorced  ?  Spouse name: Not on file  ? Number of children: Not on file  ? Years of education: Not on file  ? Highest education level: Not on file  ?Occupational History  ? Not on file  ?Tobacco Use  ? Smoking status: Never  ? Smokeless tobacco: Never  ?Substance and Sexual Activity  ? Alcohol use: No  ?  Alcohol/week: 0.0 standard drinks  ? Drug use: No  ? Sexual activity: Not on file  ?Other Topics Concern  ? Not on file  ?Social History Narrative  ? Not on file  ? ?Social Determinants of Health  ? ?Financial Resource Strain: Not on file  ?Food Insecurity: Not on file  ?Transportation Needs: Not on file  ?Physical Activity: Not on file  ?Stress: Not on file  ?Social Connections: Not on file  ?Intimate Partner Violence: Not on file  ?  ? ?FAMILY HISTORY:  ?No family history on file.  ? ? ?REVIEW OF SYSTEMS:  ?Review of Systems  ?Constitutional:  Negative for chills and fever.  ?HENT:  Negative for congestion and sore throat.   ?Respiratory:  Negative for cough and shortness of breath.   ?Cardiovascular:  Negative for chest pain and palpitations.  ?Gastrointestinal:  Positive for abdominal pain, constipation and nausea. Negative for blood in stool, diarrhea and vomiting.  ?Genitourinary:  Negative for dysuria and urgency.  ?All other systems reviewed and are negative. ? ?VITAL SIGNS:  ?Temp:  [97.5 ?F (36.4 ?C)-98.5 ?F (36.9 ?C)] 97.5 ?F (36.4 ?C) (04/13 9030) ?Pulse Rate:  [63-95] 77 (04/13 1000) ?Resp:  [17-28] 22 (04/13 1000) ?BP: (40-217)/(20-183) 158/75 (04/13 1000) ?SpO2:  [78 %-100 %] 100 % (04/13 1000) ?Arterial Line BP: (80-150)/(40-84) 150/50 (04/13  1000) ?Weight:  [56.5 kg-63 kg] 56.5 kg (04/13 0500)     Height: '5\' 5"'$  (165.1 cm) Weight: 56.5 kg BMI (Calculated): 20.73  ? ?INTAKE/OUTPUT:  ?04/12 0701 - 04/13 0700 ?In: 2221.7 [I.V.:221.7; IV [id number]:2263] ?Out: -  ? ?PHYSICAL EXAM:  ?Physical Exam ?Vitals and nursing note reviewed.  ?Constitutional:   ?   General: She is not in acute distress. ?   Appearance: She is well-developed and normal weight. She is not ill-appearing.  ?   Comments: Patient resting in bed; NAD  ?HENT:  ?   Head: Normocephalic and atraumatic.  ?   Comments: NGT in place; no output  ?Eyes:  ?   General: No scleral icterus. ?   Extraocular Movements: Extraocular movements intact.  ?Cardiovascular:  ?   Rate and Rhythm: Normal rate and regular rhythm.  ?   Heart sounds: Normal heart sounds.  No murmur heard. ?Pulmonary:  ?   Effort: Pulmonary effort is normal. No respiratory distress.  ?   Breath sounds: Normal breath sounds.  ?Abdominal:  ?   General: Abdomen is flat. There is no distension.  ?   Palpations: Abdomen is soft.  ?   Tenderness: There is abdominal tenderness (Very Mild) in the periumbilical area. There is no guarding or rebound.  ?   Comments: Abdomen is soft, she reports soreness around the umbilicus but I am unable to illicit any discomfort with palpation, she does not appear distended, no rebound/guarding. I certainly do not feel she is peritonitic nor toxic at this time.   ?Genitourinary: ?   Comments: Deferred ?Skin: ?   General: Skin is warm and dry.  ?   Coloration: Skin is not jaundiced.  ?   Findings: No erythema.  ?Neurological:  ?   General: No focal deficit present.  ?   Mental Status: She is alert and oriented to person, place, and time.  ?Psychiatric:     ?   Mood and Affect: Mood normal.     ?   Behavior: Behavior normal.  ? ? ? ?Labs:  ? ?  Latest Ref Rng & Units 05/22/2021  ?  5:16 AM 05/21/2021  ?  6:35 PM 07/09/2020  ? 12:08 PM  ?CBC  ?WBC 4.0 - 10.5 K/uL 19.0   12.9   10.4    ?Hemoglobin 12.0 - 15.0 g/dL 10.8    12.4   13.0    ?Hematocrit 36.0 - 46.0 % 32.4   38.2   37.6    ?Platelets 150 - 400 K/uL 247   273   273    ? ? ?  Latest Ref Rng & Units 05/22/2021  ?  5:16 AM 05/21/2021  ?  6:35 PM 07/09/2020  ? 12:08 PM  ?CMP  ?G

## 2021-05-23 ENCOUNTER — Inpatient Hospital Stay: Payer: Medicare Other

## 2021-05-23 DIAGNOSIS — K449 Diaphragmatic hernia without obstruction or gangrene: Secondary | ICD-10-CM | POA: Diagnosis not present

## 2021-05-23 DIAGNOSIS — K56609 Unspecified intestinal obstruction, unspecified as to partial versus complete obstruction: Secondary | ICD-10-CM | POA: Diagnosis not present

## 2021-05-23 LAB — BASIC METABOLIC PANEL
Anion gap: 9 (ref 5–15)
BUN: 35 mg/dL — ABNORMAL HIGH (ref 8–23)
CO2: 21 mmol/L — ABNORMAL LOW (ref 22–32)
Calcium: 8.3 mg/dL — ABNORMAL LOW (ref 8.9–10.3)
Chloride: 106 mmol/L (ref 98–111)
Creatinine, Ser: 1.86 mg/dL — ABNORMAL HIGH (ref 0.44–1.00)
GFR, Estimated: 26 mL/min — ABNORMAL LOW (ref >60)
Glucose, Bld: 117 mg/dL — ABNORMAL HIGH (ref 70–99)
Potassium: 4.1 mmol/L (ref 3.5–5.1)
Sodium: 136 mmol/L (ref 135–145)

## 2021-05-23 LAB — CBC
HCT: 36 % (ref 36.0–46.0)
Hemoglobin: 12 g/dL (ref 12.0–15.0)
MCH: 33.4 pg (ref 26.0–34.0)
MCHC: 33.3 g/dL (ref 30.0–36.0)
MCV: 100.3 fL — ABNORMAL HIGH (ref 80.0–100.0)
Platelets: 210 10*3/uL (ref 150–400)
RBC: 3.59 MIL/uL — ABNORMAL LOW (ref 3.87–5.11)
RDW: 12.4 % (ref 11.5–15.5)
WBC: 22.8 10*3/uL — ABNORMAL HIGH (ref 4.0–10.5)
nRBC: 0 % (ref 0.0–0.2)

## 2021-05-23 LAB — URINE CULTURE: Culture: NO GROWTH

## 2021-05-23 LAB — PROCALCITONIN: Procalcitonin: 0.69 ng/mL

## 2021-05-23 LAB — PHOSPHORUS: Phosphorus: 3.1 mg/dL (ref 2.5–4.6)

## 2021-05-23 LAB — MAGNESIUM: Magnesium: 1.8 mg/dL (ref 1.7–2.4)

## 2021-05-23 MED ORDER — POLYETHYLENE GLYCOL 3350 17 G PO PACK
17.0000 g | PACK | Freq: Two times a day (BID) | ORAL | Status: DC
  Administered 2021-05-23: 17 g via ORAL
  Filled 2021-05-23: qty 1

## 2021-05-23 MED ORDER — MAGNESIUM SULFATE 2 GM/50ML IV SOLN
2.0000 g | Freq: Once | INTRAVENOUS | Status: AC
  Administered 2021-05-23: 2 g via INTRAVENOUS
  Filled 2021-05-23: qty 50

## 2021-05-23 MED ORDER — ENOXAPARIN SODIUM 30 MG/0.3ML IJ SOSY
30.0000 mg | PREFILLED_SYRINGE | INTRAMUSCULAR | Status: DC
  Administered 2021-05-23: 30 mg via SUBCUTANEOUS
  Filled 2021-05-23: qty 0.3

## 2021-05-23 MED ORDER — LEVOTHYROXINE SODIUM 88 MCG PO TABS
88.0000 ug | ORAL_TABLET | Freq: Every day | ORAL | Status: DC
  Administered 2021-05-24: 88 ug via ORAL
  Filled 2021-05-23: qty 1

## 2021-05-23 NOTE — Progress Notes (Signed)
? ?CRITICAL CARE PROGRESS NOTE ? ? ? ?Name: Theresa Snyder ?MRN: [id number] ?DOB: 1929/02/12 ? ?   ?LOS: 2 ? ? ?SUBJECTIVE FINDINGS & SIGNIFICANT EVENTS  ? ? ?Patient description:  ?presented to the ED with chief complaints of abdominal pain/cramping and near syncopal episode. ?  ?Patients daughter who is currently at the bedside report that patient has been complaining of vague, crampy, lower abdominal pain that had been present for approximately 3 days. She had mild nausea and anorexia, but she denied any emesis. The patient also reported she had not had a bowel movement in 3 days and did not recall passing flatus in the past 24 hours. Pain worsened today  with near syncopal episode prompting her to call EMS. On EMS arrival, patient was found to be hypotensive with BP of 60/30. ?  ?05/22/21- patient is on 1mg levophed, reports throat dryness and horseness, mentating well, NGT suction aspiration active. Hypoactive Bsx4 ?05/23/21- Patient is improved , optimizing for TRH transfer.  ? ?Lines/tubes ?: ?Arterial Line 05/22/21 Right Radial (Active)  ?Site Assessment Clean, Dry, Intact 05/22/21 0814  ?Line Status Pulsatile blood flow 05/22/21 0814  ?Art Line Waveform Appropriate 05/22/21 [phone] ?Art Line Interventions Zeroed and calibrated;Connections checked and tightened;Flushed per protocol 05/22/21 0814  ?Color/Movement/Sensation Capillary refill less than 3 sec 05/22/21 0814  ?Dressing Type Transparent 05/22/21 0814  ?Dressing Status Clean, Dry, Intact 05/22/21 0814  ?Dressing Change Due 05/29/21 05/22/21 0814  ?   ?NG/OG Vented/Dual Lumen 16 Fr. Right nare 50 cm (Active)  ?Tube Position (Required) Marking at nare/corner of mouth 05/22/21 0814  ?Measurement (cm) (Required) 50 cm 05/22/21 0814  ?Ongoing Placement Verification (Required) (See row  information) Yes 05/22/21 0814  ?Site Assessment Clean, Dry, Intact 05/22/21 0814  ?Interventions LEugenio Hoesvalve changed 05/21/21 2300  ?Status Low intermittent suction 05/22/21 0814  ?Amount of suction 80 mmHg 05/22/21 0000  ? ? ?Microbiology/Sepsis markers: ?Results for orders placed or performed during the hospital encounter of 05/21/21  ?Culture, blood (Routine X 2) w Reflex to ID Panel     Status: None (Preliminary result)  ? Collection Time: 05/21/21  6:33 PM  ? Specimen: BLOOD  ?Result Value Ref Range Status  ? Specimen Description BLOOD LEFT ANTECUBITAL  Final  ? Special Requests   Final  ?  BOTTLES DRAWN AEROBIC AND ANAEROBIC Blood Culture results may not be optimal due to an excessive volume of blood received in culture bottles  ? Culture   Final  ?  NO GROWTH 2 DAYS ?[facility] [address], [place] [address]?  ? Report Status PENDING  Incomplete  ?Urine Culture     Status: None  ? Collection Time: 05/21/21  8:15 PM  ? Specimen: Urine, Random  ?Result Value Ref Range Status  ? Specimen Description   Final  ?  URINE, RANDOM ?[facility]formed [facility] [address], [place] [address]?  ? Special Requests   Final  ?  NONE ?[facility]formed at [facility] [address] [place] [address]?  ? Culture   Final  ?  NO GROWTH ?Performed at [facility] [place] [place] [address]?  ? Report Status 05/23/2021 FINAL  Final  ?Culture, blood (Routine X 2) w Reflex to ID Panel     Status: None (Preliminary result)  ? Collection Time: 05/21/21  9:44 PM  ? Specimen: BLOOD  ?Result Value Ref Range Status  ?  Specimen Description BLOOD LEFT FOREARM  Final  ? Special Requests   Final  ?  BOTTLES DRAWN AEROBIC AND ANAEROBIC Blood Culture results may not be optimal due to an inadequate volume of blood received in culture bottles  ? Culture   Final  ?  NO GROWTH 2 DAYS ?Performed at [facility], [address],  [place], [place] [address] ?  ? Report Status PENDING  Incomplete  ?MRSA Next Gen by PCR, Nasal     Status: None  ? Collection Time: 05/21/21 10:39 PM  ? Specimen: Nasal Mucosa; Nasal Swab  ?Result Value Ref Range Status  ? MRSA by PCR Next Gen NOT DETECTED NOT DETECTED Final  ?  Comment: (NOTE) ?The GeneXpert MRSA Assay (FDA approved for NASAL specimens only), ?is one component of a comprehensive MRSA colonization surveillance ?program. It is not intended to diagnose MRSA infection nor to guide ?or monitor treatment for MRSA infections. ?Test performance is not FDA approved in patients less than 2 years ?old. ?Performed at [facility], [place], ?[place] [address] ?  ? ? ?Anti-infectives:  ?Anti-infectives (From admission, onward)  ? ? Start     Dose/Rate Route Frequency Ordered Stop  ? 05/22/21 2200  piperacillin-tazobactam (ZOSYN) IVPB 2.25 g  Status:  Discontinued       ? 2.25 g ?100 mL/hr over 30 Minutes Intravenous Every 8 hours 05/22/21 1414 05/22/21 1433  ? 05/22/21 1430  piperacillin-tazobactam (ZOSYN) IVPB 2.25 g       ? 2.25 g ?100 mL/hr over 30 Minutes Intravenous Every 8 hours 05/22/21 1433    ? 05/22/21 1400  piperacillin-tazobactam (ZOSYN) IVPB 3.375 g  Status:  Discontinued       ? 3.375 g ?12.5 mL/hr over 240 Minutes Intravenous Every 8 hours 05/22/21 0918 05/22/21 1414  ? 05/21/21 2200  piperacillin-tazobactam (ZOSYN) IVPB 2.25 g  Status:  Discontinued       ? 2.25 g ?100 mL/hr over 30 Minutes Intravenous Every 8 hours 05/21/21 2051 05/22/21 0918  ? ?  ? ? ? ?Consults: ?Treatment Team:  ?Ronny Bacon, MD ?Anthonette Legato, MD ?Jules Husbands, MD  ? ? ? ?PAST MEDICAL HISTORY  ? ?Past Medical History:  ?Diagnosis Date  ? Arthritis   ? Cancer [facility])   ? skin  ? Chronic kidney disease   ? history of renal insufficiency  ? History of hiatal hernia   ? Hypercholesteremia   ? Hypertension   ? Hypothyroidism   ? Lymphedema of leg   ? Pneumonia   ? in the past  ? Seizures ([place]) 2014  ?  no seizures since then  ? Spinal stenosis   ? ? ? ?SURGICAL HISTORY  ? ?Past Surgical History:  ?Procedure Laterality Date  ? APPENDECTOMY    ? CATARACT EXTRACTION W/PHACO Left 09/25/2015  ? Procedure: CATARACT EXTRACTION PHACO AND INTRAOCULAR LENS PLACEMENT (IOC);  Surgeon: Estill Cotta, MD;  Location: [facility] ORS;  Service: Ophthalmology;  Laterality: Left;  [place] 01:23 ?AP% 24.0 ?CDE 34.98 ?Fluid pack lot # [id number]  ? CATARACT EXTRACTION W/PHACO Right 07/01/2016  ? Procedure: CATARACT EXTRACTION PHACO AND INTRAOCULAR LENS PLACEMENT (IOC);  Surgeon: Estill Cotta, MD;  Location: [facility] ORS;  Service: Ophthalmology;  Laterality: Right;  [place] 1:25.9 ?AP% 24.8 ?CDE 40.61 ?Fluid Pack Lot # [id number]  ? TUBAL LIGATION    ? ? ? ?FAMILY HISTORY  ? ?No family history on file. ? ? ?SOCIAL HISTORY  ? ?Social History  ? ?Tobacco  Use  ? Smoking status: Never  ? Smokeless tobacco: Never  ?Substance Use Topics  ? Alcohol use: No  ?  Alcohol/week: 0.0 standard drinks  ? Drug use: No  ? ? ? ?MEDICATIONS  ? ?Current Medication: ? ?Current Facility-Administered Medications:  ?  Chlorhexidine Gluconate Cloth 2 % PADS 6 each, 6 each, Topical, Q0600, Lang Snow, NP, 6 each at 05/23/21 0602 ?  docusate sodium (COLACE) capsule 100 mg, 100 mg, Oral, BID PRN, Lang Snow, NP ?  levothyroxine (SYNTHROID) tablet 88 mcg, 88 mcg, Per Tube, Q0600, Bradly Bienenstock, NP, 88 mcg at 05/23/21 0601 ?  magnesium sulfate IVPB 2 g 50 mL, 2 g, Intravenous, Once, Lockie Mola B, RPH ?  norepinephrine (LEVOPHED) '4mg'$  in 227m (0.016 mg/mL) premix infusion, 0-40 mcg/min, Intravenous, Continuous, MNena Polio MD, Last Rate: 7.5 mL/hr at 05/22/21 0827, 2 mcg/min at 05/22/21 0827 ?  piperacillin-tazobactam (ZOSYN) IVPB 2.25 g, 2.25 g, Intravenous, Q8H, CWynelle Cleveland RPH, Stopped at 05/23/21 09735?  polyethylene glycol (MIRALAX / GLYCOLAX) packet 17 g, 17 g, Per NG tube, BID, RRonny Bacon MD, 17 g at 05/22/21  2122 ? ? ? ?ALLERGIES  ? ?Iodinated contrast media, Lorazepam, Risperidone, and Sulfamethoxazole-trimethoprim ? ? ? ?REVIEW OF SYSTEMS  ? ? ? ?10 point ROS negative except as per HPI ? ?PHYSICAL EXAMINATION  ? ?Vital Si

## 2021-05-23 NOTE — Consult Note (Signed)
Pharmacy Antibiotic Note ? ?Theresa Snyder is a 88 y.o. female admitted on 05/21/2021 with abdominal pain. Pharmacy has been consulted for Zosyn dosing. ? ?Renal function improved significantly. CrCl now > 10 ? ?Plan: ?Continue piperacillin/tazobactam 2.25 gram IV Q8H based on current renal function ? ?Height: '5\' 5"'$  (165.1 cm) ?Weight: 55.5 kg (122 lb 5.7 oz) ?IBW/kg (Calculated) : 57 ? ?Temp (24hrs), Avg:98.1 ?F (36.7 ?C), Min:97.4 ?F (36.3 ?C), Max:98.8 ?F (37.1 ?C) ? ?Recent Labs  ?Lab 05/21/21 ?1835 05/21/21 ?2054 05/22/21 ?5284 05/22/21 ?1038 05/23/21 ?0443  ?WBC 12.9*  --  19.0*  --  22.8*  ?CREATININE 4.36*  --  2.96*  --  1.86*  ?LATICACIDVEN 3.9* 2.8*  --  1.4  --   ? ?  ?Estimated Creatinine Clearance: 18 mL/min (A) (by C-G formula based on SCr of 1.86 mg/dL (H)).   ? ?Allergies  ?Allergen Reactions  ? Iodinated Contrast Media Other (See Comments)  ?  Blue, Green and Red dyes   ? Lorazepam   ?  Altered mental state  ? Risperidone Other (See Comments)  ?  Altered mental state   ? Sulfamethoxazole-Trimethoprim   ?  Hyper  ? ? ?Antimicrobials this admission: ?4/12 Zosyn >>  ? ?Dose adjustments this admission: ? ? ?Microbiology results: ?4/12 BCx: pending ?4/12 UCx: pending  ? ?Thank you for allowing pharmacy to be a part of this patient?s care. ? ?Wynelle Cleveland, PharmD, BCPS ?Clinical Pharmacist   ?05/23/2021 9:07 AM ? ?

## 2021-05-23 NOTE — Progress Notes (Signed)
Report given to receiving nurse for room 129 ?

## 2021-05-23 NOTE — Final Progress Note (Signed)
Accepted transfer from [facility] team. [facility] will assume care on 4/15 @ 7 am. ?

## 2021-05-23 NOTE — Plan of Care (Signed)
Continuing with plan of care. 

## 2021-05-23 NOTE — TOC Progression Note (Signed)
Transition of Care (TOC) - Progression Note  ? ? ?Patient Details  ?Name: Theresa Snyder ?MRN: [id number] ?Date of Birth: 07/02/1929 ? ?Transition of Care (TOC) CM/SW Contact  ?Shelbie Hutching, RN ?Phone Number: ?05/23/2021, 12:54 PM ? ?Clinical Narrative:    ?Patient doing much better today able to transfer out to the floor.  RNCM met with patient at the bedside, introduced self and explained role in discharge planning.  Patient lives alone and is independent, she does not drive her daughter provides transportation.  Patient agrees to home health services and does not have a preference.   ?[facility] referral accepted by Sharmon Revere with [facility] for RN and PT.   ? ? ?Expected Discharge Plan: [place] ?Barriers to Discharge: Continued Medical Work up ? ?Expected Discharge Plan and Services ?Expected Discharge Plan: [place] ?  ?Discharge Planning Services: CM Consult ?Post Acute Care Choice: Home Health ?Living arrangements for the past 2 months: [place] ?                ?DME Arranged: N/A ?DME Agency: NA ?  ?  ?  ?HH Arranged: Therapist, sports, PT ?[place] Agency: [place] ?Date HH Agency Contacted: 05/23/21 ?Time [place]: 2820 ?Representative spoke with at [place]: Sharmon Revere ? ? ?Social Determinants of Health (SDOH) Interventions ?  ? ?Readmission Risk Interventions ?   ? View : No data to display.  ?  ?  ?  ? ? ?

## 2021-05-23 NOTE — Progress Notes (Signed)
PHARMACY CONSULT NOTE - FOLLOW UP ? ?Pharmacy Consult for Electrolyte Monitoring and Replacement  ? ?Recent Labs: ?Potassium (mmol/L)  ?Date Value  ?05/23/2021 4.1  ?11/21/2012 4.0  ? ?Magnesium (mg/dL)  ?Date Value  ?05/23/2021 1.8  ?11/05/2012 3.3 (H)  ? ?Calcium (mg/dL)  ?Date Value  ?05/23/2021 8.3 (L)  ? ?Calcium, Total (mg/dL)  ?Date Value  ?11/21/2012 9.4  ? ?Albumin (g/dL)  ?Date Value  ?05/21/2021 4.1  ?11/21/2012 3.5  ? ?Phosphorus (mg/dL)  ?Date Value  ?05/23/2021 3.1  ? ?Sodium (mmol/L)  ?Date Value  ?05/23/2021 136  ?11/21/2012 135 (L)  ?Scr 2.96 > 1.86 (BL 1.3) ? ?Assessment: ?88 year old female presenting with sepsis and intra-abdominal infection. PMH arthritis, cancer, CKD, hiatal hernia, HLD, HTN, Hypothyroid. No plans for surgical intervention, per surgical team. ? ?Renal function improving. ? ?Goal of Therapy:  ?Electrolytes within normal limits ? ?Plan:  ?Magnesium 2 grams x 1 ?Follow up AM labs ? ?Wynelle Cleveland, PharmD ?Pharmacy Resident  ?05/23/2021 ?8:48 AM ?

## 2021-05-23 NOTE — Consult Note (Signed)
?[facility] ?CONSULT NOTE  ? ? ?Date: 05/23/2021      ?      ?      ?Patient Name:  Theresa Snyder  MRN: [id number]  ?DOB: [birth year]  Age / Sex: 88 y.o., female   ?      ?PCP: [facility] [place]     ?      ?      ?Service Requesting Consult: Hospitalist     ?      ?      ?Reason for Consult: Acute kidney injury     ?      ? ?History of Present Illness: ?Patient is a 88 y.o. female with a PMHx of chronic kidney disease stage IIIb baseline EGFR 33, lumbar spinal stenosis, hypertension, hyperlipidemia, hypothyroidism, chronic constipation, hiatal hernia, who was admitted to [facility] on 05/21/2021 for evaluation of large bowel obstruction.  When she first presented CT scan of the abdomen pelvis was obtained and showed a large hiatal hernia with almost the entire stomach within the hernia causing partial colonic obstruction with dilatation and diffuse fluid within the more proximal colon.  She was noted to be quite hypotensive upon initial evaluation.  General surgery was consulted and they recommended NG tube suctioning for decompression.  We are asked to see the patient for evaluation management of acute kidney injury.  Her initial creatinine was 4.36.  This did come down to 2.96 yesterday and today renal function further improved with creatinine down to 1.8.  As above she has known chronic kidney disease with baseline creatinine of 1.5 and EGFR 33.  She denies any NSAID ingestion.  General surgery has also been following the patient. ? ? ?Medications: ?Outpatient medications: ?Medications Prior to Admission  ?Medication Sig Dispense Refill Last Dose  ? amLODipine (NORVASC) 10 MG tablet Take 10 mg by mouth daily with breakfast. for high blood pressure  1 05/21/2021  ? benazepril (LOTENSIN) 20 MG tablet Take 20 mg by mouth daily.  1 05/21/2021  ? gabapentin (NEURONTIN) 300 MG capsule Take 300 mg by mouth daily.  5 05/21/2021  ? hydrochlorothiazide (HYDRODIURIL) 25 MG tablet Take 25 mg  by mouth daily.   05/21/2021  ? HYDROcodone-acetaminophen (NORCO) 10-325 MG tablet Take 1 tablet by mouth 2 (two) times daily as needed.   prn at prn  ? levothyroxine (SYNTHROID, LEVOTHROID) 88 MCG tablet Take 88 mcg by mouth daily before breakfast.  3 05/21/2021  ? meclizine (ANTIVERT) 25 MG tablet Take 12.5-25 mg by mouth 3 (three) times daily as needed for dizziness or nausea.  0 prn at prn  ? pravastatin (PRAVACHOL) 20 MG tablet Take 20 mg by mouth at bedtime.    05/20/2021  ? ? ?Current medications: ?Current Facility-Administered Medications  ?Medication Dose Route Frequency Provider Last Rate Last Admin  ? Chlorhexidine Gluconate Cloth 2 % PADS 6 each  6 each Topical Q0600 Lang Snow, NP   6 each at 05/23/21 0602  ? docusate sodium (COLACE) capsule 100 mg  100 mg Oral BID PRN Lang Snow, NP      ? enoxaparin (LOVENOX) injection 30 mg  30 mg Subcutaneous Q24H Wynelle Cleveland, RPH      ? [START ON 05/24/2021] levothyroxine (SYNTHROID) tablet 88 mcg  88 mcg Oral Q0600 Benita Gutter, RPH      ? piperacillin-tazobactam (ZOSYN) IVPB 2.25 g  2.25 g Intravenous Q8H Mickeal Skinner A, RPH 100 mL/hr at 05/23/21 1338  2.25 g at 05/23/21 1338  ? polyethylene glycol (MIRALAX / GLYCOLAX) packet 17 g  17 g Oral BID Benita Gutter, RPH      ?  ? ? ?Allergies: ?Allergies  ?Allergen Reactions  ? Iodinated Contrast Media Other (See Comments)  ?  Blue, Green and Red dyes   ? Lorazepam   ?  Altered mental state  ? Risperidone Other (See Comments)  ?  Altered mental state   ? Sulfamethoxazole-Trimethoprim   ?  Hyper  ?  ? ? ?Past Medical History: ?Past Medical History:  ?Diagnosis Date  ? Arthritis   ? Cancer [facility])   ? skin  ? Chronic kidney disease   ? history of renal insufficiency  ? History of hiatal hernia   ? Hypercholesteremia   ? Hypertension   ? Hypothyroidism   ? Lymphedema of leg   ? Pneumonia   ? in the past  ? Seizures ([place]) 2014  ? no seizures since then  ? Spinal stenosis   ? ? ? ?Past  Surgical History: ?Past Surgical History:  ?Procedure Laterality Date  ? APPENDECTOMY    ? CATARACT EXTRACTION W/PHACO Left 09/25/2015  ? Procedure: CATARACT EXTRACTION PHACO AND INTRAOCULAR LENS PLACEMENT (IOC);  Surgeon: Estill Cotta, MD;  Location: [facility] ORS;  Service: Ophthalmology;  Laterality: Left;  [place] 01:23 ?AP% 24.0 ?CDE 34.98 ?Fluid pack lot # [id number]  ? CATARACT EXTRACTION W/PHACO Right 07/01/2016  ? Procedure: CATARACT EXTRACTION PHACO AND INTRAOCULAR LENS PLACEMENT (IOC);  Surgeon: Estill Cotta, MD;  Location: [facility] ORS;  Service: Ophthalmology;  Laterality: Right;  [place] 1:25.9 ?AP% 24.8 ?CDE 40.61 ?Fluid Pack Lot # [id number]  ? TUBAL LIGATION    ? ? ? ?Family History: ?No family history on file. ? ? ?Social History: ?Social History  ? ?Socioeconomic History  ? Marital status: Divorced  ?  Spouse name: Not on file  ? Number of children: Not on file  ? Years of education: Not on file  ? Highest education level: Not on file  ?Occupational History  ? Not on file  ?Tobacco Use  ? Smoking status: Never  ? Smokeless tobacco: Never  ?Substance and Sexual Activity  ? Alcohol use: No  ?  Alcohol/week: 0.0 standard drinks  ? Drug use: No  ? Sexual activity: Not on file  ?Other Topics Concern  ? Not on file  ?Social History Narrative  ? Not on file  ? ?Social Determinants of Health  ? ?Financial Resource Strain: Not on file  ?Food Insecurity: Not on file  ?Transportation Needs: Not on file  ?Physical Activity: Not on file  ?Stress: Not on file  ?Social Connections: Not on file  ?Intimate Partner Violence: Not on file  ? ? ? ?Review of Systems: ?Review of Systems  ?Constitutional:  Negative for chills, fever and malaise/fatigue.  ?HENT:  Negative for congestion, hearing loss and tinnitus.   ?Eyes:  Negative for blurred vision and double vision.  ?Respiratory:  Negative for cough, sputum production and shortness of breath.   ?Cardiovascular:  Negative for chest pain, palpitations and orthopnea.   ?Gastrointestinal:  Positive for abdominal pain, nausea and vomiting. Negative for diarrhea.  ?Genitourinary:  Negative for dysuria, frequency and urgency.  ?Musculoskeletal:  Negative for myalgias.  ?Skin:  Negative for itching and rash.  ?Neurological:  Negative for dizziness and focal weakness.  ?Endo/Heme/Allergies:  Negative for polydipsia. Does not bruise/bleed easily.  ?Psychiatric/Behavioral:  The patient is not nervous/anxious.   ? ? ?Vital Signs: ?Blood  pressure 137/69, pulse 79, temperature 98.1 ?F (36.7 ?C), resp. rate 16, height _0  (1.651 m), weight 55.5 kg, SpO2 98 %. ? ?Weight trends: ?Filed Weights  ? 05/21/21 2240 05/22/21 0500 05/23/21 0426  ?Weight: 56.5 kg 56.5 kg 55.5 kg  ? ? ? ?Physical Exam: ?General: No acute distress  ?Head: Normocephalic, atraumatic. Moist oral mucosal membranes  ?Eyes: Anicteric  ?Neck: Supple  ?Lungs:  Clear to auscultation, normal effort  ?Heart: S1S2 no rubs  ?Abdomen:  Soft, nontender, bowel sounds present currently  ?Extremities: No peripheral edema.  ?Neurologic: Awake, alert, following commands  ?Skin: No acute rash  ?Access: No hemodialysis access  ? ? ?Lab results: ?Basic Metabolic Panel: ?Recent Labs  ?Lab 05/21/21 ?1835 05/22/21 ?3276 05/23/21 ?0443  ?NA 136 135 136  ?K 3.9 4.2 4.1  ?CL 101 108 106  ?CO2 22 20* 21*  ?GLUCOSE 125* 141* 117*  ?BUN 56* 48* 35*  ?CREATININE 4.36* 2.96* 1.86*  ?CALCIUM 8.3* 7.4* 8.3*  ?MG  --  1.3* 1.8  ?PHOS  --  3.9 3.1  ? ? ?Liver Function Tests: ?Recent Labs  ?Lab 05/21/21 ?1835  ?AST 24  ?ALT 11  ?ALKPHOS 67  ?BILITOT 0.6  ?PROT 7.4  ?ALBUMIN 4.1  ? ?Recent Labs  ?Lab 05/21/21 ?1835 05/21/21 ?2054  ?LIPASE 89* 82*  ?AMYLASE  --  466*  ? ?No results for input(s): AMMONIA in the last 168 hours. ? ?CBC: ?Recent Labs  ?Lab 05/21/21 ?1835 05/22/21 ?1470 05/23/21 ?0443  ?WBC 12.9* 19.0* 22.8*  ?NEUTROABS 5.4  --   --   ?HGB 12.4 10.8* 12.0  ?HCT 38.2 32.4* 36.0  ?MCV 102.1* 98.8 100.3*  ?PLT 273 247 210  ? ? ?Cardiac Enzymes: ?No  results for input(s): CKTOTAL, CKMB, CKMBINDEX, TROPONINI in the last 168 hours. ? ?BNP: ?Invalid input(s): POCBNP ? ?CBG: ?Recent Labs  ?Lab 05/21/21 ?2238  ?GLUCAP 211*  ? ? ?Microbiology: ?Results for orders place

## 2021-05-23 NOTE — Progress Notes (Signed)
[facility] ?SURGICAL PROGRESS NOTE (cpt [phone]) ? ?Hospital Day(s): 2.  ? ?Interval History: Patient seen and examined, no acute events or new complaints overnight. Patient reports "she is just learning about what happened and does not remember yesterday." She reports very mild left sided abdominal discomfort. No fever, chills, nausea, emesis. Her leukocytosis is slightly worse this morning; up to 22.0K. Renal function improving; sCr - 1.89; UO - 550 ccs + unmeasured. No electrolyte derangements. Seems NGT was removed yesterday. She did have 3 bowel movements recorded. She continues on Zosyn. She is NPO this morning.  ? ?Review of Systems:  ?Constitutional: denies fever, chills  ?HEENT: denies cough or congestion  ?Respiratory: denies any shortness of breath  ?Cardiovascular: denies chest pain or palpitations  ?Gastrointestinal: + abdominal pain (improved), denied N/V/D ?Genitourinary: denies burning with urination or urinary frequency ? ?Vital signs in last 24 hours: [min-max] current  ?Temp:  [97.4 ?F (36.3 ?C)-98.8 ?F (37.1 ?C)] 98.8 ?F (37.1 ?C) (04/14 0200) ?Pulse Rate:  [63-118] 99 (04/14 0600) ?Resp:  [18-29] 23 (04/14 0600) ?BP: (104-176)/(60-139) 136/60 (04/14 0600) ?SpO2:  [95 %-100 %] 97 % (04/14 0600) ?Arterial Line BP: (89-150)/(45-108) 102/98 (04/14 0400) ?Weight:  [55.5 kg] 55.5 kg (04/14 0426)     Height: '5\' 5"'$  (165.1 cm) Weight: 55.5 kg BMI (Calculated): 20.36  ? ?Intake/Output last 2 shifts:  ?04/13 0701 - 04/14 0700 ?In: 280.3 [I.V.:38.1; IV Piggyback:242.1] ?Out: 550 [Urine:550]  ? ?Physical Exam:  ?Constitutional: alert, cooperative and no distress  ?HENT: normocephalic without obvious abnormality  ?Eyes: PERRL, EOM's grossly intact and symmetric  ?Respiratory: breathing non-labored at rest  ?Cardiovascular: regular rate and sinus rhythm  ?Gastrointestinal: abdomen is soft, I am unable to illicit any tenderness on examination, she is non-distended, no rebound/guarding. She is  certainly without peritonitis ?Musculoskeletal: no edema or wounds, motor and sensation grossly intact, NT  ? ? ?Labs:  ? ?  Latest Ref Rng & Units 05/23/2021  ?  4:43 AM 05/22/2021  ?  5:16 AM 05/21/2021  ?  6:35 PM  ?CBC  ?WBC 4.0 - 10.5 K/uL 22.8   19.0   12.9    ?Hemoglobin 12.0 - 15.0 g/dL 12.0   10.8   12.4    ?Hematocrit 36.0 - 46.0 % 36.0   32.4   38.2    ?Platelets 150 - 400 K/uL 210   247   273    ? ? ?  Latest Ref Rng & Units 05/23/2021  ?  4:43 AM 05/22/2021  ?  5:16 AM 05/21/2021  ?  6:35 PM  ?CMP  ?Glucose 70 - 99 mg/dL 117   141   125    ?BUN 8 - 23 mg/dL 35   48   56    ?Creatinine 0.44 - 1.00 mg/dL 1.86   2.96   4.36    ?Sodium 135 - 145 mmol/L 136   135   136    ?Potassium 3.5 - 5.1 mmol/L 4.1   4.2   3.9    ?Chloride 98 - 111 mmol/L 106   108   101    ?CO2 22 - 32 mmol/L '21   20   22    '$ ?Calcium 8.9 - 10.3 mg/dL 8.3   7.4   8.3    ?Total Protein 6.5 - 8.1 g/dL   7.4    ?Total Bilirubin 0.3 - 1.2 mg/dL   0.6    ?Alkaline Phos 38 - 126 U/L   67    ?  AST 15 - 41 U/L   24    ?ALT 0 - 44 U/L   11    ? ? ? ?Imaging studies:  ? ?KUB (05/23/2021) personally reviewed with non-specific bowel gas pattern,, and radiologist report reviewed below:  ?IMPRESSION: ?Indeterminate bowel-gas pattern not significantly changed since the ?CT on 05/21/2021. ?  ? ?Assessment/Plan: (ICD-10's: K44.9, K56.609) ?88 y.o. female with known large hiatal hernia now containing segment of transverse colon with proximal large bowel dilation concerning for potential obstruction, complicated by numerous comorbid conditions, advanced age, and fragility.  ? ? - She appears much better this morning clinically. She has had NGT removed and had numerous bowel movements recorded. Her clinical examination is very reassuring and her workup has been as well aside from leukocytosis alone. Clinically, I do not think she has any evidence of bowel compromise in relation to the colon now involved in the hiatal hernia and with ROBF I suspect any degree of  partial obstruction is improved/resolved.  I do not think she warrants surgical intervention at this time. She vocalized again this morning that she "does not want any surgery unless deemed absolutely necessary." I think this is a more than reasonable choice given her comorbidities and overall physical status. I do not think elective repair will ever be feasible given this as well. Again, no signs/symptoms of bowel compromise at this time to warrant emergent intervention ? ? - I believe we can trial her on CLD this morning  ? - Monitor abdominal examination; on-going bowel function ?           - Can consider serial KUBs as needed ?           - Monitor leukocytosis; slightly worse  ?           - Monitor renal function; improved ?           - Further management per primary service; we will follow   ? ?All of the above findings and recommendations were discussed with the patient, and the medical team, and all of patient's questions were answered to her expressed satisfaction. ? ? ?-- ?Edison Simon, PA-C ?[facility] ?05/23/2021, 8:35 AM ?M-F: 7am - 4pm ? ?

## 2021-05-24 DIAGNOSIS — A419 Sepsis, unspecified organism: Secondary | ICD-10-CM

## 2021-05-24 DIAGNOSIS — K449 Diaphragmatic hernia without obstruction or gangrene: Secondary | ICD-10-CM | POA: Diagnosis not present

## 2021-05-24 DIAGNOSIS — N1832 Chronic kidney disease, stage 3b: Secondary | ICD-10-CM

## 2021-05-24 DIAGNOSIS — E039 Hypothyroidism, unspecified: Secondary | ICD-10-CM

## 2021-05-24 DIAGNOSIS — K56609 Unspecified intestinal obstruction, unspecified as to partial versus complete obstruction: Secondary | ICD-10-CM | POA: Diagnosis not present

## 2021-05-24 DIAGNOSIS — E872 Acidosis, unspecified: Secondary | ICD-10-CM

## 2021-05-24 DIAGNOSIS — R6520 Severe sepsis without septic shock: Secondary | ICD-10-CM

## 2021-05-24 LAB — CBC
HCT: 33 % — ABNORMAL LOW (ref 36.0–46.0)
Hemoglobin: 11.2 g/dL — ABNORMAL LOW (ref 12.0–15.0)
MCH: 33.7 pg (ref 26.0–34.0)
MCHC: 33.9 g/dL (ref 30.0–36.0)
MCV: 99.4 fL (ref 80.0–100.0)
Platelets: 185 10*3/uL (ref 150–400)
RBC: 3.32 MIL/uL — ABNORMAL LOW (ref 3.87–5.11)
RDW: 13 % (ref 11.5–15.5)
WBC: 13.9 10*3/uL — ABNORMAL HIGH (ref 4.0–10.5)
nRBC: 0 % (ref 0.0–0.2)

## 2021-05-24 LAB — MAGNESIUM: Magnesium: 2.3 mg/dL (ref 1.7–2.4)

## 2021-05-24 LAB — BASIC METABOLIC PANEL
Anion gap: 6 (ref 5–15)
BUN: 27 mg/dL — ABNORMAL HIGH (ref 8–23)
CO2: 25 mmol/L (ref 22–32)
Calcium: 8.6 mg/dL — ABNORMAL LOW (ref 8.9–10.3)
Chloride: 106 mmol/L (ref 98–111)
Creatinine, Ser: 1.46 mg/dL — ABNORMAL HIGH (ref 0.44–1.00)
GFR, Estimated: 34 mL/min — ABNORMAL LOW (ref >60)
Glucose, Bld: 98 mg/dL (ref 70–99)
Potassium: 4.4 mmol/L (ref 3.5–5.1)
Sodium: 137 mmol/L (ref 135–145)

## 2021-05-24 LAB — PHOSPHORUS: Phosphorus: 2.7 mg/dL (ref 2.5–4.6)

## 2021-05-24 MED ORDER — METRONIDAZOLE 500 MG PO TABS: 500.0000 mg | ORAL_TABLET | Freq: Two times a day (BID) | ORAL | 0 refills | Status: AC

## 2021-05-24 MED ORDER — METRONIDAZOLE 500 MG PO TABS
500.0000 mg | ORAL_TABLET | Freq: Two times a day (BID) | ORAL | Status: DC
  Administered 2021-05-24: 500 mg via ORAL
  Filled 2021-05-24: qty 1

## 2021-05-24 MED ORDER — DOCUSATE SODIUM 100 MG PO CAPS
100.0000 mg | ORAL_CAPSULE | Freq: Two times a day (BID) | ORAL | 0 refills | Status: DC | PRN
Start: 2021-05-24 — End: 2022-08-11

## 2021-05-24 NOTE — TOC Transition Note (Signed)
Transition of Care (TOC) - CM/SW Discharge Note ? ? ?Patient Details  ?Name: Theresa Snyder ?MRN: [id number] ?Date of Birth: [birth year] ? ?Transition of Care (TOC) CM/SW Contact:  ?Harriet Masson, RN ?Phone Number: [phone] ?05/24/2021, 2:46 PM ? ? ?Clinical Narrative:    ?RN alerted the accepting [facility] ([facility]) that pt would be discharged today for [facility] services. No other needs or request at this time. ? ?  ?Barriers to Discharge: Continued Medical Work up ? ? ?Patient Goals and CMS Choice ?Patient states their goals for this hospitalization and ongoing recovery are:: wants to be healthy and able to walk around good ?CMS Medicare.gov Compare Post Acute Care list provided to:: Patient ?Choice offered to / list presented to : Patient ? ?Discharge Placement ?  ?           ?  ?  ?  ?  ? ?Discharge Plan and Services ?  ?Discharge Planning Services: CM Consult ?Post Acute Care Choice: Home Health          ?DME Arranged: N/A ?DME Agency: NA ?  ?  ?  ?HH Arranged: Therapist, sports, PT ?[place] Agency: [place] ?Date HH Agency Contacted: 05/23/21 ?Time Heard: [phone] ?Representative spoke with at [place]: Sharmon Revere ? ?Social Determinants of Health (SDOH) Interventions ?  ? ? ?Readmission Risk Interventions ?   ? View : No data to display.  ?  ?  ?  ? ? ? ? ? ?

## 2021-05-24 NOTE — Progress Notes (Signed)
? ?CRITICAL CARE PROGRESS NOTE ? ? ? ?Name: Theresa Snyder ?MRN: [id number] ?DOB: Jun 03, 1929 ? ?   ?LOS: 3 ? ? ?SUBJECTIVE FINDINGS & SIGNIFICANT EVENTS  ? ? ?Patient description:  ?presented to the ED with chief complaints of abdominal pain/cramping and near syncopal episode. ?  ?Patients daughter who is currently at the bedside report that patient has been complaining of vague, crampy, lower abdominal pain that had been present for approximately 3 days. She had mild nausea and anorexia, but she denied any emesis. The patient also reported she had not had a bowel movement in 3 days and did not recall passing flatus in the past 24 hours. Pain worsened today  with near syncopal episode prompting her to call EMS. On EMS arrival, patient was found to be hypotensive with BP of 60/30. ?  ?05/22/21- patient is on 37mg levophed, reports throat dryness and horseness, mentating well, NGT suction aspiration active. Hypoactive Bsx4 ?05/23/21- Patient is improved , optimizing for TRH transfer.  ? ?Lines/tubes ?: ?Arterial Line 05/22/21 Right Radial (Active)  ?Site Assessment Clean, Dry, Intact 05/22/21 0814  ?Line Status Pulsatile blood flow 05/22/21 0814  ?Art Line Waveform Appropriate 05/22/21 [id number] ?Art Line Interventions Zeroed and calibrated;Connections checked and tightened;Flushed per protocol 05/22/21 0814  ?Color/Movement/Sensation Capillary refill less than 3 sec 05/22/21 0814  ?Dressing Type Transparent 05/22/21 0814  ?Dressing Status Clean, Dry, Intact 05/22/21 0814  ?Dressing Change Due 05/29/21 05/22/21 0814  ?   ?NG/OG Vented/Dual Lumen 16 Fr. Right nare 50 cm (Active)  ?Tube Position (Required) Marking at nare/corner of mouth 05/22/21 0814  ?Measurement (cm) (Required) 50 cm 05/22/21 0814  ?Ongoing Placement Verification (Required) (See row  information) Yes 05/22/21 0814  ?Site Assessment Clean, Dry, Intact 05/22/21 0814  ?Interventions LEugenio Hoesvalve changed 05/21/21 2300  ?Status Low intermittent suction 05/22/21 0814  ?Amount of suction 80 mmHg 05/22/21 0000  ? ? ?Microbiology/Sepsis markers: ?Results for orders placed or performed during the hospital encounter of 05/21/21  ?Culture, blood (Routine X 2) w Reflex to ID Panel     Status: None (Preliminary result)  ? Collection Time: 05/21/21  6:33 PM  ? Specimen: BLOOD  ?Result Value Ref Range Status  ? Specimen Description BLOOD LEFT ANTECUBITAL  Final  ? Special Requests   Final  ?  BOTTLES DRAWN AEROBIC AND ANAEROBIC Blood Culture results may not be optimal due to an excessive volume of blood received in culture bottles  ? Culture   Final  ?  NO GROWTH 3 DAYS ?[facility] [address], [place] [address]?  ? Report Status PENDING  Incomplete  ?Urine Culture     Status: None  ? Collection Time: 05/21/21  8:15 PM  ? Specimen: Urine, Random  ?Result Value Ref Range Status  ? Specimen Description   Final  ?  URINE, RANDOM ?[facility]formed at [facility] [address], [place] [address]?  ? Special Requests   Final  ?  NONE ?[facility]formed at [facility] [address], [place] [address]?  ? Culture   Final  ?  NO GROWTH ?[facility]formed [facility] [address], [place]  [address]?  ? Report Status 05/23/2021 FINAL  Final  ?Culture, blood (Routine X 2) w Reflex to ID Panel     Status: None (Preliminary result)  ? Collection Time: 05/21/21  9:44 PM  ? Specimen: BLOOD  ?Result Value Ref Range Status  ?  Specimen Description BLOOD LEFT FOREARM  Final  ? Special Requests   Final  ?  BOTTLES DRAWN AEROBIC AND ANAEROBIC Blood Culture results may not be optimal due to an inadequate volume of blood received in culture bottles  ? Culture   Final  ?  NO GROWTH 3 DAYS ?Performed at [facility], [address]  [place], [place] [address] ?  ? Report Status PENDING  Incomplete  ?MRSA Next Gen by PCR, Nasal     Status: None  ? Collection Time: 05/21/21 10:39 PM  ? Specimen: Nasal Mucosa; Nasal Swab  ?Result Value Ref Range Status  ? MRSA by PCR Next Gen NOT DETECTED NOT DETECTED Final  ?  Comment: (NOTE) ?The GeneXpert MRSA Assay (FDA approved for NASAL specimens only), ?is one component of a comprehensive MRSA colonization surveillance ?program. It is not intended to diagnose MRSA infection nor to guide ?or monitor treatment for MRSA infections. ?Test performance is not FDA approved in patients less than 2 years ?old. ?Performed at [facility], [place], ?[place] [address] ?  ? ? ?Anti-infectives:  ?Anti-infectives (From admission, onward)  ? ? Start     Dose/Rate Route Frequency Ordered Stop  ? 05/22/21 2200  piperacillin-tazobactam (ZOSYN) IVPB 2.25 g  Status:  Discontinued       ? 2.25 g ?100 mL/hr over 30 Minutes Intravenous Every 8 hours 05/22/21 1414 05/22/21 1433  ? 05/22/21 1430  piperacillin-tazobactam (ZOSYN) IVPB 2.25 g       ? 2.25 g ?100 mL/hr over 30 Minutes Intravenous Every 8 hours 05/22/21 1433    ? 05/22/21 1400  piperacillin-tazobactam (ZOSYN) IVPB 3.375 g  Status:  Discontinued       ? 3.375 g ?12.5 mL/hr over 240 Minutes Intravenous Every 8 hours 05/22/21 0918 05/22/21 1414  ? 05/21/21 2200  piperacillin-tazobactam (ZOSYN) IVPB 2.25 g  Status:  Discontinued       ? 2.25 g ?100 mL/hr over 30 Minutes Intravenous Every 8 hours 05/21/21 2051 05/22/21 0918  ? ?  ? ? ? ?Consults: ?Treatment Team:  ?Anthonette Legato, MD  ? ? ? ?PAST MEDICAL HISTORY  ? ?Past Medical History:  ?Diagnosis Date  ? Arthritis   ? Cancer [facility])   ? skin  ? Chronic kidney disease   ? history of renal insufficiency  ? History of hiatal hernia   ? Hypercholesteremia   ? Hypertension   ? Hypothyroidism   ? Lymphedema of leg   ? Pneumonia   ? in the past  ? Seizures ([place]) 2014  ? no seizures since then  ? Spinal stenosis    ? ? ? ?SURGICAL HISTORY  ? ?Past Surgical History:  ?Procedure Laterality Date  ? APPENDECTOMY    ? CATARACT EXTRACTION W/PHACO Left 09/25/2015  ? Procedure: CATARACT EXTRACTION PHACO AND INTRAOCULAR LENS PLACEMENT (IOC);  Surgeon: Estill Cotta, MD;  Location: [facility] ORS;  Service: Ophthalmology;  Laterality: Left;  [place] 01:23 ?AP% 24.0 ?CDE 34.98 ?Fluid pack lot # [id number]  ? CATARACT EXTRACTION W/PHACO Right 07/01/2016  ? Procedure: CATARACT EXTRACTION PHACO AND INTRAOCULAR LENS PLACEMENT (IOC);  Surgeon: Estill Cotta, MD;  Location: [facility] ORS;  Service: Ophthalmology;  Laterality: Right;  [place] 1:25.9 ?AP% 24.8 ?CDE 40.61 ?Fluid Pack Lot # [id number]  ? TUBAL LIGATION    ? ? ? ?FAMILY HISTORY  ? ?No family history on file. ? ? ?SOCIAL HISTORY  ? ?Social History  ? ?Tobacco Use  ? Smoking status: Never  ?  Smokeless tobacco: Never  ?Substance Use Topics  ? Alcohol use: No  ?  Alcohol/week: 0.0 standard drinks  ? Drug use: No  ? ? ? ?MEDICATIONS  ? ?Current Medication: ? ?Current Facility-Administered Medications:  ?  Chlorhexidine Gluconate Cloth 2 % PADS 6 each, 6 each, Topical, Q0600, Lang Snow, NP, 6 each at 05/24/21 0454 ?  docusate sodium (COLACE) capsule 100 mg, 100 mg, Oral, BID PRN, Lang Snow, NP ?  enoxaparin (LOVENOX) injection 30 mg, 30 mg, Subcutaneous, Q24H, Wynelle Cleveland, RPH, 30 mg at 05/23/21 2253 ?  levothyroxine (SYNTHROID) tablet 88 mcg, 88 mcg, Oral, Q0600, Benita Gutter, RPH, 88 mcg at 05/24/21 0454 ?  piperacillin-tazobactam (ZOSYN) IVPB 2.25 g, 2.25 g, Intravenous, Q8H, Wynelle Cleveland, RPH, Stopped at 05/24/21 7867 ?  polyethylene glycol (MIRALAX / GLYCOLAX) packet 17 g, 17 g, Oral, BID, Benita Gutter, RPH, 17 g at 05/23/21 2253 ? ? ? ?ALLERGIES  ? ?Iodinated contrast media, Lorazepam, Risperidone, and Sulfamethoxazole-trimethoprim ? ? ? ?REVIEW OF SYSTEMS  ? ? ? ?10 point ROS negative except as per HPI ? ?PHYSICAL EXAMINATION  ? ?Vital  Signs: ?Temp:  [98 ?F (36.7 ?C)-98.2 ?F (36.8 ?C)] 98.2 ?F (36.8 ?C) (04/15 0454) ?Pulse Rate:  [66-100] 66 (04/15 0454) ?Resp:  [16-31] 16 (04/15 0454) ?BP: (124-138)/(51-80) 124/64 (04/15 0454) ?SpO2:  [94 %-100 %] 99

## 2021-05-24 NOTE — Progress Notes (Signed)
[facility] ?[place], [place] ?05/24/21 ? ?Subjective:  ? ?Hospital day # 3 ? ?Patient reports feeling fine today.  She is currently on room air. ?States she was able to eat without nausea or vomiting. ? ?Renal: ?04/14 0701 - 04/15 0700 ?In: 150 [IV Piggyback:150] ?Out: -  ?Lab Results  ?Component Value Date  ? CREATININE 1.46 (H) 05/24/2021  ? CREATININE 1.86 (H) 05/23/2021  ? CREATININE 2.96 (H) 05/22/2021  ? ? ? ?Objective:  ?Vital signs in last 24 hours:  ?Temp:  [98 ?F (36.7 ?C)-98.2 ?F (36.8 ?C)] 98.2 ?F (36.8 ?C) (04/15 0454) ?Pulse Rate:  [59-100] 59 (04/15 0800) ?Resp:  [16-20] 16 (04/15 0454) ?BP: (115-138)/(51-80) 115/58 (04/15 0800) ?SpO2:  [94 %-100 %] 96 % (04/15 0800) ?Weight:  [57.1 kg] 57.1 kg (04/15 0457) ? ?Weight change: 1.6 kg ?Filed Weights  ? 05/22/21 0500 05/23/21 0426 05/24/21 0457  ?Weight: 56.5 kg 55.5 kg 57.1 kg  ? ? ?Intake/Output: ?  ? ?Intake/Output Summary (Last 24 hours) at 05/24/2021 1141 ?Last data filed at 05/24/2021 1013 ?Gross per 24 hour  ?Intake 479.11 ml  ?Output --  ?Net 479.11 ml  ? ? ? ?Physical Exam: ?General: Thin built, no acute distress, laying in the bed  ?HEENT Moist oral mucous membranes  ?Pulm/lungs Normal breathing effort on room air  ?CVS/Heart No rub  ?Abdomen:  Soft, nontender  ?Extremities: No peripheral edema  ?Neurologic: Alert, able to answer questions  ?Skin: No acute rashes  ?   ?   ? ? ?Basic Metabolic Panel: ? ?Recent Labs  ?Lab 05/21/21 ?1835 05/22/21 ?9147 05/23/21 ?0443 05/24/21 ?8295  ?NA 136 135 136 137  ?K 3.9 4.2 4.1 4.4  ?CL 101 108 106 106  ?CO2 22 20* 21* 25  ?GLUCOSE 125* 141* 117* 98  ?BUN 56* 48* 35* 27*  ?CREATININE 4.36* 2.96* 1.86* 1.46*  ?CALCIUM 8.3* 7.4* 8.3* 8.6*  ?MG  --  1.3* 1.8 2.3  ?PHOS  --  3.9 3.1 2.7  ? ? ? ?CBC: ?Recent Labs  ?Lab 05/21/21 ?1835 05/22/21 ?6213 05/23/21 ?0443 05/24/21 ?0865  ?WBC 12.9* 19.0* 22.8* 13.9*  ?NEUTROABS 5.4  --   --   --   ?HGB 12.4 10.8* 12.0 11.2*  ?HCT 38.2 32.4* 36.0 33.0*   ?MCV 102.1* 98.8 100.3* 99.4  ?PLT 273 247 210 185  ? ? ? No results found for: HEPBSAG, HEPBSAB, HEPBIGM ? ? ? ?Microbiology: ? ?Recent Results (from the past 240 hour(s))  ?Culture, blood (Routine X 2) w Reflex to ID Panel     Status: None (Preliminary result)  ? Collection Time: 05/21/21  6:33 PM  ? Specimen: BLOOD  ?Result Value Ref Range Status  ? Specimen Description BLOOD LEFT ANTECUBITAL  Final  ? Special Requests   Final  ?  BOTTLES DRAWN AEROBIC AND ANAEROBIC Blood Culture results may not be optimal due to an excessive volume of blood received in culture bottles  ? Culture   Final  ?  NO GROWTH 3 DAYS ?Performed at [facility], [address] [place], [place] [address] ?  ? Report Status PENDING  Incomplete  ?Urine Culture     Status: None  ? Collection Time: 05/21/21  8:15 PM  ? Specimen: Urine, Random  ?Result Value Ref Range Status  ? Specimen Description   Final  ?  URINE, RANDOM ?Performed at [facility], [address] [place], [place] [address] ?  ? Special Requests   Final  ?  NONE ?Performed at [facility], [address] [place], [place] [address] ?  ? Culture   Final  ?  NO GROWTH ?Performed at [facility], [place] [address] [place], [place] [address] ?  ? Report Status 05/23/2021 FINAL  Final  ?Culture, blood (Routine X 2) w Reflex to ID Panel     Status: None (Preliminary result)  ? Collection Time: 05/21/21  9:44 PM  ? Specimen: BLOOD  ?Result Value Ref Range Status  ? Specimen Description BLOOD LEFT FOREARM  Final  ? Special Requests   Final  ?  BOTTLES DRAWN AEROBIC AND ANAEROBIC Blood Culture results may not be optimal due to an inadequate volume of blood received in culture bottles  ? Culture   Final  ?  NO GROWTH 3 DAYS ?Performed at [facility], [address] [place], [place] [address] ?  ? Report Status PENDING  Incomplete  ?MRSA Next Gen by PCR, Nasal     Status: None  ? Collection Time: 05/21/21 10:39 PM  ? Specimen: Nasal  Mucosa; Nasal Swab  ?Result Value Ref Range Status  ? MRSA by PCR Next Gen NOT DETECTED NOT DETECTED Final  ?  Comment: (NOTE) ?The GeneXpert MRSA Assay (FDA approved for NASAL specimens only), ?is one component of a comprehensive MRSA colonization surveillance ?program. It is not intended to diagnose MRSA infection nor to guide ?or monitor treatment for MRSA infections. ?Test performance is not FDA approved in patients less than 2 years ?old. ?Performed at [facility], [place], ?[place] [address] ?  ? ? ?Coagulation Studies: ?No results for input(s): LABPROT, INR in the last 72 hours. ? ?Urinalysis: ?Recent Labs  ?  05/21/21 ?1835  ?COLORURINE YELLOW*  ?LABSPEC 1.016  ?PHURINE 5.0  ?GLUCOSEU NEGATIVE  ?HGBUR NEGATIVE  ?BILIRUBINUR NEGATIVE  ?KETONESUR NEGATIVE  ?PROTEINUR 100*  ?NITRITE NEGATIVE  ?LEUKOCYTESUR NEGATIVE  ?  ? ? ?Imaging: ?DG Abd 1 View ? ?Result Date: 05/23/2021 ?CLINICAL DATA:  88 year old female with large bowel obstruction related to hiatal hernia. EXAM: ABDOMEN - 1 VIEW COMPARISON:  CT Abdomen and Pelvis 05/21/2021. FINDINGS: Portable AP supine view at 0539 hours. Nonspecific bowel gas pattern on these two views, with no dilated loops evident, but similar appearance to the scout view on 05/21/2021. Left lung base opacification in part related to large hiatal hernia, intrathoracic stomach. Stable visualized osseous structures. IMPRESSION: Indeterminate bowel-gas pattern not significantly changed since the CT on 05/21/2021. Electronically Signed   By: Genevie Ann M.D.   On: 05/23/2021 06:07   ? ? ?Medications:  ? ? ? Chlorhexidine Gluconate Cloth  6 each Topical Q0600  ? enoxaparin (LOVENOX) injection  30 mg Subcutaneous Q24H  ? levothyroxine  88 mcg Oral Q0600  ? metroNIDAZOLE  500 mg Oral Q12H  ? polyethylene glycol  17 g Oral BID  ? ?docusate sodium ? ?Assessment/ Plan:  ?88 y.o. female with chronic kidney disease stage IIIb baseline GFR 33,  lumbar spinal stenosis,  hypertension, hyperlipidemia, hypothyroidism, chronic constipation, hiatal hernia, admitted on 05/21/2021 for ?Severe sepsis with lactic acidosis (HCC) [A41.9, R65.20, E87.20] ? ?#Acute kidney injury on chronic kidney disease stage IIIb ?AKI secondary to ATN from bowel obstruction ?Renal function has improved significantly and creatinine is now back down to baseline ?No further renal input. ?We will sign off ? ? ? ? LOS: 3 ?Theresa Snyder ?4/15/202311:41 AM ? ?[facility] ?[place], [place] ?[phone] ? ?Note: This note was prepared with  Sales executive. Any transcription errors are unintentional   ?

## 2021-05-24 NOTE — Discharge Summary (Signed)
Physician Discharge Summary  ?Theresa Snyder:[id number] DOB: 10/23/1929 DOA: 05/21/2021 ? ?PCP: [facility] ? ?Admit date: 05/21/2021  ?Discharge date: 05/24/2021 ? ?Admitted From: Inpatient ?Disposition: home ? ?Recommendations for Outpatient Follow-up:  ?Follow up with PCP in 1-2 weeks ?Please obtain BMP/CBC in one week ? ? ?Home Health:No ?Equipment/Devices:no new equipment  ?Discharge Condition:Stable ?CODE STATUS:Full code ?Diet recommendation: Regular healthy diet ? ?Brief/Interim Summary: ?Per admitting history and physical ?presented to the ED with chief complaints of abdominal pain/cramping and near syncopal episode. ?  ?Patients daughter who is currently at the bedside report that patient has been complaining of vague, crampy, lower abdominal pain that had been present for approximately 3 days. She had mild nausea and anorexia, but she denied any emesis. The patient also reported she had not had a bowel movement in 3 days and did not recall passing flatus in the past 24 hours. Pain worsened today  with near syncopal episode prompting her to call EMS. On EMS arrival, patient was found to be hypotensive with BP of 60/30. ?  ?05/22/21- patient is on 73mg levophed, reports throat dryness and horseness, mentating well, NGT suction aspiration active. Hypoactive Bsx4 ?05/23/21- Patient is improved , optimizing for [facility] transfer. ? ?After transfer patient was followed up the following morning and was stable.  Afebrile, blood pressure stable, heart rate stable, patient multiple bowel movements with resolution of small bowel obstruction.  She tolerating diet without complaint.  Patient stable for discharge.  She will complete 5 days of Flagyl.  Plan of care was discussed with patient's son and patient by myself as well as by critical care who are in agreement with plan for discharge. ? ?Discharge Diagnoses:  ?Principal Problem: ?  Severe sepsis with lactic acidosis (HCC) ?Active Problems: ?   Adult hypothyroidism ?  Stage 3b chronic kidney disease ([place] ?  Colonic obstruction ([place] ?  Large hiatal hernia ? ? ? ?Discharge Instructions ? ?Discharge Instructions   ? ? Call MD for:   Complete by: As directed ?  ? For any acute change in medical condition to include but not limited to nausea vomiting fevers chills chest pain or shortness of breath  ? Diet - low sodium heart healthy   Complete by: As directed ?  ? Increase activity slowly   Complete by: As directed ?  ? No wound care   Complete by: As directed ?  ? ?  ? ?Allergies as of 05/24/2021   ? ?   Reactions  ? Iodinated Contrast Media Other (See Comments)  ? Blue, Green and Red dyes   ? Lorazepam   ? Altered mental state  ? Risperidone Other (See Comments)  ? Altered mental state   ? Sulfamethoxazole-trimethoprim   ? Hyper  ? ?  ? ?  ?Medication List  ?  ? ?TAKE these medications   ? ?amLODipine 10 MG tablet ?Commonly known as: NORVASC ?Take 10 mg by mouth daily with breakfast. for high blood pressure ?  ?benazepril 20 MG tablet ?Commonly known as: LOTENSIN ?Take 20 mg by mouth daily. ?  ?docusate sodium 100 MG capsule ?Commonly known as: COLACE ?Take 1 capsule (100 mg total) by mouth 2 (two) times daily as needed for mild constipation. ?  ?gabapentin 300 MG capsule ?Commonly known as: NEURONTIN ?Take 300 mg by mouth daily. ?  ?hydrochlorothiazide 25 MG tablet ?Commonly known as: HYDRODIURIL ?Take 25 mg by mouth daily. ?  ?HYDROcodone-acetaminophen 10-325 MG tablet ?Commonly known as:  [place] ?Take 1 tablet by mouth 2 (two) times daily as needed. ?  ?levothyroxine 88 MCG tablet ?Commonly known as: SYNTHROID ?Take 88 mcg by mouth daily before breakfast. ?  ?meclizine 25 MG tablet ?Commonly known as: ANTIVERT ?Take 12.5-25 mg by mouth 3 (three) times daily as needed for dizziness or nausea. ?  ?metroNIDAZOLE 500 MG tablet ?Commonly known as: FLAGYL ?Take 1 tablet (500 mg total) by mouth every 12 (twelve) hours for 5 days. ?  ?pravastatin 20 MG  tablet ?Commonly known as: PRAVACHOL ?Take 20 mg by mouth at bedtime. ?  ? ?  ? ? Follow-up Information   ? ? [facility] Follow up.   ?Why: Agency will reach out to you to set up your initial visit ?Contact information: ?contact Sharmon Revere with any questions [phone] ? ?  ?  ? ?  ?  ? ?  ? ?Allergies  ?Allergen Reactions  ? Iodinated Contrast Media Other (See Comments)  ?  Blue, Green and Red dyes   ? Lorazepam   ?  Altered mental state  ? Risperidone Other (See Comments)  ?  Altered mental state   ? Sulfamethoxazole-Trimethoprim   ?  Hyper  ? ? ?Consultations: ?Critical care, nephrology ? ? ?Procedures/Studies: ?CT ABDOMEN PELVIS WO CONTRAST ? ?Result Date: 05/21/2021 ?CLINICAL DATA:  Acute abdominal pain and hypotension EXAM: CT ABDOMEN AND PELVIS WITHOUT CONTRAST TECHNIQUE: Multidetector CT imaging of the abdomen and pelvis was performed following the standard protocol without IV contrast. RADIATION DOSE REDUCTION: This exam was performed according to the departmental dose-optimization program which includes automated exposure control, adjustment of the mA and/or kV according to patient size and/or use of iterative reconstruction technique. COMPARISON:  03/23/2020 FINDINGS: Lower chest: Lung bases demonstrate no focal infiltrate. Large hiatal hernia is noted with almost the entire stomach within the chest cavity. Hepatobiliary: No focal liver abnormality is seen. No gallstones, gallbladder wall thickening, or biliary dilatation. Pancreas: Unremarkable. No pancreatic ductal dilatation or surrounding inflammatory changes. Spleen: Normal in size without focal abnormality. Adrenals/Urinary Tract: Adrenal glands are within normal limits. Kidneys show no renal calculi. A few hypodensities are noted within the kidneys consistent with small cysts. These are stable from prior exam require no further follow-up. The bladder is decompressed. Stomach/Bowel: Scattered diverticular change of the colon is noted  without evidence of diverticulitis. The distal transverse colon extends into the hiatal hernia as well. The colon proximal to this is distended with fluid suggestive of a partial obstruction. The small bowel is nondilated. Appendix has been surgically removed. Vascular/Lymphatic: Aortic atherosclerosis. No enlarged abdominal or pelvic lymph nodes. Reproductive: Uterus and bilateral adnexa are unremarkable. Other: No abdominal wall hernia or abnormality. No abdominopelvic ascites. Musculoskeletal: Degenerative changes of the lumbar spine are seen. Chronic compression fracture of L4 is noted. IMPRESSION: Large hiatal hernia with almost the entire stomach within the hernia. Additionally a loop of transverse colon is noted within best visualized on the coronal reconstructions image number 45 of series 5. This causes a partial colonic obstruction with dilatation and diffuse fluid within the more proximal colon. The colonic changes are new from the prior exam. Herniation of the colon was not seen on the prior study. Diverticulosis without diverticulitis. Electronically Signed   By: Inez Catalina M.D.   On: 05/21/2021 19:40  ? ?DG Abd 1 View ? ?Result Date: 05/23/2021 ?CLINICAL DATA:  88 year old female with large bowel obstruction related to hiatal hernia. EXAM: ABDOMEN - 1 VIEW COMPARISON:  CT Abdomen and  Pelvis 05/21/2021. FINDINGS: Portable AP supine view at 0539 hours. Nonspecific bowel gas pattern on these two views, with no dilated loops evident, but similar appearance to the scout view on 05/21/2021. Left lung base opacification in part related to large hiatal hernia, intrathoracic stomach. Stable visualized osseous structures. IMPRESSION: Indeterminate bowel-gas pattern not significantly changed since the CT on 05/21/2021. Electronically Signed   By: Genevie Ann M.D.   On: 05/23/2021 06:07  ? ?DG Abd Portable 1 View ? ?Result Date: 05/21/2021 ?CLINICAL DATA:  Check gastric catheter placement EXAM: PORTABLE ABDOMEN - 1  VIEW COMPARISON:  None. FINDINGS: Gastric catheter is noted coiled within the stomach. Elevated left hemidiaphragm and large hiatal hernia are seen. No free air is noted. IMPRESSION: Gastric catheter within the stomach

## 2021-05-26 LAB — CULTURE, BLOOD (ROUTINE X 2)
Culture: NO GROWTH
Culture: NO GROWTH

## 2021-06-09 ENCOUNTER — Ambulatory Visit: Payer: Medicare Other | Admitting: Surgery

## 2021-06-23 ENCOUNTER — Ambulatory Visit: Payer: Medicare Other | Admitting: Surgery

## 2021-07-09 ENCOUNTER — Other Ambulatory Visit: Payer: Self-pay

## 2021-07-09 ENCOUNTER — Emergency Department: Payer: Medicare Other

## 2021-07-09 ENCOUNTER — Inpatient Hospital Stay
Admission: EM | Admit: 2021-07-09 | Discharge: 2021-07-14 | DRG: 522 | Disposition: A | Discharge status: Skilled Nursing Facility | Payer: Medicare Other | Attending: Internal Medicine | Admitting: Internal Medicine

## 2021-07-09 DIAGNOSIS — E785 Hyperlipidemia, unspecified: Secondary | ICD-10-CM | POA: Diagnosis present

## 2021-07-09 DIAGNOSIS — E039 Hypothyroidism, unspecified: Secondary | ICD-10-CM | POA: Diagnosis present

## 2021-07-09 DIAGNOSIS — N1832 Chronic kidney disease, stage 3b: Secondary | ICD-10-CM | POA: Diagnosis not present

## 2021-07-09 DIAGNOSIS — S72002A Fracture of unspecified part of neck of left femur, initial encounter for closed fracture: Principal | ICD-10-CM | POA: Diagnosis present

## 2021-07-09 DIAGNOSIS — D72829 Elevated white blood cell count, unspecified: Secondary | ICD-10-CM | POA: Diagnosis present

## 2021-07-09 DIAGNOSIS — I10 Essential (primary) hypertension: Secondary | ICD-10-CM | POA: Diagnosis present

## 2021-07-09 DIAGNOSIS — R52 Pain, unspecified: Secondary | ICD-10-CM

## 2021-07-09 DIAGNOSIS — W1830XA Fall on same level, unspecified, initial encounter: Secondary | ICD-10-CM | POA: Diagnosis present

## 2021-07-09 DIAGNOSIS — Z79890 Hormone replacement therapy (postmenopausal): Secondary | ICD-10-CM

## 2021-07-09 DIAGNOSIS — E875 Hyperkalemia: Secondary | ICD-10-CM | POA: Diagnosis not present

## 2021-07-09 DIAGNOSIS — Z751 Person awaiting admission to adequate facility elsewhere: Secondary | ICD-10-CM

## 2021-07-09 DIAGNOSIS — E782 Mixed hyperlipidemia: Secondary | ICD-10-CM | POA: Diagnosis not present

## 2021-07-09 DIAGNOSIS — D631 Anemia in chronic kidney disease: Secondary | ICD-10-CM | POA: Diagnosis present

## 2021-07-09 DIAGNOSIS — I890 Lymphedema, not elsewhere classified: Secondary | ICD-10-CM | POA: Diagnosis present

## 2021-07-09 DIAGNOSIS — D62 Acute posthemorrhagic anemia: Secondary | ICD-10-CM | POA: Diagnosis not present

## 2021-07-09 DIAGNOSIS — I150 Renovascular hypertension: Secondary | ICD-10-CM | POA: Diagnosis not present

## 2021-07-09 DIAGNOSIS — S72009A Fracture of unspecified part of neck of unspecified femur, initial encounter for closed fracture: Secondary | ICD-10-CM | POA: Diagnosis present

## 2021-07-09 DIAGNOSIS — I129 Hypertensive chronic kidney disease with stage 1 through stage 4 chronic kidney disease, or unspecified chronic kidney disease: Secondary | ICD-10-CM | POA: Diagnosis present

## 2021-07-09 LAB — COMPREHENSIVE METABOLIC PANEL
ALT: 22 U/L (ref 0–44)
AST: 52 U/L — ABNORMAL HIGH (ref 15–41)
Albumin: 4.3 g/dL (ref 3.5–5.0)
Alkaline Phosphatase: 59 U/L (ref 38–126)
Anion gap: 11 (ref 5–15)
BUN: 64 mg/dL — ABNORMAL HIGH (ref 8–23)
CO2: 25 mmol/L (ref 22–32)
Calcium: 10.2 mg/dL (ref 8.9–10.3)
Chloride: 104 mmol/L (ref 98–111)
Creatinine, Ser: 1.77 mg/dL — ABNORMAL HIGH (ref 0.44–1.00)
GFR, Estimated: 27 mL/min — ABNORMAL LOW (ref >60)
Glucose, Bld: 182 mg/dL — ABNORMAL HIGH (ref 70–99)
Potassium: 5 mmol/L (ref 3.5–5.1)
Sodium: 140 mmol/L (ref 135–145)
Total Bilirubin: 1.1 mg/dL (ref 0.3–1.2)
Total Protein: 7.2 g/dL (ref 6.5–8.1)

## 2021-07-09 LAB — CBC WITH DIFFERENTIAL/PLATELET
Abs Immature Granulocytes: 0.1 10*3/uL — ABNORMAL HIGH (ref 0.00–0.07)
Basophils Absolute: 0 10*3/uL (ref 0.0–0.1)
Basophils Relative: 0 %
Eosinophils Absolute: 0 10*3/uL (ref 0.0–0.5)
Eosinophils Relative: 0 %
HCT: 37.2 % (ref 36.0–46.0)
Hemoglobin: 12 g/dL (ref 12.0–15.0)
Immature Granulocytes: 1 %
Lymphocytes Relative: 5 %
Lymphs Abs: 1 10*3/uL (ref 0.7–4.0)
MCH: 32.8 pg (ref 26.0–34.0)
MCHC: 32.3 g/dL (ref 30.0–36.0)
MCV: 101.6 fL — ABNORMAL HIGH (ref 80.0–100.0)
Monocytes Absolute: 1 10*3/uL (ref 0.1–1.0)
Monocytes Relative: 5 %
Neutro Abs: 18.9 10*3/uL — ABNORMAL HIGH (ref 1.7–7.7)
Neutrophils Relative %: 89 %
Platelets: 223 10*3/uL (ref 150–400)
RBC: 3.66 MIL/uL — ABNORMAL LOW (ref 3.87–5.11)
RDW: 12.2 % (ref 11.5–15.5)
WBC: 21 10*3/uL — ABNORMAL HIGH (ref 4.0–10.5)
nRBC: 0 % (ref 0.0–0.2)

## 2021-07-09 LAB — SURGICAL PCR SCREEN
MRSA, PCR: NEGATIVE
Staphylococcus aureus: POSITIVE — AB

## 2021-07-09 MED ORDER — LEVOTHYROXINE SODIUM 88 MCG PO TABS
88.0000 ug | ORAL_TABLET | Freq: Every day | ORAL | Status: DC
  Administered 2021-07-11 – 2021-07-14 (×4): 88 ug via ORAL
  Filled 2021-07-09 (×5): qty 1

## 2021-07-09 MED ORDER — AMLODIPINE BESYLATE 10 MG PO TABS
10.0000 mg | ORAL_TABLET | Freq: Every day | ORAL | Status: DC
  Administered 2021-07-11: 10 mg via ORAL
  Filled 2021-07-09: qty 1

## 2021-07-09 MED ORDER — HYDROMORPHONE HCL 1 MG/ML IJ SOLN
1.0000 mg | Freq: Once | INTRAMUSCULAR | Status: AC
  Administered 2021-07-09: 1 mg via INTRAVENOUS
  Filled 2021-07-09: qty 1

## 2021-07-09 MED ORDER — ONDANSETRON HCL 4 MG/2ML IJ SOLN
4.0000 mg | Freq: Once | INTRAMUSCULAR | Status: AC
  Administered 2021-07-09: 4 mg via INTRAVENOUS
  Filled 2021-07-09: qty 2

## 2021-07-09 MED ORDER — MUPIROCIN 2 % EX OINT
1.0000 "application " | TOPICAL_OINTMENT | Freq: Two times a day (BID) | CUTANEOUS | Status: DC
  Administered 2021-07-09 – 2021-07-12 (×6): 1 via NASAL
  Filled 2021-07-09: qty 22

## 2021-07-09 MED ORDER — ONDANSETRON HCL 4 MG/2ML IJ SOLN: 4.0000 mg | Freq: Four times a day (QID) | INTRAMUSCULAR | Status: DC | PRN

## 2021-07-09 MED ORDER — PRAVASTATIN SODIUM 20 MG PO TABS
20.0000 mg | ORAL_TABLET | Freq: Every day | ORAL | Status: DC
  Administered 2021-07-09 – 2021-07-13 (×5): 20 mg via ORAL
  Filled 2021-07-09 (×5): qty 1

## 2021-07-09 MED ORDER — MORPHINE SULFATE (PF) 4 MG/ML IV SOLN
4.0000 mg | Freq: Once | INTRAVENOUS | Status: AC
  Administered 2021-07-09: 4 mg via INTRAVENOUS
  Filled 2021-07-09: qty 1

## 2021-07-09 MED ORDER — ONDANSETRON HCL 4 MG PO TABS: 4.0000 mg | ORAL_TABLET | Freq: Four times a day (QID) | ORAL | Status: DC | PRN

## 2021-07-09 MED ORDER — BENAZEPRIL HCL 20 MG PO TABS
20.0000 mg | ORAL_TABLET | Freq: Every day | ORAL | Status: DC
  Administered 2021-07-11: 20 mg via ORAL
  Filled 2021-07-09 (×2): qty 1

## 2021-07-09 MED ORDER — HYDROCHLOROTHIAZIDE 25 MG PO TABS
25.0000 mg | ORAL_TABLET | Freq: Every day | ORAL | Status: DC
  Administered 2021-07-09 – 2021-07-14 (×5): 25 mg via ORAL
  Filled 2021-07-09 (×5): qty 1

## 2021-07-09 MED ORDER — DOCUSATE SODIUM 100 MG PO CAPS
100.0000 mg | ORAL_CAPSULE | Freq: Two times a day (BID) | ORAL | Status: DC | PRN
  Administered 2021-07-10: 100 mg via ORAL

## 2021-07-09 MED ORDER — GABAPENTIN 300 MG PO CAPS
300.0000 mg | ORAL_CAPSULE | Freq: Every day | ORAL | Status: DC
  Administered 2021-07-09 – 2021-07-14 (×5): 300 mg via ORAL
  Filled 2021-07-09 (×5): qty 1

## 2021-07-09 MED ORDER — ACETAMINOPHEN 650 MG RE SUPP: 650.0000 mg | Freq: Four times a day (QID) | RECTAL | Status: DC | PRN

## 2021-07-09 MED ORDER — ACETAMINOPHEN 325 MG PO TABS
650.0000 mg | ORAL_TABLET | Freq: Four times a day (QID) | ORAL | Status: DC | PRN
  Administered 2021-07-13: 650 mg via ORAL
  Filled 2021-07-09: qty 2

## 2021-07-09 MED ORDER — HYDROMORPHONE HCL 1 MG/ML IJ SOLN
0.5000 mg | INTRAMUSCULAR | Status: DC | PRN
  Administered 2021-07-09 – 2021-07-10 (×3): 1 mg via INTRAVENOUS
  Administered 2021-07-10 (×2): 0.5 mg via INTRAVENOUS
  Filled 2021-07-09 (×5): qty 1

## 2021-07-09 MED ORDER — OXYCODONE HCL 5 MG PO TABS
5.0000 mg | ORAL_TABLET | ORAL | Status: DC | PRN
  Administered 2021-07-09 – 2021-07-13 (×5): 5 mg via ORAL
  Filled 2021-07-09 (×5): qty 1

## 2021-07-09 NOTE — ED Provider Notes (Signed)
[facility] Provider Note    Event Date/Time   First MD Initiated Contact with Patient 07/09/21 1311     (approximate)   History   Chief Complaint: Fall   HPI  Theresa Snyder is a 88 y.o. female with history of CKD, lumbar compression fracture who comes ED complaining of a mechanical fall.  She reports that as she was getting out of bed this morning she lost her balance and fell onto her left side.  She complains of pain in the left hip and the left shoulder.  No head injury, no loss of consciousness, no neck pain.  Pain is constant and severe.  Worse with movement, unable to bear weight.     Physical Exam   Triage Vital Signs: ED Triage Vitals  Enc Vitals Group     BP 07/09/21 1300 116/68     Pulse Rate 07/09/21 1300 97     Resp 07/09/21 1310 20     Temp 07/09/21 1302 (!) 97.1 F (36.2 C)     Temp Source 07/09/21 1302 Axillary     SpO2 07/09/21 1300 96 %     Weight 07/09/21 1307 120 lb (54.4 kg)     Height 07/09/21 1307 5' (1.524 m)     Head Circumference --      Peak Flow --      Pain Score 07/09/21 1302 8     Pain Loc --      Pain Edu? --      Excl. in [place]? --     Most recent vital signs: Vitals:   07/09/21 1416 07/09/21 1418  BP:    Pulse:    Resp:    Temp:    SpO2: 91% 97%    General: Awake, no distress.  CV:  Good peripheral perfusion.  Resp:  Normal effort.  Abd:  No distention.  Other:  Tenderness at left hip.   ED Results / Procedures / Treatments   Labs (all labs ordered are listed, but only abnormal results are displayed) Labs Reviewed  COMPREHENSIVE METABOLIC PANEL  CBC WITH DIFFERENTIAL/PLATELET     EKG Interpreted by me Normal sinus rhythm rate of 95.  Normal axis and intervals.  Normal QRS ST segments and T waves.   RADIOLOGY X-ray left hip viewed and interpreted by me, shows comminuted displaced fracture through left femoral neck.  Radiology report  reviewed   PROCEDURES:  Procedures   MEDICATIONS ORDERED IN ED: Medications  HYDROmorphone (DILAUDID) injection 1 mg (has no administration in time range)  morphine (PF) 4 MG/ML injection 4 mg (4 mg Intravenous Given 07/09/21 1334)  ondansetron (ZOFRAN) injection 4 mg (4 mg Intravenous Given 07/09/21 1333)     IMPRESSION / MDM / ASSESSMENT AND PLAN / ED COURSE  I reviewed the triage vital signs and the nursing notes.                              Differential diagnosis includes, but is not limited to, hip fracture, shoulder dislocation, proximal humerus fracture, pelvis fracture, dehydration, electrolyte abnormality, renal insufficiency, anemia  Patient's presentation is most consistent with acute illness / injury with system symptoms.  Patient presents with left hip pain after mechanical fall.  X-ray shows displaced hip fracture.  Discussed with orthopedics Dr. Sharlet Salina who recommends hemiarthroplasty.  Case discussed with hospitalist for further management.  Patient is n.p.o. since last night at dinnertime.  Pain currently  reasonably well controlled after 4 mg IV morphine and 1 mg IV Dilaudid.       FINAL CLINICAL IMPRESSION(S) / ED DIAGNOSES   Final diagnoses:  Closed left hip fracture, initial encounter [facility])     Rx / DC Orders   ED Discharge Orders     None        Note:  This document was prepared using Dragon voice recognition software and may include unintentional dictation errors.   Carrie Mew, MD 07/09/21 [phone]

## 2021-07-09 NOTE — H&P (Signed)
History and Physical    Patient: Theresa Snyder KVQ:[id number] DOB: [birth year] DOA: 07/09/2021 DOS: the patient was seen and examined on 07/09/2021 PCP: [facility] [place]  Patient coming from: Home  Chief Complaint:  Chief Complaint  Patient presents with   Fall   HPI: Theresa Snyder is a 88 y.o. female with medical history significant of chronic kidney disease stage III/IV, lymphedema, hypertension, hypothyroidism, hyperlipidemia, history of seizure no longer on Keppra, history of large hiatal hernia, ambulatory dysfunction.  This patient presents from home after having a mechanical fall.  She was trying to get of bed and lost her balance.  She fell on her left side and presented to the emergency department with left hip pain and left shoulder pain.  Left shoulder plain film imaging is negative.  Imaging showed a left femoral neck fracture.  ED provider discussed with Dr. Sharlet Salina.  The plan is for left hemiarthroplasty.  Will likely be done tomorrow.  Denies any chest pain.  Denies any shortness of breath.  Denies any urinary complaints.  No cough. No fevers.  Asking for a GI soft diet.  Daughter present at bedside.  The patient lives alone at home.  Pain is well controlled with medications given in the ER.    Review of Systems: As mentioned in the history of present illness. All other systems reviewed and are negative. Past Medical History:  Diagnosis Date   Arthritis    Cancer ([place])    skin   Chronic kidney disease    history of renal insufficiency   History of hiatal hernia    Hypercholesteremia    Hypertension    Hypothyroidism    Lymphedema of leg    Pneumonia    in the past   Seizures ([place]) 2014   no seizures since then   Spinal stenosis    Past Surgical History:  Procedure Laterality Date   APPENDECTOMY     CATARACT EXTRACTION W/PHACO Left 09/25/2015   Procedure: CATARACT EXTRACTION PHACO AND INTRAOCULAR LENS PLACEMENT ([place]);  Surgeon: Estill Cotta, MD;  Location: [facility] ORS;  Service: Ophthalmology;  Laterality: Left;  [place] 01:23 AP% 24.0 CDE 34.98 Fluid pack lot # [id number]   CATARACT EXTRACTION W/PHACO Right 07/01/2016   Procedure: CATARACT EXTRACTION PHACO AND INTRAOCULAR LENS PLACEMENT ([facility]);  Surgeon: Estill Cotta, MD;  Location: [facility] ORS;  Service: Ophthalmology;  Laterality: Right;  [place] 1:25.9 AP% 24.8 CDE 40.61 Fluid Pack Lot # [id number]   TUBAL LIGATION     Social History:  reports that she has never smoked. She has never used smokeless tobacco. She reports that she does not drink alcohol and does not use drugs.  Allergies  Allergen Reactions   Iodinated Contrast Media Other (See Comments)    Blue, Green and Red dyes    Lorazepam     Altered mental state   Risperidone Other (See Comments)    Altered mental state    Sulfamethoxazole-Trimethoprim     Hyper    No family history on file.  Prior to Admission medications   Medication Sig Start Date End Date Taking? Authorizing Provider  amLODipine (NORVASC) 10 MG tablet Take 10 mg by mouth daily with breakfast. for high blood pressure 01/12/15   [provider]  benazepril (LOTENSIN) 20 MG tablet Take 20 mg by mouth daily. 01/12/15   [provider]  docusate sodium (COLACE) 100 MG capsule Take 1 capsule (100 mg total) by mouth 2 (two) times daily as needed  for mild constipation. 05/24/21   Spongberg, Audie Pinto, MD  gabapentin (NEURONTIN) 300 MG capsule Take 300 mg by mouth daily. 01/12/15   [provider]  hydrochlorothiazide (HYDRODIURIL) 25 MG tablet Take 25 mg by mouth daily. 08/15/12   [provider]  HYDROcodone-acetaminophen (NORCO) 10-325 MG tablet Take 1 tablet by mouth 2 (two) times daily as needed. 05/11/21   [provider]  levothyroxine (SYNTHROID, LEVOTHROID) 88 MCG tablet Take 88 mcg by mouth daily before breakfast. 01/12/15   [provider]  meclizine (ANTIVERT) 25 MG tablet Take 12.5-25 mg by  mouth 3 (three) times daily as needed for dizziness or nausea. 02/14/15   [provider]  pravastatin (PRAVACHOL) 20 MG tablet Take 20 mg by mouth at bedtime.     [provider]    Physical Exam: Vitals:   07/09/21 1415 07/09/21 1416 07/09/21 1418 07/09/21 1429  BP:      Pulse:    84  Resp:      Temp:      TempSrc:      SpO2: (!) 85% 91% 97% 98%  Weight:      Height:        General:  Appears calm and comfortable and is in NAD Cardiovascular:  RRR, no m/r/g.  Respiratory:   CTA bilaterally with no wheezes/rales/rhonchi.  Normal respiratory effort. Abdomen:  soft, NT, ND, NABS Skin:  no rash or induration seen on limited exam Musculoskeletal:  grossly normal tone BUE/BLE, good ROM, no bony abnormality Lower extremity: Chronic lymphedema left greater than right. limited foot exam with no ulcerations.  2+ distal pulses.  Left lower extremity shortened and externally rotated.  Neurovascularly intact. Psychiatric:  grossly normal mood and affect, speech fluent and appropriate, Aox2 to name and place. Neurologic:  CN 2-12 grossly intact, moves all extremities in coordinated fashion, sensation intact  Data Reviewed: Left femoral neck fracture plain film imaging.  WBC 21, creatinine 1.77  Assessment and Plan: Left femoral neck fracture: Orthopedics consulted.  Planned left hemiarthroplasty tomorrow. NPO after midnight.  Pain scale ordered.  TOC consulted.  PT/OT ordered.  Hold chemical DVT prophylaxis.  Leukocytosis: Likely reactive.  UA pending.  Chest x-ray shows no pneumonia.  CKD 3B/4: Appears at baseline.  Baseline creatinine appears to be around 1.4-1.8.  Hypertension: Continue home Norvasc, Lotensin, hydrochlorothiazide  Hyperlipidemia: Continue home pravastatin  Hypothyroidism: Continue home Synthroid  Large hiatal hernia: Devious hospitalization requiring intubation and mechanical ventilation due to aspiration pneumonia from large hiatal hernia.  Per  daughter apparently seen by Dr. Dahlia Byes in the outpatient setting and not a candidate for surgical intervention.  Aspiration precautions ordered.  Chronic lymphedema: Appears at baseline.  Continue home HCTZ.  DVT prophylaxis: SCDs   Advance Care Planning: Full code  Consults: Orthopedics  Family Communication: Daughter present at bedside who is her healthcare POA Theresa Snyder [phone])  Severity of Illness: The appropriate patient status for this patient is INPATIENT. Inpatient status is judged to be reasonable and necessary in order to provide the required intensity of service to ensure the patient's safety. The patient's presenting symptoms, physical exam findings, and initial radiographic and laboratory data in the context of their chronic comorbidities is felt to place them at high risk for further clinical deterioration. Furthermore, it is not anticipated that the patient will be medically stable for discharge from the hospital within 2 midnights of admission.   * I certify that at the point of admission it is my clinical judgment  that the patient will require inpatient hospital care spanning beyond 2 midnights from the point of admission due to high intensity of service, high risk for further deterioration and high frequency of surveillance required.*  Author: Leslee Home, DO 07/09/2021 3:07 PM  For on call review [email or web address].

## 2021-07-09 NOTE — ED Notes (Signed)
EKG to [place] in person. Blue, lt grn, red and lav tubes sent to lab.

## 2021-07-09 NOTE — ED Triage Notes (Signed)
Pt in from home via EMS due to fall; denies CP and denies HA; denies dizziness, hitting head, LOC, pt unsure why she fell; reports L shoulder, L leg and L hip pain; pt's resp reg/unlabored, skin dry, and sitting calmly on stretcher. EMS vitals: 113/71; 96% RA; 102 HR; 196 BG; 97.4 temp. Pt reports history of hypotension. Pt denies typical use of cane or walker.

## 2021-07-09 NOTE — Plan of Care (Signed)
  Problem: Education: Goal: Knowledge of General Education information will improve Description: Including pain rating scale, medication(s)/side effects and non-pharmacologic comfort measures Outcome: Progressing   Problem: Health Behavior/Discharge Planning: Goal: Ability to manage health-related needs will improve Outcome: Progressing   Problem: Clinical Measurements: Goal: Respiratory complications will improve Outcome: Progressing Goal: Cardiovascular complication will be avoided Outcome: Progressing   Problem: Nutrition: Goal: Adequate nutrition will be maintained Outcome: Progressing   Problem: Pain Managment: Goal: General experience of comfort will improve Outcome: Progressing   Problem: Safety: Goal: Ability to remain free from injury will improve Outcome: Progressing   Problem: Skin Integrity: Goal: Risk for impaired skin integrity will decrease Outcome: Progressing

## 2021-07-09 NOTE — ED Notes (Signed)
Pt's left foot warm; foot rotated laterally; dorsalis pedis pulse 2+ at L foot.

## 2021-07-09 NOTE — ED Notes (Signed)
Will collect a repeat EKG once pt has had pain meds; pt shaking some which caused lots of artifact in initial EKG.

## 2021-07-09 NOTE — Progress Notes (Signed)
Pt arrived to room 143 via stretcher from the ED. Received report from [place], Therapist, sports. See assessment. Will continue to monitor.

## 2021-07-09 NOTE — ED Notes (Signed)
Provider [place] notified of pt's 8/10 L hip pain. Awaiting orders.

## 2021-07-09 NOTE — ED Notes (Signed)
Pt denies being on blood thinners/anticoagulants. Pt does report feeling mildly SOB.

## 2021-07-09 NOTE — ED Notes (Signed)
Pt educated about purewick. Purewick applied as pt states she needs to urinate.

## 2021-07-09 NOTE — ED Notes (Signed)
Colletta [place] RN called lab and staff states they are running blood-work now.

## 2021-07-10 ENCOUNTER — Other Ambulatory Visit: Payer: Self-pay

## 2021-07-10 ENCOUNTER — Inpatient Hospital Stay: Payer: Medicare Other | Admitting: Anesthesiology

## 2021-07-10 ENCOUNTER — Encounter
Admission: EM | Disposition: A | Discharge status: Skilled Nursing Facility | Payer: Self-pay | Source: Home / Self Care | Attending: Internal Medicine

## 2021-07-10 ENCOUNTER — Inpatient Hospital Stay: Payer: Medicare Other

## 2021-07-10 DIAGNOSIS — I150 Renovascular hypertension: Secondary | ICD-10-CM | POA: Diagnosis not present

## 2021-07-10 DIAGNOSIS — E039 Hypothyroidism, unspecified: Secondary | ICD-10-CM | POA: Diagnosis not present

## 2021-07-10 DIAGNOSIS — E782 Mixed hyperlipidemia: Secondary | ICD-10-CM | POA: Diagnosis not present

## 2021-07-10 DIAGNOSIS — S72002A Fracture of unspecified part of neck of left femur, initial encounter for closed fracture: Principal | ICD-10-CM

## 2021-07-10 DIAGNOSIS — N1832 Chronic kidney disease, stage 3b: Secondary | ICD-10-CM

## 2021-07-10 DIAGNOSIS — I890 Lymphedema, not elsewhere classified: Secondary | ICD-10-CM

## 2021-07-10 HISTORY — PX: HIP ARTHROPLASTY: SHX981

## 2021-07-10 LAB — CBC
HCT: 30.4 % — ABNORMAL LOW (ref 36.0–46.0)
Hemoglobin: 9.9 g/dL — ABNORMAL LOW (ref 12.0–15.0)
MCH: 33.2 pg (ref 26.0–34.0)
MCHC: 32.6 g/dL (ref 30.0–36.0)
MCV: 102 fL — ABNORMAL HIGH (ref 80.0–100.0)
Platelets: 182 10*3/uL (ref 150–400)
RBC: 2.98 MIL/uL — ABNORMAL LOW (ref 3.87–5.11)
RDW: 12.3 % (ref 11.5–15.5)
WBC: 14.3 10*3/uL — ABNORMAL HIGH (ref 4.0–10.5)
nRBC: 0 % (ref 0.0–0.2)

## 2021-07-10 LAB — COMPREHENSIVE METABOLIC PANEL
ALT: 18 U/L (ref 0–44)
AST: 32 U/L (ref 15–41)
Albumin: 3.2 g/dL — ABNORMAL LOW (ref 3.5–5.0)
Alkaline Phosphatase: 44 U/L (ref 38–126)
Anion gap: 3 — ABNORMAL LOW (ref 5–15)
BUN: 59 mg/dL — ABNORMAL HIGH (ref 8–23)
CO2: 29 mmol/L (ref 22–32)
Calcium: 8.8 mg/dL — ABNORMAL LOW (ref 8.9–10.3)
Chloride: 104 mmol/L (ref 98–111)
Creatinine, Ser: 2.01 mg/dL — ABNORMAL HIGH (ref 0.44–1.00)
GFR, Estimated: 23 mL/min — ABNORMAL LOW (ref >60)
Glucose, Bld: 99 mg/dL (ref 70–99)
Potassium: 6 mmol/L — ABNORMAL HIGH (ref 3.5–5.1)
Sodium: 136 mmol/L (ref 135–145)
Total Bilirubin: 0.9 mg/dL (ref 0.3–1.2)
Total Protein: 5.6 g/dL — ABNORMAL LOW (ref 6.5–8.1)

## 2021-07-10 LAB — PROTIME-INR
INR: 1.1 (ref 0.8–1.2)
Prothrombin Time: 14.3 seconds (ref 11.4–15.2)

## 2021-07-10 LAB — APTT: aPTT: 34 seconds (ref 24–36)

## 2021-07-10 LAB — POTASSIUM: Potassium: 5.5 mmol/L — ABNORMAL HIGH (ref 3.5–5.1)

## 2021-07-10 LAB — GLUCOSE, CAPILLARY
Glucose-Capillary: 104 mg/dL — ABNORMAL HIGH (ref 70–99)
Glucose-Capillary: 103 mg/dL — ABNORMAL HIGH (ref 70–99)

## 2021-07-10 SURGERY — HEMIARTHROPLASTY, HIP, DIRECT ANTERIOR APPROACH, FOR FRACTURE
Anesthesia: General | Site: Hip | Laterality: Left

## 2021-07-10 MED ORDER — DEXAMETHASONE SODIUM PHOSPHATE 10 MG/ML IJ SOLN
INTRAMUSCULAR | Status: DC | PRN
  Administered 2021-07-10: 5 mg via INTRAVENOUS

## 2021-07-10 MED ORDER — ACETAMINOPHEN 325 MG PO TABS: 325.0000 mg | ORAL_TABLET | Freq: Four times a day (QID) | ORAL | Status: DC | PRN

## 2021-07-10 MED ORDER — FENTANYL CITRATE (PF) 100 MCG/2ML IJ SOLN
INTRAMUSCULAR | Status: AC
  Filled 2021-07-10: qty 2

## 2021-07-10 MED ORDER — CEFAZOLIN SODIUM-DEXTROSE 2-4 GM/100ML-% IV SOLN
2.0000 g | Freq: Two times a day (BID) | INTRAVENOUS | Status: AC
  Administered 2021-07-11: 2 g via INTRAVENOUS
  Filled 2021-07-10: qty 100

## 2021-07-10 MED ORDER — DEXAMETHASONE SODIUM PHOSPHATE 10 MG/ML IJ SOLN
INTRAMUSCULAR | Status: AC
  Filled 2021-07-10: qty 1

## 2021-07-10 MED ORDER — HYDROCODONE-ACETAMINOPHEN 7.5-325 MG PO TABS
1.0000 | ORAL_TABLET | ORAL | Status: DC | PRN
  Administered 2021-07-11 – 2021-07-14 (×2): 1 via ORAL
  Filled 2021-07-10 (×2): qty 1
  Filled 2021-07-10: qty 2
  Filled 2021-07-10: qty 1

## 2021-07-10 MED ORDER — ENOXAPARIN SODIUM 30 MG/0.3ML IJ SOSY: 30.0000 mg | PREFILLED_SYRINGE | INTRAMUSCULAR | Status: DC

## 2021-07-10 MED ORDER — ADULT MULTIVITAMIN W/MINERALS CH
1.0000 | ORAL_TABLET | Freq: Every day | ORAL | Status: DC
  Administered 2021-07-11 – 2021-07-14 (×4): 1 via ORAL
  Filled 2021-07-10 (×6): qty 1

## 2021-07-10 MED ORDER — TRANEXAMIC ACID-NACL 1000-0.7 MG/100ML-% IV SOLN
INTRAVENOUS | Status: AC
  Filled 2021-07-10: qty 100

## 2021-07-10 MED ORDER — SODIUM CHLORIDE FLUSH 0.9 % IV SOLN
INTRAVENOUS | Status: AC
  Filled 2021-07-10: qty 10

## 2021-07-10 MED ORDER — MORPHINE SULFATE (PF) 2 MG/ML IV SOLN
0.5000 mg | INTRAVENOUS | Status: DC | PRN
  Administered 2021-07-10 – 2021-07-12 (×3): 1 mg via INTRAVENOUS
  Filled 2021-07-10 (×3): qty 1

## 2021-07-10 MED ORDER — ONDANSETRON HCL 4 MG/2ML IJ SOLN: 4.0000 mg | Freq: Four times a day (QID) | INTRAMUSCULAR | Status: DC | PRN

## 2021-07-10 MED ORDER — PHENYLEPHRINE HCL (PRESSORS) 10 MG/ML IV SOLN
INTRAVENOUS | Status: DC | PRN
  Administered 2021-07-10 (×2): 160 ug via INTRAVENOUS

## 2021-07-10 MED ORDER — OXYCODONE HCL 5 MG/5ML PO SOLN: 5.0000 mg | Freq: Once | ORAL | Status: DC | PRN

## 2021-07-10 MED ORDER — FENTANYL CITRATE (PF) 100 MCG/2ML IJ SOLN
INTRAMUSCULAR | Status: DC | PRN
  Administered 2021-07-10: 25 ug via INTRAVENOUS
  Administered 2021-07-10: 50 ug via INTRAVENOUS
  Administered 2021-07-10: 25 ug via INTRAVENOUS

## 2021-07-10 MED ORDER — PHENYLEPHRINE HCL-NACL 20-0.9 MG/250ML-% IV SOLN
INTRAVENOUS | Status: DC | PRN
  Administered 2021-07-10: 40 ug/min via INTRAVENOUS

## 2021-07-10 MED ORDER — LIDOCAINE HCL (CARDIAC) PF 100 MG/5ML IV SOSY
PREFILLED_SYRINGE | INTRAVENOUS | Status: DC | PRN
  Administered 2021-07-10: 40 mg via INTRAVENOUS

## 2021-07-10 MED ORDER — ONDANSETRON HCL 4 MG/2ML IJ SOLN
INTRAMUSCULAR | Status: AC
  Filled 2021-07-10: qty 2

## 2021-07-10 MED ORDER — PROPOFOL 10 MG/ML IV BOLUS
INTRAVENOUS | Status: DC | PRN
  Administered 2021-07-10: 50 mg via INTRAVENOUS

## 2021-07-10 MED ORDER — TRANEXAMIC ACID 1000 MG/10ML IV SOLN
INTRAVENOUS | Status: AC
  Filled 2021-07-10: qty 20

## 2021-07-10 MED ORDER — VANCOMYCIN HCL 1 G IV SOLR
INTRAVENOUS | Status: DC | PRN
  Administered 2021-07-10: 1000 mg

## 2021-07-10 MED ORDER — PHENOL 1.4 % MT LIQD: 1.0000 | OROMUCOSAL | Status: DC | PRN

## 2021-07-10 MED ORDER — SODIUM CHLORIDE 0.9 % IR SOLN: Status: DC | PRN

## 2021-07-10 MED ORDER — BUPIVACAINE-EPINEPHRINE (PF) 0.5% -1:200000 IJ SOLN
INTRAMUSCULAR | Status: DC | PRN
  Administered 2021-07-10: 30 mL

## 2021-07-10 MED ORDER — ROCURONIUM BROMIDE 10 MG/ML (PF) SYRINGE
PREFILLED_SYRINGE | INTRAVENOUS | Status: AC
  Filled 2021-07-10: qty 10

## 2021-07-10 MED ORDER — ALBUMIN HUMAN 5 % IV SOLN
INTRAVENOUS | Status: AC
  Filled 2021-07-10: qty 250

## 2021-07-10 MED ORDER — CHLORHEXIDINE GLUCONATE CLOTH 2 % EX PADS
6.0000 | MEDICATED_PAD | Freq: Every day | CUTANEOUS | Status: DC
  Administered 2021-07-11: 6 via TOPICAL

## 2021-07-10 MED ORDER — PROPOFOL 10 MG/ML IV BOLUS
INTRAVENOUS | Status: AC
  Filled 2021-07-10: qty 20

## 2021-07-10 MED ORDER — MENTHOL 3 MG MT LOZG: 1.0000 | LOZENGE | OROMUCOSAL | Status: DC | PRN

## 2021-07-10 MED ORDER — EPHEDRINE SULFATE (PRESSORS) 50 MG/ML IJ SOLN
INTRAMUSCULAR | Status: DC | PRN
  Administered 2021-07-10: 5 mg via INTRAVENOUS
  Administered 2021-07-10: 10 mg via INTRAVENOUS

## 2021-07-10 MED ORDER — ONDANSETRON HCL 4 MG/2ML IJ SOLN: 4.0000 mg | Freq: Once | INTRAMUSCULAR | Status: DC | PRN

## 2021-07-10 MED ORDER — ONDANSETRON HCL 4 MG PO TABS: 4.0000 mg | ORAL_TABLET | Freq: Four times a day (QID) | ORAL | Status: DC | PRN

## 2021-07-10 MED ORDER — ACETAMINOPHEN 500 MG PO TABS
500.0000 mg | ORAL_TABLET | Freq: Four times a day (QID) | ORAL | Status: AC
  Administered 2021-07-10 – 2021-07-11 (×4): 500 mg via ORAL
  Filled 2021-07-10 (×4): qty 1

## 2021-07-10 MED ORDER — EPHEDRINE 5 MG/ML INJ
INTRAVENOUS | Status: AC
  Filled 2021-07-10: qty 5

## 2021-07-10 MED ORDER — PHENYLEPHRINE 80 MCG/ML (10ML) SYRINGE FOR IV PUSH (FOR BLOOD PRESSURE SUPPORT)
PREFILLED_SYRINGE | INTRAVENOUS | Status: AC
  Filled 2021-07-10: qty 10

## 2021-07-10 MED ORDER — ROCURONIUM BROMIDE 100 MG/10ML IV SOLN
INTRAVENOUS | Status: DC | PRN
  Administered 2021-07-10: 50 mg via INTRAVENOUS

## 2021-07-10 MED ORDER — ONDANSETRON HCL 4 MG/2ML IJ SOLN
INTRAMUSCULAR | Status: DC | PRN
  Administered 2021-07-10 (×2): 4 mg via INTRAVENOUS

## 2021-07-10 MED ORDER — CEFAZOLIN SODIUM-DEXTROSE 2-4 GM/100ML-% IV SOLN
INTRAVENOUS | Status: AC
  Filled 2021-07-10: qty 100

## 2021-07-10 MED ORDER — METOCLOPRAMIDE HCL 5 MG/ML IJ SOLN: 5.0000 mg | Freq: Three times a day (TID) | INTRAMUSCULAR | Status: DC | PRN

## 2021-07-10 MED ORDER — HEPARIN SODIUM (PORCINE) 5000 UNIT/ML IJ SOLN
5000.0000 [IU] | Freq: Two times a day (BID) | INTRAMUSCULAR | Status: DC
  Administered 2021-07-10 – 2021-07-14 (×8): 5000 [IU] via SUBCUTANEOUS
  Filled 2021-07-10 (×8): qty 1

## 2021-07-10 MED ORDER — ACETAMINOPHEN 10 MG/ML IV SOLN: 1000.0000 mg | Freq: Once | INTRAVENOUS | Status: DC | PRN

## 2021-07-10 MED ORDER — LACTATED RINGERS IV SOLN: INTRAVENOUS | Status: DC

## 2021-07-10 MED ORDER — SODIUM ZIRCONIUM CYCLOSILICATE 10 G PO PACK
10.0000 g | PACK | Freq: Once | ORAL | Status: AC
  Administered 2021-07-10: 10 g via ORAL
  Filled 2021-07-10: qty 1

## 2021-07-10 MED ORDER — VANCOMYCIN HCL 1000 MG IV SOLR
INTRAVENOUS | Status: AC
  Filled 2021-07-10: qty 20

## 2021-07-10 MED ORDER — HYDROCODONE-ACETAMINOPHEN 5-325 MG PO TABS: 1.0000 | ORAL_TABLET | ORAL | Status: DC | PRN

## 2021-07-10 MED ORDER — NEOMYCIN-POLYMYXIN B GU 40-200000 IR SOLN
Status: AC
  Filled 2021-07-10: qty 20

## 2021-07-10 MED ORDER — METOCLOPRAMIDE HCL 5 MG PO TABS: 5.0000 mg | ORAL_TABLET | Freq: Three times a day (TID) | ORAL | Status: DC | PRN

## 2021-07-10 MED ORDER — ENSURE ENLIVE PO LIQD
237.0000 mL | Freq: Three times a day (TID) | ORAL | Status: DC
  Administered 2021-07-11 – 2021-07-14 (×8): 237 mL via ORAL

## 2021-07-10 MED ORDER — LACTATED RINGERS IV SOLN: INTRAVENOUS | Status: DC | PRN

## 2021-07-10 MED ORDER — TRANEXAMIC ACID-NACL 1000-0.7 MG/100ML-% IV SOLN: 1000.0000 mg | Freq: Once | INTRAVENOUS | Status: DC

## 2021-07-10 MED ORDER — DOCUSATE SODIUM 100 MG PO CAPS
100.0000 mg | ORAL_CAPSULE | Freq: Two times a day (BID) | ORAL | Status: DC
  Administered 2021-07-11 – 2021-07-14 (×7): 100 mg via ORAL
  Filled 2021-07-10 (×8): qty 1

## 2021-07-10 MED ORDER — TRANEXAMIC ACID-NACL 1000-0.7 MG/100ML-% IV SOLN
INTRAVENOUS | Status: DC | PRN
  Administered 2021-07-10 (×2): 1000 mg via INTRAVENOUS

## 2021-07-10 MED ORDER — FENTANYL CITRATE (PF) 100 MCG/2ML IJ SOLN: 25.0000 ug | INTRAMUSCULAR | Status: DC | PRN

## 2021-07-10 MED ORDER — BUPIVACAINE-EPINEPHRINE (PF) 0.5% -1:200000 IJ SOLN
INTRAMUSCULAR | Status: AC
  Filled 2021-07-10: qty 30

## 2021-07-10 MED ORDER — PHENYLEPHRINE 80 MCG/ML (10ML) SYRINGE FOR IV PUSH (FOR BLOOD PRESSURE SUPPORT)
PREFILLED_SYRINGE | INTRAVENOUS | Status: DC | PRN
  Administered 2021-07-10: 80 ug via INTRAVENOUS
  Administered 2021-07-10: 160 ug via INTRAVENOUS

## 2021-07-10 MED ORDER — ACETAMINOPHEN 10 MG/ML IV SOLN
INTRAVENOUS | Status: AC
  Filled 2021-07-10: qty 100

## 2021-07-10 MED ORDER — OXYCODONE HCL 5 MG PO TABS: 5.0000 mg | ORAL_TABLET | Freq: Once | ORAL | Status: DC | PRN

## 2021-07-10 MED ORDER — LIDOCAINE HCL (PF) 2 % IJ SOLN
INTRAMUSCULAR | Status: AC
  Filled 2021-07-10: qty 5

## 2021-07-10 MED ORDER — ACETAMINOPHEN 10 MG/ML IV SOLN
INTRAVENOUS | Status: DC | PRN
  Administered 2021-07-10: 650 mg via INTRAVENOUS

## 2021-07-10 MED ORDER — CEFAZOLIN SODIUM-DEXTROSE 2-4 GM/100ML-% IV SOLN
2.0000 g | INTRAVENOUS | Status: AC
  Administered 2021-07-10: 2 g via INTRAVENOUS

## 2021-07-10 MED ORDER — SUGAMMADEX SODIUM 200 MG/2ML IV SOLN
INTRAVENOUS | Status: DC | PRN
  Administered 2021-07-10: 100 mg via INTRAVENOUS

## 2021-07-10 SURGICAL SUPPLY — 69 items
BLADE SAGITTAL WIDE XTHICK NO (BLADE) ×2 IMPLANT
BNDG COHESIVE 4X5 TAN ST LF (GAUZE/BANDAGES/DRESSINGS) ×2 IMPLANT
CEMENT BONE 40GM (Cement) ×4 IMPLANT
CEMENTRALIZER 9.25MM (Orthopedic Implant) ×1 IMPLANT
COVER BACK TABLE REUSABLE LG (DRAPES) ×2 IMPLANT
DRAPE 3/4 80X56 (DRAPES) ×4 IMPLANT
DRAPE IMP U-DRAPE 54X76 (DRAPES) ×2 IMPLANT
DRAPE INCISE IOBAN 66X45 STRL (DRAPES) ×2 IMPLANT
DRAPE INCISE IOBAN 66X60 STRL (DRAPES) ×2 IMPLANT
DRAPE ORTHO SPLIT 77X108 STRL (DRAPES) ×4
DRAPE SURG 17X11 SM STRL (DRAPES) ×2 IMPLANT
DRAPE SURG ORHT 6 SPLT 77X108 (DRAPES) ×2 IMPLANT
DRSG AQUACEL AG ADV 3.5X10 (GAUZE/BANDAGES/DRESSINGS) ×2 IMPLANT
DRSG OPSITE POSTOP 4X10 (GAUZE/BANDAGES/DRESSINGS) ×1 IMPLANT
DURAPREP 26ML APPLICATOR (WOUND CARE) ×4 IMPLANT
ELECT CAUTERY BLADE 6.4 (BLADE) ×2 IMPLANT
ELECT REM PT RETURN 9FT ADLT (ELECTROSURGICAL) ×2
ELECTRODE REM PT RTRN 9FT ADLT (ELECTROSURGICAL) ×1 IMPLANT
GAUZE 4X4 16PLY ~~LOC~~+RFID DBL (SPONGE) ×2 IMPLANT
GAUZE SPONGE 4X4 12PLY STRL (GAUZE/BANDAGES/DRESSINGS) ×2 IMPLANT
GAUZE XEROFORM 1X8 LF (GAUZE/BANDAGES/DRESSINGS) ×2 IMPLANT
GLOVE BIO SURGEON STRL SZ7.5 (GLOVE) ×2 IMPLANT
GLOVE SURG UNDER POLY LF SZ7.5 (GLOVE) ×2 IMPLANT
GOWN STRL REUS W/ TWL XL LVL3 (GOWN DISPOSABLE) ×1 IMPLANT
GOWN STRL REUS W/TWL XL LVL3 (GOWN DISPOSABLE) ×2
HEAD FEM UNIPOLAR 44 OD STRL (Hips) ×1 IMPLANT
HEMOVAC 400ML (MISCELLANEOUS)
HOLSTER ELECTROSUGICAL PENCIL (MISCELLANEOUS) ×2 IMPLANT
HOOD PEEL AWAY FLYTE STAYCOOL (MISCELLANEOUS) ×3 IMPLANT
IV NS IRRIG 3000ML ARTHROMATIC (IV SOLUTION) ×2 IMPLANT
KIT DRAIN HEMOVAC JP 7FR 400ML (MISCELLANEOUS) IMPLANT
KIT TURNOVER KIT A (KITS) ×2 IMPLANT
MANIFOLD NEPTUNE II (INSTRUMENTS) ×3 IMPLANT
NDL FILTER BLUNT 18X1 1/2 (NEEDLE) ×1 IMPLANT
NDL MAYO CATGUT SZ4 TPR NDL (NEEDLE) ×1 IMPLANT
NDL SAFETY ECLIPSE 18X1.5 (NEEDLE) ×1 IMPLANT
NEEDLE FILTER BLUNT 18X 1/2SAF (NEEDLE)
NEEDLE FILTER BLUNT 18X1 1/2 (NEEDLE) IMPLANT
NEEDLE HYPO 18GX1.5 SHARP (NEEDLE) ×2
NEEDLE HYPO 22GX1.5 SAFETY (NEEDLE) ×1 IMPLANT
NEEDLE MAYO CATGUT SZ4 (NEEDLE) ×2 IMPLANT
NS IRRIG 1000ML POUR BTL (IV SOLUTION) ×2 IMPLANT
PACK HIP PROSTHESIS (MISCELLANEOUS) ×2 IMPLANT
PILLOW ABDUCTION FOAM SM (MISCELLANEOUS) ×2 IMPLANT
PRESSURIZER FEM CANAL M (MISCELLANEOUS) IMPLANT
PULSAVAC PLUS IRRIG FAN TIP (DISPOSABLE) ×2
RETRIEVER SUT HEWSON (MISCELLANEOUS) ×2 IMPLANT
SPACER FEM TAPERED +0 12/14 (Hips) ×1 IMPLANT
SPONGE T-LAP 18X18 ~~LOC~~+RFID (SPONGE) ×4 IMPLANT
STAPLER SKIN PROX 35W (STAPLE) ×2 IMPLANT
STEM SUMMIT CEMENTED BASIC (Hips) ×1 IMPLANT
SUT ETHIBOND 2 V 37 (SUTURE) ×2 IMPLANT
SUT QUILL PDO 0 36 36 VIOLET (SUTURE) ×2 IMPLANT
SUT TICRON 2-0 30IN 311381 (SUTURE) ×6 IMPLANT
SUT VIC AB 0 CT1 36 (SUTURE) ×2 IMPLANT
SUT VIC AB 2-0 CT1 27 (SUTURE) ×4
SUT VIC AB 2-0 CT1 TAPERPNT 27 (SUTURE) ×2 IMPLANT
SUT VIC AB 2-0 CT2 27 (SUTURE) ×4 IMPLANT
SYR 10ML LL (SYRINGE) ×2 IMPLANT
SYR 30ML LL (SYRINGE) ×1 IMPLANT
TAPE MICROFOAM 4IN (TAPE) IMPLANT
TAPE TRANSPORE STRL 2 31045 (GAUZE/BANDAGES/DRESSINGS) ×2 IMPLANT
TIP BRUSH PULSAVAC PLUS 24.33 (MISCELLANEOUS) ×2 IMPLANT
TIP FAN IRRIG PULSAVAC PLUS (DISPOSABLE) ×1 IMPLANT
TOWER CARTRIDGE SMART MIX (DISPOSABLE) IMPLANT
TRAY FOLEY SLVR 16FR LF STAT (SET/KITS/TRAYS/PACK) ×2 IMPLANT
TUBE SUCT KAM VAC (TUBING) IMPLANT
WATER STERILE IRR 1000ML POUR (IV SOLUTION) ×1 IMPLANT
WATER STERILE IRR 500ML POUR (IV SOLUTION) ×1 IMPLANT

## 2021-07-10 NOTE — Plan of Care (Signed)
Pt alert and oriented x 3. Noted to have some slight confusion with time during night. Per daughter this has been her normal behavior. Pt has taken 2 doses of pain medication this shift. SCD on. Updated daughter this am.  Problem: Education: Goal: Expressions of having a comfortable level of knowledge regarding the disease process will increase Outcome: Progressing   Problem: Coping: Goal: Ability to adjust to condition or change in health will improve Outcome: Progressing Goal: Ability to identify appropriate support needs will improve Outcome: Progressing   Problem: Health Behavior/Discharge Planning: Goal: Compliance with prescribed medication regimen will improve Outcome: Progressing   Problem: Medication: Goal: Risk for medication side effects will decrease Outcome: Progressing   Problem: Clinical Measurements: Goal: Complications related to the disease process, condition or treatment will be avoided or minimized Outcome: Progressing Goal: Diagnostic test results will improve Outcome: Progressing   Problem: Safety: Goal: Verbalization of understanding the information provided will improve Outcome: Progressing   Problem: Self-Concept: Goal: Level of anxiety will decrease Outcome: Progressing Goal: Ability to verbalize feelings about condition will improve Outcome: Progressing   Problem: Education: Goal: Knowledge of General Education information will improve Description: Including pain rating scale, medication(s)/side effects and non-pharmacologic comfort measures Outcome: Progressing   Problem: Health Behavior/Discharge Planning: Goal: Ability to manage health-related needs will improve Outcome: Progressing   Problem: Clinical Measurements: Goal: Ability to maintain clinical measurements within normal limits will improve Outcome: Progressing Goal: Will remain free from infection Outcome: Progressing Goal: Diagnostic test results will improve Outcome:  Progressing Goal: Respiratory complications will improve Outcome: Progressing Goal: Cardiovascular complication will be avoided Outcome: Progressing   Problem: Activity: Goal: Risk for activity intolerance will decrease Outcome: Progressing   Problem: Nutrition: Goal: Adequate nutrition will be maintained Outcome: Progressing   Problem: Coping: Goal: Level of anxiety will decrease Outcome: Progressing   Problem: Elimination: Goal: Will not experience complications related to bowel motility Outcome: Progressing Goal: Will not experience complications related to urinary retention Outcome: Progressing   Problem: Pain Managment: Goal: General experience of comfort will improve Outcome: Progressing   Problem: Safety: Goal: Ability to remain free from injury will improve Outcome: Progressing   Problem: Skin Integrity: Goal: Risk for impaired skin integrity will decrease Outcome: Progressing

## 2021-07-10 NOTE — Anesthesia Preprocedure Evaluation (Addendum)
Anesthesia Evaluation  Patient identified by MRN, date of birth, ID band Patient confused    Reviewed: Allergy & Precautions, NPO status , Patient's Chart, lab work & pertinent test results  History of Anesthesia Complications Negative for: history of anesthetic complications  Airway Mallampati: III  TM Distance: >3 FB Neck ROM: Full    Dental  (+) Missing, Chipped, Dental Advisory Given   Pulmonary neg sleep apnea, pneumonia (recent admission for aspiration PNA likely 2/2 hiatal hernia), resolved, neg COPD, Patient abstained from smoking.Not current smoker,  Patient with aspiration PNA 2/2 large hiatal hernia and opioid use in April, Resolved. Patient does still have some residual shortness of breath from the hernia   Pulmonary exam normal breath sounds clear to auscultation       Cardiovascular Exercise Tolerance: Good METShypertension, Pt. on medications (-) CAD and (-) Past MI (-) dysrhythmias  Rhythm:Regular Rate:Normal - Systolic murmurs ECG 1/61/09: normal   Neuro/Psych Seizures - (last sz 2014), Well Controlled,  negative psych ROS   GI/Hepatic hiatal hernia, neg GERD  ,(+)     (-) substance abuse  , Recent bowel obstruction in April, improved without surgical intervention.   Endo/Other  neg diabetesHypothyroidism   Renal/GU CRFRenal disease (stage III CKD)     Musculoskeletal   Abdominal   Peds  Hematology   Anesthesia Other Findings Past Medical History: No date: Arthritis No date: Cancer ([place])     Comment:  skin No date: Chronic kidney disease     Comment:  history of renal insufficiency No date: History of hiatal hernia No date: Hypercholesteremia No date: Hypertension No date: Hypothyroidism No date: Lymphedema of leg No date: Pneumonia     Comment:  in the past 2014: Seizures ([place])     Comment:  no seizures since then No date: Spinal stenosis  Reproductive/Obstetrics                             Anesthesia Physical Anesthesia Plan  ASA: 3  Anesthesia Plan: General   Post-op Pain Management: Ofirmev IV (intra-op)*   Induction: Intravenous and Rapid sequence  PONV Risk Score and Plan: 4 or greater and Propofol infusion, TIVA, Treatment may vary due to age or medical condition, Ondansetron and Dexamethasone  Airway Management Planned: Oral ETT  Additional Equipment: None  Intra-op Plan:   Post-operative Plan: Extubation in OR  Informed Consent: I have reviewed the patients History and Physical, chart, labs and discussed the procedure including the risks, benefits and alternatives for the proposed anesthesia with the patient or authorized representative who has indicated his/her understanding and acceptance.     Dental advisory given and Consent reviewed with POA  Plan Discussed with: CRNA and Surgeon  Anesthesia Plan Comments: (Discussed risks of anesthesia with patient and bedside son and daughter (patient somewhat confused), including PONV, sore throat, lip/dental/eye damage, post operative cognitive dysfunction. Rare risks discussed as well, such as cardiorespiratory and neurological sequelae, aspiration, and allergic reactions. Discussed the role of CRNA in patient's perioperative care. Patient understands.  Due to recent aspiration PNA and presence of large hiatal hernia, I am reluctant to proceed with spinal and natural airway. Discussed as much with patient and children who understand.)     Anesthesia Quick Evaluation

## 2021-07-10 NOTE — Consult Note (Signed)
ORTHOPAEDIC CONSULTATION  REQUESTING PHYSICIAN: Swayze, Ava, DO  Chief Complaint: Left femoral neck fracture  HPI: Theresa Snyder is a 88 y.o. female who complains of left hip pain after mechanical fall while getting out of bed yesterday. The pain is sharp in character.  Denies any numbness, tingling or constitutional symptoms.  Patient lives at home alone and states her son lives nearby.  She uses a cane or walker for ambulation.  She is not on any anticoagulation.   Past Medical History:  Diagnosis Date   Arthritis    Cancer ([place])    skin   Chronic kidney disease    history of renal insufficiency   History of hiatal hernia    Hypercholesteremia    Hypertension    Hypothyroidism    Lymphedema of leg    Pneumonia    in the past   Seizures ([place]) 2014   no seizures since then   Spinal stenosis    Past Surgical History:  Procedure Laterality Date   APPENDECTOMY     CATARACT EXTRACTION W/PHACO Left 09/25/2015   Procedure: CATARACT EXTRACTION PHACO AND INTRAOCULAR LENS PLACEMENT ([place]);  Surgeon: Estill Cotta, MD;  Location: [facility] ORS;  Service: Ophthalmology;  Laterality: Left;  [place] 01:23 AP% 24.0 CDE 34.98 Fluid pack lot # [id number]   CATARACT EXTRACTION W/PHACO Right 07/01/2016   Procedure: CATARACT EXTRACTION PHACO AND INTRAOCULAR LENS PLACEMENT ([facility]);  Surgeon: Estill Cotta, MD;  Location: [facility] ORS;  Service: Ophthalmology;  Laterality: Right;  [place] 1:25.9 AP% 24.8 CDE 40.61 Fluid Pack Lot # [id number]   TUBAL LIGATION     Social History   Socioeconomic History   Marital status: Divorced    Spouse name: Not on file   Number of children: Not on file   Years of education: Not on file   Highest education level: Not on file  Occupational History   Not on file  Tobacco Use   Smoking status: Never   Smokeless tobacco: Never  Substance and Sexual Activity   Alcohol use: No    Alcohol/week: 0.0 standard drinks   Drug use: No   Sexual activity: Not on file   Other Topics Concern   Not on file  Social History Narrative   Not on file   Social Determinants of Health   Financial Resource Strain: Not on file  Food Insecurity: Not on file  Transportation Needs: Not on file  Physical Activity: Not on file  Stress: Not on file  Social Connections: Not on file   No family history on file. Allergies  Allergen Reactions   Iodinated Contrast Media Other (See Comments)    Blue, Green and Red dyes    Lorazepam     Altered mental state   Risperidone Other (See Comments)    Altered mental state    Sulfamethoxazole-Trimethoprim     Hyper   Prior to Admission medications   Medication Sig Start Date End Date Taking? Authorizing Provider  amLODipine (NORVASC) 10 MG tablet Take 10 mg by mouth daily with breakfast. for high blood pressure 01/12/15  Yes [provider]  benazepril (LOTENSIN) 20 MG tablet Take 20 mg by mouth daily. 01/12/15  Yes [provider]  docusate sodium (COLACE) 100 MG capsule Take 1 capsule (100 mg total) by mouth 2 (two) times daily as needed for mild constipation. 05/24/21  Yes Spongberg, Audie Pinto, MD  gabapentin (NEURONTIN) 300 MG capsule Take 300 mg by mouth daily. 01/12/15  Yes [provider]  hydrochlorothiazide (  HYDRODIURIL) 25 MG tablet Take 25 mg by mouth daily. 08/15/12  Yes [provider]  HYDROcodone-acetaminophen (NORCO) 10-325 MG tablet Take 1 tablet by mouth 2 (two) times daily as needed. 05/11/21  Yes [provider]  levothyroxine (SYNTHROID, LEVOTHROID) 88 MCG tablet Take 88 mcg by mouth daily before breakfast. 01/12/15  Yes [provider]  pravastatin (PRAVACHOL) 20 MG tablet Take 20 mg by mouth at bedtime.    Yes [provider]   DG Chest 1 View  Result Date: 07/09/2021 CLINICAL DATA:  Fall.  Left shoulder pain.  Hypotension. EXAM: CHEST  1 VIEW COMPARISON:  07/09/2020.  Abdominopelvic CT 05/21/2021 FINDINGS: Midline trachea. Borderline  cardiomegaly. Atherosclerosis in the transverse aorta. Large hiatal hernia, progressive compared to the prior plain film. No right-sided pleural effusion. The hernia degrades evaluation of the inferior left hemithorax. No pneumothorax. Given above limitations, lungs are clear. Deformities of superolateral right ribs are at least partially remote, present on the prior. IMPRESSION: No acute cardiopulmonary disease. Progression of a large hiatal hernia, limiting evaluation of the inferior left hemithorax. Aortic Atherosclerosis (ICD10-I70.0). Electronically Signed   By: Abigail Miyamoto M.D.   On: 07/09/2021 14:20   DG Shoulder Left  Result Date: 07/09/2021 CLINICAL DATA:  Fall, left shoulder pain EXAM: LEFT SHOULDER - 2+ VIEW COMPARISON:  None Available. FINDINGS: There is no acute fracture or dislocation. Glenohumeral and acromioclavicular alignment is maintained though the humeral head is high-riding suggesting rotator cuff pathology. There is moderate narrowing of the glenohumeral joint with associated subchondral sclerosis. The soft tissues are unremarkable. IMPRESSION: No acute fracture or dislocation. Electronically Signed   By: Valetta Mole M.D.   On: 07/09/2021 14:21   DG Hip Unilat With Pelvis 2-3 Views Left  Result Date: 07/09/2021 CLINICAL DATA:  Fall, pain, initial encounter. EXAM: DG HIP (WITH OR WITHOUT PELVIS) 2-3V LEFT COMPARISON:  None Available. FINDINGS: Left femoral neck fracture with superior displacement of the distal fracture fragment. Superomedial joint space narrowing in the left hip with mild sclerosis along the acetabulum. IMPRESSION: Left femoral neck fracture. Electronically Signed   By: Lorin Picket M.D.   On: 07/09/2021 14:17    Positive ROS: All other systems have been reviewed and were otherwise negative with the exception of those mentioned in the HPI and as above.  Physical Exam: General: Alert, no acute distress Cardiovascular: No pedal edema Respiratory: No cyanosis,  no use of accessory musculature GI: No organomegaly, abdomen is soft and non-tender Skin: No lesions in the area of chief complaint Neurologic: Sensation intact distally Psychiatric: Patient is competent for consent with normal mood and affect Lymphatic: No axillary or cervical lymphadenopathy  MUSCULOSKELETAL:  Left hip: Diffuse tenderness to palpation, tolerates gentle motion of knee and ankle, left lower extremities grossly neurovascular intact   Assessment: 88 year old female admitted with a displaced left femoral neck fracture.  Plan: -Appreciate hospitalist team support -I long discussion with the patient regarding risk and benefits of operative and nonoperative management.  Given her displaced fracture, recommendation was made for left hip hemiarthroplasty.  Please keep patient NPO.  We will plan for surgery later today.    Renee Harder, MD    07/10/2021 7:58 AM

## 2021-07-10 NOTE — Progress Notes (Signed)
PROGRESS NOTE  Theresa Snyder EHO:[id number] DOB: 1929-03-02 DOA: 07/09/2021 PCP: [facility]  Brief History    Theresa Snyder is a 88 y.o. female with medical history significant of chronic kidney disease stage III/IV, lymphedema, hypertension, hypothyroidism, hyperlipidemia, history of seizure no longer on Keppra, history of large hiatal hernia, ambulatory dysfunction.   This patient presents from home after having a mechanical fall.  She was trying to get of bed and lost her balance.  She fell on her left side and presented to the emergency department with left hip pain and left shoulder pain.  Left shoulder plain film imaging is negative.  Imaging showed a left femoral neck fracture.  ED provider discussed with Dr. Sharlet Salina.  The plan is for left hemiarthroplasty.  Will likely be done tomorrow.  Denies any chest pain.  Denies any shortness of breath.  Denies any urinary complaints.  No cough. No fevers.  Asking for a GI soft diet.  Daughter present at bedside.  The patient lives alone at home.  Pain is well controlled with medications given in the ER.   The patient was admitted to a telemetry bed. Her potassium was 6 this am. It was treated with Lokelma 10 mg once.  Orthopedic surgery was consulted. The patient was taken for left hemiarthroplasty today.  Consultants  Orthopedic surgery  Procedures  Left hemiarthroplasty  Antibiotics   Anti-infectives (From admission, onward)    Start     Dose/Rate Route Frequency Ordered Stop   07/10/21 1528  ceFAZolin (ANCEF) 2-4 GM/100ML-% IVPB       Note to Pharmacy: Maryagnes Amos B: cabinet override      07/10/21 1528 07/10/21 1632   07/10/21 0900  [MAR Hold]  ceFAZolin (ANCEF) IVPB 2g/100 mL premix        (MAR Hold since Thu 07/10/2021 at 1523.Hold Reason: Transfer to a Procedural area)   2 g 200 mL/hr over 30 Minutes Intravenous To Surgery 07/10/21 0803 07/10/21 1615        Subjective  The patient is resting  comfortably. No new complaints.  Objective   Vitals:  Vitals:   07/10/21 1152 07/10/21 1525  BP: (!) 153/48 (!) 146/66  Pulse: (!) 109   Resp: 20 20  Temp: 97.8 F (36.6 C) 98.2 F (36.8 C)  SpO2: 98% 98%    Exam:  Constitutional:  Appears calm and comfortable Eyes:  pupils and irises appear normal Normal lids and conjunctivae ENMT:  grossly normal hearing  Lips appear normal external ears, nose appear normal Oropharynx: mucosa, tongue,posterior pharynx appear normal Neck:  neck appears normal, no masses, normal ROM, supple no thyromegaly Respiratory:  CTA bilaterally, no w/r/r.  Respiratory effort normal. No retractions or accessory muscle use Cardiovascular:  RRR, no m/r/g No LE extremity edema   Normal pedal pulses Abdomen:  Abdomen appears normal; no tenderness or masses No hernias No HSM Musculoskeletal:  Digits/nails BUE: no clubbing, cyanosis, petechiae, infection exam of joints, bones, muscles of at least one of following: head/neck, RUE, LUE, RLE, LLE   strength and tone normal, no atrophy, no abnormal movements No tenderness, masses Normal ROM, no contractures  gait and station Skin:  No rashes, lesions, ulcers palpation of skin: no induration or nodules Neurologic:  CN 2-12 intact Sensation all 4 extremities intact  I have personally reviewed the following:   [place]  CXR  Cardiology Data  EKG  Other Data    Scheduled Meds:  [MAR Hold] amLODipine  10 mg Oral Q breakfast   [MAR Hold] benazepril  20 mg Oral Daily   [MAR Hold] feeding supplement  237 mL Oral TID BM   [MAR Hold] gabapentin  300 mg Oral Daily   [MAR Hold] hydrochlorothiazide  25 mg Oral Daily   [MAR Hold] levothyroxine  88 mcg Oral QAC breakfast   [MAR Hold] multivitamin with minerals  1 tablet Oral Daily   [MAR Hold] mupirocin ointment  1 application. Nasal BID   [MAR Hold] pravastatin  20 mg Oral QHS    sodium chloride flush       Continuous Infusions:  Problem  Lymphedema of Leg  Hip Fracture (Hcc)  Stage 3b Chronic Kidney Disease (Hcc)  Bp (High Blood Pressure)  Adult Hypothyroidism  Hld (Hyperlipidemia)    LOS: 1 day    A & P  Displaced fracture of the left hip: Orthopedic surgery was consulted and will take the patient for left hemiarthroplasty today.  Hyperkalemia: Due to CKD 3b. Lokelma given pre-operatively. Monitor on telemetry.  Stage 3b CKD:  Baseline creatinine is 1.4 - 1.8. She presented with creatinine of 2.01.  Hypothyroidism: Continue synthroid 88 mcg daily as at home.  Hypertension: The patient is normotensive although she has not had her hydrochlorothiazide, amlodipine, or benazepril since yesterday. Restart postoperatively as warranted. Will hold benazepril given patient's elevated creatinine and hyperkalemia.  Hyperlipidemia: Continue statin as at home.  I have seen and examined this patient myself. I have spent 34 minutes in her evaluation and car.e  DVT prophylaxis: heparin in the am if allowed by orthopedic surgery Code Status: Full Code Family Communication: None available Disposition Plan: tbd    Theresa Rosman, DO [facility] Direct contact: see [email or web address]  7PM-7AM contact night coverage as above 07/10/2021, 5:35 PM  LOS: 1 day

## 2021-07-10 NOTE — Transfer of Care (Signed)
Immediate Anesthesia Transfer of Care Note  Patient: Theresa Snyder  Procedure(s) Performed: ARTHROPLASTY BIPOLAR HIP (HEMIARTHROPLASTY) (Left: Hip)  Patient Location: PACU  Anesthesia Type:General  Level of Consciousness: awake  Airway & Oxygen Therapy: Patient Spontanous Breathing and Patient connected to face mask oxygen  Post-op Assessment: Report given to RN and Post -op Vital signs reviewed and stable  Post vital signs: Reviewed and stable  Last Vitals:  Vitals Value Taken Time  BP 101/61 07/10/21 1838  Temp    Pulse 78 07/10/21 1838  Resp 17 07/10/21 1838  SpO2 99 % 07/10/21 1838  Vitals shown include unvalidated device data.  Last Pain:  Vitals:   07/10/21 1525  TempSrc: Oral  PainSc:          Complications: No notable events documented.

## 2021-07-10 NOTE — Anesthesia Postprocedure Evaluation (Signed)
Anesthesia Post Note  Patient: Theresa Snyder  Procedure(s) Performed: ARTHROPLASTY BIPOLAR HIP (HEMIARTHROPLASTY) (Left: Hip)  Patient location during evaluation: PACU Anesthesia Type: General Level of consciousness: awake and alert Pain management: pain level controlled Vital Signs Assessment: post-procedure vital signs reviewed and stable Respiratory status: spontaneous breathing, nonlabored ventilation, respiratory function stable and patient connected to nasal cannula oxygen Cardiovascular status: blood pressure returned to baseline and stable Postop Assessment: no apparent nausea or vomiting Anesthetic complications: no   No notable events documented.   Last Vitals:  Vitals:   07/10/21 1931 07/10/21 2009  BP: (!) 110/55 (!) 102/48  Pulse: 76 72  Resp: 13 16  Temp: 36.6 C 36.8 C  SpO2: 99% 98%    Last Pain:  Vitals:   07/10/21 1931  TempSrc:   PainSc: 0-No pain                 Arita Miss

## 2021-07-10 NOTE — Anesthesia Procedure Notes (Signed)
Procedure Name: Intubation Date/Time: 07/10/2021 4:26 PM Performed by: Cammie Sickle, CRNA Pre-anesthesia Checklist: Patient identified, Patient being monitored, Timeout performed, Emergency Drugs available and Suction available Patient Re-evaluated:Patient Re-evaluated prior to induction Oxygen Delivery Method: Circle system utilized Preoxygenation: Pre-oxygenation with 100% oxygen Induction Type: IV induction Ventilation: Mask ventilation without difficulty Laryngoscope Size: 3 and McGraph Grade View: Grade I Tube type: Oral Tube size: 6.5 mm Number of attempts: 1 Airway Equipment and Method: Stylet Placement Confirmation: ETT inserted through vocal cords under direct vision, positive ETCO2 and breath sounds checked- equal and bilateral Secured at: 19 cm Tube secured with: Tape Dental Injury: Teeth and Oropharynx as per pre-operative assessment

## 2021-07-10 NOTE — Progress Notes (Signed)
   07/10/21 1020  Clinical Encounter Type  Visited With Patient  Visit Type Initial;Spiritual support;Social support  Spiritual Encounters  Spiritual Needs Prayer   Chaplain [place] visited with pt prior to her scheduled procedure this afternoon. Theresa Snyder expressed her faith as a source of strength [employer] and [employer], visited by clergy weekly). At pt's request Chaplain B offered prayer for her healing and relief from pain.  This morning chaplain's office received a call from Theresa Snyder (pt's daughter) that prompted this visit. Theresa Snyder asked for a call back following visit, and this chaplain contacted in a HIPAA compliant manner to assess her needs as a family member.

## 2021-07-10 NOTE — Progress Notes (Signed)
PT Cancellation Note  Patient Details Name: Theresa Snyder MRN: [id number] DOB: [birth year]   Cancelled Treatment:    Reason Eval/Treat Not Completed: Other (comment) PT orders received, chart reviewed. Per ortho consultation, pt planning for L hip hemiarthroplasty later today. Will hold PT evaluation & wait until new orders received following sx. Thank you.  Lavone Nian, PT, DPT 07/10/21, 8:35 AM   Waunita Schooner 07/10/2021, 8:34 AM

## 2021-07-10 NOTE — Op Note (Signed)
07/10/2021  6:46 PM  PATIENT:  Theresa Snyder   MRN: [id number]  PRE-OPERATIVE DIAGNOSIS: Left femoral neck fracture  POST-OPERATIVE DIAGNOSIS:  Left femoral neck fracture  PROCEDURE:  Procedure(s): ARTHROPLASTY BIPOLAR HIP (HEMIARTHROPLASTY)  PREOPERATIVE INDICATIONS:    Theresa Snyder is an 88 y.o. female who was admitted with a diagnosis of fracture.  I have recommended surgical fixation with hemiarthroplasty for this injury. I have explained the surgery and the postoperative course to the patient and their family who have agreed with surgical management of this fracture.    The risks benefits and alternatives were discussed with the patient and their family including but not limited to the risks of  infection requiring removal of the prosthesis, bleeding requiring blood transfusion, nerve injury especially to the sciatic nerve leading to foot drop or lower extremity numbness, periprosthetic fracture, dislocation leg length discrepancy, change in lower extremity rotation persistent hip pain, loosening or failure of the components and the need for revision surgery. Medical risks include but are not limited to DVT and pulmonary embolism, myocardial infarction, stroke, pneumonia, respiratory failure and death.  OPERATIVE REPORT     SURGEON:  Renee Harder    ANESTHESIA: General    COMPLICATIONS:  None.   SPECIMEN: Femoral head to pathology    COMPONENTS: De[place] size 5 cemented stem, 44 mm head +0 mm neck    PROCEDURE IN DETAIL:   The patient was met in the holding area and  identified.  The appropriate hip was identified and marked at the operative site after verbally confirming with the patient that this was the correct site of surgery.  The patient was then transported to the OR  and  underwent general LMA Denzal Meir the nail when it started this is like a 30-minute hip fracture case okay there is no called the movement there is no other anesthesia.  The patient was then  placed in the lateral decubitus position with the operative side up and secured on the operating room table with a pegboard and all bony prominences were adequately padded. This included an axillary roll and additional padding around the nonoperative leg to prevent compression to the common peroneal nerve.    The operative lower extremity was prepped and draped in a sterile fashion.  A time out was performed prior to incision to verify patient's name, date of birth, medical record number, correct site of surgery correct procedure to be performed. The timeout was also used to verify the patient received antibiotics now appropriate instruments, implants and radiographic studies were available in the room. Once all in attendance were in agreement case began.    A posterolateral approach was utilized via sharp dissection  carried down to the subcutaneous tissue.  Bleeding vessels were coagulated using electrocautery.  The fascia lata was identified and incised along the length of the skin incision.  The gluteus maximus muscle was then split in line with its fibers. Self-retaining retractors were  inserted.  With the hip internally rotated, the short external rotators  were identified and removed from the posterior attachment from the greater trochanter. The piriformis was tagged for later repair. The capsule was identified and a T-shaped capsulotomy was performed. The capsule was tagged with #2 Tycron for later repair.  The femoral neck fracture was exposed, and the femoral head was removed using a corkscrew device. This was measured to be 44 mm in diameter. The attention was then turned to proximal femur preparation.  An oscillating saw was used to  perform a proximal femoral osteotomy 1 fingerbreadth above the lesser trochanter. The trial 44 mm femoral head was placed into the acetabulum and had an excellent suction fit. The attention was then turned back to femoral preparation.    A femoral skid and Cobra  retractor were placed under the femoral neck to allow for adequate visualization. A box osteotome was used to make the initial entry into the proximal femur. A single hand reamer was used to prepare the femoral canal. A T-shaped femoral canal sounder was then used to ensure no penetration femoral cortex had occurred during reaming. The proximal femur was then sequentially broached by hand. A size 5 femoral trial broach was found to have best medial to lateral canal fit. Once adequate mediolateral canal fill was achieved the trial femoral broach, neck, and head was assembled and the hip was reduced. It was found to have excellent stability, equivalent leg lengths with functional range of motion. The trial components were then removed.  I copiously irrigated the femoral canal and then impacted the real femoral prosthesis into place into the appropriate version, slightly anteverted to the normal anatomy, and I cemented the real stem in standard fashion. The hip was then reduced and taken through functional range of motion and found to have excellent stability. Leg lengths were restored. The hip joint was copiously irrigated.   A soft tissue repair of the capsule and external rotators was performed using #2 Tycron Excellent posterior capsular repair was achieved. The fascia lata was then closed with interrupted 0 Vicryl suture and 0 vicryl quill. The subcutaneous tissues were closed with 2-0 Vicryl and the skin approximated with staples.   The patient was then placed supine on the operative table. Leg lengths were checked clinically and found to be equivalent. An abduction pillow was placed between the lower extremities. The patient was then transferred to a hospital bed and brought to the PACU in stable condition.   Postop plan 1.  Patient will be weightbearing as tolerated with posterior hip precautions 2.  Recommend DVT prophylaxis with Lovenox x 14 days followed by aspirin 3.  Keep Aquacel dressing in  place until follow-up unless saturated 4.  Follow-up in clinic in 2 weeks with x-rays and for staple removal  Renee Harder, MD Orthopedic Surgeon

## 2021-07-10 NOTE — Progress Notes (Signed)
OT Cancellation Note  Patient Details Name: Theresa Snyder MRN: [id number] DOB: August 06, 1929   Cancelled Treatment:    Reason Eval/Treat Not Completed: Other (comment);Patient not medically ready. Consult received, chart reviewed. Per ortho consultation, pt planning for L hip hemiarthroplasty later today. Will hold OT evaluation & wait until new orders received following sx.   Ardeth Perfect., MPH, MS, OTR/L ascom [phone] 07/10/21, 8:49 AM

## 2021-07-10 NOTE — Progress Notes (Signed)
Initial Nutrition Assessment  DOCUMENTATION CODES:   Not applicable  INTERVENTION:   -Once diet is advanced, add:   -Ensure Enlive po BID, each supplement provides 350 kcal and 20 grams of protein -MVI with minerals daily  NUTRITION DIAGNOSIS:   Increased nutrient needs related to post-op healing as evidenced by estimated needs.  GOAL:   Patient will meet greater than or equal to 90% of their needs  MONITOR:   PO intake, Supplement acceptance, Diet advancement  REASON FOR ASSESSMENT:   Malnutrition Screening Tool    ASSESSMENT:   Pt with PMH of hypercholesterolemia, spinal stenosis, lymphadema, hypothyroidism, HTN, arthritis, CKD, and seizures admitted with lt hip fracture s/p mechanical fall.  Pt admitted with lt femoral neck fracture.   Reviewed I/O's: +240 ml x 24 hours   Pt NPO for surgery today.   Spoke with pt at bedside, who reports feeling a little better today. She reports poor appetite over the past months. She tries to consume 2 meals per day, which usually consists of nuts or nut butter, fruits, and vegetables. Per pt, she no longer eats red meat as it is more difficult to chew and no longer finds it appealing. Pt eats chicken or seafood about 2 times per week.   Per pt, her UBW is around 120# and estimates she has lost 10# over the past month. Reviewed wt hx; pt has experienced a 8.7% wt loss over the past year, which is not significant for time frame.   Discussed importance of good meal and supplement intake to promote healing. Pt amenable to Ensure supplements, stating she was consuming daily for about a week PTA.   Medications reviewed.   Lab Results  Component Value Date   HGBA1C 6.7 (H) 10/10/2012   PTA DM medications are none.   Labs reviewed: K: 5.5, CBGS: 103-104 (inpatient orders for glycemic control are none).    NUTRITION - FOCUSED PHYSICAL EXAM:  Flowsheet Row Most Recent Value  Orbital Region No depletion  Upper Arm Region Mild  depletion  Thoracic and Lumbar Region No depletion  Buccal Region No depletion  Temple Region No depletion  Clavicle Bone Region Mild depletion  Clavicle and Acromion Bone Region Mild depletion  Scapular Bone Region Mild depletion  Dorsal Hand Mild depletion  Patellar Region Mild depletion  Anterior Thigh Region Mild depletion  Posterior Calf Region Mild depletion  Edema (RD Assessment) Mild  Hair Reviewed  Eyes Reviewed  Mouth Reviewed  Skin Reviewed  Nails Reviewed       Diet Order:   Diet Order             Diet NPO time specified  Diet effective midnight                   EDUCATION NEEDS:   Education needs have been addressed  Skin:  Skin Assessment: Reviewed RN Assessment  Last BM:  07/10/21  Height:   Ht Readings from Last 1 Encounters:  07/09/21 5' (1.524 m)    Weight:   Wt Readings from Last 1 Encounters:  07/09/21 58 kg    Ideal Body Weight:  45.5 kg  BMI:  Body mass index is 24.97 kg/m.  Estimated Nutritional Needs:   Kcal:  1550-1750  Protein:  75-90 grams  Fluid:  > 1.5 L    Loistine Chance, RD, LDN, [place] Registered Dietitian II Certified Diabetes Care and Education Specialist Please refer to [facility] for RD and/or RD on-call/weekend/after hours pager

## 2021-07-11 ENCOUNTER — Encounter: Payer: Self-pay | Admitting: Orthopaedic Surgery

## 2021-07-11 DIAGNOSIS — S72002A Fracture of unspecified part of neck of left femur, initial encounter for closed fracture: Secondary | ICD-10-CM | POA: Diagnosis not present

## 2021-07-11 DIAGNOSIS — I150 Renovascular hypertension: Secondary | ICD-10-CM | POA: Diagnosis not present

## 2021-07-11 DIAGNOSIS — E782 Mixed hyperlipidemia: Secondary | ICD-10-CM | POA: Diagnosis not present

## 2021-07-11 DIAGNOSIS — E039 Hypothyroidism, unspecified: Secondary | ICD-10-CM | POA: Diagnosis not present

## 2021-07-11 LAB — COMPREHENSIVE METABOLIC PANEL
ALT: 17 U/L (ref 0–44)
AST: 32 U/L (ref 15–41)
Albumin: 3 g/dL — ABNORMAL LOW (ref 3.5–5.0)
Alkaline Phosphatase: 41 U/L (ref 38–126)
Anion gap: 5 (ref 5–15)
BUN: 52 mg/dL — ABNORMAL HIGH (ref 8–23)
CO2: 25 mmol/L (ref 22–32)
Calcium: 8.6 mg/dL — ABNORMAL LOW (ref 8.9–10.3)
Chloride: 106 mmol/L (ref 98–111)
Creatinine, Ser: 1.78 mg/dL — ABNORMAL HIGH (ref 0.44–1.00)
GFR, Estimated: 27 mL/min — ABNORMAL LOW (ref >60)
Glucose, Bld: 192 mg/dL — ABNORMAL HIGH (ref 70–99)
Potassium: 5.4 mmol/L — ABNORMAL HIGH (ref 3.5–5.1)
Sodium: 136 mmol/L (ref 135–145)
Total Bilirubin: 0.5 mg/dL (ref 0.3–1.2)
Total Protein: 5.5 g/dL — ABNORMAL LOW (ref 6.5–8.1)

## 2021-07-11 LAB — CBC WITH DIFFERENTIAL/PLATELET
Abs Immature Granulocytes: 0.05 10*3/uL (ref 0.00–0.07)
Basophils Absolute: 0 10*3/uL (ref 0.0–0.1)
Basophils Relative: 0 %
Eosinophils Absolute: 0 10*3/uL (ref 0.0–0.5)
Eosinophils Relative: 0 %
HCT: 26.2 % — ABNORMAL LOW (ref 36.0–46.0)
Hemoglobin: 8.8 g/dL — ABNORMAL LOW (ref 12.0–15.0)
Immature Granulocytes: 1 %
Lymphocytes Relative: 8 %
Lymphs Abs: 0.7 10*3/uL (ref 0.7–4.0)
MCH: 33.8 pg (ref 26.0–34.0)
MCHC: 33.6 g/dL (ref 30.0–36.0)
MCV: 100.8 fL — ABNORMAL HIGH (ref 80.0–100.0)
Monocytes Absolute: 0.3 10*3/uL (ref 0.1–1.0)
Monocytes Relative: 3 %
Neutro Abs: 7.9 10*3/uL — ABNORMAL HIGH (ref 1.7–7.7)
Neutrophils Relative %: 88 %
Platelets: 152 10*3/uL (ref 150–400)
RBC: 2.6 MIL/uL — ABNORMAL LOW (ref 3.87–5.11)
RDW: 12.1 % (ref 11.5–15.5)
WBC: 9 10*3/uL (ref 4.0–10.5)
nRBC: 0 % (ref 0.0–0.2)

## 2021-07-11 LAB — BASIC METABOLIC PANEL
Anion gap: 7 (ref 5–15)
BUN: 53 mg/dL — ABNORMAL HIGH (ref 8–23)
CO2: 25 mmol/L (ref 22–32)
Calcium: 8.4 mg/dL — ABNORMAL LOW (ref 8.9–10.3)
Chloride: 100 mmol/L (ref 98–111)
Creatinine, Ser: 1.82 mg/dL — ABNORMAL HIGH (ref 0.44–1.00)
GFR, Estimated: 26 mL/min — ABNORMAL LOW (ref >60)
Glucose, Bld: 153 mg/dL — ABNORMAL HIGH (ref 70–99)
Potassium: 4.6 mmol/L (ref 3.5–5.1)
Sodium: 132 mmol/L — ABNORMAL LOW (ref 135–145)

## 2021-07-11 LAB — GLUCOSE, CAPILLARY: Glucose-Capillary: 171 mg/dL — ABNORMAL HIGH (ref 70–99)

## 2021-07-11 MED ORDER — SODIUM ZIRCONIUM CYCLOSILICATE 10 G PO PACK
10.0000 g | PACK | Freq: Once | ORAL | Status: AC
  Administered 2021-07-11: 10 g via ORAL
  Filled 2021-07-11: qty 1

## 2021-07-11 NOTE — Evaluation (Signed)
Physical Therapy Evaluation Patient Details Name: Theresa Snyder MRN: [id number] DOB: Mar 06, 1929 Today's Date: 07/11/2021  History of Present Illness  The pt is a 88 yo female s/p L hemiarthroplasty due to a hip fracture. PMH of HTN, aspiration PNA 2/2 hiatal hernia and opiod use in April, seizues, CKD III. Pt also noted to have elevated K+, cleared by MD for therapy after lokelma to address this.   Clinical Impression  Pt alert, and oriented x4 agreeable to PT, seated in recliner at start/end of session, reported no pain at rest. The patient did report 8/10 L hip pain at end of session and RN notified. The patient reported at baseline she lives alone in a mobile home, with her son living right next door, modI/I for ADLs. She does have an aide 1-2x a week to assist with bathing.   The patient was able to move all extremities against gravity. Sit <> Stand with minA and RW, tactile/verbal cues for proper hand placement. She was able ambulate ~42f with RW and minA, decreased weight bearing, stance time, and PT assist to off weight LLE. Very effortful for pt, SOB when returned to recliner. Pt HR 110s, and spO2 85% on room air, improved to 91% with time and PLB, RN informed.  Overall the patient demonstrated deficits (see "PT Problem List") that impede the patient's functional abilities, safety, and mobility and would benefit from skilled PT intervention. SNF recommendation at discharge due to current level of assistance needed and change from PLOF.         Recommendations for follow up therapy are one component of a multi-disciplinary discharge planning process, led by the attending physician.  Recommendations may be updated based on patient status, additional functional criteria and insurance authorization.  Follow Up Recommendations Skilled nursing-short term rehab (<3 hours/day)    Assistance Recommended at Discharge    Patient can return home with the following  Two people to help with walking  and/or transfers;Two people to help with bathing/dressing/bathroom;Help with stairs or ramp for entrance;Assist for transportation;Assistance with cooking/housework;Direct supervision/assist for financial management    Equipment Recommendations Other (comment) (TBD at next venue of care)  Recommendations for Other Services       Functional Status Assessment Patient has had a recent decline in their functional status and demonstrates the ability to make significant improvements in function in a reasonable and predictable amount of time.     Precautions / Restrictions Precautions Precautions: Fall;Posterior Hip Precaution Booklet Issued: No Restrictions Weight Bearing Restrictions: Yes LLE Weight Bearing: Weight bearing as tolerated      Mobility  Bed Mobility               General bed mobility comments: pt up in recliner at start/end of session    Transfers Overall transfer level: Needs assistance Equipment used: Rolling walker (2 wheels) Transfers: Sit to/from Stand Sit to Stand: Min assist                Ambulation/Gait Ambulation/Gait assistance: Min assist Gait Distance (Feet): 4 Feet Assistive device: Rolling walker (2 wheels) Gait Pattern/deviations: Decreased stance time - left, Decreased stride length, Step-to pattern       General Gait Details: minA to off weight LLE. Cued for tehcnique for step by step, repetition needed. Pt noted for fatigue and elevated RR  Stairs            Wheelchair Mobility    Modified Rankin (Stroke Patients Only)       Balance  Overall balance assessment: Needs assistance Sitting-balance support: Feet supported Sitting balance-Leahy Scale: Fair     Standing balance support: Reliant on assistive device for balance Standing balance-Leahy Scale: Poor                               Pertinent Vitals/Pain Pain Assessment Pain Assessment: 0-10 Pain Score: 8  Pain Location: L hip Pain Descriptors /  Indicators: Aching, Guarding, Grimacing, Moaning Pain Intervention(s): Limited activity within patient's tolerance, Premedicated before session, Repositioned, Monitored during session    Home Living Family/patient expects to be discharged to:: Private residence Living Arrangements: Alone Available Help at Discharge: Family;Available PRN/intermittently Type of Home: Mobile home Home Access: Stairs to enter Entrance Stairs-Rails: Can reach both Entrance Stairs-Number of Steps: 3   Home Layout: One level Home Equipment: Rollator (4 wheels)      Prior Function Prior Level of Function : Independent/Modified Independent             Mobility Comments: pt has 3 wheeled walker ADLs Comments: pt able to perform ADLs independently but only sink baths while family is not present. showers once a week when her daughter is in the home     Hand Dominance   Dominant Hand: Right    Extremity/Trunk Assessment   Upper Extremity Assessment Upper Extremity Assessment: Generalized weakness    Lower Extremity Assessment Lower Extremity Assessment: Generalized weakness    Cervical / Trunk Assessment Cervical / Trunk Assessment: Kyphotic  Communication   Communication: No difficulties  Cognition Arousal/Alertness: Awake/alert Behavior During Therapy: WFL for tasks assessed/performed Overall Cognitive Status: Within Functional Limits for tasks assessed                                          General Comments      Exercises     Assessment/Plan    PT Assessment Patient needs continued PT services  PT Problem List Decreased strength;Decreased mobility;Decreased range of motion;Decreased activity tolerance;Decreased balance;Pain;Decreased knowledge of use of DME;Decreased knowledge of precautions       PT Treatment Interventions DME instruction;Therapeutic exercise;Gait training;Balance training;Stair training;Neuromuscular re-education;Functional mobility  training;Therapeutic activities;Patient/family education    PT Goals (Current goals can be found in the Care Plan section)  Acute Rehab PT Goals Patient Stated Goal: to go home PT Goal Formulation: With patient Time For Goal Achievement: 07/25/21 Potential to Achieve Goals: Fair    Frequency 7X/week     Co-evaluation               AM-PAC PT "6 Clicks" Mobility  Outcome Measure Help needed turning from your back to your side while in a flat bed without using bedrails?: A Lot Help needed moving from lying on your back to sitting on the side of a flat bed without using bedrails?: A Lot Help needed moving to and from a bed to a chair (including a wheelchair)?: A Little Help needed standing up from a chair using your arms (e.g., wheelchair or bedside chair)?: A Little Help needed to walk in hospital room?: A Lot Help needed climbing 3-5 steps with a railing? : Total 6 Click Score: 13    End of Session Equipment Utilized During Treatment: Gait belt Activity Tolerance: Patient limited by fatigue;Patient limited by pain Patient left: in chair;with call bell/phone within reach Nurse Communication: Mobility status PT Visit Diagnosis:  Other abnormalities of gait and mobility (R26.89);Difficulty in walking, not elsewhere classified (R26.2);Muscle weakness (generalized) (M62.81)    Time: 1364-3837 PT Time Calculation (min) (ACUTE ONLY): 23 min   Charges:   PT Evaluation $PT Eval Low Complexity: 1 Low PT Treatments $Therapeutic Activity: 8-22 mins        Lieutenant Diego PT, DPT 12:43 PM,07/11/21

## 2021-07-11 NOTE — Progress Notes (Signed)
Subjective:  Patient reports pain as moderate. Sitting in bedside chair.  Objective:   VITALS:   Vitals:   07/10/21 2214 07/11/21 0400 07/11/21 0801 07/11/21 1214  BP: (!) 98/55 (!) 131/95 125/65 (!) 110/55  Pulse: 71 (!) 101 80 83  Resp: '16 16 16 18  '$ Temp: 97.6 F (36.4 C) 98.1 F (36.7 C) 98 F (36.7 C) 98.2 F (36.8 C)  TempSrc: Oral Oral    SpO2: 100% 97% 99% 92%  Weight:      Height:        PHYSICAL EXAM: General: alert, comfortable Left lower extremity: dressing c/d/I, LLE is grossly motor and sensory intact  LABS  Results for orders placed or performed during the hospital encounter of 07/09/21 (from the past 24 hour(s))  Comprehensive metabolic panel     Status: Abnormal   Collection Time: 07/11/21  3:51 AM  Result Value Ref Range   Sodium 136 135 - 145 mmol/L   Potassium 5.4 (H) 3.5 - 5.1 mmol/L   Chloride 106 98 - 111 mmol/L   CO2 25 22 - 32 mmol/L   Glucose, Bld 192 (H) 70 - 99 mg/dL   BUN 52 (H) 8 - 23 mg/dL   Creatinine, Ser 1.78 (H) 0.44 - 1.00 mg/dL   Calcium 8.6 (L) 8.9 - 10.3 mg/dL   Total Protein 5.5 (L) 6.5 - 8.1 g/dL   Albumin 3.0 (L) 3.5 - 5.0 g/dL   AST 32 15 - 41 U/L   ALT 17 0 - 44 U/L   Alkaline Phosphatase 41 38 - 126 U/L   Total Bilirubin 0.5 0.3 - 1.2 mg/dL   GFR, Estimated 27 (L) >60 mL/min   Anion gap 5 5 - 15  CBC with Differential/Platelet     Status: Abnormal   Collection Time: 07/11/21  3:51 AM  Result Value Ref Range   WBC 9.0 4.0 - 10.5 K/uL   RBC 2.60 (L) 3.87 - 5.11 MIL/uL   Hemoglobin 8.8 (L) 12.0 - 15.0 g/dL   HCT 26.2 (L) 36.0 - 46.0 %   MCV 100.8 (H) 80.0 - 100.0 fL   MCH 33.8 26.0 - 34.0 pg   MCHC 33.6 30.0 - 36.0 g/dL   RDW 12.1 11.5 - 15.5 %   Platelets 152 150 - 400 K/uL   nRBC 0.0 0.0 - 0.2 %   Neutrophils Relative % 88 %   Neutro Abs 7.9 (H) 1.7 - 7.7 K/uL   Lymphocytes Relative 8 %   Lymphs Abs 0.7 0.7 - 4.0 K/uL   Monocytes Relative 3 %   Monocytes Absolute 0.3 0.1 - 1.0 K/uL   Eosinophils Relative  0 %   Eosinophils Absolute 0.0 0.0 - 0.5 K/uL   Basophils Relative 0 %   Basophils Absolute 0.0 0.0 - 0.1 K/uL   Immature Granulocytes 1 %   Abs Immature Granulocytes 0.05 0.00 - 0.07 K/uL  Glucose, capillary     Status: Abnormal   Collection Time: 07/11/21  8:27 AM  Result Value Ref Range   Glucose-Capillary 171 (H) 70 - 99 mg/dL   Comment 1 Notify RN   Basic metabolic panel     Status: Abnormal   Collection Time: 07/11/21  3:12 PM  Result Value Ref Range   Sodium 132 (L) 135 - 145 mmol/L   Potassium 4.6 3.5 - 5.1 mmol/L   Chloride 100 98 - 111 mmol/L   CO2 25 22 - 32 mmol/L   Glucose, Bld 153 (H) 70 - 99  mg/dL   BUN 53 (H) 8 - 23 mg/dL   Creatinine, Ser 1.82 (H) 0.44 - 1.00 mg/dL   Calcium 8.4 (L) 8.9 - 10.3 mg/dL   GFR, Estimated 26 (L) >60 mL/min   Anion gap 7 5 - 15    Pelvis Portable  Result Date: 07/10/2021 CLINICAL DATA:  Postop. EXAM: PORTABLE PELVIS 1-2 VIEWS COMPARISON:  Preoperative radiograph yesterday. FINDINGS: Left hip arthroplasty in expected alignment. No periprosthetic lucency. Recent postsurgical change includes air and edema in the joint space and soft tissues. Lateral skin staples in place. IMPRESSION: Left hip arthroplasty without immediate postoperative complication. Electronically Signed   By: Keith Rake M.D.   On: 07/10/2021 19:17    Assessment/Plan: 1 Day Post-Op  S/p Left hip hemiarthroplasty - Appreciate hospitalist team support - PT/OT: WBAT LLE, posterior hip precautions - DVT ppx: Lovenox x 14 days - Dispo per PT/case manager: current recommendation SNF - Follow-up in clinic in 2 weeks for xrays and staple removal   Renee Harder , MD 07/11/2021, 4:38 PM

## 2021-07-11 NOTE — Plan of Care (Signed)

## 2021-07-11 NOTE — Care Management Important Message (Signed)
Important Message  Patient Details  Name: Theresa Snyder MRN: [id number] Date of Birth: 24-May-1929   Medicare Important Message Given:  N/A - LOS <3 / Initial given by admissions     Juliann Pulse A Ringo Sherod 07/11/2021, 8:57 AM

## 2021-07-11 NOTE — Discharge Instructions (Signed)
Orthopedic discharge instructions: Patient has had a left hip hemiarthroplasty and can remain full weightbearing on the right lower extremity. She needs to observe posterior hip precautions. Patient should continue to elevate the left lower extremity and may apply ice to the surgical site prn.  The patient should continue using incentive spirometry every hour while awake. Patient should also continue TED stockings until their orthopedic follow-up. Patient will remain on lovenox for 2 weeks for DVT prophylaxis followed by 2 weeks of enteric-coated aspirin 325 mg by mouth daily.  Patient should continue physical and occupational therapy and the SNF to work on hip range of motion, lower extremity strengthening and gait training. She should use a walker for assistance with ambulation at all times.  Patient should keep the Aquacell dressing in place unless saturated. Patient will follow up with Dr. Renee Harder at [facility] office in 10-14 days for wound check, staple removal and x-ray.

## 2021-07-11 NOTE — Progress Notes (Signed)
PROGRESS NOTE  Theresa Snyder OBS:[id number] DOB: [birth year] DOA: 07/09/2021 PCP: [facility] [place]  Brief History    Theresa Snyder is a 88 y.o. female with medical history significant of chronic kidney disease stage III/IV, lymphedema, hypertension, hypothyroidism, hyperlipidemia, history of seizure no longer on Keppra, history of large hiatal hernia, ambulatory dysfunction.   This patient presents from home after having a mechanical fall.  She was trying to get of bed and lost her balance.  She fell on her left side and presented to the emergency department with left hip pain and left shoulder pain.  Left shoulder plain film imaging is negative.  Imaging showed a left femoral neck fracture.  ED provider discussed with Dr. Sharlet Salina.  The plan is for left hemiarthroplasty.  Will likely be done tomorrow.  Denies any chest pain.  Denies any shortness of breath.  Denies any urinary complaints.  No cough. No fevers.  Asking for a GI soft diet.  Daughter present at bedside.  The patient lives alone at home.  Pain is well controlled with medications given in the ER.   The patient was admitted to a telemetry bed. Her potassium was 6 this am. It was treated with Lokelma 10 mg once.  Orthopedic surgery was consulted. The patient was taken for left hemiarthroplasty on 07/10/2021. She has tolerated the procedure well.  She is working with PT today.  Consultants  Orthopedic surgery  Procedures  Left hemiarthroplasty  Antibiotics   Anti-infectives (From admission, onward)    Start     Dose/Rate Route Frequency Ordered Stop   07/11/21 0400  ceFAZolin (ANCEF) IVPB 2g/100 mL premix        2 g 200 mL/hr over 30 Minutes Intravenous Every 12 hours 07/10/21 2017 07/11/21 0838   07/10/21 1802  vancomycin (VANCOCIN) powder  Status:  Discontinued          As needed 07/10/21 1802 07/10/21 1831   07/10/21 1528  ceFAZolin (ANCEF) 2-4 GM/100ML-% IVPB       Note to Pharmacy: Maryagnes Amos B: cabinet override      07/10/21 1528 07/10/21 1632   07/10/21 0900  ceFAZolin (ANCEF) IVPB 2g/100 mL premix        2 g 200 mL/hr over 30 Minutes Intravenous To Surgery 07/10/21 0803 07/10/21 1615        Subjective  The patient is resting comfortably. No new complaints.  Objective   Vitals:  Vitals:   07/11/21 0801 07/11/21 1214  BP: 125/65 (!) 110/55  Pulse: 80 83  Resp: 16 18  Temp: 98 F (36.7 C) 98.2 F (36.8 C)  SpO2: 99% 92%    Exam:  Constitutional:  Appears calm and comfortable Respiratory:  CTA bilaterally, no w/r/r.  Respiratory effort normal. No retractions or accessory muscle use Cardiovascular:  RRR, no m/r/g No LE extremity edema   Normal pedal pulses Abdomen:  Abdomen appears normal; no tenderness or masses No hernias No HSM Musculoskeletal:  Digits/nails BUE: no clubbing, cyanosis, petechiae, infection exam of joints, bones, muscles of at least one of following: head/neck, RUE, LUE, RLE, LLE   strength and tone normal, no atrophy, no abnormal movements No tenderness, masses Normal ROM, no contractures  gait and station Skin:  No rashes, lesions, ulcers palpation of skin: no induration or nodules Neurologic:  CN 2-12 intact Sensation all 4 extremities intact  I have personally reviewed the following:   Today's Data  Vitals  Lab Data  CBC CMP  Micro Data    Imaging  CXR  Cardiology Data  EKG  Other Data    Scheduled Meds:  acetaminophen  500 mg Oral Q6H   amLODipine  10 mg Oral Q breakfast   benazepril  20 mg Oral Daily   Chlorhexidine Gluconate Cloth  6 each Topical Daily   docusate sodium  100 mg Oral BID   feeding supplement  237 mL Oral TID BM   gabapentin  300 mg Oral Daily   heparin injection (subcutaneous)  5,000 Units Subcutaneous Q12H   hydrochlorothiazide  25 mg Oral Daily   levothyroxine  88 mcg Oral QAC breakfast   multivitamin with minerals  1 tablet Oral Daily   mupirocin ointment  1  application. Nasal BID   pravastatin  20 mg Oral QHS   Continuous Infusions:  tranexamic acid Stopped (07/10/21 1936)    No problems updated.   LOS: 2 days    A & P  Displaced fracture of the left hip: Orthopedic surgery was consulted and will take the patient for left hemiarthroplasty today.  Hyperkalemia: Due to CKD 3b. Potassium 5.5 this am. Lokelma given x 1 again. Will recheck potassium.  Stage 3b CKD:  Baseline creatinine is 1.4 - 1.8. She presented with creatinine of 2.01. Creatinine improved to 1.78 today.  Hypothyroidism: Continue synthroid 88 mcg daily as at home.  Hypertension: The patient is normotensive although she has not had her hydrochlorothiazide, amlodipine, or benazepril since yesterday. Restart postoperatively as warranted. Will hold benazepril given patient's elevated creatinine and hyperkalemia.  Hyperlipidemia: Continue statin as at home.  I have seen and examined this patient myself. I have spent 32 minutes in her evaluation and car.e  DVT prophylaxis: heparin in the am if allowed by orthopedic surgery Code Status: Full Code Family Communication: None available Disposition Plan: tbd    Mahealani Sulak, DO [facility] Direct contact: see [email or web address]  7PM-7AM contact night coverage as above 07/11/2021, 3:04 PM  LOS: 1 day

## 2021-07-11 NOTE — TOC Progression Note (Signed)
Transition of Care [facility]) - Progression Note    Patient Details  Name: Theresa Snyder MRN: [id number] Date of Birth: 11/02/1929  Transition of Care [facility]) CM/SW [place], RN Phone Number: 07/11/2021, 4:07 PM  Clinical Narrative:     Met with the patient ans her daughter and grand daughter, they are agreeable to go to [facility] SNF She has been to [facility] in the past and would like to return but is agreeable to anywhere in the county except for [facility]       Expected Discharge Plan and Services                                                 Social Determinants of Health (SDOH) Interventions    Readmission Risk Interventions     View : No data to display.

## 2021-07-11 NOTE — NC FL2 (Signed)
[place] LEVEL OF CARE SCREENING TOOL     IDENTIFICATION  Patient Name: Theresa Snyder Birthdate: 1929-10-14 Sex: female Admission Date (Current Location): 07/09/2021  [facility] and [place] Number:  Engineering geologist and Address:  [facility], [address], [place], [place] [address]      Provider Number: [id number]  Attending Physician Name and Address:  Karie Kirks, DO  Relative Name and Phone Number:  [phone] Mikey Bussing Daughter    Current Level of Care: Hospital Recommended Level of Care: [place] Prior Approval Number:    Date Approved/Denied:   PASRR Number: [id number]  Discharge Plan: SNF    Current Diagnoses: Patient Active Problem List   Diagnosis Date Noted   Lymphedema of leg    Hip fracture ([place]) 07/09/2021   Stage 3b chronic kidney disease ([place]) 05/21/2021   Colonic obstruction ([place]) 05/21/2021   Large hiatal hernia 05/21/2021   Severe sepsis with lactic acidosis ([place]) 05/21/2021   Lumbar compression fracture ([place]) 03/24/2020   Fall 03/23/2020   L4 vertebral fracture ([place]) 03/23/2020   Chronic kidney disease 03/04/2015   Adaptive colitis 03/04/2015   BP (high blood pressure) 05/18/2013   Adult hypothyroidism 05/18/2013   HLD (hyperlipidemia) 05/18/2013    Orientation RESPIRATION BLADDER Height & Weight     Self, Time, Situation, Place  Normal Continent Weight: 58 kg Height:  5' (152.4 cm)  BEHAVIORAL SYMPTOMS/MOOD NEUROLOGICAL BOWEL NUTRITION STATUS      Continent Diet (see DC summary)  AMBULATORY STATUS COMMUNICATION OF NEEDS Skin   Extensive Assist Verbally Normal, Surgical wounds                       Personal Care Assistance Level of Assistance  Bathing, Feeding, Dressing Bathing Assistance: Maximum assistance Feeding assistance: Independent Dressing Assistance: Maximum assistance     Functional Limitations Info             SPECIAL CARE FACTORS FREQUENCY   PT (By licensed PT), OT (By licensed OT)     PT Frequency: 5 times per week OT Frequency: 5 times per week            Contractures Contractures Info: Not present    Additional Factors Info  Code Status, Allergies Code Status Info: Full COde Allergies Info: Iodinated Contrast Media, Lorazepam, Risperidone, Sulfamethoxazole-trimethoprim           Current Medications (07/11/2021):  This is the current hospital active medication list Current Facility-Administered Medications  Medication Dose Route Frequency Provider Last Rate Last Admin   acetaminophen (TYLENOL) tablet 650 mg  650 mg Oral Q6H PRN Renee Harder, MD       Or   acetaminophen (TYLENOL) suppository 650 mg  650 mg Rectal Q6H PRN Renee Harder, MD       acetaminophen (TYLENOL) tablet 500 mg  500 mg Oral Q6H Renee Harder, MD   500 mg at 07/11/21 1427   amLODipine (NORVASC) tablet 10 mg  10 mg Oral Q breakfast Renee Harder, MD   10 mg at 07/11/21 0947   benazepril (LOTENSIN) tablet 20 mg  20 mg Oral Daily Renee Harder, MD   20 mg at 07/11/21 0946   Chlorhexidine Gluconate Cloth 2 % PADS 6 each  6 each Topical Daily Swayze, Ava, DO   6 each at 07/11/21 1201   docusate sodium (COLACE) capsule 100 mg  100 mg Oral BID PRN Renee Harder, MD   100 mg at 07/10/21  2044   docusate sodium (COLACE) capsule 100 mg  100 mg Oral BID Renee Harder, MD   100 mg at 07/11/21 0947   feeding supplement (ENSURE ENLIVE / ENSURE PLUS) liquid 237 mL  237 mL Oral TID BM Renee Harder, MD       gabapentin (NEURONTIN) capsule 300 mg  300 mg Oral Daily Renee Harder, MD   300 mg at 07/11/21 0947   heparin injection 5,000 Units  5,000 Units Subcutaneous Q12H Dorothe Pea, RPH   5,000 Units at 07/11/21 7867   hydrochlorothiazide (HYDRODIURIL) tablet 25 mg  25 mg Oral Daily Renee Harder, MD   25 mg at 07/11/21 0946   HYDROcodone-acetaminophen (NORCO) 7.5-325 MG per tablet 1-2 tablet  1-2 tablet Oral Q4H PRN  Renee Harder, MD   1 tablet at 07/11/21 0502   levothyroxine (SYNTHROID) tablet 88 mcg  88 mcg Oral QAC breakfast Renee Harder, MD   88 mcg at 07/11/21 5449   menthol-cetylpyridinium (CEPACOL) lozenge 3 mg  1 lozenge Oral PRN Renee Harder, MD       Or   phenol (CHLORASEPTIC) mouth spray 1 spray  1 spray Mouth/Throat PRN Renee Harder, MD       metoCLOPramide (REGLAN) tablet 5-10 mg  5-10 mg Oral Q8H PRN Renee Harder, MD       Or   metoCLOPramide (REGLAN) injection 5-10 mg  5-10 mg Intravenous Q8H PRN Renee Harder, MD       morphine (PF) 2 MG/ML injection 0.5-1 mg  0.5-1 mg Intravenous Q2H PRN Renee Harder, MD   1 mg at 07/11/21 0608   multivitamin with minerals tablet 1 tablet  1 tablet Oral Daily Renee Harder, MD   1 tablet at 07/11/21 0946   mupirocin ointment (BACTROBAN) 2 % 1 application.  1 application. Nasal BID Renee Harder, MD   1 application. at 07/11/21 1200   ondansetron (ZOFRAN) tablet 4 mg  4 mg Oral Q6H PRN Renee Harder, MD       Or   ondansetron [facility]) injection 4 mg  4 mg Intravenous Q6H PRN Renee Harder, MD       oxyCODONE (Oxy IR/ROXICODONE) immediate release tablet 5 mg  5 mg Oral Q4H PRN Renee Harder, MD   5 mg at 07/11/21 1200   pravastatin (PRAVACHOL) tablet 20 mg  20 mg Oral Dewain Penning, MD   20 mg at 07/10/21 2045   tranexamic acid (CYKLOKAPRON) IVPB 1,000 mg  1,000 mg Intravenous Once Renee Harder, MD   Stopped at 07/10/21 1936     Discharge Medications: Please see discharge summary for a list of discharge medications.  Relevant Imaging Results:  Relevant Lab Results:   Additional Information SS# [id number]  Conception Oms, RN

## 2021-07-12 DIAGNOSIS — S72002A Fracture of unspecified part of neck of left femur, initial encounter for closed fracture: Secondary | ICD-10-CM | POA: Diagnosis not present

## 2021-07-12 DIAGNOSIS — E039 Hypothyroidism, unspecified: Secondary | ICD-10-CM | POA: Diagnosis not present

## 2021-07-12 DIAGNOSIS — I150 Renovascular hypertension: Secondary | ICD-10-CM | POA: Diagnosis not present

## 2021-07-12 DIAGNOSIS — E782 Mixed hyperlipidemia: Secondary | ICD-10-CM | POA: Diagnosis not present

## 2021-07-12 LAB — BASIC METABOLIC PANEL
Anion gap: 8 (ref 5–15)
BUN: 57 mg/dL — ABNORMAL HIGH (ref 8–23)
CO2: 26 mmol/L (ref 22–32)
Calcium: 8.7 mg/dL — ABNORMAL LOW (ref 8.9–10.3)
Chloride: 100 mmol/L (ref 98–111)
Creatinine, Ser: 1.85 mg/dL — ABNORMAL HIGH (ref 0.44–1.00)
GFR, Estimated: 26 mL/min — ABNORMAL LOW (ref >60)
Glucose, Bld: 124 mg/dL — ABNORMAL HIGH (ref 70–99)
Potassium: 4.7 mmol/L (ref 3.5–5.1)
Sodium: 134 mmol/L — ABNORMAL LOW (ref 135–145)

## 2021-07-12 LAB — CBC
HCT: 25.2 % — ABNORMAL LOW (ref 36.0–46.0)
Hemoglobin: 8.4 g/dL — ABNORMAL LOW (ref 12.0–15.0)
MCH: 33.1 pg (ref 26.0–34.0)
MCHC: 33.3 g/dL (ref 30.0–36.0)
MCV: 99.2 fL (ref 80.0–100.0)
Platelets: 167 10*3/uL (ref 150–400)
RBC: 2.54 MIL/uL — ABNORMAL LOW (ref 3.87–5.11)
RDW: 12.1 % (ref 11.5–15.5)
WBC: 14.8 10*3/uL — ABNORMAL HIGH (ref 4.0–10.5)
nRBC: 0 % (ref 0.0–0.2)

## 2021-07-12 LAB — GLUCOSE, CAPILLARY: Glucose-Capillary: 115 mg/dL — ABNORMAL HIGH (ref 70–99)

## 2021-07-12 LAB — MAGNESIUM: Magnesium: 1.8 mg/dL (ref 1.7–2.4)

## 2021-07-12 MED ORDER — BENAZEPRIL HCL 10 MG PO TABS
10.0000 mg | ORAL_TABLET | Freq: Every day | ORAL | Status: DC
  Administered 2021-07-12 – 2021-07-14 (×3): 10 mg via ORAL
  Filled 2021-07-12 (×3): qty 1

## 2021-07-12 MED ORDER — METOPROLOL TARTRATE 25 MG PO TABS
12.5000 mg | ORAL_TABLET | Freq: Two times a day (BID) | ORAL | Status: DC
  Administered 2021-07-12 (×2): 12.5 mg via ORAL
  Filled 2021-07-12 (×2): qty 1

## 2021-07-12 MED ORDER — GUAIFENESIN-DM 100-10 MG/5ML PO SYRP
5.0000 mL | ORAL_SOLUTION | ORAL | Status: DC | PRN
Start: 2021-07-12 — End: 2021-07-14
  Administered 2021-07-12: 5 mL via ORAL
  Filled 2021-07-12: qty 5

## 2021-07-12 NOTE — Evaluation (Signed)
Occupational Therapy Evaluation Patient Details Name: Theresa LOY MRN: [id number] DOB: 07/22/1929 Today's Date: 07/12/2021   History of Present Illness The pt is a 88 yo female s/p L hemiarthroplasty due to a hip fracture. PMH of HTN, aspiration PNA 2/2 hiatal hernia and opiod use in April, seizues, CKD III. Pt also noted to have elevated K+, cleared by MD for therapy after lokelma to address this.   Clinical Impression   Pt seen this date for OT evaluation.  Pt reports she lives at home alone in a mobile home with her son living next door and a daughter nearby who also helps as needed.  Pt was independent prior to admission with self care, light homemaking tasks and has an aide who comes in 2 times a week to assist with bathing.  She does not get into the bath/shower, just does sponge baths.  Daughter assists with grocery shopping, errands and any homemaking tasks as needed.  Pt presents with muscle weakness, decreased transfers, decreased functional mobility and decreased ability to perform self care tasks s/p left hemiarthroplasty after recent fall at home while getting out of bed.  She would benefit from skilled OT services to maximize safety and independence in necessary daily tasks.  She will likely benefit from STR upon discharge.       Recommendations for follow up therapy are one component of a multi-disciplinary discharge planning process, led by the attending physician.  Recommendations may be updated based on patient status, additional functional criteria and insurance authorization.   Follow Up Recommendations  Skilled nursing-short term rehab (<3 hours/day)    Assistance Recommended at Discharge Frequent or constant Supervision/Assistance  Patient can return home with the following A little help with walking and/or transfers;A lot of help with bathing/dressing/bathroom;Assistance with cooking/housework;Assist for transportation    Functional Status Assessment  Patient has had  a recent decline in their functional status and demonstrates the ability to make significant improvements in function in a reasonable and predictable amount of time.  Equipment Recommendations       Recommendations for Other Services       Precautions / Restrictions Precautions Precautions: Fall;Posterior Hip Restrictions Weight Bearing Restrictions: Yes LLE Weight Bearing: Weight bearing as tolerated      Mobility Bed Mobility                    Transfers                          Balance                                           ADL either performed or assessed with clinical judgement   ADL Overall ADL's : Needs assistance/impaired Eating/Feeding: Set up Eating/Feeding Details (indicate cue type and reason): Pt able to self feed this am from seated position once tray was set up Grooming: Sitting;Set up   Upper Body Bathing: Minimal assistance   Lower Body Bathing: Total assistance Lower Body Bathing Details (indicate cue type and reason): LB bathing with total assist secondary to posterior precautions, will plan to  instruct on AE Upper Body Dressing : Set up   Lower Body Dressing: Total assistance Lower Body Dressing Details (indicate cue type and reason): secondary to precautions, will plan to instruct in AE   Toilet Transfer Details (indicate cue type  and reason): Not tested this date, pts HR on telemetry went up during session while seated to 140-200 per nrsg, therefore no transfers or mobility were performed.           General ADL Comments: Will continue to assess transfers and functional mobility after pt seen by MD and medically stable to proceed.     Vision Baseline Vision/History: 0 No visual deficits       Perception     Praxis      Pertinent Vitals/Pain Pain Assessment Pain Assessment: No/denies pain Pain Score: 0-No pain     Hand Dominance Right   Extremity/Trunk Assessment Upper Extremity  Assessment Upper Extremity Assessment: Generalized weakness       Cervical / Trunk Assessment Cervical / Trunk Assessment: Kyphotic   Communication Communication Communication: No difficulties   Cognition Arousal/Alertness: Awake/alert Behavior During Therapy: WFL for tasks assessed/performed Overall Cognitive Status: Within Functional Limits for tasks assessed                                       General Comments   Pt seen for self feeding and grooming from seated position this date in sitting.  Transfers and mobility deferred as requested by nursing secondary to increased heart rate during session, monitored by telemetry.      Exercises     Shoulder Instructions      Home Living Family/patient expects to be discharged to:: Private residence Living Arrangements: Alone Available Help at Discharge: Family;Available PRN/intermittently Type of Home: Mobile home Home Access: Stairs to enter Entrance Stairs-Number of Steps: 3 Entrance Stairs-Rails: Can reach both Home Layout: One level     Bathroom Shower/Tub: Tub/shower unit;Curtain   Biochemist, clinical: Handicapped height Bathroom Accessibility: Yes   Home Equipment: Rollator (4 wheels)          Prior Functioning/Environment Prior Level of Function : Independent/Modified Independent             Mobility Comments: pt has 3 wheeled walker ADLs Comments: pt able to perform ADLs independently but only sink baths while family is not present. showers once a week when her daughter is in the home or when she has an aide to come in 2 times a week        OT Problem List: Decreased strength;Decreased activity tolerance;Decreased knowledge of use of DME or AE;Impaired balance (sitting and/or standing);Decreased knowledge of precautions;Pain      OT Treatment/Interventions: Self-care/ADL training;Balance training;Therapeutic exercise;DME and/or AE instruction;Patient/family education;Therapeutic activities     OT Goals(Current goals can be found in the care plan section) Acute Rehab OT Goals Patient Stated Goal: Pt would like to return home and be as independent as possible. OT Goal Formulation: With patient Time For Goal Achievement: 07/26/21 Potential to Achieve Goals: Good ADL Goals Pt Will Perform Lower Body Bathing: with min assist;with adaptive equipment Pt Will Perform Lower Body Dressing: with min assist;with adaptive equipment Pt Will Transfer to Toilet: with min guard assist  OT Frequency: Min 2X/week    Co-evaluation              AM-PAC OT "6 Clicks" Daily Activity     Outcome Measure Help from another person eating meals?: None Help from another person taking care of personal grooming?: None Help from another person toileting, which includes using toliet, bedpan, or urinal?: A Little Help from another person bathing (including washing, rinsing, drying)?: A Lot  Help from another person to put on and taking off regular upper body clothing?: None Help from another person to put on and taking off regular lower body clothing?: A Lot 6 Click Score: 19   End of Session    Activity Tolerance: Other (comment) (limited by increased HR this date) Patient left: in chair;with call bell/phone within reach;with chair alarm set  OT Visit Diagnosis: Unsteadiness on feet (R26.81);Muscle weakness (generalized) (M62.81);Pain Pain - part of body: Hip                Time: 9688-6484 OT Time Calculation (min): 25 min Charges:  OT General Charges $OT Visit: 1 Visit OT Evaluation $OT Eval Low Complexity: 1 Low OT Treatments $Self Care/Home Management : 8-22 mins  Andi Mahaffy T Kevon Tench, OTR/L, CLT  07/12/2021, 9:43 AM

## 2021-07-12 NOTE — Progress Notes (Signed)
PROGRESS NOTE  Theresa Snyder:[id number] DOB: Nov 27, 1929 DOA: 07/09/2021 PCP: [facility] [place]  Brief History    Theresa Snyder is a 88 y.o. female with medical history significant of chronic kidney disease stage III/IV, lymphedema, hypertension, hypothyroidism, hyperlipidemia, history of seizure no longer on Keppra, history of large hiatal hernia, ambulatory dysfunction.   This patient presents from home after having a mechanical fall.  She was trying to get of bed and lost her balance.  She fell on her left side and presented to the emergency department with left hip pain and left shoulder pain.  Left shoulder plain film imaging is negative.  Imaging showed a left femoral neck fracture.  ED provider discussed with Dr. Sharlet Salina.  The plan is for left hemiarthroplasty.  Will likely be done tomorrow.  Denies any chest pain.  Denies any shortness of breath.  Denies any urinary complaints.  No cough. No fevers.  Asking for a GI soft diet.  Daughter present at bedside.  The patient lives alone at home.  Pain is well controlled with medications given in the ER.   The patient was admitted to a telemetry bed. Her potassium was 6 this am. It was treated with Lokelma 10 mg once.  Orthopedic surgery was consulted. The patient was taken for left hemiarthroplasty on 07/10/2021. She has tolerated the procedure well.  She is working with PT . They have recommended SNF placement.  Consultants  Orthopedic surgery  Procedures  Left hemiarthroplasty  Antibiotics   Anti-infectives (From admission, onward)    Start     Dose/Rate Route Frequency Ordered Stop   07/11/21 0400  ceFAZolin (ANCEF) IVPB 2g/100 mL premix        2 g 200 mL/hr over 30 Minutes Intravenous Every 12 hours 07/10/21 2017 07/11/21 0838   07/10/21 1802  vancomycin (VANCOCIN) powder  Status:  Discontinued          As needed 07/10/21 1802 07/10/21 1831   07/10/21 1528  ceFAZolin (ANCEF) 2-4 GM/100ML-% IVPB        Note to Pharmacy: Maryagnes Amos B: cabinet override      07/10/21 1528 07/10/21 1632   07/10/21 0900  ceFAZolin (ANCEF) IVPB 2g/100 mL premix        2 g 200 mL/hr over 30 Minutes Intravenous To Surgery 07/10/21 0803 07/10/21 1615        Subjective  The patient is resting comfortably. No new complaints.  Objective   Vitals:  Vitals:   07/12/21 0053 07/12/21 0524  BP: (!) 136/53 (!) 112/57  Pulse: (!) 102 89  Resp:  16  Temp:  98.7 F (37.1 C)  SpO2:  95%    Exam:  Constitutional:  Appears calm and comfortable Respiratory:  CTA bilaterally, no w/r/r.  Respiratory effort normal. No retractions or accessory muscle use Cardiovascular:  RRR, no m/r/g No LE extremity edema   Normal pedal pulses Abdomen:  Abdomen appears normal; no tenderness or masses No hernias No HSM Musculoskeletal:  Digits/nails BUE: no clubbing, cyanosis, petechiae, infection Skin:  No rashes, lesions, ulcers palpation of skin: no induration or nodules Neurologic:  CN 2-12 intact Sensation all 4 extremities intact  I have personally reviewed the following:   Today's Data  Vitals  Lab Data  CBC CMP  Micro Data    Imaging  CXR  Cardiology Data  EKG  Other Data    Scheduled Meds:  benazepril  10 mg Oral Daily   Chlorhexidine Gluconate Cloth  6 each Topical Daily   docusate sodium  100 mg Oral BID   feeding supplement  237 mL Oral TID BM   gabapentin  300 mg Oral Daily   heparin injection (subcutaneous)  5,000 Units Subcutaneous Q12H   hydrochlorothiazide  25 mg Oral Daily   levothyroxine  88 mcg Oral QAC breakfast   metoprolol tartrate  12.5 mg Oral BID   multivitamin with minerals  1 tablet Oral Daily   mupirocin ointment  1 application. Nasal BID   pravastatin  20 mg Oral QHS   Continuous Infusions:  tranexamic acid Stopped (07/10/21 1936)    No problems updated.   LOS: 3 days    A & P  Displaced fracture of the left hip: Orthopedic surgery was  consulted. The patient went for ORIF on 07/10/2021. She tolerated the procedure well. She has been evaluated by PT. Recommendation is for discharge to SNF.   Hyperkalemia: Resolved. Due to CKD 3b. Monitor. Lokelma as needed to correct.  Stage 3b CKD:  Baseline creatinine is 1.4 - 1.8. She presented with creatinine of 2.01. Creatinine improved to 1.85 today.  Acute on chronic anemia: Chronic anemia due to ESRD with acute post-op anemia  Hypothyroidism: Continue synthroid 88 mcg daily as at home.  Hypertension: The patient is normotensive although she has not had her hydrochlorothiazide, amlodipine, or benazepril since yesterday. Restart postoperatively as warranted. Will hold benazepril given patient's elevated creatinine and hyperkalemia.  Hyperlipidemia: Continue statin as at home.  I have seen and examined this patient myself. I have spent 32 minutes in her evaluation and car.e  DVT prophylaxis: heparin in the am if allowed by orthopedic surgery Code Status: Full Code Family Communication: None available Disposition Plan: tbd    Theresa Koelzer, DO [facility] Direct contact: see [email or web address]  7PM-7AM contact night coverage as above 07/12/2021, 2:22 PM  LOS: 1 day

## 2021-07-12 NOTE — Progress Notes (Signed)
  Subjective:  Patient reports pain as moderate.  Sitting up in bed.  Objective:   VITALS:   Vitals:   07/11/21 2115 07/12/21 0053 07/12/21 0524 07/12/21 1634  BP: 117/64 (!) 136/53 (!) 112/57 (!) 125/54  Pulse: 94 (!) 102 89 (!) 102  Resp: '18  16 16  '$ Temp: 98.1 F (36.7 C)  98.7 F (37.1 C) 98.3 F (36.8 C)  TempSrc:      SpO2: 93%  95% 91%  Weight:      Height:        PHYSICAL EXAM:  ABD soft Sensation intact distally Dorsiflexion/Plantar flexion intact Incision: scant drainage No cellulitis present Compartment soft  LABS  Results for orders placed or performed during the hospital encounter of 07/09/21 (from the past 24 hour(s))  CBC     Status: Abnormal   Collection Time: 07/12/21  4:45 AM  Result Value Ref Range   WBC 14.8 (H) 4.0 - 10.5 K/uL   RBC 2.54 (L) 3.87 - 5.11 MIL/uL   Hemoglobin 8.4 (L) 12.0 - 15.0 g/dL   HCT 25.2 (L) 36.0 - 46.0 %   MCV 99.2 80.0 - 100.0 fL   MCH 33.1 26.0 - 34.0 pg   MCHC 33.3 30.0 - 36.0 g/dL   RDW 12.1 11.5 - 15.5 %   Platelets 167 150 - 400 K/uL   nRBC 0.0 0.0 - 0.2 %  Basic metabolic panel     Status: Abnormal   Collection Time: 07/12/21  4:45 AM  Result Value Ref Range   Sodium 134 (L) 135 - 145 mmol/L   Potassium 4.7 3.5 - 5.1 mmol/L   Chloride 100 98 - 111 mmol/L   CO2 26 22 - 32 mmol/L   Glucose, Bld 124 (H) 70 - 99 mg/dL   BUN 57 (H) 8 - 23 mg/dL   Creatinine, Ser 1.85 (H) 0.44 - 1.00 mg/dL   Calcium 8.7 (L) 8.9 - 10.3 mg/dL   GFR, Estimated 26 (L) >60 mL/min   Anion gap 8 5 - 15  Magnesium     Status: None   Collection Time: 07/12/21  4:45 AM  Result Value Ref Range   Magnesium 1.8 1.7 - 2.4 mg/dL  Glucose, capillary     Status: Abnormal   Collection Time: 07/12/21 12:22 PM  Result Value Ref Range   Glucose-Capillary 115 (H) 70 - 99 mg/dL    No results found.  Assessment/Plan: 2 Days Post-Op   Principal Problem:   Hip fracture (HCC) Active Problems:   BP (high blood pressure)   Adult  hypothyroidism   HLD (hyperlipidemia)   Stage 3b chronic kidney disease (HCC)   Lymphedema of leg   Up with therapy Discharge to SNF when medically stable and bed available WBAT LLE, posterior hip precautions DVT ppx: Lovenox x 14 days Follow-up in clinic in 2 weeks for xrays and staple removal with Dr. Sharee Holster , MD 07/12/2021, 7:42 PM

## 2021-07-12 NOTE — Plan of Care (Signed)

## 2021-07-12 NOTE — Progress Notes (Addendum)
Notified MD of pt having runs of SVT rate 140-200 lasting 3.6 seconds.   Received new order from MD

## 2021-07-12 NOTE — Progress Notes (Signed)
Physical Therapy Treatment Patient Details Name: Theresa Snyder MRN: [id number] DOB: 02-19-1929 Today's Date: 07/12/2021   History of Present Illness The pt is a 88 yo female s/p L hemiarthroplasty due to a hip fracture. PMH of HTN, aspiration PNA 2/2 hiatal hernia and opiod use in April, seizues, CKD III. Pt also noted to have elevated K+, cleared by MD for therapy after lokelma to address this.    PT Comments    Pt received in Semi-Fowler's position and agreeable to therapy.  Pt agreeable to therapy and motivated throughout session.  Pt notes to not be in pain when lying down, but significant amount of pain when standing on the L LE.  Pt able to perform supine to sit with modA using HHA from therapist to come upright.  Pt then transferred to standing from EOB and requested to use the bathroom.  Pt able to take a few steps before noting that she would be unlikely to make it all the way to the bathroom.  Pt ambulated to the [facility] and was assisted with peri-care.  Pt then requested to return to bed and was assisted back to bed.  Pt requires modA to get LE's back into bed and then performed transfer to [facility] for proper positioning.  Pt left with family members and call bell within reach.  Current discharge plans to SNF remain appropriate at this time.  Pt will continue to benefit from skilled therapy in order to address deficits listed below.    Recommendations for follow up therapy are one component of a multi-disciplinary discharge planning process, led by the attending physician.  Recommendations may be updated based on patient status, additional functional criteria and insurance authorization.  Follow Up Recommendations  Skilled nursing-short term rehab (<3 hours/day)     Assistance Recommended at Discharge Frequent or constant Supervision/Assistance  Patient can return home with the following Help with stairs or ramp for entrance;Assist for transportation;Assistance with cooking/housework;Direct  supervision/assist for financial management;A lot of help with walking and/or transfers;A lot of help with bathing/dressing/bathroom   Equipment Recommendations  Other (comment) (TBD at next venue of care)    Recommendations for Other Services       Precautions / Restrictions Precautions Precautions: Fall;Posterior Hip Restrictions Weight Bearing Restrictions: Yes LLE Weight Bearing: Weight bearing as tolerated     Mobility  Bed Mobility Overal bed mobility: Needs Assistance Bed Mobility: Supine to Sit     Supine to sit: Min assist     General bed mobility comments: pt required HHA to come into seated EOB.    Transfers Overall transfer level: Needs assistance Equipment used: Rolling walker (2 wheels) Transfers: Sit to/from Stand Sit to Stand: Min assist                Ambulation/Gait Ambulation/Gait assistance: Min assist Gait Distance (Feet): 8 Feet Assistive device: Rolling walker (2 wheels) Gait Pattern/deviations: Decreased stance time - left, Decreased stride length, Step-to pattern Gait velocity: decreased     General Gait Details: pt able noted some fatigue and painful symptom response with ambulation, but overall handles well and puts forth good effort.   Stairs             Wheelchair Mobility    Modified Rankin (Stroke Patients Only)       Balance Overall balance assessment: Needs assistance Sitting-balance support: Feet supported Sitting balance-Leahy Scale: Fair     Standing balance support: Reliant on assistive device for balance Standing balance-Leahy Scale: Poor  Cognition Arousal/Alertness: Awake/alert Behavior During Therapy: WFL for tasks assessed/performed Overall Cognitive Status: Within Functional Limits for tasks assessed                                          Exercises      General Comments        Pertinent Vitals/Pain Pain Assessment Pain  Assessment: No/denies pain Pain Score: 0-No pain    Home Living                          Prior Function            PT Goals (current goals can now be found in the care plan section) Acute Rehab PT Goals Patient Stated Goal: to go home PT Goal Formulation: With patient Time For Goal Achievement: 07/25/21 Potential to Achieve Goals: Fair Progress towards PT goals: Progressing toward goals    Frequency    7X/week      PT Plan Current plan remains appropriate    Co-evaluation              AM-PAC PT "6 Clicks" Mobility   Outcome Measure  Help needed turning from your back to your side while in a flat bed without using bedrails?: A Lot Help needed moving from lying on your back to sitting on the side of a flat bed without using bedrails?: A Lot Help needed moving to and from a bed to a chair (including a wheelchair)?: A Little Help needed standing up from a chair using your arms (e.g., wheelchair or bedside chair)?: A Little Help needed to walk in hospital room?: A Lot Help needed climbing 3-5 steps with a railing? : Total 6 Click Score: 13    End of Session Equipment Utilized During Treatment: Gait belt Activity Tolerance: Patient limited by fatigue;Patient limited by pain Patient left: with call bell/phone within reach;in bed;with bed alarm set Nurse Communication: Mobility status PT Visit Diagnosis: Other abnormalities of gait and mobility (R26.89);Difficulty in walking, not elsewhere classified (R26.2);Muscle weakness (generalized) (M62.81)     Time: 3299-2426 PT Time Calculation (min) (ACUTE ONLY): 31 min  Charges:  $Therapeutic Activity: 23-37 mins                     Gwenlyn Saran, PT, DPT 07/12/21, 4:20 PM    Christie Nottingham 07/12/2021, 4:13 PM

## 2021-07-13 DIAGNOSIS — S72002A Fracture of unspecified part of neck of left femur, initial encounter for closed fracture: Secondary | ICD-10-CM | POA: Diagnosis not present

## 2021-07-13 DIAGNOSIS — E039 Hypothyroidism, unspecified: Secondary | ICD-10-CM | POA: Diagnosis not present

## 2021-07-13 DIAGNOSIS — I150 Renovascular hypertension: Secondary | ICD-10-CM | POA: Diagnosis not present

## 2021-07-13 DIAGNOSIS — E782 Mixed hyperlipidemia: Secondary | ICD-10-CM | POA: Diagnosis not present

## 2021-07-13 LAB — BASIC METABOLIC PANEL
Anion gap: 7 (ref 5–15)
BUN: 63 mg/dL — ABNORMAL HIGH (ref 8–23)
CO2: 28 mmol/L (ref 22–32)
Calcium: 8.7 mg/dL — ABNORMAL LOW (ref 8.9–10.3)
Chloride: 99 mmol/L (ref 98–111)
Creatinine, Ser: 1.38 mg/dL — ABNORMAL HIGH (ref 0.44–1.00)
GFR, Estimated: 37 mL/min — ABNORMAL LOW (ref >60)
Glucose, Bld: 114 mg/dL — ABNORMAL HIGH (ref 70–99)
Potassium: 4.8 mmol/L (ref 3.5–5.1)
Sodium: 134 mmol/L — ABNORMAL LOW (ref 135–145)

## 2021-07-13 LAB — CBC
HCT: 28.6 % — ABNORMAL LOW (ref 36.0–46.0)
Hemoglobin: 9.5 g/dL — ABNORMAL LOW (ref 12.0–15.0)
MCH: 32.9 pg (ref 26.0–34.0)
MCHC: 33.2 g/dL (ref 30.0–36.0)
MCV: 99 fL (ref 80.0–100.0)
Platelets: 224 10*3/uL (ref 150–400)
RBC: 2.89 MIL/uL — ABNORMAL LOW (ref 3.87–5.11)
RDW: 11.9 % (ref 11.5–15.5)
WBC: 15 10*3/uL — ABNORMAL HIGH (ref 4.0–10.5)
nRBC: 0 % (ref 0.0–0.2)

## 2021-07-13 LAB — GLUCOSE, CAPILLARY: Glucose-Capillary: 150 mg/dL — ABNORMAL HIGH (ref 70–99)

## 2021-07-13 MED ORDER — METOPROLOL TARTRATE 25 MG PO TABS
25.0000 mg | ORAL_TABLET | Freq: Two times a day (BID) | ORAL | Status: DC
  Administered 2021-07-13 – 2021-07-14 (×3): 25 mg via ORAL
  Filled 2021-07-13 (×3): qty 1

## 2021-07-13 NOTE — Progress Notes (Signed)
PROGRESS NOTE  Theresa Snyder ZJI:[id number] DOB: 1929-04-08 DOA: 07/09/2021 PCP: [facility] [place] [facility]  Brief History    Theresa Snyder is a 88 y.o. female with medical history significant of chronic kidney disease stage III/IV, lymphedema, hypertension, hypothyroidism, hyperlipidemia, history of seizure no longer on Keppra, history of large hiatal hernia, ambulatory dysfunction.   This patient presents from home after having a mechanical fall.  She was trying to get of bed and lost her balance.  She fell on her left side and presented to the emergency department with left hip pain and left shoulder pain.  Left shoulder plain film imaging is negative.  Imaging showed a left femoral neck fracture.  ED provider discussed with Dr. Sharlet Salina.  The plan is for left hemiarthroplasty.  Will likely be done tomorrow.  Denies any chest pain.  Denies any shortness of breath.  Denies any urinary complaints.  No cough. No fevers.  Asking for a GI soft diet.  Daughter present at bedside.  The patient lives alone at home.  Pain is well controlled with medications given in the ER.   The patient was admitted to a telemetry bed. Her potassium was 6 this am. It was treated with Lokelma 10 mg once.  Orthopedic surgery was consulted. The patient was taken for left hemiarthroplasty on 07/10/2021. She has tolerated the procedure well.  She is awaiting SNF placement.  Consultants  Orthopedic surgery  Procedures  Left hemiarthroplasty  Antibiotics   Anti-infectives (From admission, onward)    Start     Dose/Rate Route Frequency Ordered Stop   07/11/21 0400  ceFAZolin (ANCEF) IVPB 2g/100 mL premix        2 g 200 mL/hr over 30 Minutes Intravenous Every 12 hours 07/10/21 2017 07/11/21 0838   07/10/21 1802  vancomycin (VANCOCIN) powder  Status:  Discontinued          As needed 07/10/21 1802 07/10/21 1831   07/10/21 1528  ceFAZolin (ANCEF) 2-4 GM/100ML-% IVPB       Note to Pharmacy: Maryagnes Amos B: cabinet override      07/10/21 1528 07/10/21 1632   07/10/21 0900  ceFAZolin (ANCEF) IVPB 2g/100 mL premix        2 g 200 mL/hr over 30 Minutes Intravenous To Surgery 07/10/21 0803 07/10/21 1615        Subjective  The patient is resting comfortably. No new complaints.  Objective   Vitals:  Vitals:   07/13/21 0804 07/13/21 1128  BP: (!) 167/70 (!) 114/59  Pulse: (!) 105 73  Resp: 20 (!) 24  Temp: 98.3 F (36.8 C) (!) 97.5 F (36.4 C)  SpO2: 95% 100%    Exam:  Constitutional:  Appears calm and comfortable Respiratory:  CTA bilaterally, no w/r/r.  Respiratory effort normal. No retractions or accessory muscle use Cardiovascular:  RRR, no m/r/g No LE extremity edema   Normal pedal pulses Abdomen:  Abdomen appears normal; no tenderness or masses No hernias No HSM Musculoskeletal:  Digits/nails BUE: no clubbing, cyanosis, petechiae, infection Skin:  No rashes, lesions, ulcers palpation of skin: no induration or nodules Neurologic:  CN 2-12 intact Sensation all 4 extremities intact  I have personally reviewed the following:   Today's Data  Vitals  Lab Data  CBC CMP  Micro Data    Imaging  CXR  Cardiology Data  EKG  Other Data    Scheduled Meds:  benazepril  10 mg Oral Daily   docusate sodium  100 mg  Oral BID   feeding supplement  237 mL Oral TID BM   gabapentin  300 mg Oral Daily   heparin injection (subcutaneous)  5,000 Units Subcutaneous Q12H   hydrochlorothiazide  25 mg Oral Daily   levothyroxine  88 mcg Oral QAC breakfast   metoprolol tartrate  25 mg Oral BID   multivitamin with minerals  1 tablet Oral Daily   pravastatin  20 mg Oral QHS   Continuous Infusions:  tranexamic acid Stopped (07/10/21 1936)    No problems updated.   LOS: 4 days    A & P  Displaced fracture of the left hip: Orthopedic surgery was consulted. The patient went for ORIF on 07/10/2021. She tolerated the procedure well. She has been evaluated by PT.  Recommendation is for discharge to SNF.   Hyperkalemia: Resolved. Due to CKD 3b. Monitor. Lokelma as needed to correct.  Stage 3b CKD:  Baseline creatinine is 1.4 - 1.8. She presented with creatinine of 2.01. Creatinine improved to 1.85 today.  Acute on chronic anemia: Chronic anemia due to ESRD with acute post-op anemia  Hypothyroidism: Continue synthroid 88 mcg daily as at home.  Leukocytosis: Likely to   Hypertension: The patient is normotensive although she has not had her hydrochlorothiazide, amlodipine, or benazepril since yesterday. Restart postoperatively as warranted. Will hold benazepril given patient's elevated creatinine and hyperkalemia.  Hyperlipidemia: Continue statin as at home.  I have seen and examined this patient myself. I have spent 32 minutes in her evaluation and care.  DVT prophylaxis: heparin in the am if allowed by orthopedic surgery Code Status: Full Code Family Communication: None available Disposition Plan: SNF   Theresa Claros, DO [facility] Direct contact: see [email or web address]  7PM-7AM contact night coverage as above 07/13/2021, 1:49 PM  LOS: 1 day

## 2021-07-13 NOTE — Progress Notes (Signed)
Physical Therapy Treatment Patient Details Name: Theresa Snyder MRN: [id number] DOB: 08/03/1929 Today's Date: 07/13/2021   History of Present Illness The pt is a 88 yo female s/p L hemiarthroplasty due to a hip fracture. PMH of HTN, aspiration PNA 2/2 hiatal hernia and opiod use in April, seizues, CKD III. Pt also noted to have elevated K+, cleared by MD for therapy after lokelma to address this.    PT Comments    Pt in bed stating she just got back to bed from BM on BSC (still in commode bucket).  Stated she initially called for help but didn't want to "bother the girls" and did it on her own.  Educated on need to call for assist and wait for overall safety and fall prevention.  She was able to do task on her own today and get legs back in bed but she did say pain was increased from strain of activity.  She agrees to gait.  Able to get to EOB and stand with min guard/supervision.  Walked with slow dec step length and height steps with cues for walker position to door where she sat briefly in chair in hallway prior to walking back to recliner.  Remained up after session.  Stressed calling for assist with call bell on lap and chair alarm on.   Recommendations for follow up therapy are one component of a multi-disciplinary discharge planning process, led by the attending physician.  Recommendations may be updated based on patient status, additional functional criteria and insurance authorization.  Follow Up Recommendations  Skilled nursing-short term rehab (<3 hours/day)     Assistance Recommended at Discharge Frequent or constant Supervision/Assistance  Patient can return home with the following Help with stairs or ramp for entrance;Assist for transportation;Assistance with cooking/housework;Direct supervision/assist for financial management;A lot of help with walking and/or transfers;A lot of help with bathing/dressing/bathroom   Equipment Recommendations  Other (comment) (TBD at next venue of  care)    Recommendations for Other Services       Precautions / Restrictions Precautions Precautions: Fall;Posterior Hip Restrictions Weight Bearing Restrictions: Yes LLE Weight Bearing: Weight bearing as tolerated     Mobility  Bed Mobility Overal bed mobility: Needs Assistance Bed Mobility: Supine to Sit     Supine to sit: Supervision          Transfers Overall transfer level: Needs assistance Equipment used: Rolling walker (2 wheels) Transfers: Sit to/from Stand Sit to Stand: Min guard                Ambulation/Gait Ambulation/Gait assistance: Min Web designer (Feet): 12 Feet Assistive device: Rolling walker (2 wheels)   Gait velocity: decreased     General Gait Details: to/from door with seated rest in chair outside of door   Stairs             Wheelchair Mobility    Modified Rankin (Stroke Patients Only)       Balance Overall balance assessment: Needs assistance Sitting-balance support: Feet supported Sitting balance-Leahy Scale: Fair     Standing balance support: Reliant on assistive device for balance Standing balance-Leahy Scale: Poor                              Cognition Arousal/Alertness: Awake/alert Behavior During Therapy: WFL for tasks assessed/performed Overall Cognitive Status: Within Functional Limits for tasks assessed  Exercises      General Comments        Pertinent Vitals/Pain Pain Assessment Pain Assessment: Faces Faces Pain Scale: Hurts little more Pain Location: L hip Pain Descriptors / Indicators: Aching, Guarding, Grimacing, Moaning Pain Intervention(s): Limited activity within patient's tolerance, Monitored during session, Repositioned    Home Living                          Prior Function            PT Goals (current goals can now be found in the care plan section) Progress towards PT goals:  Progressing toward goals    Frequency    7X/week      PT Plan Current plan remains appropriate    Co-evaluation              AM-PAC PT "6 Clicks" Mobility   Outcome Measure  Help needed turning from your back to your side while in a flat bed without using bedrails?: A Lot Help needed moving from lying on your back to sitting on the side of a flat bed without using bedrails?: A Lot Help needed moving to and from a bed to a chair (including a wheelchair)?: A Little Help needed standing up from a chair using your arms (e.g., wheelchair or bedside chair)?: A Little Help needed to walk in hospital room?: A Little Help needed climbing 3-5 steps with a railing? : Total 6 Click Score: 14    End of Session Equipment Utilized During Treatment: Gait belt Activity Tolerance: Patient limited by fatigue;Patient limited by pain Patient left: with call bell/phone within reach;in bed;with bed alarm set Nurse Communication: Mobility status PT Visit Diagnosis: Other abnormalities of gait and mobility (R26.89);Difficulty in walking, not elsewhere classified (R26.2);Muscle weakness (generalized) (M62.81)     Time: 8315-1761 PT Time Calculation (min) (ACUTE ONLY): 12 min  Charges:  $Gait Training: 8-22 mins                   Chesley Noon, PTA 07/13/21, 10:00 AM

## 2021-07-13 NOTE — Progress Notes (Signed)
  Subjective:  Patient reports pain as mild.  Wants to go home.  Objective:   VITALS:   Vitals:   07/12/21 2052 07/13/21 0636 07/13/21 0804 07/13/21 1128  BP: (!) 106/43 137/64 (!) 167/70 (!) 114/59  Pulse: 77 91 (!) 105 73  Resp: '16 16 20 '$ (!) 24  Temp: 97.7 F (36.5 C) 97.6 F (36.4 C) 98.3 F (36.8 C) (!) 97.5 F (36.4 C)  TempSrc:      SpO2: 95% 94% 95% 100%  Weight:      Height:        PHYSICAL EXAM:  ABD soft Sensation intact distally Dorsiflexion/Plantar flexion intact Incision: scant drainage No cellulitis present Compartment soft  LABS  Results for orders placed or performed during the hospital encounter of 07/09/21 (from the past 24 hour(s))  Glucose, capillary     Status: Abnormal   Collection Time: 07/12/21 12:22 PM  Result Value Ref Range   Glucose-Capillary 115 (H) 70 - 99 mg/dL  CBC     Status: Abnormal   Collection Time: 07/13/21  4:29 AM  Result Value Ref Range   WBC 15.0 (H) 4.0 - 10.5 K/uL   RBC 2.89 (L) 3.87 - 5.11 MIL/uL   Hemoglobin 9.5 (L) 12.0 - 15.0 g/dL   HCT 28.6 (L) 36.0 - 46.0 %   MCV 99.0 80.0 - 100.0 fL   MCH 32.9 26.0 - 34.0 pg   MCHC 33.2 30.0 - 36.0 g/dL   RDW 11.9 11.5 - 15.5 %   Platelets 224 150 - 400 K/uL   nRBC 0.0 0.0 - 0.2 %  Basic metabolic panel     Status: Abnormal   Collection Time: 07/13/21  4:29 AM  Result Value Ref Range   Sodium 134 (L) 135 - 145 mmol/L   Potassium 4.8 3.5 - 5.1 mmol/L   Chloride 99 98 - 111 mmol/L   CO2 28 22 - 32 mmol/L   Glucose, Bld 114 (H) 70 - 99 mg/dL   BUN 63 (H) 8 - 23 mg/dL   Creatinine, Ser 1.38 (H) 0.44 - 1.00 mg/dL   Calcium 8.7 (L) 8.9 - 10.3 mg/dL   GFR, Estimated 37 (L) >60 mL/min   Anion gap 7 5 - 15  Glucose, capillary     Status: Abnormal   Collection Time: 07/13/21 10:29 AM  Result Value Ref Range   Glucose-Capillary 150 (H) 70 - 99 mg/dL    No results found.  Assessment/Plan: 3 Days Post-Op   Principal Problem:   Hip fracture (HCC) Active Problems:   BP  (high blood pressure)   Adult hypothyroidism   HLD (hyperlipidemia)   Stage 3b chronic kidney disease ([place])   Lymphedema of leg   Up with therapy May qualify for home with home health if she continues to improve, she wants to go home Lovenox for DVT RTC 2 weeks Dr. Sharlet Salina for staple removal WBAT with posterior hip precautions   Lovell Sheehan , MD 07/13/2021, 11:48 AM

## 2021-07-14 ENCOUNTER — Other Ambulatory Visit: Payer: Self-pay | Admitting: Family Medicine

## 2021-07-14 DIAGNOSIS — I150 Renovascular hypertension: Secondary | ICD-10-CM | POA: Diagnosis not present

## 2021-07-14 DIAGNOSIS — E039 Hypothyroidism, unspecified: Secondary | ICD-10-CM | POA: Diagnosis not present

## 2021-07-14 DIAGNOSIS — E782 Mixed hyperlipidemia: Secondary | ICD-10-CM | POA: Diagnosis not present

## 2021-07-14 DIAGNOSIS — S72002A Fracture of unspecified part of neck of left femur, initial encounter for closed fracture: Secondary | ICD-10-CM | POA: Diagnosis not present

## 2021-07-14 LAB — BASIC METABOLIC PANEL
Anion gap: 7 (ref 5–15)
BUN: 55 mg/dL — ABNORMAL HIGH (ref 8–23)
CO2: 30 mmol/L (ref 22–32)
Calcium: 8.8 mg/dL — ABNORMAL LOW (ref 8.9–10.3)
Chloride: 102 mmol/L (ref 98–111)
Creatinine, Ser: 1.26 mg/dL — ABNORMAL HIGH (ref 0.44–1.00)
GFR, Estimated: 41 mL/min — ABNORMAL LOW (ref >60)
Glucose, Bld: 113 mg/dL — ABNORMAL HIGH (ref 70–99)
Potassium: 4.2 mmol/L (ref 3.5–5.1)
Sodium: 139 mmol/L (ref 135–145)

## 2021-07-14 LAB — CBC
HCT: 28.8 % — ABNORMAL LOW (ref 36.0–46.0)
Hemoglobin: 9.5 g/dL — ABNORMAL LOW (ref 12.0–15.0)
MCH: 32.6 pg (ref 26.0–34.0)
MCHC: 33 g/dL (ref 30.0–36.0)
MCV: 99 fL (ref 80.0–100.0)
Platelets: 243 10*3/uL (ref 150–400)
RBC: 2.91 MIL/uL — ABNORMAL LOW (ref 3.87–5.11)
RDW: 12.1 % (ref 11.5–15.5)
WBC: 9.8 10*3/uL (ref 4.0–10.5)
nRBC: 0 % (ref 0.0–0.2)

## 2021-07-14 LAB — SURGICAL PATHOLOGY

## 2021-07-14 LAB — GLUCOSE, CAPILLARY: Glucose-Capillary: 99 mg/dL (ref 70–99)

## 2021-07-14 MED ORDER — HYDROCODONE-ACETAMINOPHEN 7.5-325 MG PO TABS: 1.0000 | ORAL_TABLET | Freq: Four times a day (QID) | ORAL | 0 refills | Status: DC | PRN

## 2021-07-14 MED ORDER — ADULT MULTIVITAMIN W/MINERALS CH
ORAL_TABLET | ORAL | Status: AC
  Filled 2021-07-14: qty 1

## 2021-07-14 MED ORDER — METOPROLOL TARTRATE 25 MG PO TABS: 25.0000 mg | ORAL_TABLET | Freq: Two times a day (BID) | ORAL | 0 refills | Status: DC

## 2021-07-14 MED ORDER — ADULT MULTIVITAMIN W/MINERALS CH: 1.0000 | ORAL_TABLET | Freq: Every day | ORAL | 0 refills | Status: DC

## 2021-07-14 MED ORDER — BENAZEPRIL HCL 10 MG PO TABS: 10.0000 mg | ORAL_TABLET | Freq: Every day | ORAL | 0 refills | Status: DC

## 2021-07-14 MED ORDER — ENOXAPARIN SODIUM 40 MG/0.4ML IJ SOSY: 40.0000 mg | PREFILLED_SYRINGE | INTRAMUSCULAR | 0 refills | Status: DC

## 2021-07-14 MED ORDER — HEPARIN SODIUM (PORCINE) 5000 UNIT/ML IJ SOLN: 5000.0000 [IU] | Freq: Two times a day (BID) | INTRAMUSCULAR | 0 refills | Status: DC

## 2021-07-14 MED ORDER — GABAPENTIN 300 MG PO CAPS: 600.0000 mg | ORAL_CAPSULE | Freq: Every day | ORAL | 0 refills | Status: DC

## 2021-07-14 MED ORDER — ENSURE ENLIVE PO LIQD: 237.0000 mL | Freq: Three times a day (TID) | ORAL | 12 refills | Status: DC

## 2021-07-14 NOTE — Progress Notes (Unsigned)
Out patient pharmacy called; Orders on Heprin was not clear and they did not have it.  Notes were reviewed it looks like Ortho was recommending Lovenox   Which I changed it from Heparin to 40 mg Lovenox SQ Q daily for 14 days.    SIGNED: Deatra James, MD, FHM. [facility],  Pager (please use [email or web address] to page/text)  Please use Epic Secure Chat for non-urgent communication (7AM-7PM) If 7PM-7AM, please contact night-coverage [email or web address],  07/14/2021, 2:46 PM

## 2021-07-14 NOTE — Discharge Summary (Signed)
Physician Discharge Summary   Patient: Theresa Snyder MRN: [id number] DOB: 12/02/1929  Admit date:     07/09/2021  Discharge date: 07/14/21  Discharge Physician: Karie Kirks   PCP: [facility] [place]   Recommendations at discharge:    Discharge to SNF The patient is WBAT on the LLE with posterior hip precautions. Follow up with PCP in 7-10 days  Please check CBC and BMP at that visit. Results to be reported to PCP. Follow up with orthopedic surgery in two weeks.  Discharge Diagnoses: Principal Problem:   Hip fracture ([place]) Active Problems:   BP (high blood pressure)   Adult hypothyroidism   HLD (hyperlipidemia)   Stage 3b chronic kidney disease (HCC)   Lymphedema of leg   Hospital Course:  Theresa Snyder is a 88 y.o. female with medical history significant of chronic kidney disease stage III/IV, lymphedema, hypertension, hypothyroidism, hyperlipidemia, history of seizure no longer on Keppra, history of large hiatal hernia, ambulatory dysfunction.   This patient presents from home after having a mechanical fall.  She was trying to get of bed and lost her balance.  She fell on her left side and presented to the emergency department with left hip pain and left shoulder pain.  Left shoulder plain film imaging is negative.  Imaging showed a left femoral neck fracture.  ED provider discussed with Dr. Sharlet Salina.  The plan is for left hemiarthroplasty.  Will likely be done tomorrow.  Denies any chest pain.  Denies any shortness of breath.  Denies any urinary complaints.  No cough. No fevers.  Asking for a GI soft diet.  Daughter present at bedside.  The patient lives alone at home.  Pain is well controlled with medications given in the ER.   The patient was admitted to a telemetry bed. Her potassium was 6 this am. It was treated with Lokelma 10 mg once.   Orthopedic surgery was consulted. The patient was taken for left hemiarthroplasty on 07/10/2021. She has tolerated the  procedure well.   She is working with PT . They have recommended SNF placement  Assessment and Plan: Displaced fracture of the left hip: Orthopedic surgery was consulted. The patient went for ORIF on 07/10/2021. She tolerated the procedure well. She has been evaluated by PT. Recommendation is for discharge to SNF.    Hyperkalemia: Resolved. Due to CKD 3b. Monitor. Lokelma as needed to correct.   Stage 3b CKD:  Baseline creatinine is 1.4 - 1.8. She presented with creatinine of 2.01. Creatinine improved to 1.85 today.   Acute on chronic anemia: Chronic anemia due to ESRD with acute post-op anemia   Hypothyroidism: Continue synthroid 88 mcg daily as at home.   Leukocytosis: Likely due to recent surgery   Hypertension: The patient is normotensive although she has not had her hydrochlorothiazide, amlodipine, or benazepril since yesterday. Restart postoperatively as warranted. Will hold benazepril given patient's elevated creatinine and hyperkalemia.   Hyperlipidemia: Continue statin as at home.   Consultants: Orthopedic surgery Procedures performed: ORIF of the Left hip  Disposition: Skilled nursing facility Diet recommendation:  Discharge Diet Orders (From admission, onward)     Start     Ordered   07/14/21 0000  Diet - low sodium heart healthy        07/14/21 1354           Cardiac diet DISCHARGE MEDICATION: Allergies as of 07/14/2021       Reactions   Iodinated Contrast Media Other (See Comments)  Blue, Green and Red dyes    Lorazepam    Altered mental state   Risperidone Other (See Comments)   Altered mental state    Sulfamethoxazole-trimethoprim    Hyper        Medication List     STOP taking these medications    HYDROcodone-acetaminophen 10-325 MG tablet Commonly known as: NORCO Replaced by: HYDROcodone-acetaminophen 7.5-325 MG tablet       TAKE these medications    amLODipine 10 MG tablet Commonly known as: NORVASC Take 10 mg by mouth daily with  breakfast. for high blood pressure   benazepril 10 MG tablet Commonly known as: LOTENSIN Take 1 tablet (10 mg total) by mouth daily. Start taking on: July 15, 2021 What changed:  medication strength how much to take   docusate sodium 100 MG capsule Commonly known as: COLACE Take 1 capsule (100 mg total) by mouth 2 (two) times daily as needed for mild constipation.   feeding supplement Liqd Take 237 mLs by mouth 3 (three) times daily between meals.   gabapentin 300 MG capsule Commonly known as: NEURONTIN Take 2 capsules (600 mg total) by mouth at bedtime. What changed:  how much to take when to take this   heparin 5000 UNIT/ML injection Inject 1 mL (5,000 Units total) into the skin every 12 (twelve) hours for 14 days.   hydrochlorothiazide 25 MG tablet Commonly known as: HYDRODIURIL Take 25 mg by mouth daily.   HYDROcodone-acetaminophen 7.5-325 MG tablet Commonly known as: NORCO Take 1-2 tablets by mouth every 6 (six) hours as needed for severe pain (pain score 7-10). Replaces: HYDROcodone-acetaminophen 10-325 MG tablet   levothyroxine 88 MCG tablet Commonly known as: SYNTHROID Take 88 mcg by mouth daily before breakfast.   metoprolol tartrate 25 MG tablet Commonly known as: LOPRESSOR Take 1 tablet (25 mg total) by mouth 2 (two) times daily.   multivitamin with minerals Tabs tablet Take 1 tablet by mouth daily. Start taking on: July 15, 2021   pravastatin 20 MG tablet Commonly known as: PRAVACHOL Take 20 mg by mouth at bedtime.               Discharge Care Instructions  (From admission, onward)           Start     Ordered   07/14/21 0000  Discharge wound care:       Comments: As per orthopedic surgery   07/14/21 1408              Contact information for after-discharge care     [facility] .   Service: Skilled Nursing Contact information: [place]  [place] [phone]                    Discharge Exam: Danley Danker Weights   07/09/21 1307 07/09/21 1727  Weight: 54.4 kg 58 kg   Vitals:   07/14/21 0738 07/14/21 1135  BP: 130/65 118/68  Pulse: 72 80  Resp: 18 18  Temp: 98 F (36.7 C) 97.9 F (36.6 C)  SpO2: 99% 96%   Exam:  Constitutional:  The patient is awake, alert, and oriented x 3. No acute distress. Respiratory:  No increased work of breathing. No wheezes, rales, or rhonchi No tactile fremitus Cardiovascular:  Regular rate and rhythm No murmurs, ectopy, or gallups. No lateral PMI. No thrills. Abdomen:  Abdomen is soft, non-tender, non-distended No hernias, masses, or organomegaly Normoactive bowel sounds.  Musculoskeletal:  No cyanosis, clubbing, or edema Skin:  No rashes, lesions, ulcers palpation of skin: no induration or nodules Neurologic:  CN 2-12 intact Sensation all 4 extremities intact Psychiatric:  Mental status Mood, affect appropriate Orientation to person, place, time  judgment and insight appear intact   Condition at discharge: fair  The results of significant diagnostics from this hospitalization (including imaging, microbiology, ancillary and laboratory) are listed below for reference.   Imaging Studies: DG Chest 1 View  Result Date: 07/09/2021 CLINICAL DATA:  Fall.  Left shoulder pain.  Hypotension. EXAM: CHEST  1 VIEW COMPARISON:  07/09/2020.  Abdominopelvic CT 05/21/2021 FINDINGS: Midline trachea. Borderline cardiomegaly. Atherosclerosis in the transverse aorta. Large hiatal hernia, progressive compared to the prior plain film. No right-sided pleural effusion. The hernia degrades evaluation of the inferior left hemithorax. No pneumothorax. Given above limitations, lungs are clear. Deformities of superolateral right ribs are at least partially remote, present on the prior. IMPRESSION: No acute cardiopulmonary disease. Progression of a large hiatal hernia, limiting evaluation of the  inferior left hemithorax. Aortic Atherosclerosis (ICD10-I70.0). Electronically Signed   By: Abigail Miyamoto M.D.   On: 07/09/2021 14:20   Pelvis Portable  Result Date: 07/10/2021 CLINICAL DATA:  Postop. EXAM: PORTABLE PELVIS 1-2 VIEWS COMPARISON:  Preoperative radiograph yesterday. FINDINGS: Left hip arthroplasty in expected alignment. No periprosthetic lucency. Recent postsurgical change includes air and edema in the joint space and soft tissues. Lateral skin staples in place. IMPRESSION: Left hip arthroplasty without immediate postoperative complication. Electronically Signed   By: Keith Rake M.D.   On: 07/10/2021 19:17   DG Shoulder Left  Result Date: 07/09/2021 CLINICAL DATA:  Fall, left shoulder pain EXAM: LEFT SHOULDER - 2+ VIEW COMPARISON:  None Available. FINDINGS: There is no acute fracture or dislocation. Glenohumeral and acromioclavicular alignment is maintained though the humeral head is high-riding suggesting rotator cuff pathology. There is moderate narrowing of the glenohumeral joint with associated subchondral sclerosis. The soft tissues are unremarkable. IMPRESSION: No acute fracture or dislocation. Electronically Signed   By: Valetta Mole M.D.   On: 07/09/2021 14:21   DG Hip Unilat With Pelvis 2-3 Views Left  Result Date: 07/09/2021 CLINICAL DATA:  Fall, pain, initial encounter. EXAM: DG HIP (WITH OR WITHOUT PELVIS) 2-3V LEFT COMPARISON:  None Available. FINDINGS: Left femoral neck fracture with superior displacement of the distal fracture fragment. Superomedial joint space narrowing in the left hip with mild sclerosis along the acetabulum. IMPRESSION: Left femoral neck fracture. Electronically Signed   By: Lorin Picket M.D.   On: 07/09/2021 14:17    Microbiology: Results for orders placed or performed during the hospital encounter of 07/09/21  Surgical PCR screen     Status: Abnormal   Collection Time: 07/09/21  8:32 PM   Specimen: Nasal Mucosa; Nasal Swab  Result Value Ref  Range Status   MRSA, PCR NEGATIVE NEGATIVE Final   Staphylococcus aureus POSITIVE (A) NEGATIVE Final    Comment: (NOTE) The Xpert SA Assay (FDA approved for NASAL specimens in patients 65 years of age and older), is one component of a comprehensive surveillance program. It is not intended to diagnose infection nor to guide or monitor treatment. Performed at [facility], [address] [place], [place] [address]     Labs: CBC: Recent Labs  Lab 07/09/21 1320 07/10/21 0418 07/11/21 0351 07/12/21 0445 07/13/21 0429 07/14/21 0609  WBC 21.0* 14.3* 9.0 14.8* 15.0* 9.8  NEUTROABS 18.9*  --  7.9*  --   --   --  HGB 12.0 9.9* 8.8* 8.4* 9.5* 9.5*  HCT 37.2 30.4* 26.2* 25.2* 28.6* 28.8*  MCV 101.6* 102.0* 100.8* 99.2 99.0 99.0  PLT 223 182 152 167 224 546   Basic Metabolic Panel: Recent Labs  Lab 07/11/21 0351 07/11/21 1512 07/12/21 0445 07/13/21 0429 07/14/21 0609  NA 136 132* 134* 134* 139  K 5.4* 4.6 4.7 4.8 4.2  CL 106 100 100 99 102  CO2 '25 25 26 28 30  '$ GLUCOSE 192* 153* 124* 114* 113*  BUN 52* 53* 57* 63* 55*  CREATININE 1.78* 1.82* 1.85* 1.38* 1.26*  CALCIUM 8.6* 8.4* 8.7* 8.7* 8.8*  MG  --   --  1.8  --   --    Liver Function Tests: Recent Labs  Lab 07/09/21 1320 07/10/21 0418 07/11/21 0351  AST 52* 32 32  ALT '22 18 17  '$ ALKPHOS 59 44 41  BILITOT 1.1 0.9 0.5  PROT 7.2 5.6* 5.5*  ALBUMIN 4.3 3.2* 3.0*   CBG: Recent Labs  Lab 07/10/21 1154 07/11/21 0827 07/12/21 1222 07/13/21 1029 07/14/21 0739  GLUCAP 103* 171* 115* 150* 99    Discharge time spent: greater than 30 minutes.  Signed: Chelbie Jarnagin, DO [facility] 07/14/2021

## 2021-07-14 NOTE — Plan of Care (Signed)
Problem: Education: Goal: Expressions of having a comfortable level of knowledge regarding the disease process will increase 07/14/2021 1450 by Gwendel Hanson, LPN Outcome: Adequate for Discharge 07/14/2021 1449 by Gwendel Hanson, LPN Outcome: Progressing   Problem: Coping: Goal: Ability to adjust to condition or change in health will improve 07/14/2021 1450 by Gwendel Hanson, LPN Outcome: Adequate for Discharge 07/14/2021 1449 by Gwendel Hanson, LPN Outcome: Progressing Goal: Ability to identify appropriate support needs will improve 07/14/2021 1450 by Gwendel Hanson, LPN Outcome: Adequate for Discharge 07/14/2021 1449 by Gwendel Hanson, LPN Outcome: Progressing   Problem: Health Behavior/Discharge Planning: Goal: Compliance with prescribed medication regimen will improve 07/14/2021 1450 by Gwendel Hanson, LPN Outcome: Adequate for Discharge 07/14/2021 1449 by Gwendel Hanson, LPN Outcome: Progressing   Problem: Medication: Goal: Risk for medication side effects will decrease 07/14/2021 1450 by Gwendel Hanson, LPN Outcome: Adequate for Discharge 07/14/2021 1449 by Gwendel Hanson, LPN Outcome: Progressing   Problem: Clinical Measurements: Goal: Complications related to the disease process, condition or treatment will be avoided or minimized 07/14/2021 1450 by Gwendel Hanson, LPN Outcome: Adequate for Discharge 07/14/2021 1449 by Gwendel Hanson, LPN Outcome: Progressing Goal: Diagnostic test results will improve 07/14/2021 1450 by Gwendel Hanson, LPN Outcome: Adequate for Discharge 07/14/2021 1449 by Gwendel Hanson, LPN Outcome: Progressing   Problem: Safety: Goal: Verbalization of understanding the information provided will improve 07/14/2021 1450 by Gwendel Hanson, LPN Outcome: Adequate for Discharge 07/14/2021 1449 by Gwendel Hanson, LPN Outcome: Progressing   Problem: Self-Concept: Goal: Level of anxiety will decrease 07/14/2021 1450 by Gwendel Hanson, LPN Outcome: Adequate for  Discharge 07/14/2021 1449 by Gwendel Hanson, LPN Outcome: Progressing Goal: Ability to verbalize feelings about condition will improve 07/14/2021 1450 by Gwendel Hanson, LPN Outcome: Adequate for Discharge 07/14/2021 1449 by Gwendel Hanson, LPN Outcome: Progressing   Problem: Education: Goal: Knowledge of General Education information will improve Description: Including pain rating scale, medication(s)/side effects and non-pharmacologic comfort measures 07/14/2021 1450 by Gwendel Hanson, LPN Outcome: Adequate for Discharge 07/14/2021 1449 by Gwendel Hanson, LPN Outcome: Progressing   Problem: Health Behavior/Discharge Planning: Goal: Ability to manage health-related needs will improve 07/14/2021 1450 by Gwendel Hanson, LPN Outcome: Adequate for Discharge 07/14/2021 1449 by Gwendel Hanson, LPN Outcome: Progressing   Problem: Clinical Measurements: Goal: Ability to maintain clinical measurements within normal limits will improve 07/14/2021 1450 by Gwendel Hanson, LPN Outcome: Adequate for Discharge 07/14/2021 1449 by Gwendel Hanson, LPN Outcome: Progressing Goal: Will remain free from infection 07/14/2021 1450 by Gwendel Hanson, LPN Outcome: Adequate for Discharge 07/14/2021 1449 by Gwendel Hanson, LPN Outcome: Progressing Goal: Diagnostic test results will improve 07/14/2021 1450 by Gwendel Hanson, LPN Outcome: Adequate for Discharge 07/14/2021 1449 by Gwendel Hanson, LPN Outcome: Progressing Goal: Respiratory complications will improve 07/14/2021 1450 by Gwendel Hanson, LPN Outcome: Adequate for Discharge 07/14/2021 1449 by Gwendel Hanson, LPN Outcome: Progressing Goal: Cardiovascular complication will be avoided 07/14/2021 1450 by Gwendel Hanson, LPN Outcome: Adequate for Discharge 07/14/2021 1449 by Gwendel Hanson, LPN Outcome: Progressing   Problem: Activity: Goal: Risk for activity intolerance will decrease 07/14/2021 1450 by Gwendel Hanson, LPN Outcome: Adequate for Discharge 07/14/2021 1449 by  Gwendel Hanson, LPN Outcome: Progressing   Problem: Nutrition: Goal: Adequate nutrition will be maintained 07/14/2021 1450 by Gwendel Hanson, LPN Outcome: Adequate for Discharge 07/14/2021 1449 by Madelyn Flavors,  Chalmers Cater, LPN Outcome: Progressing   Problem: Coping: Goal: Level of anxiety will decrease 07/14/2021 1450 by Gwendel Hanson, LPN Outcome: Adequate for Discharge 07/14/2021 1449 by Gwendel Hanson, LPN Outcome: Progressing   Problem: Elimination: Goal: Will not experience complications related to bowel motility 07/14/2021 1450 by Gwendel Hanson, LPN Outcome: Adequate for Discharge 07/14/2021 1449 by Gwendel Hanson, LPN Outcome: Progressing Goal: Will not experience complications related to urinary retention 07/14/2021 1450 by Gwendel Hanson, LPN Outcome: Adequate for Discharge 07/14/2021 1449 by Gwendel Hanson, LPN Outcome: Progressing   Problem: Pain Managment: Goal: General experience of comfort will improve 07/14/2021 1450 by Gwendel Hanson, LPN Outcome: Adequate for Discharge 07/14/2021 1449 by Gwendel Hanson, LPN Outcome: Progressing   Problem: Safety: Goal: Ability to remain free from injury will improve 07/14/2021 1450 by Gwendel Hanson, LPN Outcome: Adequate for Discharge 07/14/2021 1449 by Gwendel Hanson, LPN Outcome: Progressing   Problem: Skin Integrity: Goal: Risk for impaired skin integrity will decrease 07/14/2021 1450 by Gwendel Hanson, LPN Outcome: Adequate for Discharge 07/14/2021 1449 by Gwendel Hanson, LPN Outcome: Progressing

## 2021-07-14 NOTE — Care Management Important Message (Signed)
Important Message  Patient Details  Name: Theresa Snyder MRN: [id number] Date of Birth: 12/12/1929   Medicare Important Message Given:  Yes     Juliann Pulse A Lux Skilton 07/14/2021, 1:13 PM

## 2021-07-14 NOTE — Plan of Care (Signed)

## 2021-07-14 NOTE — TOC Transition Note (Addendum)
Transition of Care [facility]) - CM/SW Discharge Note   Patient Details  Name: Theresa Snyder MRN: [id number] Date of Birth: 1930-02-03  [facility] of [facility] CM/SW Contact:  Eileen Stanford, LCSW Phone Number: 07/14/2021, 2:11 PM   Clinical Narrative:   Clinical Social Worker facilitated patient discharge including contacting patient family and facility to confirm patient discharge plans.  Clinical information faxed to facility and family agreeable with plan.  CSW arranged ambulance transport via [facility] to [facility] (room 67B) .  RN to call [phone] for report prior to discharge.  Pt states she is going to call daughter and let her know.  Final next level of care: Skilled Nursing Facility Barriers to Discharge: No Barriers Identified   Patient Goals and CMS Choice        Discharge Placement              Patient chooses bed at:  [facility]) Patient to be transferred to facility by: [facility]   Patient and family notified of of transfer: 07/14/21  Discharge Plan and Services   Discharge Planning Services: CM Consult                                 Social Determinants of Health (SDOH) Interventions     Readmission Risk Interventions     View : No data to display.

## 2021-07-14 NOTE — TOC Progression Note (Signed)
Transition of Care [facility]) - Progression Note    Patient Details  Name: Theresa Snyder MRN: [id number] Date of Birth: May 18, 1929  Transition of Care [facility]) CM/SW Contact  Eileen Stanford, LCSW Phone Number: 07/14/2021, 11:45 AM  Clinical Narrative:  Pt prefers [facility] and they can take pt today. MD aware.      Expected Discharge Plan: [place] Barriers to Discharge: Continued Medical Work up, Ship broker, SNF Pending bed offer  Expected Discharge Plan and Services Expected Discharge Plan: [place]   Discharge Planning Services: CM Consult   Living arrangements for the past 2 months: Single Family Home                                       Social Determinants of Health (SDOH) Interventions    Readmission Risk Interventions     View : No data to display.

## 2021-07-14 NOTE — Progress Notes (Signed)
Physical Therapy Treatment Patient Details Name: Theresa Snyder MRN: [id number] DOB: [birth year] Today's Date: 07/14/2021   History of Present Illness The pt is a 88 yo female s/p L hemiarthroplasty due to a hip fracture. PMH of HTN, aspiration PNA 2/2 hiatal hernia and opiod use in April, seizues, CKD III. Pt also noted to have elevated K+, cleared by MD for therapy after lokelma to address this.    PT Comments    Pt ready for session.  In chair stating she is wet and uncomfortable.  Stood for care and she is able to walk 12' to and from door with seated rest in visitor chair placed outside of door.  She does have an increased limp today and reports pain 10/10,  LPN obtained pain meds and were given to pt upon return to room.  Son in and pt in chair with needs met.    Recommendations for follow up therapy are one component of a multi-disciplinary discharge planning process, led by the attending physician.  Recommendations may be updated based on patient status, additional functional criteria and insurance authorization.  Follow Up Recommendations  Skilled nursing-short term rehab (<3 hours/day)     Assistance Recommended at Discharge Frequent or constant Supervision/Assistance  Patient can return home with the following Help with stairs or ramp for entrance;Assist for transportation;Assistance with cooking/housework;Direct supervision/assist for financial management;A lot of help with walking and/or transfers;A lot of help with bathing/dressing/bathroom   Equipment Recommendations  Other (comment) (TBD at next venue of care)    Recommendations for Other Services       Precautions / Restrictions Precautions Precautions: Fall;Posterior Hip Restrictions Weight Bearing Restrictions: Yes LLE Weight Bearing: Weight bearing as tolerated     Mobility  Bed Mobility               General bed mobility comments: in chair before and after    Transfers Overall transfer level: Needs  assistance Equipment used: Rolling walker (2 wheels) Transfers: Sit to/from Stand Sit to Stand: Min guard                Ambulation/Gait Ambulation/Gait assistance: Min Web designer (Feet): 12 Feet Assistive device: Rolling walker (2 wheels) Gait Pattern/deviations: Decreased stance time - left, Decreased stride length, Step-to pattern Gait velocity: decreased     General Gait Details: to/from door with seated rest in chair outside of door   Stairs             Wheelchair Mobility    Modified Rankin (Stroke Patients Only)       Balance Overall balance assessment: Needs assistance Sitting-balance support: Feet supported Sitting balance-Leahy Scale: Fair     Standing balance support: Reliant on assistive device for balance Standing balance-Leahy Scale: Fair                              Cognition Arousal/Alertness: Awake/alert Behavior During Therapy: WFL for tasks assessed/performed Overall Cognitive Status: Within Functional Limits for tasks assessed                                          Exercises      General Comments        Pertinent Vitals/Pain Pain Assessment Pain Assessment: 0-10 Pain Score: 10-Worst pain ever Pain Location: L hip Pain Descriptors / Indicators: Aching, Guarding, Grimacing,  Moaning Pain Intervention(s): Patient requesting pain meds-RN notified, Repositioned, Monitored during session    Home Living                          Prior Function            PT Goals (current goals can now be found in the care plan section) Progress towards PT goals: Progressing toward goals    Frequency    7X/week      PT Plan Current plan remains appropriate    Co-evaluation              AM-PAC PT "6 Clicks" Mobility   Outcome Measure  Help needed turning from your back to your side while in a flat bed without using bedrails?: A Lot Help needed moving from lying on your  back to sitting on the side of a flat bed without using bedrails?: A Lot Help needed moving to and from a bed to a chair (including a wheelchair)?: A Little Help needed standing up from a chair using your arms (e.g., wheelchair or bedside chair)?: A Little Help needed to walk in hospital room?: A Little Help needed climbing 3-5 steps with a railing? : Total 6 Click Score: 14    End of Session Equipment Utilized During Treatment: Gait belt Activity Tolerance: Patient limited by fatigue;Patient limited by pain Patient left: with call bell/phone within reach;in bed;with bed alarm set Nurse Communication: Mobility status PT Visit Diagnosis: Other abnormalities of gait and mobility (R26.89);Difficulty in walking, not elsewhere classified (R26.2);Muscle weakness (generalized) (M62.81)     Time: 1012-1027 PT Time Calculation (min) (ACUTE ONLY): 15 min  Charges:  $Gait Training: 8-22 mins                   Chesley Noon, PTA 07/14/21, 10:52 AM

## 2021-07-14 NOTE — Progress Notes (Signed)
  Subjective:  Patient reports pain is relatively well controlled.  Patient is sitting in bedside chair.  Objective:   VITALS:   Vitals:   07/13/21 2038 07/14/21 0533 07/14/21 0738 07/14/21 1135  BP: (!) 141/57 133/62 130/65 118/68  Pulse: (!) 102 71 72 80  Resp: '20 18 18 18  '$ Temp: 98 F (36.7 C) 97.8 F (36.6 C) 98 F (36.7 C) 97.9 F (36.6 C)  TempSrc:      SpO2: 98% 99% 99% 96%  Weight:      Height:        PHYSICAL EXAM: General: alert, comfortable Left lower extremity: dressing with scant drainage.  Left lower extremity is neurovascular intact  LABS  Results for orders placed or performed during the hospital encounter of 07/09/21 (from the past 24 hour(s))  CBC     Status: Abnormal   Collection Time: 07/14/21  6:09 AM  Result Value Ref Range   WBC 9.8 4.0 - 10.5 K/uL   RBC 2.91 (L) 3.87 - 5.11 MIL/uL   Hemoglobin 9.5 (L) 12.0 - 15.0 g/dL   HCT 28.8 (L) 36.0 - 46.0 %   MCV 99.0 80.0 - 100.0 fL   MCH 32.6 26.0 - 34.0 pg   MCHC 33.0 30.0 - 36.0 g/dL   RDW 12.1 11.5 - 15.5 %   Platelets 243 150 - 400 K/uL   nRBC 0.0 0.0 - 0.2 %  Basic metabolic panel     Status: Abnormal   Collection Time: 07/14/21  6:09 AM  Result Value Ref Range   Sodium 139 135 - 145 mmol/L   Potassium 4.2 3.5 - 5.1 mmol/L   Chloride 102 98 - 111 mmol/L   CO2 30 22 - 32 mmol/L   Glucose, Bld 113 (H) 70 - 99 mg/dL   BUN 55 (H) 8 - 23 mg/dL   Creatinine, Ser 1.26 (H) 0.44 - 1.00 mg/dL   Calcium 8.8 (L) 8.9 - 10.3 mg/dL   GFR, Estimated 41 (L) >60 mL/min   Anion gap 7 5 - 15  Glucose, capillary     Status: None   Collection Time: 07/14/21  7:39 AM  Result Value Ref Range   Glucose-Capillary 99 70 - 99 mg/dL    No results found.  Assessment/Plan: 4 Days Post-Op  S/p Left hip hemiarthroplasty - Appreciate hospitalist team support - PT/OT: WBAT LLE, posterior hip precautions - DVT ppx: Lovenox x 14 days - Dispo per PT/case manager: current recommendation [facility] [facility] -  Follow-up in clinic in 2 weeks for xrays and staple removal   Renee Harder , MD 07/14/2021, 1:10 PM

## 2021-08-12 ENCOUNTER — Inpatient Hospital Stay: Payer: Medicare Other

## 2021-08-12 ENCOUNTER — Emergency Department: Payer: Medicare Other

## 2021-08-12 ENCOUNTER — Inpatient Hospital Stay
Admission: EM | Admit: 2021-08-12 | Discharge: 2021-08-15 | DRG: 193 | Disposition: A | Discharge status: Skilled Nursing Facility | Payer: Medicare Other | Source: Skilled Nursing Facility | Attending: Internal Medicine | Admitting: Internal Medicine

## 2021-08-12 ENCOUNTER — Other Ambulatory Visit: Payer: Self-pay

## 2021-08-12 DIAGNOSIS — R0600 Dyspnea, unspecified: Secondary | ICD-10-CM

## 2021-08-12 DIAGNOSIS — G629 Polyneuropathy, unspecified: Secondary | ICD-10-CM | POA: Diagnosis present

## 2021-08-12 DIAGNOSIS — M810 Age-related osteoporosis without current pathological fracture: Secondary | ICD-10-CM | POA: Diagnosis present

## 2021-08-12 DIAGNOSIS — D72829 Elevated white blood cell count, unspecified: Secondary | ICD-10-CM

## 2021-08-12 DIAGNOSIS — I5033 Acute on chronic diastolic (congestive) heart failure: Secondary | ICD-10-CM | POA: Diagnosis present

## 2021-08-12 DIAGNOSIS — M792 Neuralgia and neuritis, unspecified: Secondary | ICD-10-CM | POA: Diagnosis not present

## 2021-08-12 DIAGNOSIS — J189 Pneumonia, unspecified organism: Secondary | ICD-10-CM | POA: Diagnosis present

## 2021-08-12 DIAGNOSIS — J440 Chronic obstructive pulmonary disease with acute lower respiratory infection: Secondary | ICD-10-CM | POA: Diagnosis present

## 2021-08-12 DIAGNOSIS — Z91041 Radiographic dye allergy status: Secondary | ICD-10-CM | POA: Diagnosis not present

## 2021-08-12 DIAGNOSIS — I509 Heart failure, unspecified: Principal | ICD-10-CM

## 2021-08-12 DIAGNOSIS — I130 Hypertensive heart and chronic kidney disease with heart failure and stage 1 through stage 4 chronic kidney disease, or unspecified chronic kidney disease: Secondary | ICD-10-CM | POA: Diagnosis present

## 2021-08-12 DIAGNOSIS — N1832 Chronic kidney disease, stage 3b: Secondary | ICD-10-CM | POA: Diagnosis present

## 2021-08-12 DIAGNOSIS — Z79890 Hormone replacement therapy (postmenopausal): Secondary | ICD-10-CM

## 2021-08-12 DIAGNOSIS — M48061 Spinal stenosis, lumbar region without neurogenic claudication: Secondary | ICD-10-CM | POA: Diagnosis present

## 2021-08-12 DIAGNOSIS — I878 Other specified disorders of veins: Secondary | ICD-10-CM | POA: Diagnosis present

## 2021-08-12 DIAGNOSIS — E782 Mixed hyperlipidemia: Secondary | ICD-10-CM | POA: Diagnosis not present

## 2021-08-12 DIAGNOSIS — R7989 Other specified abnormal findings of blood chemistry: Secondary | ICD-10-CM

## 2021-08-12 DIAGNOSIS — Z79899 Other long term (current) drug therapy: Secondary | ICD-10-CM | POA: Diagnosis not present

## 2021-08-12 DIAGNOSIS — Z20822 Contact with and (suspected) exposure to covid-19: Secondary | ICD-10-CM | POA: Diagnosis present

## 2021-08-12 DIAGNOSIS — Z888 Allergy status to other drugs, medicaments and biological substances status: Secondary | ICD-10-CM

## 2021-08-12 DIAGNOSIS — E7800 Pure hypercholesterolemia, unspecified: Secondary | ICD-10-CM | POA: Diagnosis present

## 2021-08-12 DIAGNOSIS — E039 Hypothyroidism, unspecified: Secondary | ICD-10-CM | POA: Diagnosis present

## 2021-08-12 DIAGNOSIS — E785 Hyperlipidemia, unspecified: Secondary | ICD-10-CM

## 2021-08-12 LAB — PROTIME-INR
INR: 1.1 (ref 0.8–1.2)
Prothrombin Time: 13.7 seconds (ref 11.4–15.2)

## 2021-08-12 LAB — PROCALCITONIN: Procalcitonin: 0.11 ng/mL

## 2021-08-12 LAB — CBC WITH DIFFERENTIAL/PLATELET
Abs Immature Granulocytes: 0.24 10*3/uL — ABNORMAL HIGH (ref 0.00–0.07)
Basophils Absolute: 0.1 10*3/uL (ref 0.0–0.1)
Basophils Relative: 0 %
Eosinophils Absolute: 0 10*3/uL (ref 0.0–0.5)
Eosinophils Relative: 0 %
HCT: 29.7 % — ABNORMAL LOW (ref 36.0–46.0)
Hemoglobin: 9.5 g/dL — ABNORMAL LOW (ref 12.0–15.0)
Immature Granulocytes: 2 %
Lymphocytes Relative: 12 %
Lymphs Abs: 2 10*3/uL (ref 0.7–4.0)
MCH: 31.4 pg (ref 26.0–34.0)
MCHC: 32 g/dL (ref 30.0–36.0)
MCV: 98 fL (ref 80.0–100.0)
Monocytes Absolute: 1.1 10*3/uL — ABNORMAL HIGH (ref 0.1–1.0)
Monocytes Relative: 7 %
Neutro Abs: 12.9 10*3/uL — ABNORMAL HIGH (ref 1.7–7.7)
Neutrophils Relative %: 79 %
Platelets: 428 10*3/uL — ABNORMAL HIGH (ref 150–400)
RBC: 3.03 MIL/uL — ABNORMAL LOW (ref 3.87–5.11)
RDW: 12.5 % (ref 11.5–15.5)
WBC: 16.3 10*3/uL — ABNORMAL HIGH (ref 4.0–10.5)
nRBC: 0 % (ref 0.0–0.2)

## 2021-08-12 LAB — LACTIC ACID, PLASMA
Lactic Acid, Venous: 1.6 mmol/L (ref 0.5–1.9)
Lactic Acid, Venous: 1.1 mmol/L (ref 0.5–1.9)

## 2021-08-12 LAB — COMPREHENSIVE METABOLIC PANEL
ALT: 12 U/L (ref 0–44)
AST: 20 U/L (ref 15–41)
Albumin: 3.1 g/dL — ABNORMAL LOW (ref 3.5–5.0)
Alkaline Phosphatase: 111 U/L (ref 38–126)
Anion gap: 9 (ref 5–15)
BUN: 31 mg/dL — ABNORMAL HIGH (ref 8–23)
CO2: 24 mmol/L (ref 22–32)
Calcium: 8.9 mg/dL (ref 8.9–10.3)
Chloride: 98 mmol/L (ref 98–111)
Creatinine, Ser: 1.45 mg/dL — ABNORMAL HIGH (ref 0.44–1.00)
GFR, Estimated: 34 mL/min — ABNORMAL LOW (ref >60)
Glucose, Bld: 136 mg/dL — ABNORMAL HIGH (ref 70–99)
Potassium: 4.8 mmol/L (ref 3.5–5.1)
Sodium: 131 mmol/L — ABNORMAL LOW (ref 135–145)
Total Bilirubin: 0.6 mg/dL (ref 0.3–1.2)
Total Protein: 6.9 g/dL (ref 6.5–8.1)

## 2021-08-12 LAB — TROPONIN I (HIGH SENSITIVITY)
Troponin I (High Sensitivity): 13 ng/L (ref <18)
Troponin I (High Sensitivity): 9 ng/L (ref <18)

## 2021-08-12 LAB — BRAIN NATRIURETIC PEPTIDE: B Natriuretic Peptide: 658.8 pg/mL — ABNORMAL HIGH (ref 0.0–100.0)

## 2021-08-12 LAB — SARS CORONAVIRUS 2 BY RT PCR: SARS Coronavirus 2 by RT PCR: NEGATIVE

## 2021-08-12 LAB — APTT: aPTT: 41 seconds — ABNORMAL HIGH (ref 24–36)

## 2021-08-12 LAB — D-DIMER, QUANTITATIVE: D-Dimer, Quant: 1.89 ug/mL-FEU — ABNORMAL HIGH (ref 0.00–0.50)

## 2021-08-12 MED ORDER — METOPROLOL TARTRATE 25 MG PO TABS
25.0000 mg | ORAL_TABLET | Freq: Two times a day (BID) | ORAL | Status: DC
  Administered 2021-08-12 – 2021-08-15 (×4): 25 mg via ORAL
  Filled 2021-08-12 (×6): qty 1

## 2021-08-12 MED ORDER — PRAVASTATIN SODIUM 20 MG PO TABS
20.0000 mg | ORAL_TABLET | Freq: Every day | ORAL | Status: DC
  Administered 2021-08-12 – 2021-08-14 (×3): 20 mg via ORAL
  Filled 2021-08-12 (×3): qty 1

## 2021-08-12 MED ORDER — HYDROCODONE-ACETAMINOPHEN 7.5-325 MG PO TABS: 1.0000 | ORAL_TABLET | Freq: Four times a day (QID) | ORAL | Status: DC | PRN

## 2021-08-12 MED ORDER — ONDANSETRON HCL 4 MG/2ML IJ SOLN: 4.0000 mg | Freq: Four times a day (QID) | INTRAMUSCULAR | Status: DC | PRN

## 2021-08-12 MED ORDER — AMLODIPINE BESYLATE 10 MG PO TABS
10.0000 mg | ORAL_TABLET | Freq: Every day | ORAL | Status: DC
  Administered 2021-08-13 – 2021-08-15 (×3): 10 mg via ORAL
  Filled 2021-08-12: qty 1
  Filled 2021-08-12: qty 2
  Filled 2021-08-12: qty 1

## 2021-08-12 MED ORDER — BENAZEPRIL HCL 10 MG PO TABS
10.0000 mg | ORAL_TABLET | Freq: Every day | ORAL | Status: DC
  Administered 2021-08-13 – 2021-08-15 (×3): 10 mg via ORAL
  Filled 2021-08-12 (×3): qty 1

## 2021-08-12 MED ORDER — ENOXAPARIN SODIUM 30 MG/0.3ML IJ SOSY
30.0000 mg | PREFILLED_SYRINGE | INTRAMUSCULAR | Status: DC
  Administered 2021-08-12 – 2021-08-14 (×3): 30 mg via SUBCUTANEOUS
  Filled 2021-08-12 (×3): qty 0.3

## 2021-08-12 MED ORDER — ADULT MULTIVITAMIN W/MINERALS CH
1.0000 | ORAL_TABLET | Freq: Every day | ORAL | Status: DC
  Administered 2021-08-14 – 2021-08-15 (×2): 1 via ORAL
  Filled 2021-08-12 (×3): qty 1

## 2021-08-12 MED ORDER — HYDROCODONE-ACETAMINOPHEN 7.5-325 MG PO TABS
1.0000 | ORAL_TABLET | Freq: Four times a day (QID) | ORAL | Status: DC | PRN
  Administered 2021-08-13 – 2021-08-15 (×3): 1 via ORAL
  Filled 2021-08-12 (×3): qty 1

## 2021-08-12 MED ORDER — FUROSEMIDE 10 MG/ML IJ SOLN
20.0000 mg | Freq: Two times a day (BID) | INTRAMUSCULAR | Status: DC
  Administered 2021-08-12 – 2021-08-15 (×6): 20 mg via INTRAVENOUS
  Filled 2021-08-12 (×6): qty 4

## 2021-08-12 MED ORDER — ACETAMINOPHEN 325 MG PO TABS: 650.0000 mg | ORAL_TABLET | Freq: Four times a day (QID) | ORAL | Status: DC | PRN

## 2021-08-12 MED ORDER — SENNOSIDES-DOCUSATE SODIUM 8.6-50 MG PO TABS: 1.0000 | ORAL_TABLET | Freq: Every evening | ORAL | Status: DC | PRN

## 2021-08-12 MED ORDER — ACETAMINOPHEN 325 MG RE SUPP: 650.0000 mg | Freq: Four times a day (QID) | RECTAL | Status: DC | PRN

## 2021-08-12 MED ORDER — LEVOTHYROXINE SODIUM 88 MCG PO TABS
88.0000 ug | ORAL_TABLET | Freq: Every day | ORAL | Status: DC
  Administered 2021-08-14 – 2021-08-15 (×2): 88 ug via ORAL
  Filled 2021-08-12 (×3): qty 1

## 2021-08-12 MED ORDER — HEPARIN BOLUS VIA INFUSION
4000.0000 [IU] | Freq: Once | INTRAVENOUS | Status: DC
  Filled 2021-08-12: qty 4000

## 2021-08-12 MED ORDER — IPRATROPIUM-ALBUTEROL 0.5-2.5 (3) MG/3ML IN SOLN
3.0000 mL | Freq: Four times a day (QID) | RESPIRATORY_TRACT | Status: DC
  Administered 2021-08-12 – 2021-08-14 (×4): 3 mL via RESPIRATORY_TRACT
  Filled 2021-08-12 (×4): qty 3

## 2021-08-12 MED ORDER — AZITHROMYCIN 250 MG PO TABS
500.0000 mg | ORAL_TABLET | Freq: Every day | ORAL | Status: DC
  Administered 2021-08-12 – 2021-08-15 (×4): 500 mg via ORAL
  Filled 2021-08-12: qty 2
  Filled 2021-08-12: qty 1
  Filled 2021-08-12 (×2): qty 2
  Filled 2021-08-12: qty 1

## 2021-08-12 MED ORDER — HEPARIN (PORCINE) 25000 UT/250ML-% IV SOLN: 900.0000 [IU]/h | INTRAVENOUS | Status: DC

## 2021-08-12 MED ORDER — SODIUM CHLORIDE 0.9 % IV SOLN
1.0000 g | INTRAVENOUS | Status: DC
  Administered 2021-08-12 – 2021-08-14 (×3): 1 g via INTRAVENOUS
  Filled 2021-08-12 (×2): qty 1
  Filled 2021-08-12: qty 10

## 2021-08-12 MED ORDER — ONDANSETRON HCL 4 MG PO TABS: 4.0000 mg | ORAL_TABLET | Freq: Four times a day (QID) | ORAL | Status: DC | PRN

## 2021-08-12 MED ORDER — IOHEXOL 350 MG/ML SOLN
50.0000 mL | Freq: Once | INTRAVENOUS | Status: AC | PRN
  Administered 2021-08-12: 50 mL via INTRAVENOUS

## 2021-08-12 NOTE — Consult Note (Signed)
ANTICOAGULATION CONSULT NOTE - Follow Up Consult  Pharmacy Consult for heparin gtt Indication: pulmonary embolus  Allergies  Allergen Reactions   Iodinated Contrast Media Other (See Comments)    Blue, Green and Red dyes    Lorazepam     Altered mental state   Risperidone Other (See Comments)    Altered mental state    Sulfamethoxazole-Trimethoprim     Hyper    Patient Measurements: Height: 5' (152.4 cm) Weight: 54.4 kg (120 lb) IBW/kg (Calculated) : 45.5 Heparin Dosing Weight: 54.4kg  Vital Signs: Temp: 97.8 F (36.6 C) (07/04 1454) Temp Source: Oral (07/04 1454) BP: 131/67 (07/04 1800) Pulse Rate: 81 (07/04 1800)  Labs: Recent Labs    08/12/21 1513  HGB 9.5*  HCT 29.7*  PLT 428*  CREATININE 1.45*  Heparin Dosing Weight: 54.4kg  Estimated Creatinine Clearance: 18.9 mL/min (A) (by C-G formula based on SCr of 1.45 mg/dL (H)).  Medications:  No PTA AC/APT Inpatient: +heparin gtt  Assessment: 88yo F w/ h/o CKD, HTN, lymphedema, presenting with SOB & elevated D-dimer. NO AC/APT noted prior to arrival. Concern for PE, pharmacy consulted for management of heparin gtt.   Date Time aPTT/HL Rate/Comment       Baseline Labs: aPTT - pending INR - pending Hgb - 9.5 Plts - 428 D-dimer 1.89  Goal of Therapy:  Heparin level 0.3-0.7 units/ml Monitor platelets by anticoagulation protocol: Yes   Plan:  Give 4000 units bolus x1; then start heparin infusion at 900 units/hr Check anti-Xa level in 8 hours and daily once consecutively therapeutic. Continue to monitor H&H and platelets daily while on heparin gtt.  Shanon Brow Cabrina Shiroma 08/12/2021,6:06 PM

## 2021-08-12 NOTE — H&P (Signed)
History and Physical    Patient: Theresa Snyder PRF:[id number] DOB: February 07, 1930 DOA: 08/12/2021 DOS: the patient was seen and examined on 08/12/2021 PCP: [facility] [place]  Patient coming from: SNF  Chief Complaint:  Chief Complaint  Patient presents with   Shortness of Breath   Abnormal Lab    EMS reports pt having elevated dimer of 1.7   HPI: Theresa Snyder is a 88 y.o. female from [name] with medical history significant of Hypertension, Hyperlipidemia, COPD, Diastolic Heart Failure, Chronic Kidney Disease Stage 3, and Hypothyroidism who presented to the ED with acute progressively worsening dyspnea over the last few days associated with a cough productive of yellow sputum.  The shortness of breath occurs at rest and with exertion ([name] ambulates with a walker), is not related to position, and is not related to food intake.  [name] repeats she has had pneumonia in the past and she feels this is similar.  She denies any dizziness, headaches, fevers, chills, chest pain, nausea, vomiting, diarrhea, abdominal pain, or urinary symptoms.  Her oxygen saturation is 94% on room air with a respiratory rate of 22 breaths/minute.  CT of the Chest was ordered which showed no evidence of a pulmonary embolism.  Review of Systems: As mentioned in the history of present illness. All other systems reviewed and are negative. Past Medical History:  Diagnosis Date   Arthritis    Cancer ([place])    skin   Chronic kidney disease    history of renal insufficiency   History of hiatal hernia    Hypercholesteremia    Hypertension    Hypothyroidism    Lymphedema of leg    Pneumonia    in the past   Seizures ([place]) 2014   no seizures since then   Spinal stenosis    Past Surgical History:  Procedure Laterality Date   APPENDECTOMY     CATARACT EXTRACTION W/PHACO Left 09/25/2015   Procedure: CATARACT EXTRACTION PHACO AND INTRAOCULAR LENS PLACEMENT ([place]);   Surgeon: Estill Cotta, MD;  Location: [facility] ORS;  Service: Ophthalmology;  Laterality: Left;  [place] 01:23 AP% 24.0 CDE 34.98 Fluid pack lot # [id number]   CATARACT EXTRACTION W/PHACO Right 07/01/2016   Procedure: CATARACT EXTRACTION PHACO AND INTRAOCULAR LENS PLACEMENT ([facility]);  Surgeon: Estill Cotta, MD;  Location: [facility] ORS;  Service: Ophthalmology;  Laterality: Right;  [place] 1:25.9 AP% 24.8 CDE 40.61 Fluid Pack Lot # [id number]   HIP ARTHROPLASTY Left 07/10/2021   Procedure: ARTHROPLASTY BIPOLAR HIP (HEMIARTHROPLASTY);  Surgeon: Renee Harder, MD;  Location: [facility] ORS;  Service: Orthopedics;  Laterality: Left;   TUBAL LIGATION     Social History:  reports that she has never smoked. She has never used smokeless tobacco. She reports that she does not drink alcohol and does not use drugs.  Allergies  Allergen Reactions   Iodinated Contrast Media Other (See Comments)    Blue, Green and Red dyes    Lorazepam     Altered mental state   Risperidone Other (See Comments)    Altered mental state    Sulfamethoxazole-Trimethoprim     Hyper    No family history on file.  Prior to Admission medications   Medication Sig Start Date End Date Taking? Authorizing Provider  amLODipine (NORVASC) 10 MG tablet Take 10 mg by mouth daily with breakfast. for high blood pressure 01/12/15   [provider]  benazepril (LOTENSIN) 10 MG tablet Take 1 tablet (10 mg total) by mouth daily.  07/15/21   Swayze, Ava, DO  docusate sodium (COLACE) 100 MG capsule Take 1 capsule (100 mg total) by mouth 2 (two) times daily as needed for mild constipation. 05/24/21   Spongberg, Audie Pinto, MD  enoxaparin (LOVENOX) 40 MG/0.4ML injection Inject 0.4 mLs (40 mg total) into the skin daily for 14 days. 07/14/21 07/28/21  Shahmehdi, Valeria Batman, MD  feeding supplement (ENSURE ENLIVE / ENSURE PLUS) LIQD Take 237 mLs by mouth 3 (three) times daily between meals. 07/14/21   Swayze, Ava, DO  gabapentin (NEURONTIN) 300 MG capsule Take  2 capsules (600 mg total) by mouth at bedtime. 07/14/21   Swayze, Ava, DO  hydrochlorothiazide (HYDRODIURIL) 25 MG tablet Take 25 mg by mouth daily. 08/15/12   [provider]  HYDROcodone-acetaminophen (NORCO) 7.5-325 MG tablet Take 1-2 tablets by mouth every 6 (six) hours as needed for severe pain (pain score 7-10). 07/14/21   Renee Harder, MD  levothyroxine (SYNTHROID, LEVOTHROID) 88 MCG tablet Take 88 mcg by mouth daily before breakfast. 01/12/15   [provider]  metoprolol tartrate (LOPRESSOR) 25 MG tablet Take 1 tablet (25 mg total) by mouth 2 (two) times daily. 07/14/21   Swayze, Ava, DO  Multiple Vitamin (MULTIVITAMIN WITH MINERALS) TABS tablet Take 1 tablet by mouth daily. 07/15/21   Swayze, Ava, DO  pravastatin (PRAVACHOL) 20 MG tablet Take 20 mg by mouth at bedtime.     [provider]    Physical Exam: Vitals:   08/12/21 1630 08/12/21 1700 08/12/21 1730 08/12/21 1800  BP: (!) 132/55 (!) 118/58 121/69 131/67  Pulse: 74 75 76 81  Resp: (!) 23 (!) 21  (!) 26  Temp:      TempSrc:      SpO2: 99% 95% 96% 96%  Weight:      Height:       Examination: General exam: chronically ill appearing mental status intact, fatigued and malaise HEENT: NCAT, PERRL Respiratory system: bibasilar crackles, right>left Cardiovascular system: Did not appreciate a murmur, regular, mild JVD Gastrointestinal system: No flank pain, Abdomen soft, NT,ND, BS+. Nervous System: No focal deficits. Extremities: chronic lower extremity edema, baker's cyst on left, distal peripheral pulses palpable.  Skin: venous stasis MSK: physical deconditioning  Data Reviewed:  There are no new results to review at this time.  CT Angio Chest PE W and/or Wo Contrast CLINICAL DATA:  Shortness of breath and elevated D-dimer.  EXAM: CT ANGIOGRAPHY CHEST WITH CONTRAST  TECHNIQUE: Multidetector CT imaging of the chest was performed using the standard protocol during bolus administration of  intravenous contrast. Multiplanar CT image reconstructions and MIPs were obtained to evaluate the vascular anatomy.  RADIATION DOSE REDUCTION: This exam was performed according to the departmental dose-optimization program which includes automated exposure control, adjustment of the mA and/or kV according to patient size and/or use of iterative reconstruction technique.  CONTRAST:  48m OMNIPAQUE IOHEXOL 350 MG/ML SOLN  COMPARISON:  March 23, 2020  FINDINGS: Cardiovascular: There is marked severity calcification of the aortic arch, without evidence of aortic aneurysm or dissection. Satisfactory opacification of the pulmonary arteries to the segmental level. No evidence of pulmonary embolism. Normal heart size with moderate to marked severity coronary artery calcification. No pericardial effusion.  Mediastinum/Nodes: No enlarged mediastinal, hilar, or axillary lymph nodes. Thyroid gland, trachea, and esophagus demonstrate no significant findings. A very large hiatal hernia is seen. This contains the entire stomach and a large portion of large bowel.  Lungs/Pleura: Mild to moderate severity bilateral multifocal infiltrates are seen.  Mild bibasilar atelectasis is also noted.  Small bilateral pleural effusions are seen.  There is no evidence of a pneumothorax.  Upper Abdomen: Very large hiatal hernia, as described above.  Musculoskeletal: Multilevel degenerative changes are seen throughout the thoracic spine.  Review of the MIP images confirms the above findings.  IMPRESSION: 1. No evidence of pulmonary embolism. 2. Mild to moderate severity bilateral multifocal infiltrates. 3. Small bilateral pleural effusions. 4. Very large hiatal hernia.  Aortic Atherosclerosis (ICD10-I70.0).  Electronically Signed   By: Virgina Norfolk M.D.   On: 08/12/2021 19:03 US Venous Img Lower Bilateral CLINICAL DATA:  Lower extremity swelling.  EXAM: BILATERAL LOWER EXTREMITY VENOUS  DOPPLER ULTRASOUND  TECHNIQUE: Gray-scale sonography with compression, as well as color and duplex ultrasound, were performed to evaluate the deep venous system(s) from the level of the common femoral vein through the popliteal and proximal calf veins.  COMPARISON:  Remote exam 10/14/2012  FINDINGS: VENOUS  Normal compressibility of the common femoral, superficial femoral, and popliteal veins, as well as the visualized calf veins. Visualized portions of profunda femoral vein and great saphenous vein unremarkable. No filling defects to suggest DVT on grayscale or color Doppler imaging. Doppler waveforms show normal direction of venous flow, normal respiratory plasticity and response to augmentation.  OTHER  Left popliteal fluid collection measuring 5.5 x 0.8 x 1.7 cm, avascular, typically Baker cyst.  Limitations: none  IMPRESSION: 1. No evidence of bilateral lower extremity DVT. 2. Left-sided Baker cyst.  Electronically Signed   By: Keith Rake M.D.   On: 08/12/2021 17:24 DG Chest Portable 1 View CLINICAL DATA:  SOB  EXAM: PORTABLE CHEST 1 VIEW  COMPARISON:  Chest x-ray 05/09/2021, CT chest 03/23/2020  FINDINGS: Large hiatal hernia.  The heart and mediastinal contours are unchanged. Aortic calcification.  Biapical pleural/pulmonary scarring. Opacification of the left lower lung zone likely due to atelectasis and superimpose large hiatal hernia. No focal consolidation. Increased central markings. Likely trace bilateral pleural effusions. No pneumothorax.  No acute osseous abnormality.  IMPRESSION: 1. Increased interstitial markings which could represent bronchitic changes versus pulmonary edema. 2. Likely query trace bilateral pleural effusions. 3. Large hiatal hernia. 4.  Aortic Atherosclerosis (ICD10-I70.0).  Electronically Signed   By: Iven Finn M.D.   On: 08/12/2021 15:48   Lab Results  Component Value Date   WBC 16.3 (H) 08/12/2021    HGB 9.5 (L) 08/12/2021   HCT 29.7 (L) 08/12/2021   MCV 98.0 08/12/2021   PLT 428 (H) 08/12/2021   Lab Results  Component Value Date   CREATININE 1.45 (H) 08/12/2021    Assessment and Plan: Acute Dyspnea: CTA was negative for PE.  This is due to acute pneumonia and mild fluid overload. - Respiratory status is stable on room air. - Start antibiotics and diuretics.  Community Acquired Pneumonia: - Start IV Ceftriaxone 1g QD and Azithromycin 500 mg QD for synergistic effect x 5 days.  Acute on Chronic Diastolic Heart Failure: Chest imaging shows pulmonary congestion and small bilateral pleural effusions. - Start IV Lasix 20 mg BID. - Check Echo.  Chronic Kidney Disease Stage 3b: - Creatinine is at baseline.  Neuropathic Pain: - Gabapentin nightly.  Hypertension: BP is stable. - Continue home meds.  Hyperlipidemia: - Statin.  Hypothyroidism: - Levothyroxine  Venous Stasis / Chronic Lower Extremity Edema Lumbar Spinal Stenosis Osteoporosis Left Popliteal Baker's Cyst Large Hiatal Hernia Aortic Atherosclerosis   Advance Care Planning:   Code Status: Prior   Consults: None  Family Communication:  None present.  I spoke with patient at bedside.  Severity of Illness: The appropriate patient status for this patient is INPATIENT. Inpatient status is judged to be reasonable and necessary in order to provide the required intensity of service to ensure the patient's safety. The patient's presenting symptoms, physical exam findings, and initial radiographic and laboratory data in the context of their chronic comorbidities is felt to place them at high risk for further clinical deterioration. Furthermore, it is not anticipated that the patient will be medically stable for discharge from the hospital within 2 midnights of admission.   * I certify that at the point of admission it is my clinical judgment that the patient will require inpatient hospital care spanning beyond 2  midnights from the point of admission due to high intensity of service, high risk for further deterioration and high frequency of surveillance required.*  Author: George Hugh, MD 08/12/2021 6:06 PM  For on call review [email or web address].

## 2021-08-12 NOTE — ED Triage Notes (Signed)
Pt arrived via EMS from [facility] d/t SOB and elevated D-dimer of 1.7. Pt also has recent diagnosis of pneumonia.

## 2021-08-12 NOTE — ED Provider Notes (Signed)
[facility] Provider Note    Event Date/Time   First MD Initiated Contact with Patient 08/12/21 1502     (approximate)   History   Shortness of Breath and Abnormal Lab (EMS reports pt having elevated dimer of 1.7)   HPI  Theresa Snyder is a 88 y.o. female   with past medical history of CKD, hypertension, lymphedema, here with shortness of breath.  The patient states that for the last 2 days, she has had severe shortness of breath.  It began fairly acutely.  She has had a cough.  She states she has a history of pneumonia with similar presentations.  Denies known sick contacts but she does live in a nursing facility.  Reports that throughout the day today, she has felt very dyspneic, even at rest, and had difficulty getting around.  She normally is able to move with a walker without significant difficulty.  She reportedly had a chest x-ray and D-dimer done at her facility which showed pneumonia and a positive D-dimer so she was sent in for further evaluation.  Denies any abdominal pain.  No nausea or vomiting.  No urinary symptoms.  No current chest pain.      Physical Exam   Triage Vital Signs: ED Triage Vitals  Enc Vitals Group     BP 08/12/21 1454 (!) 117/53     Pulse Rate 08/12/21 1454 73     Resp 08/12/21 1454 18     Temp 08/12/21 1454 97.8 F (36.6 C)     Temp Source 08/12/21 1454 Oral     SpO2 08/12/21 1454 96 %     Weight 08/12/21 1455 120 lb (54.4 kg)     Height 08/12/21 1455 5' (1.524 m)     Head Circumference --      Peak Flow --      Pain Score 08/12/21 1455 0     Pain Loc --      Pain Edu? --      Excl. in [place]? --     Most recent vital signs: Vitals:   08/12/21 2300 08/13/21 0106  BP: 126/64 (!) 127/54  Pulse: 88 94  Resp: (!) 23 (!) 21  Temp:    SpO2: 97% 94%     General: Awake, no distress.  CV:  Good peripheral perfusion.  Regular rate and rhythm. Resp:  Mild tachypnea noted, bibasilar Rales, but particularly on the  left. Abd:  No distention.  No tenderness. Other:  2+ pitting edema bilateral lower extremities.   ED Results / Procedures / Treatments   Labs (all labs ordered are listed, but only abnormal results are displayed) Labs Reviewed  CBC WITH DIFFERENTIAL/PLATELET - Abnormal; Notable for the following components:      Result Value   WBC 16.3 (*)    RBC 3.03 (*)    Hemoglobin 9.5 (*)    HCT 29.7 (*)    Platelets 428 (*)    Neutro Abs 12.9 (*)    Monocytes Absolute 1.1 (*)    Abs Immature Granulocytes 0.24 (*)    All other components within normal limits  COMPREHENSIVE METABOLIC PANEL - Abnormal; Notable for the following components:   Sodium 131 (*)    Glucose, Bld 136 (*)    BUN 31 (*)    Creatinine, Ser 1.45 (*)    Albumin 3.1 (*)    GFR, Estimated 34 (*)    All other components within normal limits  D-DIMER, QUANTITATIVE - Abnormal;  Notable for the following components:   D-Dimer, Quant 1.89 (*)    All other components within normal limits  BRAIN NATRIURETIC PEPTIDE - Abnormal; Notable for the following components:   B Natriuretic Peptide 658.8 (*)    All other components within normal limits  APTT - Abnormal; Notable for the following components:   aPTT 41 (*)    All other components within normal limits  SARS CORONAVIRUS 2 BY RT PCR  CULTURE, BLOOD (ROUTINE X 2)  CULTURE, BLOOD (ROUTINE X 2)  LACTIC ACID, PLASMA  LACTIC ACID, PLASMA  PROCALCITONIN  PROTIME-INR  BASIC METABOLIC PANEL  CBC  TROPONIN I (HIGH SENSITIVITY)  TROPONIN I (HIGH SENSITIVITY)     EKG Normal sinus rhythm, ventricular rate 73.  PR 129, QRS of 9, QTc 441.  No acute ST elevations or depressions.  No EKG evidence of acute ischemia or infarct   RADIOLOGY Chest x-ray:CHF, bilateral effusions DVT BL: Negative for DVT CT pending   I also independently reviewed and agree with radiologist interpretations.   PROCEDURES:  Critical Care performed: No  .1-3 Lead EKG  Interpretation  Performed by: Duffy Bruce, MD Authorized by: Duffy Bruce, MD     Interpretation: normal     ECG rate:  80-90   ECG rate assessment: normal     Rhythm: sinus rhythm     Ectopy: none     Conduction: normal   Comments:     Indication: SOB     MEDICATIONS ORDERED IN ED: Medications  enoxaparin (LOVENOX) injection 30 mg (30 mg Subcutaneous Given 08/12/21 2307)  senna-docusate (Senokot-S) tablet 1 tablet (has no administration in time range)  ondansetron (ZOFRAN) tablet 4 mg (has no administration in time range)    Or  ondansetron (ZOFRAN) injection 4 mg (has no administration in time range)  amLODipine (NORVASC) tablet 10 mg (has no administration in time range)  benazepril (LOTENSIN) tablet 10 mg (has no administration in time range)  levothyroxine (SYNTHROID) tablet 88 mcg (has no administration in time range)  metoprolol tartrate (LOPRESSOR) tablet 25 mg (25 mg Oral Given 08/12/21 2307)  multivitamin with minerals tablet 1 tablet (has no administration in time range)  pravastatin (PRAVACHOL) tablet 20 mg (20 mg Oral Given 08/12/21 2307)  HYDROcodone-acetaminophen (NORCO) 7.5-325 MG per tablet 1 tablet (has no administration in time range)  furosemide (LASIX) injection 20 mg (20 mg Intravenous Given 08/12/21 2007)  cefTRIAXone (ROCEPHIN) 1 g in sodium chloride 0.9 % 100 mL IVPB (0 g Intravenous Stopped 08/12/21 2038)  azithromycin (ZITHROMAX) tablet 500 mg (500 mg Oral Given 08/12/21 2050)  ipratropium-albuterol (DUONEB) 0.5-2.5 (3) MG/3ML nebulizer solution 3 mL (3 mLs Nebulization Not Given 08/13/21 0125)  gabapentin (NEURONTIN) capsule 600 mg (600 mg Oral Given 08/13/21 0034)  iohexol (OMNIPAQUE) 350 MG/ML injection 50 mL (50 mLs Intravenous Contrast Given 08/12/21 1845)     IMPRESSION / MDM / ASSESSMENT AND PLAN / ED COURSE  I reviewed the triage vital signs and the nursing notes.                               The patient is on the cardiac monitor to evaluate for  evidence of arrhythmia and/or significant heart rate changes.   Ddx:  Differential includes the following, with pertinent life- or limb-threatening emergencies considered:  CHF exacerbation, PNA, ACS, PE, COVID, anemia  Patient's presentation is most consistent with acute presentation with potential threat to life or bodily  function.  MDM:  88 yo F with h/o HTN, syncope (just seen by Dr. Nehemiah Massed) here with SOB vs PNA. Pt dyspneic on arrival, with bibasilar rales. Initial concern for PNA vs CHF. CXR is more concerning for CHF and she does have b/l leg edema. No known h/o CHF however. Labs show leukocytosis. CMP remarkable for baseline CKD. LA normal. Procal low at 0.11. D-Dimer 1.90. BNP elevated at 658. Trop 13 and EKG nonischemic  Presentation is most c/w acute CHF, with likely need for diuresis. US DVT studies of LE show no DVT. Initially, unclear whether pt could obtain CT ANgio as this was listed as an allergy on her chart. However, on further discussion, it seems pt was actually told not to get contrast "because of her kidneys" per daughter, and has not had an actual allergic reaction. Given + d-dimer with new onset CHF and signs of resp distress, feel CT angio is warranted. Will obtain, admit to medicine for further work-up.   MEDICATIONS GIVEN IN ED: Medications  enoxaparin (LOVENOX) injection 30 mg (30 mg Subcutaneous Given 08/12/21 2307)  senna-docusate (Senokot-S) tablet 1 tablet (has no administration in time range)  ondansetron (ZOFRAN) tablet 4 mg (has no administration in time range)    Or  ondansetron (ZOFRAN) injection 4 mg (has no administration in time range)  amLODipine (NORVASC) tablet 10 mg (has no administration in time range)  benazepril (LOTENSIN) tablet 10 mg (has no administration in time range)  levothyroxine (SYNTHROID) tablet 88 mcg (has no administration in time range)  metoprolol tartrate (LOPRESSOR) tablet 25 mg (25 mg Oral Given 08/12/21 2307)  multivitamin  with minerals tablet 1 tablet (has no administration in time range)  pravastatin (PRAVACHOL) tablet 20 mg (20 mg Oral Given 08/12/21 2307)  HYDROcodone-acetaminophen (NORCO) 7.5-325 MG per tablet 1 tablet (has no administration in time range)  furosemide (LASIX) injection 20 mg (20 mg Intravenous Given 08/12/21 2007)  cefTRIAXone (ROCEPHIN) 1 g in sodium chloride 0.9 % 100 mL IVPB (0 g Intravenous Stopped 08/12/21 2038)  azithromycin (ZITHROMAX) tablet 500 mg (500 mg Oral Given 08/12/21 2050)  ipratropium-albuterol (DUONEB) 0.5-2.5 (3) MG/3ML nebulizer solution 3 mL (3 mLs Nebulization Not Given 08/13/21 0125)  gabapentin (NEURONTIN) capsule 600 mg (600 mg Oral Given 08/13/21 0034)  iohexol (OMNIPAQUE) 350 MG/ML injection 50 mL (50 mLs Intravenous Contrast Given 08/12/21 1845)     Consults:  Hospitalist consulted for admission   EMR reviewed  Dr. Nehemiah Massed visit 07/2021 for HTN/cardiac syncope     FINAL CLINICAL IMPRESSION(S) / ED DIAGNOSES   Final diagnoses:  Acute congestive heart failure, unspecified heart failure type ([place])  Elevated brain natriuretic peptide (BNP) level     Rx / DC Orders   ED Discharge Orders     None        Note:  This document was prepared using Dragon voice recognition software and may include unintentional dictation errors.   Duffy Bruce, MD 08/13/21 [phone]

## 2021-08-13 ENCOUNTER — Inpatient Hospital Stay
Admit: 2021-08-13 | Discharge: 2021-08-13 | Disposition: A | Discharge status: Home / Self Care | Payer: Medicare Other | Attending: Family Medicine | Admitting: Family Medicine

## 2021-08-13 DIAGNOSIS — J189 Pneumonia, unspecified organism: Secondary | ICD-10-CM | POA: Diagnosis present

## 2021-08-13 DIAGNOSIS — D72829 Elevated white blood cell count, unspecified: Secondary | ICD-10-CM

## 2021-08-13 DIAGNOSIS — I5033 Acute on chronic diastolic (congestive) heart failure: Secondary | ICD-10-CM

## 2021-08-13 DIAGNOSIS — M792 Neuralgia and neuritis, unspecified: Secondary | ICD-10-CM

## 2021-08-13 DIAGNOSIS — R0600 Dyspnea, unspecified: Secondary | ICD-10-CM | POA: Diagnosis not present

## 2021-08-13 DIAGNOSIS — J188 Other pneumonia, unspecified organism: Secondary | ICD-10-CM

## 2021-08-13 LAB — ECHOCARDIOGRAM COMPLETE
AR max vel: 1.75 cm2
AV Area VTI: 1.98 cm2
AV Area mean vel: 1.85 cm2
AV Mean grad: 8 mmHg
AV Peak grad: 16.2 mmHg
Ao pk vel: 2.01 m/s
Area-P 1/2: 4.04 cm2
Height: 60 in
MV VTI: 1.75 cm2
S' Lateral: 2.06 cm
Weight: 1920 oz

## 2021-08-13 LAB — BASIC METABOLIC PANEL
Anion gap: 7 (ref 5–15)
BUN: 28 mg/dL — ABNORMAL HIGH (ref 8–23)
CO2: 28 mmol/L (ref 22–32)
Calcium: 8.7 mg/dL — ABNORMAL LOW (ref 8.9–10.3)
Chloride: 99 mmol/L (ref 98–111)
Creatinine, Ser: 1.31 mg/dL — ABNORMAL HIGH (ref 0.44–1.00)
GFR, Estimated: 39 mL/min — ABNORMAL LOW (ref >60)
Glucose, Bld: 99 mg/dL (ref 70–99)
Potassium: 4.4 mmol/L (ref 3.5–5.1)
Sodium: 134 mmol/L — ABNORMAL LOW (ref 135–145)

## 2021-08-13 LAB — CBC
HCT: 27.9 % — ABNORMAL LOW (ref 36.0–46.0)
Hemoglobin: 9.2 g/dL — ABNORMAL LOW (ref 12.0–15.0)
MCH: 31.8 pg (ref 26.0–34.0)
MCHC: 33 g/dL (ref 30.0–36.0)
MCV: 96.5 fL (ref 80.0–100.0)
Platelets: 437 10*3/uL — ABNORMAL HIGH (ref 150–400)
RBC: 2.89 MIL/uL — ABNORMAL LOW (ref 3.87–5.11)
RDW: 12.6 % (ref 11.5–15.5)
WBC: 14.9 10*3/uL — ABNORMAL HIGH (ref 4.0–10.5)
nRBC: 0 % (ref 0.0–0.2)

## 2021-08-13 MED ORDER — GABAPENTIN 300 MG PO CAPS
600.0000 mg | ORAL_CAPSULE | Freq: Every day | ORAL | Status: DC
Start: 2021-08-13 — End: 2021-08-15
  Administered 2021-08-13 – 2021-08-14 (×3): 600 mg via ORAL
  Filled 2021-08-13 (×3): qty 2

## 2021-08-13 NOTE — Hospital Course (Signed)
Theresa Snyder is Donnielle Addison 88 y.o. female from [name] with medical history significant of Hypertension, Hyperlipidemia, COPD, Diastolic Heart Failure, Chronic Kidney Disease Stage 3, and Hypothyroidism who presented to the ED with acute progressively worsening dyspnea over the last few days associated with Tamelia Michalowski cough productive of yellow sputum.  The shortness of breath occurs at rest and with exertion (Ms. Urbanski ambulates with Chudney Scheffler walker), is not related to position, and is not related to food intake.  Ms. Philson repeats she has had pneumonia in the past and she feels this is similar.  She denies any dizziness, headaches, fevers, chills, chest pain, nausea, vomiting, diarrhea, abdominal pain, or urinary symptoms.  Her oxygen saturation is 94% on room air with Anniebelle Devore respiratory rate of 22 breaths/minute.  CT of the Chest was ordered which showed no evidence of Kieli Golladay pulmonary embolism.  She's been admitted to pneumonia with suspected HF exacerbation as well.  See below for additional details

## 2021-08-13 NOTE — Progress Notes (Signed)
PROGRESS NOTE    Theresa Snyder  SWF:[id number] DOB: May 31, 1929 DOA: 08/12/2021 [facility]CP: [facility] [place]  Chief Complaint  Patient presents with   Shortness of Breath   Abnormal Lab    EMS reports pt having elevated dimer of 1.7    Brief Narrative:  Theresa Snyder is Theresa Snyder 88 y.o. female from [name] with medical history significant of Hypertension, Hyperlipidemia, COPD, Diastolic Heart Failure, Chronic Kidney Disease Stage 3, and Hypothyroidism who presented to the ED with acute progressively worsening dyspnea over the last few days associated with Theresa Snyder cough productive of yellow sputum.  The shortness of breath occurs at rest and with exertion (Theresa Snyder ambulates with Theresa Snyder Theresa), is not related to position, and is not related to food intake.  Theresa Snyder repeats she has had pneumonia in the past and she feels this is similar.  She denies any dizziness, headaches, fevers, chills, chest pain, nausea, vomiting, diarrhea, abdominal pain, or urinary symptoms.  Her oxygen saturation is 94% on room air with Theresa Snyder respiratory rate of 22 breaths/minute.  CT of the Chest was ordered which showed no evidence of Theresa Snyder pulmonary embolism.  She's been admitted to pneumonia with suspected HF exacerbation as well.  See below for additional details    Assessment & Plan:   Principal Problem:   Acute dyspnea Active Problems:   Multifocal pneumonia   Leukocytosis   Acute on chronic diastolic CHF (congestive heart failure) (HCC)   Stage 3b chronic kidney disease (CKD) (HCC)   Neuropathic pain   Hyperlipidemia   Hypothyroidism   Assessment and Plan: Leukocytosis Due to presumed pneumonia  Multifocal pneumonia Currently on RA, but still with SOB CT chest negative for PE, but with mild to moderate bilateral multifocal infiltrates Continue ceftriaxone/azithromycin MRSA PCR, urine strep, urine legionella, sputum cx if able to collect Negative covid Blood cultures  pending  Acute on chronic diastolic CHF (congestive heart failure) (HCC) BNP more elevated than baseline CT with small bilateral effusions Echo pending  Continue BID lasix Strict I/O, daily weights  Stage 3b chronic kidney disease (CKD) (HCC) Baseline creatinine appears to be 1.2-1.3ish Close to baseline today Follow with diuresis  Neuropathic pain Gabapentin  Hyperlipidemia pravastatin  Hypothyroidism synthroid      DVT prophylaxis: lovenox Code Status: full Family Communication: none Disposition:   Status is: Inpatient Remains inpatient appropriate because: need for additional w/u IV abx   Consultants:  none  Procedures:  Echo pending  Antimicrobials:  Anti-infectives (From admission, onward)    Start     Dose/Rate Route Frequency Ordered Stop   08/12/21 2000  cefTRIAXone (ROCEPHIN) 1 g in sodium chloride 0.9 % 100 mL IVPB        1 g 200 mL/hr over 30 Minutes Intravenous Every 24 hours 08/12/21 1946 08/17/21 1959   08/12/21 2000  azithromycin (ZITHROMAX) tablet 500 mg        500 mg Oral Daily 08/12/21 1946 08/17/21 0959       Subjective: No new complaints Still SOB, thinks she needs another day  Objective: Vitals:   08/13/21 0709 08/13/21 0900 08/13/21 1100 08/13/21 1431  BP: (!) 135/100 (!) 111/56 115/88 (!) 117/59  Pulse: 78 74 78 86  Resp: (!) 25 (!) 27 (!) 21 (!) 24  Temp: 98.2 F (36.8 C)     TempSrc: Oral     SpO2: 96% 96% 98% 98%  Weight:      Height:  Intake/Output Summary (Last 24 hours) at 08/13/2021 1631 Last data filed at 08/13/2021 0830 Gross per 24 hour  Intake 100 ml  Output 3550 ml  Net -3450 ml   Filed Weights   08/12/21 1455  Weight: 54.4 kg    Examination:  General exam: Appears calm and comfortable  Respiratory system: bibasilar crackles Cardiovascular system: RRR Gastrointestinal system: Abdomen is nondistended, soft and nontender.  Central nervous system: Alert and oriented. No focal neurological  deficits. Extremities: chronic LE edema   Data Reviewed: I have personally reviewed following labs and imaging studies  CBC: Recent Labs  Lab 08/12/21 1513 08/13/21 0500  WBC 16.3* 14.9*  NEUTROABS 12.9*  --   HGB 9.5* 9.2*  HCT 29.7* 27.9*  MCV 98.0 96.5  PLT 428* 437*    Basic Metabolic Panel: Recent Labs  Lab 08/12/21 1513 08/13/21 0500  NA 131* 134*  K 4.8 4.4  CL 98 99  CO2 24 28  GLUCOSE 136* 99  BUN 31* 28*  CREATININE 1.45* 1.31*  CALCIUM 8.9 8.7*    GFR: Estimated Creatinine Clearance: 20.9 mL/min (Theresa Snyder) (by C-G formula based on SCr of 1.31 mg/dL (H)).  Liver Function Tests: Recent Labs  Lab 08/12/21 1513  AST 20  ALT 12  ALKPHOS 111  BILITOT 0.6  PROT 6.9  ALBUMIN 3.1*    CBG: No results for input(s): "GLUCAP" in the last 168 hours.   Recent Results (from the past 240 hour(s))  Blood culture (routine x 2)     Status: None (Preliminary result)   Collection Time: 08/12/21  3:17 PM   Specimen: BLOOD  Result Value Ref Range Status   Specimen Description BLOOD BLOOD LEFT FOREARM  Final   Special Requests   Final    BOTTLES DRAWN AEROBIC AND ANAEROBIC Blood Culture adequate volume   Culture   Final    NO GROWTH < 24 HOURS Performed at [facility], [address] [place], [place] [address]    Report Status PENDING  Incomplete  Blood culture (routine x 2)     Status: None (Preliminary result)   Collection Time: 08/12/21  3:41 PM   Specimen: BLOOD  Result Value Ref Range Status   Specimen Description BLOOD RIGHT ASSIST CONTROL  Final   Special Requests   Final    BOTTLES DRAWN AEROBIC AND ANAEROBIC Blood Culture adequate volume   Culture   Final    NO GROWTH < 12 HOURS Performed at [facility], [address], [place], [place] [address]    Report Status PENDING  Incomplete  SARS Coronavirus 2 by RT PCR (hospital order, performed in [place] hospital lab) *cepheid single result test* Anterior Nasal Swab     Status:  None   Collection Time: 08/12/21  3:41 PM   Specimen: Anterior Nasal Swab  Result Value Ref Range Status   SARS Coronavirus 2 by RT PCR NEGATIVE NEGATIVE Final    Comment: (NOTE) SARS-CoV-2 target nucleic acids are NOT DETECTED.  The SARS-CoV-2 RNA is generally detectable in upper and lower respiratory specimens during the acute phase of infection. The lowest concentration of SARS-CoV-2 viral copies this assay can detect is 250 copies / mL. Jermarion Poffenberger negative result does not preclude SARS-CoV-2 infection and should not be used as the sole basis for treatment or other patient management decisions.  Kashira Behunin negative result may occur with improper specimen collection / handling, submission of specimen other than nasopharyngeal swab, presence of viral mutation(s) within the areas targeted by  this assay, and inadequate number of viral copies (<250 copies / mL). Shayann Garbutt negative result must be combined with clinical observations, patient history, and epidemiological information.  Fact Sheet for Patients:   https://www.patel.info/  Fact Sheet for Healthcare Providers: https://hall.com/  This test is not yet approved or  cleared by the [place] FDA and has been authorized for detection and/or diagnosis of SARS-CoV-2 by FDA under an Emergency Use Authorization (EUA).  This EUA will remain in effect (meaning this test can be used) for the duration of the COVID-19 declaration under Section 564(b)(1) of the Act, 21 U.S.C. section 360bbb-3(b)(1), unless the authorization is terminated or revoked sooner.  Performed at [facility], [address] [place], [place] [address]          Radiology Studies: CT Angio Chest PE W and/or Wo Contrast  Result Date: 08/12/2021 CLINICAL DATA:  Shortness of breath and elevated D-dimer. EXAM: CT ANGIOGRAPHY CHEST WITH CONTRAST TECHNIQUE: Multidetector CT imaging of the chest was performed using the standard  protocol during bolus administration of intravenous contrast. Multiplanar CT image reconstructions and MIPs were obtained to evaluate the vascular anatomy. RADIATION DOSE REDUCTION: This exam was performed according to the departmental dose-optimization program which includes automated exposure control, adjustment of the mA and/or kV according to patient size and/or use of iterative reconstruction technique. CONTRAST:  50m OMNIPAQUE IOHEXOL 350 MG/ML SOLN COMPARISON:  March 23, 2020 FINDINGS: Cardiovascular: There is marked severity calcification of the aortic arch, without evidence of aortic aneurysm or dissection. Satisfactory opacification of the pulmonary arteries to the segmental level. No evidence of pulmonary embolism. Normal heart size with moderate to marked severity coronary artery calcification. No pericardial effusion. Mediastinum/Nodes: No enlarged mediastinal, hilar, or axillary lymph nodes. Thyroid gland, trachea, and esophagus demonstrate no significant findings. Kenzie Thoreson very large hiatal hernia is seen. This contains the entire stomach and Gaston Dase large portion of large bowel. Lungs/Pleura: Mild to moderate severity bilateral multifocal infiltrates are seen. Mild bibasilar atelectasis is also noted. Small bilateral pleural effusions are seen. There is no evidence of Hedy Garro pneumothorax. Upper Abdomen: Very large hiatal hernia, as described above. Musculoskeletal: Multilevel degenerative changes are seen throughout the thoracic spine. Review of the MIP images confirms the above findings. IMPRESSION: 1. No evidence of pulmonary embolism. 2. Mild to moderate severity bilateral multifocal infiltrates. 3. Small bilateral pleural effusions. 4. Very large hiatal hernia. Aortic Atherosclerosis (ICD10-I70.0). Electronically Signed   By: TVirgina NorfolkM.D.   On: 08/12/2021 19:03   UKoreaVenous Img Lower Bilateral  Result Date: 08/12/2021 CLINICAL DATA:  Lower extremity swelling. EXAM: BILATERAL LOWER EXTREMITY VENOUS  DOPPLER ULTRASOUND TECHNIQUE: Gray-scale sonography with compression, as well as color and duplex ultrasound, were performed to evaluate the deep venous system(s) from the level of the common femoral vein through the popliteal and proximal calf veins. COMPARISON:  Remote exam 10/14/2012 FINDINGS: VENOUS Normal compressibility of the common femoral, superficial femoral, and popliteal veins, as well as the visualized calf veins. Visualized portions of profunda femoral vein and great saphenous vein unremarkable. No filling defects to suggest DVT on grayscale or color Doppler imaging. Doppler waveforms show normal direction of venous flow, normal respiratory plasticity and response to augmentation. OTHER Left popliteal fluid collection measuring 5.5 x 0.8 x 1.7 cm, avascular, typically Baker cyst. Limitations: none IMPRESSION: 1. No evidence of bilateral lower extremity DVT. 2. Left-sided Baker cyst. Electronically Signed   By: MKeith RakeM.D.   On: 08/12/2021 17:24   DG Chest Portable 1  View  Result Date: 08/12/2021 CLINICAL DATA:  SOB EXAM: PORTABLE CHEST 1 VIEW COMPARISON:  Chest x-ray 05/09/2021, CT chest 03/23/2020 FINDINGS: Large hiatal hernia. The heart and mediastinal contours are unchanged. Aortic calcification. Biapical pleural/pulmonary scarring. Opacification of the left lower lung zone likely due to atelectasis and superimpose large hiatal hernia. No focal consolidation. Increased central markings. Likely trace bilateral pleural effusions. No pneumothorax. No acute osseous abnormality. IMPRESSION: 1. Increased interstitial markings which could represent bronchitic changes versus pulmonary edema. 2. Likely query trace bilateral pleural effusions. 3. Large hiatal hernia. 4.  Aortic Atherosclerosis (ICD10-I70.0). Electronically Signed   By: Iven Finn M.D.   On: 08/12/2021 15:48        Scheduled Meds:  amLODipine  10 mg Oral Q breakfast   azithromycin  500 mg Oral Daily   benazepril  10  mg Oral Daily   enoxaparin (LOVENOX) injection  30 mg Subcutaneous Q24H   furosemide  20 mg Intravenous Q12H   gabapentin  600 mg Oral QHS   ipratropium-albuterol  3 mL Nebulization Q6H   levothyroxine  88 mcg Oral QAC breakfast   metoprolol tartrate  25 mg Oral BID   multivitamin with minerals  1 tablet Oral Daily   pravastatin  20 mg Oral QHS   Continuous Infusions:  cefTRIAXone (ROCEPHIN)  IV Stopped (08/12/21 2038)     LOS: 1 day    Time spent: over 30 min    Fayrene Helper, MD [facility]   To contact the attending provider between 7A-7P or the covering provider during after hours 7P-7A, please log into the web site [email or web address] and access using universal [place] password for that web site. If you do not have the password, please call the hospital operator.  08/13/2021, 4:31 PM

## 2021-08-13 NOTE — Assessment & Plan Note (Signed)
BNP more elevated than baseline CT with small bilateral effusions Echo pending  Continue BID lasix Strict I/O, daily weights

## 2021-08-13 NOTE — Assessment & Plan Note (Signed)
Currently on RA, but still with SOB CT chest negative for PE, but with mild to moderate bilateral multifocal infiltrates Continue ceftriaxone/azithromycin MRSA PCR, urine strep, urine legionella, sputum cx if able to collect Negative covid Blood cultures pending

## 2021-08-13 NOTE — Assessment & Plan Note (Signed)
Due to presumed pneumonia

## 2021-08-13 NOTE — Progress Notes (Signed)
*  PRELIMINARY RESULTS* Echocardiogram 2D Echocardiogram has been performed.  Theresa Snyder 08/13/2021, 2:06 PM

## 2021-08-13 NOTE — Assessment & Plan Note (Signed)
Gabapentin

## 2021-08-13 NOTE — Assessment & Plan Note (Signed)
synthroid °

## 2021-08-13 NOTE — Assessment & Plan Note (Signed)
Baseline creatinine appears to be 1.2-1.3ish Close to baseline today Follow with diuresis

## 2021-08-13 NOTE — Assessment & Plan Note (Signed)
pravastatin

## 2021-08-14 ENCOUNTER — Encounter: Payer: Self-pay | Admitting: Family Medicine

## 2021-08-14 DIAGNOSIS — E782 Mixed hyperlipidemia: Secondary | ICD-10-CM

## 2021-08-14 DIAGNOSIS — I5033 Acute on chronic diastolic (congestive) heart failure: Secondary | ICD-10-CM | POA: Diagnosis not present

## 2021-08-14 DIAGNOSIS — J189 Pneumonia, unspecified organism: Principal | ICD-10-CM

## 2021-08-14 DIAGNOSIS — N1832 Chronic kidney disease, stage 3b: Secondary | ICD-10-CM

## 2021-08-14 DIAGNOSIS — D72829 Elevated white blood cell count, unspecified: Secondary | ICD-10-CM

## 2021-08-14 DIAGNOSIS — R0600 Dyspnea, unspecified: Secondary | ICD-10-CM | POA: Diagnosis not present

## 2021-08-14 DIAGNOSIS — M792 Neuralgia and neuritis, unspecified: Secondary | ICD-10-CM

## 2021-08-14 LAB — CBC WITH DIFFERENTIAL/PLATELET
Abs Immature Granulocytes: 0.27 10*3/uL — ABNORMAL HIGH (ref 0.00–0.07)
Basophils Absolute: 0.1 10*3/uL (ref 0.0–0.1)
Basophils Relative: 0 %
Eosinophils Absolute: 0.1 10*3/uL (ref 0.0–0.5)
Eosinophils Relative: 1 %
HCT: 29.7 % — ABNORMAL LOW (ref 36.0–46.0)
Hemoglobin: 9.7 g/dL — ABNORMAL LOW (ref 12.0–15.0)
Immature Granulocytes: 2 %
Lymphocytes Relative: 21 %
Lymphs Abs: 2.6 10*3/uL (ref 0.7–4.0)
MCH: 31.2 pg (ref 26.0–34.0)
MCHC: 32.7 g/dL (ref 30.0–36.0)
MCV: 95.5 fL (ref 80.0–100.0)
Monocytes Absolute: 1 10*3/uL (ref 0.1–1.0)
Monocytes Relative: 8 %
Neutro Abs: 8.7 10*3/uL — ABNORMAL HIGH (ref 1.7–7.7)
Neutrophils Relative %: 68 %
Platelets: 431 10*3/uL — ABNORMAL HIGH (ref 150–400)
RBC: 3.11 MIL/uL — ABNORMAL LOW (ref 3.87–5.11)
RDW: 12.7 % (ref 11.5–15.5)
WBC: 12.7 10*3/uL — ABNORMAL HIGH (ref 4.0–10.5)
nRBC: 0 % (ref 0.0–0.2)

## 2021-08-14 LAB — COMPREHENSIVE METABOLIC PANEL
ALT: 10 U/L (ref 0–44)
AST: 17 U/L (ref 15–41)
Albumin: 2.7 g/dL — ABNORMAL LOW (ref 3.5–5.0)
Alkaline Phosphatase: 85 U/L (ref 38–126)
Anion gap: 7 (ref 5–15)
BUN: 26 mg/dL — ABNORMAL HIGH (ref 8–23)
CO2: 28 mmol/L (ref 22–32)
Calcium: 8.3 mg/dL — ABNORMAL LOW (ref 8.9–10.3)
Chloride: 99 mmol/L (ref 98–111)
Creatinine, Ser: 1.41 mg/dL — ABNORMAL HIGH (ref 0.44–1.00)
GFR, Estimated: 36 mL/min — ABNORMAL LOW (ref >60)
Glucose, Bld: 94 mg/dL (ref 70–99)
Potassium: 3.5 mmol/L (ref 3.5–5.1)
Sodium: 134 mmol/L — ABNORMAL LOW (ref 135–145)
Total Bilirubin: 0.4 mg/dL (ref 0.3–1.2)
Total Protein: 6 g/dL — ABNORMAL LOW (ref 6.5–8.1)

## 2021-08-14 LAB — MAGNESIUM: Magnesium: 1.5 mg/dL — ABNORMAL LOW (ref 1.7–2.4)

## 2021-08-14 LAB — PHOSPHORUS: Phosphorus: 4.1 mg/dL (ref 2.5–4.6)

## 2021-08-14 MED ORDER — IPRATROPIUM-ALBUTEROL 0.5-2.5 (3) MG/3ML IN SOLN: 3.0000 mL | Freq: Four times a day (QID) | RESPIRATORY_TRACT | Status: DC

## 2021-08-14 MED ORDER — IPRATROPIUM-ALBUTEROL 0.5-2.5 (3) MG/3ML IN SOLN: 3.0000 mL | RESPIRATORY_TRACT | Status: DC | PRN

## 2021-08-14 NOTE — TOC Initial Note (Signed)
Transition of Care [facility]) - Initial/Assessment Note    Patient Details  Name: Theresa Snyder MRN: [id number] Date of Birth: 12-10-1929  [facility] CM/SW Contact:    Theresa Sam, LCSW Phone Number: 08/14/2021, 4:07 PM  Clinical Narrative:                  CSW notes patient is coming from [facility] for STR.  CSW spoke with patient's daughter Theresa Snyder who reports that patient will be home alone and she is unable to assist in caring for her high levels of needs at this time, would like for patient to return to [facility] for at least another week.   Tanya at [facility] notified.    Expected Discharge Plan: Skilled Nursing Facility Barriers to Discharge: Continued Medical Work up   Patient Goals and CMS Choice Patient states their goals for this hospitalization and ongoing recovery are:: to go home CMS Medicare.gov Compare Post Acute Care list provided to:: Patient Choice offered to / list presented to : Patient  Expected Discharge Plan and Services Expected Discharge Plan: Young arrangements for the past 2 months: Single Family Home                                      Prior Living Arrangements/Services Living arrangements for the past 2 months: Single Family Home Lives with:: Self                   Activities of Daily Living Home Assistive Devices/Equipment: Environmental consultant (specify type) ADL Screening (condition at time of admission) Patient's cognitive ability adequate to safely complete daily activities?: Yes Is the patient deaf or have difficulty hearing?: No Does the patient have difficulty seeing, even when wearing glasses/contacts?: No Does the patient have difficulty concentrating, remembering, or making decisions?: No Patient able to express need for assistance with ADLs?: Yes Does the patient have difficulty dressing or bathing?: No Independently performs ADLs?: Yes (appropriate for developmental age) Does the  patient have difficulty walking or climbing stairs?: No Weakness of Legs: None Weakness of Arms/Hands: None  Permission Sought/Granted                  Emotional Assessment              Admission diagnosis:  Pneumonia [J18.9] Elevated brain natriuretic peptide (BNP) level [R79.89] Acute dyspnea [R06.00] Acute congestive heart failure, unspecified heart failure type ([place]) [I50.9] Patient Active Problem List   Diagnosis Date Noted   Multifocal pneumonia 08/13/2021   Acute on chronic diastolic CHF (congestive heart failure) ([place]) 08/13/2021   Neuropathic pain 08/13/2021   Leukocytosis 08/13/2021   Pneumonia 08/13/2021   Acute dyspnea 08/12/2021   Lymphedema of leg    Hip fracture ([place]) 07/09/2021   Stage 3b chronic kidney disease (CKD) ([place]) 05/21/2021   Colonic obstruction ([place]) 05/21/2021   Large hiatal hernia 05/21/2021   Severe sepsis with lactic acidosis ([place]) 05/21/2021   Lumbar compression fracture ([place]) 03/24/2020   Fall 03/23/2020   L4 vertebral fracture ([place]) 03/23/2020   Chronic kidney disease 03/04/2015   Adaptive colitis 03/04/2015   BP (high blood pressure) 05/18/2013   Hypothyroidism 05/18/2013   Hyperlipidemia 05/18/2013   PCP:  [facility] [place]:   [id number] [address]  [place] [address] Phone: [phone] Fax: [phone]     Social Determinants of Health (SDOH) Interventions    Readmission Risk Interventions     No data to display

## 2021-08-14 NOTE — Plan of Care (Signed)

## 2021-08-14 NOTE — NC FL2 (Signed)
[place] LEVEL OF CARE SCREENING TOOL     IDENTIFICATION  Patient Name: Theresa Snyder Birthdate: [birth year] Sex: female Admission Date (Current Location): 08/12/2021  [facility] and [place] Number:  Engineering geologist and Address:  [facility], [address], [place], [place] [address]      Provider Number: [id number]  Attending Physician Name and Address:  Flora Lipps, MD  Relative Name and Phone Number:  [name] (daughter) [phone]    Current Level of Care: Hospital Recommended Level of Care: [place] Prior Approval Number:    Date Approved/Denied:   PASRR Number: [id number]  Discharge Plan: SNF    Current Diagnoses: Patient Active Problem List   Diagnosis Date Noted   Multifocal pneumonia 08/13/2021   Acute on chronic diastolic CHF (congestive heart failure) ([place]) 08/13/2021   Neuropathic pain 08/13/2021   Leukocytosis 08/13/2021   Pneumonia 08/13/2021   Acute dyspnea 08/12/2021   Lymphedema of leg    Hip fracture ([place]) 07/09/2021   Stage 3b chronic kidney disease (CKD) ([place]) 05/21/2021   Colonic obstruction ([place]) 05/21/2021   Large hiatal hernia 05/21/2021   Severe sepsis with lactic acidosis ([place]) 05/21/2021   Lumbar compression fracture ([place]) 03/24/2020   Fall 03/23/2020   L4 vertebral fracture ([place]) 03/23/2020   Chronic kidney disease 03/04/2015   Adaptive colitis 03/04/2015   BP (high blood pressure) 05/18/2013   Hypothyroidism 05/18/2013   Hyperlipidemia 05/18/2013    Orientation RESPIRATION BLADDER Height & Weight     Self, Time, Situation, Place  Normal Incontinent, External catheter Weight: 128 lb 4.9 oz (58.2 kg) Height:  5' (152.4 cm)  BEHAVIORAL SYMPTOMS/MOOD NEUROLOGICAL BOWEL NUTRITION STATUS      Incontinent Diet (see discharge summary)  AMBULATORY STATUS COMMUNICATION OF NEEDS Skin   Limited Assist Verbally Normal                       Personal Care Assistance  Level of Assistance  Bathing, Feeding, Dressing, Total care Bathing Assistance: Limited assistance Feeding assistance: Independent Dressing Assistance: Limited assistance Total Care Assistance: Limited assistance   Functional Limitations Info  Sight, Hearing, Speech Sight Info: Adequate Hearing Info: Adequate Speech Info: Adequate    SPECIAL CARE FACTORS FREQUENCY  PT (By licensed PT), OT (By licensed OT)     PT Frequency: min 4x weekly OT Frequency: min 4x weekly            Contractures Contractures Info: Not present    Additional Factors Info  Code Status, Allergies Code Status Info: full Allergies Info: Iodinated Contrast Media   Lorazepam   Risperidone   Sulfamethoxazole-trimethoprim           Current Medications (08/14/2021):  This is the current hospital active medication list Current Facility-Administered Medications  Medication Dose Route Frequency Provider Last Rate Last Admin   amLODipine (NORVASC) tablet 10 mg  10 mg Oral Q breakfast George Hugh, MD   10 mg at 08/14/21 0905   azithromycin (ZITHROMAX) tablet 500 mg  500 mg Oral Daily George Hugh, MD   500 mg at 08/14/21 0905   benazepril (LOTENSIN) tablet 10 mg  10 mg Oral Daily George Hugh, MD   10 mg at 08/14/21 0906   cefTRIAXone (ROCEPHIN) 1 g in sodium chloride 0.9 % 100 mL IVPB  1 g Intravenous Q24H George Hugh, MD   Stopped at 08/14/21 0906   enoxaparin (LOVENOX) injection 30 mg  30 mg Subcutaneous Q24H George Hugh,  MD   30 mg at 08/13/21 2055   furosemide (LASIX) injection 20 mg  20 mg Intravenous Q12H George Hugh, MD   20 mg at 08/14/21 0906   gabapentin (NEURONTIN) capsule 600 mg  600 mg Oral QHS George Hugh, MD   600 mg at 08/13/21 2054   HYDROcodone-acetaminophen (NORCO) 7.5-325 MG per tablet 1 tablet  1 tablet Oral Q6H PRN George Hugh, MD   1 tablet at 08/14/21 0905   ipratropium-albuterol (DUONEB) 0.5-2.5 (3) MG/3ML nebulizer solution 3 mL  3 mL Nebulization Q4H PRN Pokhrel,  Laxman, MD       levothyroxine (SYNTHROID) tablet 88 mcg  88 mcg Oral QAC breakfast George Hugh, MD   88 mcg at 08/14/21 0631   metoprolol tartrate (LOPRESSOR) tablet 25 mg  25 mg Oral BID George Hugh, MD   25 mg at 08/14/21 5809   multivitamin with minerals tablet 1 tablet  1 tablet Oral Daily George Hugh, MD   1 tablet at 08/14/21 0905   ondansetron (ZOFRAN) tablet 4 mg  4 mg Oral Q6H PRN George Hugh, MD       Or   ondansetron (ZOFRAN) injection 4 mg  4 mg Intravenous Q6H PRN George Hugh, MD       pravastatin (PRAVACHOL) tablet 20 mg  20 mg Oral QHS George Hugh, MD   20 mg at 08/13/21 2055   senna-docusate (Senokot-S) tablet 1 tablet  1 tablet Oral QHS PRN George Hugh, MD         Discharge Medications: Please see discharge summary for a list of discharge medications.  Relevant Imaging Results:  Relevant Lab Results:   Additional Information XIP:[id number]  Alberteen Sam, LCSW

## 2021-08-14 NOTE — Evaluation (Signed)
Occupational Therapy Evaluation Patient Details Name: Theresa Snyder MRN: [id number] DOB: 1929/11/10 Today's Date: 08/14/2021   History of Present Illness Pt is an 88 y.o. female presenting to hospital 7/4 with c/o SOB and cough; also (+) d-dimer.  Pt admitted with acute dyspnea, community acquired PNA, and acute on chronic diastolic heart failure.  PMH includes CKD, htn, lymphedema, syncope, seizures, spinal stenosis.  Of note, pt s/p L hip hemiarthroplasty 07/10/21; WBAT L LE.   Clinical Impression   Theresa Snyder presents with generalized weakness, limited endurance, and LLE hip pain with movement. Prior to entering SNF for rehab following L hip hemiarthroplasty 07/10/21, pt had been living alone, IND in ADL, with 2 children living nearby (son immediately next door), providing PRN assistance with IADL. Pt ambulates w/ a RW, reports 1 fall in previous 6 months. During today's evaluation, she is able to perform bed mobility, sit<>stand transfers, toileting, grooming in standing, all w/ Mod I-SUPV while maintaining good balance. Pt reports she no longer feels SOB, although O2 sats do drop to 86% w/ fxl mobility. Provided educ re: breathing techniques, with O2 sats increasing into low-mid 90s with seated rest break. Recommend ongoing OT while hospitalized, with DC home with HHOT and PRN support from children.     Recommendations for follow up therapy are one component of a multi-disciplinary discharge planning process, led by the attending physician.  Recommendations may be updated based on patient status, additional functional criteria and insurance authorization.   Follow Up Recommendations  Home health OT    Assistance Recommended at Discharge Intermittent Supervision/Assistance  Patient can return home with the following A little help with bathing/dressing/bathroom;Assistance with cooking/housework;Assist for transportation    Functional Status Assessment  Patient has had a recent decline in  their functional status and demonstrates the ability to make significant improvements in function in a reasonable and predictable amount of time.  Equipment Recommendations  None recommended by OT    Recommendations for Other Services       Precautions / Restrictions Precautions Precautions: Fall Restrictions Weight Bearing Restrictions: Yes LLE Weight Bearing: Weight bearing as tolerated      Mobility Bed Mobility Overal bed mobility: Modified Independent             General bed mobility comments: slightly increased time, effort    Transfers Overall transfer level: Needs assistance Equipment used: Rolling walker (2 wheels) Transfers: Sit to/from Stand Sit to Stand: Supervision           General transfer comment: steady transfer from bed, recliner, and toilet, using RW      Balance Overall balance assessment: Needs assistance Sitting-balance support: No upper extremity supported, Feet supported Sitting balance-Leahy Scale: Good Sitting balance - Comments: steady sitting reaching within BOS   Standing balance support: Bilateral upper extremity supported, During functional activity, No upper extremity supported Standing balance-Leahy Scale: Good Standing balance comment: able to perform grooming in standing without UE support                           ADL either performed or assessed with clinical judgement   ADL Overall ADL's : At baseline;Needs assistance/impaired     Grooming: Supervision/safety;Wash/dry hands;Oral care;Standing                   Toilet Transfer: Supervision/safety;Rolling walker (2 wheels);Grab Environmental education officer and Hygiene: Supervision/safety;Sitting/lateral lean  Vision         Perception     Praxis      Pertinent Vitals/Pain Pain Assessment Pain Score: 5  Pain Location: L hip, with movment Pain Descriptors / Indicators: Aching, Sore Pain  Intervention(s): Monitored during session, Repositioned     Hand Dominance Right   Extremity/Trunk Assessment Upper Extremity Assessment Upper Extremity Assessment: Overall WFL for tasks assessed   Lower Extremity Assessment Lower Extremity Assessment: Generalized weakness       Communication Communication Communication: No difficulties   Cognition Arousal/Alertness: Awake/alert Behavior During Therapy: WFL for tasks assessed/performed Overall Cognitive Status: Within Functional Limits for tasks assessed                                 General Comments: pleasant, cooperative, eager to participate in therapy session     General Comments       Exercises Other Exercises Other Exercises: Educ re: DC recs, home safety, balance   Shoulder Instructions      Home Living Family/patient expects to be discharged to:: Private residence Living Arrangements: Alone Available Help at Discharge: Family;Available PRN/intermittently Type of Home: Mobile home Home Access: Stairs to enter Entrance Stairs-Number of Steps: 4-5 Entrance Stairs-Rails: Right;Left;Can reach both Home Layout: One level     Bathroom Shower/Tub: Corporate investment banker: Handicapped height Bathroom Accessibility: No   Home Equipment: Rollator (4 wheels)   Additional Comments: Uses walker      Prior Functioning/Environment Prior Level of Function : Independent/Modified Independent             Mobility Comments: Ambulatory with RW; reports 1 fall in previous 6 months ADLs Comments: performs ADL INDly, using sponge bathing only. Does not drive, children assist with transport and shopping        OT Problem List: Decreased strength;Decreased range of motion;Decreased activity tolerance;Impaired balance (sitting and/or standing);Pain      OT Treatment/Interventions: Self-care/ADL training;Patient/family education;Therapeutic exercise;Balance training;Energy  conservation;Therapeutic activities;DME and/or AE instruction    OT Goals(Current goals can be found in the care plan section) Acute Rehab OT Goals Patient Stated Goal: to get back to her normal routine OT Goal Formulation: With patient Time For Goal Achievement: 08/28/21 Potential to Achieve Goals: Good ADL Goals Pt Will Perform Lower Body Dressing: with modified independence;sitting/lateral leans;sit to/from stand Pt Will Perform Toileting - Clothing Manipulation and hygiene: with modified independence;sitting/lateral leans;sit to/from stand Pt/caregiver will Perform Home Exercise Program: Increased strength;Increased ROM;Independently  OT Frequency: Min 2X/week    Co-evaluation              AM-PAC OT "6 Clicks" Daily Activity     Outcome Measure Help from another person eating meals?: None Help from another person taking care of personal grooming?: A Little Help from another person toileting, which includes using toliet, bedpan, or urinal?: A Little Help from another person bathing (including washing, rinsing, drying)?: A Little Help from another person to put on and taking off regular upper body clothing?: None Help from another person to put on and taking off regular lower body clothing?: A Little 6 Click Score: 20   End of Session Equipment Utilized During Treatment: Rolling walker (2 wheels)  Activity Tolerance: Patient tolerated treatment well Patient left: in chair;with call bell/phone within reach  OT Visit Diagnosis: Muscle weakness (generalized) (M62.81)                Time: 8127-5170  OT Time Calculation (min): 13 min Charges:  OT General Charges $OT Visit: 1 Visit OT Evaluation $OT Eval Low Complexity: 1 Low OT Treatments $Self Care/Home Management : 8-22 mins Josiah Lobo, PhD, MS, OTR/L 08/14/21, 2:16 PM

## 2021-08-14 NOTE — Evaluation (Signed)
Physical Therapy Evaluation Patient Details Name: Theresa Snyder MRN: [id number] DOB: [birth year] Today's Date: 08/14/2021  History of Present Illness  Pt is an 88 y.o. female presenting to hospital 7/4 with c/o SOB and cough; also (+) d-dimer.  Pt admitted with acute dyspnea, community acquired PNA, and acute on chronic diastolic heart failure.  PMH includes CKD, htn, lymphedema, syncope, seizures, spinal stenosis.  Of note, pt s/p L hip hemiarthroplasty 07/10/21; WBAT L LE.  Clinical Impression  Prior to hospital admission, pt was recently ambulating with RW (with assist) at rehab facility; normally lives alone in 1 level home with 4-5 STE B railings; has family support.  Currently pt is modified independent semi-supine to sitting edge of bed; SBA with transfers; and CGA progressing to SBA ambulating 80 feet with RW use.  Pt demonstrating mild SOB with ambulation with mild antalgic gait d/t L hip pain (pt kept on supplemental O2 per nursing request; pt's O2 sats 96% or greater on 3 L O2 via nasal cannula post ambulation and pt titrated down to 2 L O2 at rest end of session per nursing request).  Pt would benefit from skilled PT to address noted impairments and functional limitations (see below for any additional details).  Upon hospital discharge, pt would benefit from [facility] (anticipate pt able to discharge home with support instead of back to rehab; pt reports being agreeable to this and that she was going to discharge home soon from rehab).    Recommendations for follow up therapy are one component of a multi-disciplinary discharge planning process, led by the attending physician.  Recommendations may be updated based on patient status, additional functional criteria and insurance authorization.  Follow Up Recommendations Home health PT      Assistance Recommended at Discharge Set up Supervision/Assistance  Patient can return home with the following  A little help with  bathing/dressing/bathroom;Assistance with cooking/housework;Assist for transportation;Help with stairs or ramp for entrance    Equipment Recommendations Rolling walker (2 wheels);BSC/3in1 (youth sized)  Recommendations for Other Services  OT consult    Functional Status Assessment Patient has had a recent decline in their functional status and demonstrates the ability to make significant improvements in function in a reasonable and predictable amount of time.     Precautions / Restrictions Precautions Precautions: Fall Restrictions Weight Bearing Restrictions: Yes LLE Weight Bearing: Weight bearing as tolerated      Mobility  Bed Mobility Overal bed mobility: Modified Independent             General bed mobility comments: Semi-supine to sitting edge of bed with mild increased effort to perform on own    Transfers Overall transfer level: Needs assistance Equipment used: Rolling walker (2 wheels) Transfers: Sit to/from Stand Sit to Stand: Supervision           General transfer comment: steady transfer from bed using RW    Ambulation/Gait Ambulation/Gait assistance: Min guard, Supervision Gait Distance (Feet): 80 Feet Assistive device: Rolling walker (2 wheels)   Gait velocity: decreased     General Gait Details: mildly antalgic L LE; decreased stance time L LE; partial step through gait pattern; steady with RW use  Stairs            Wheelchair Mobility    Modified Rankin (Stroke Patients Only)       Balance Overall balance assessment: Needs assistance Sitting-balance support: No upper extremity supported, Feet supported Sitting balance-Leahy Scale: Good Sitting balance - Comments: steady sitting reaching within BOS  Standing balance support: Bilateral upper extremity supported, During functional activity Standing balance-Leahy Scale: Good Standing balance comment: steady ambulating with RW use                              Pertinent Vitals/Pain Pain Assessment Pain Assessment: 0-10 Pain Score: 5  Pain Location: L hip Pain Descriptors / Indicators: Aching, Sore Pain Intervention(s): Limited activity within patient's tolerance, Monitored during session, Repositioned, Patient requesting pain meds-RN notified HR 89-103 bpm during sessions activities.    Home Living Family/patient expects to be discharged to:: Private residence Living Arrangements: Alone Available Help at Discharge: Family;Available PRN/intermittently (pt's daughter and son) Type of Home: Mobile home Home Access: Stairs to enter Entrance Stairs-Rails: Right;Left;Can reach both Entrance Stairs-Number of Steps: 4-5   Home Layout: One level   Additional Comments: Uses walker    Prior Function Prior Level of Function : Independent/Modified Independent             Mobility Comments: Pt has been ambulatory with RW at rehab recently.  Prior to rehab stay, pt was ambulatory with walker at home (pt has 3 wheeled walker per prior therapy notes)       Hand Dominance        Extremity/Trunk Assessment   Upper Extremity Assessment Upper Extremity Assessment: Overall WFL for tasks assessed    Lower Extremity Assessment Lower Extremity Assessment: Generalized weakness    Cervical / Trunk Assessment Cervical / Trunk Assessment: Other exceptions Cervical / Trunk Exceptions: forward head/shoulders  Communication   Communication: No difficulties  Cognition Arousal/Alertness: Awake/alert Behavior During Therapy: WFL for tasks assessed/performed Overall Cognitive Status: Within Functional Limits for tasks assessed                                          General Comments  Nursing cleared pt for participation in physical therapy.  Pt agreeable to PT session.    Exercises  Transfer and gait training.   Assessment/Plan    PT Assessment Patient needs continued PT services  PT Problem List Decreased  strength;Decreased activity tolerance;Decreased balance;Decreased mobility;Cardiopulmonary status limiting activity;Pain       PT Treatment Interventions DME instruction;Gait training;Stair training;Functional mobility training;Therapeutic activities;Therapeutic exercise;Balance training;Patient/family education    PT Goals (Current goals can be found in the Care Plan section)  Acute Rehab PT Goals Patient Stated Goal: to go home PT Goal Formulation: With patient Time For Goal Achievement: 08/28/21 Potential to Achieve Goals: Good    Frequency Min 2X/week     Co-evaluation               AM-PAC PT "6 Clicks" Mobility  Outcome Measure Help needed turning from your back to your side while in a flat bed without using bedrails?: None Help needed moving from lying on your back to sitting on the side of a flat bed without using bedrails?: None Help needed moving to and from a bed to a chair (including a wheelchair)?: A Little Help needed standing up from a chair using your arms (e.g., wheelchair or bedside chair)?: A Little Help needed to walk in hospital room?: A Little Help needed climbing 3-5 steps with a railing? : A Little 6 Click Score: 20    End of Session Equipment Utilized During Treatment: Gait belt;Oxygen Activity Tolerance: Patient tolerated treatment well Patient left: in chair;with  call bell/phone within reach;with chair alarm set;with nursing/sitter in room Nurse Communication: Mobility status;Precautions;Other (comment) (pt's O2 sats during session and O2 use) PT Visit Diagnosis: Other abnormalities of gait and mobility (R26.89);Muscle weakness (generalized) (M62.81);History of falling (Z91.81);Pain Pain - Right/Left: Left Pain - part of body: Hip    Time: 5993-5701 PT Time Calculation (min) (ACUTE ONLY): 22 min   Charges:   PT Evaluation $PT Eval Low Complexity: 1 Low PT Treatments $Therapeutic Activity: 8-22 mins       Leitha Bleak, PT 08/14/21,  9:37 AM

## 2021-08-14 NOTE — Progress Notes (Addendum)
**Note Theresa Snyder** PROGRESS NOTE    Theresa Snyder  ZOX:[id number] DOB: [birth year] DOA: 08/12/2021 PCP: [facility]   Brief Narrative:  Theresa Snyder is a 88 y.o. female with past medical history of hypertension, hyperlipidemia, COPD, diastolic heart failure, CKD stage III, hypothyroidism who presented from [facility] to the hospital with worsening shortness of breath dyspnea and cough with yellow sputum production.  Dyspnea worse at rest and with exertion.  Patient normally ambulates with the help of a walker.  She stated that she did have pneumonia in the past and had similar symptoms this time.  In the ED patient was mildly tachypneic.  CT scan of the chest showed no evidence of pulmonary embolism but bilateral infiltrates.  He was then admitted hospital for multifocal pneumonia with acute on chronic diastolic heart failure  See below for additional details,   Assessment & Plan:   Principal Problem:   Acute dyspnea Active Problems:   Multifocal pneumonia   Leukocytosis   Acute on chronic diastolic CHF (congestive heart failure) (HCC)   Stage 3b chronic kidney disease (CKD) (HCC)   Neuropathic pain   Hyperlipidemia   Hypothyroidism   Pneumonia   Assessment and Plan:  Multifocal pneumonia Has cough and mild shortness of breath on nasal cannula oxygen.  We will continue to wean as able. CT chest negative for PE, but with mild to moderate bilateral multifocal infiltrates. continue Rocephin and Zithromax.MRSA PCR, urine strep, urine legionella, sputum cx pending.  Influenza and COVID was negative.  Blood cultures was negative in 2 days.  Acute on chronic diastolic CHF (congestive heart failure) (HCC) BNP more elevated than baseline.  CT scan of the chest with small bilateral pleural effusion.  2D echocardiogram with LV ejection fraction of 60 to 81% with diastolic grade 2 dysfunction.  Continue twice daily Lasix strict intake and output charting, daily weights.   Patient is negative balance for 3348m  Stage 3b chronic kidney disease (CKD) (HCC) Baseline creatinine appears to be 1.2-1.3.  Creatinine today at 1.4.  We will continue to monitor while on IV diuretics.  Neuropathic pain Continue gabapentin  Hyperlipidemia Continue statin  Hypothyroidism Continue synthroid  DVT prophylaxis: lovenox  Code Status: full  Family Communication: none  Disposition: SNF on 08/15/2021, will get PT evaluation  Status is: Inpatient Remains inpatient appropriate because: IV antibiotic, oxygen supplementation   Consultants:  none  Procedures:  2D echocardiogram  Antimicrobials:  Anti-infectives (From admission, onward)    Start     Dose/Rate Route Frequency Ordered Stop   08/12/21 2000  cefTRIAXone (ROCEPHIN) 1 g in sodium chloride 0.9 % 100 mL IVPB        1 g 200 mL/hr over 30 Minutes Intravenous Every 24 hours 08/12/21 1946 08/17/21 1959   08/12/21 2000  azithromycin (ZITHROMAX) tablet 500 mg        500 mg Oral Daily 08/12/21 1946 08/17/21 0959       Subjective: Today, patient was seen and examined at bedside.  Patient feels okay with breathing has mild cough.  Still on supplemental oxygen.  Denies use of oxygen at home.  Objective: Vitals:   08/14/21 0205 08/14/21 0500 08/14/21 0527 08/14/21 0746  BP:   112/63 (!) 114/53  Pulse:   (!) 105 93  Resp:   20 18  Temp:   (!) 97.5 F (36.4 C) 98.4 F (36.9 C)  TempSrc:      SpO2: 98%  98% 100%  Weight:  58.2 kg  Height:        Intake/Output Summary (Last 24 hours) at 08/14/2021 1324 Last data filed at 08/14/2021 0906 Gross per 24 hour  Intake 90.5 ml  Output --  Net 90.5 ml    Filed Weights   08/12/21 1455 08/14/21 0500  Weight: 54.4 kg 58.2 kg    Physical examination: General:  Average built, not in obvious distress, on nasal cannula oxygen HENT:   No scleral pallor or icterus noted. Oral mucosa is moist.  Chest:   Diminished breath sounds bilaterally.  Mild crackles at the  bases CVS: S1 &S2 heard. No murmur.  Regular rate and rhythm. Abdomen: Soft, nontender, nondistended.  Bowel sounds are heard.   Extremities: No cyanosis, clubbing with bilateral chronic lower extremity edema..  Peripheral pulses are palpable. Psych: Alert, awake and oriented, normal mood CNS:  No cranial nerve deficits.  Power equal in all extremities.   Skin: Warm and dry.  No rashes noted.  Data Reviewed: I have personally reviewed following labs and imaging studies  CBC: Recent Labs  Lab 08/12/21 1513 08/13/21 0500 08/14/21 0500  WBC 16.3* 14.9* 12.7*  NEUTROABS 12.9*  --  8.7*  HGB 9.5* 9.2* 9.7*  HCT 29.7* 27.9* 29.7*  MCV 98.0 96.5 95.5  PLT 428* 437* 431*     Basic Metabolic Panel: Recent Labs  Lab 08/12/21 1513 08/13/21 0500 08/14/21 0500  NA 131* 134* 134*  K 4.8 4.4 3.5  CL 98 99 99  CO2 '24 28 28  '$ GLUCOSE 136* 99 94  BUN 31* 28* 26*  CREATININE 1.45* 1.31* 1.41*  CALCIUM 8.9 8.7* 8.3*  MG  --   --  1.5*  PHOS  --   --  4.1     GFR: Estimated Creatinine Clearance: 21.6 mL/min (A) (by C-G formula based on SCr of 1.41 mg/dL (H)).  Liver Function Tests: Recent Labs  Lab 08/12/21 1513 08/14/21 0500  AST 20 17  ALT 12 10  ALKPHOS 111 85  BILITOT 0.6 0.4  PROT 6.9 6.0*  ALBUMIN 3.1* 2.7*     CBG: No results for input(s): "GLUCAP" in the last 168 hours.   Recent Results (from the past 240 hour(s))  Blood culture (routine x 2)     Status: None (Preliminary result)   Collection Time: 08/12/21  3:17 PM   Specimen: BLOOD  Result Value Ref Range Status   Specimen Description BLOOD BLOOD LEFT FOREARM  Final   Special Requests   Final    BOTTLES DRAWN AEROBIC AND ANAEROBIC Blood Culture adequate volume   Culture   Final    NO GROWTH 2 DAYS Performed at [facility], [address] [place], [place] [address]    Report Status PENDING  Incomplete  Blood culture (routine x 2)     Status: None (Preliminary result)   Collection Time:  08/12/21  3:41 PM   Specimen: BLOOD  Result Value Ref Range Status   Specimen Description BLOOD RIGHT ASSIST CONTROL  Final   Special Requests   Final    BOTTLES DRAWN AEROBIC AND ANAEROBIC Blood Culture adequate volume   Culture   Final    NO GROWTH 2 DAYS Performed at [facility], [address], [place], [place] [address]    Report Status PENDING  Incomplete  SARS Coronavirus 2 by RT PCR (hospital order, performed in [facility] hospital lab) *cepheid single result test* Anterior Nasal Swab     Status: None   Collection Time: 08/12/21  3:41 PM   Specimen: Anterior Nasal Swab  Result Value Ref Range Status   SARS Coronavirus 2 by RT PCR NEGATIVE NEGATIVE Final    Comment: (NOTE) SARS-CoV-2 target nucleic acids are NOT DETECTED.  The SARS-CoV-2 RNA is generally detectable in upper and lower respiratory specimens during the acute phase of infection. The lowest concentration of SARS-CoV-2 viral copies this assay can detect is 250 copies / mL. A negative result does not preclude SARS-CoV-2 infection and should not be used as the sole basis for treatment or other patient management decisions.  A negative result may occur with improper specimen collection / handling, submission of specimen other than nasopharyngeal swab, presence of viral mutation(s) within the areas targeted by this assay, and inadequate number of viral copies (<250 copies / mL). A negative result must be combined with clinical observations, patient history, and epidemiological information.  Fact Sheet for Patients:   https://www.patel.info/  Fact Sheet for Healthcare Providers: https://hall.com/  This test is not yet approved or  cleared by the [place] FDA and has been authorized for detection and/or diagnosis of SARS-CoV-2 by FDA under an Emergency Use Authorization (EUA).  This EUA will remain in effect (meaning this test can be used) for the duration  of the COVID-19 declaration under Section 564(b)(1) of the Act, 21 U.S.C. section 360bbb-3(b)(1), unless the authorization is terminated or revoked sooner.  Performed at [facility], [address], [place], [place] [address]          Radiology Studies: ECHOCARDIOGRAM COMPLETE  Result Date: 08/13/2021    ECHOCARDIOGRAM REPORT   Patient Name:   LARENE ASCENCIO Date of Exam: 08/13/2021 Medical Rec #:  [id number]       Height:       60.0 in Accession #:    [id number]      Weight:       120.0 lb Date of Birth:  [birth year]      BSA:          1.502 m Patient Age:    82 years        BP:           115/88 mmHg Patient Gender: F               HR:           88 bpm. Exam Location:  [facility] Procedure: 2D Echo, Color Doppler and Cardiac Doppler Indications:     R00.8 Other abnormalities of the heart  History:         Patient has no prior history of Echocardiogram examinations.                  CKD; Risk Factors:Hypertension and HCL.  Sonographer:     Charmayne Sheer Referring Phys:  [id number] CALDWELL POWELL JR Diagnosing Phys: Donnelly Angelica IMPRESSIONS  1. Left ventricular ejection fraction, by estimation, is 60 to 65%. The left ventricle has normal function. The left ventricle has no regional wall motion abnormalities. Left ventricular diastolic parameters are consistent with Grade II diastolic dysfunction (pseudonormalization).  2. Right ventricular systolic function is normal. The right ventricular size is mildly enlarged.  3. Left atrial size was mildly dilated.  4. The mitral valve is degenerative. No evidence of mitral valve regurgitation. Mild mitral stenosis. The mean mitral valve gradient is 5.0 mmHg.  5. The aortic valve is normal in structure. Aortic valve regurgitation is not visualized. Aortic valve sclerosis is present, with no evidence of aortic valve  stenosis. FINDINGS  Left Ventricle: Left ventricular ejection fraction, by estimation, is 60 to 65%. The left ventricle has normal function. The left  ventricle has no regional wall motion abnormalities. The left ventricular internal cavity size was small. There is no left ventricular hypertrophy. Left ventricular diastolic parameters are consistent with Grade II diastolic dysfunction (pseudonormalization). Right Ventricle: The right ventricular size is mildly enlarged. Right vetricular wall thickness was not well visualized. Right ventricular systolic function is normal. Left Atrium: Left atrial size was mildly dilated. Right Atrium: Right atrial size was normal in size. Pericardium: There is no evidence of pericardial effusion. Mitral Valve: The mitral valve is degenerative in appearance. Mild mitral annular calcification. No evidence of mitral valve regurgitation. Mild mitral valve stenosis. MV peak gradient, 13.7 mmHg. The mean mitral valve gradient is 5.0 mmHg. Tricuspid Valve: The tricuspid valve is normal in structure. Tricuspid valve regurgitation is mild. Aortic Valve: The aortic valve is normal in structure. Aortic valve regurgitation is not visualized. Aortic valve sclerosis is present, with no evidence of aortic valve stenosis. Aortic valve mean gradient measures 8.0 mmHg. Aortic valve peak gradient measures 16.2 mmHg. Aortic valve area, by VTI measures 1.98 cm. Pulmonic Valve: The pulmonic valve was not well visualized. Pulmonic valve regurgitation is not visualized. No evidence of pulmonic stenosis. Aorta: The aortic root is normal in size and structure. Venous: The inferior vena cava was not well visualized. IAS/Shunts: The interatrial septum was not well visualized.  LEFT VENTRICLE PLAX 2D LVIDd:         2.88 cm   Diastology LVIDs:         2.06 cm   LV e' medial:    5.11 cm/s LV PW:         0.98 cm   LV E/e' medial:  23.1 LV IVS:        0.74 cm   LV e' lateral:   5.66 cm/s LVOT diam:     1.80 cm   LV E/e' lateral: 20.8 LV SV:         72 LV SV Index:   48 LVOT Area:     2.54 cm  RIGHT VENTRICLE RV Basal diam:  2.69 cm RV Mid diam:    2.65 cm TAPSE  (M-mode): 2.7 cm LEFT ATRIUM             Index        RIGHT ATRIUM           Index LA diam:        3.00 cm 2.00 cm/m   RA Area:     11.30 cm LA Vol (A2C):   32.2 ml 21.43 ml/m  RA Volume:   21.30 ml  14.18 ml/m LA Vol (A4C):   59.7 ml 39.74 ml/m LA Biplane Vol: 43.7 ml 29.09 ml/m  AORTIC VALVE                     PULMONIC VALVE AV Area (Vmax):    1.75 cm      PV Vmax:       1.39 m/s AV Area (Vmean):   1.85 cm      PV Peak grad:  7.7 mmHg AV Area (VTI):     1.98 cm AV Vmax:           201.00 cm/s AV Vmean:          132.000 cm/s AV VTI:            0.362  m AV Peak Grad:      16.2 mmHg AV Mean Grad:      8.0 mmHg LVOT Vmax:         138.00 cm/s LVOT Vmean:        96.100 cm/s LVOT VTI:          0.281 m LVOT/AV VTI ratio: 0.78  AORTA Ao Root diam: 3.00 cm MITRAL VALVE MV Area (PHT): 4.04 cm     SHUNTS MV Area VTI:   1.75 cm     Systemic VTI:  0.28 m MV Peak grad:  13.7 mmHg    Systemic Diam: 1.80 cm MV Mean grad:  5.0 mmHg MV Vmax:       1.85 m/s MV Vmean:      99.8 cm/s MV Decel Time: 188 msec MV E velocity: 118.00 cm/s MV A velocity: 188.00 cm/s MV E/A ratio:  0.63 Donnelly Angelica Electronically signed by Donnelly Angelica Signature Date/Time: 08/13/2021/5:27:00 PM    Final    CT Angio Chest PE W and/or Wo Contrast  Result Date: 08/12/2021 CLINICAL DATA:  Shortness of breath and elevated D-dimer. EXAM: CT ANGIOGRAPHY CHEST WITH CONTRAST TECHNIQUE: Multidetector CT imaging of the chest was performed using the standard protocol during bolus administration of intravenous contrast. Multiplanar CT image reconstructions and MIPs were obtained to evaluate the vascular anatomy. RADIATION DOSE REDUCTION: This exam was performed according to the departmental dose-optimization program which includes automated exposure control, adjustment of the mA and/or kV according to patient size and/or use of iterative reconstruction technique. CONTRAST:  86m OMNIPAQUE IOHEXOL 350 MG/ML SOLN COMPARISON:  March 23, 2020 FINDINGS:  Cardiovascular: There is marked severity calcification of the aortic arch, without evidence of aortic aneurysm or dissection. Satisfactory opacification of the pulmonary arteries to the segmental level. No evidence of pulmonary embolism. Normal heart size with moderate to marked severity coronary artery calcification. No pericardial effusion. Mediastinum/Nodes: No enlarged mediastinal, hilar, or axillary lymph nodes. Thyroid gland, trachea, and esophagus demonstrate no significant findings. A very large hiatal hernia is seen. This contains the entire stomach and a large portion of large bowel. Lungs/Pleura: Mild to moderate severity bilateral multifocal infiltrates are seen. Mild bibasilar atelectasis is also noted. Small bilateral pleural effusions are seen. There is no evidence of a pneumothorax. Upper Abdomen: Very large hiatal hernia, as described above. Musculoskeletal: Multilevel degenerative changes are seen throughout the thoracic spine. Review of the MIP images confirms the above findings. IMPRESSION: 1. No evidence of pulmonary embolism. 2. Mild to moderate severity bilateral multifocal infiltrates. 3. Small bilateral pleural effusions. 4. Very large hiatal hernia. Aortic Atherosclerosis (ICD10-I70.0). Electronically Signed   By: TVirgina NorfolkM.D.   On: 08/12/2021 19:03   UK[place]Venous Img Lower Bilateral  Result Date: 08/12/2021 CLINICAL DATA:  Lower extremity swelling. EXAM: BILATERAL LOWER EXTREMITY VENOUS DOPPLER ULTRASOUND TECHNIQUE: Gray-scale sonography with compression, as well as color and duplex ultrasound, were performed to evaluate the deep venous system(s) from the level of the common femoral vein through the popliteal and proximal calf veins. COMPARISON:  Remote exam 10/14/2012 FINDINGS: VENOUS Normal compressibility of the common femoral, superficial femoral, and popliteal veins, as well as the visualized calf veins. Visualized portions of profunda femoral vein and great saphenous vein  unremarkable. No filling defects to suggest DVT on grayscale or color Doppler imaging. Doppler waveforms show normal direction of venous flow, normal respiratory plasticity and response to augmentation. OTHER Left popliteal fluid collection measuring 5.5 x 0.8 x 1.7 cm, avascular, typically Baker  cyst. Limitations: none IMPRESSION: 1. No evidence of bilateral lower extremity DVT. 2. Left-sided Baker cyst. Electronically Signed   By: Keith Rake M.D.   On: 08/12/2021 17:24   DG Chest Portable 1 View  Result Date: 08/12/2021 CLINICAL DATA:  SOB EXAM: PORTABLE CHEST 1 VIEW COMPARISON:  Chest x-ray 05/09/2021, CT chest 03/23/2020 FINDINGS: Large hiatal hernia. The heart and mediastinal contours are unchanged. Aortic calcification. Biapical pleural/pulmonary scarring. Opacification of the left lower lung zone likely due to atelectasis and superimpose large hiatal hernia. No focal consolidation. Increased central markings. Likely trace bilateral pleural effusions. No pneumothorax. No acute osseous abnormality. IMPRESSION: 1. Increased interstitial markings which could represent bronchitic changes versus pulmonary edema. 2. Likely query trace bilateral pleural effusions. 3. Large hiatal hernia. 4.  Aortic Atherosclerosis (ICD10-I70.0). Electronically Signed   By: Iven Finn M.D.   On: 08/12/2021 15:48      Scheduled Meds:  amLODipine  10 mg Oral Q breakfast   azithromycin  500 mg Oral Daily   benazepril  10 mg Oral Daily   enoxaparin (LOVENOX) injection  30 mg Subcutaneous Q24H   furosemide  20 mg Intravenous Q12H   gabapentin  600 mg Oral QHS   levothyroxine  88 mcg Oral QAC breakfast   metoprolol tartrate  25 mg Oral BID   multivitamin with minerals  1 tablet Oral Daily   pravastatin  20 mg Oral QHS   Continuous Infusions:  cefTRIAXone (ROCEPHIN)  IV Stopped (08/14/21 0906)     LOS: 2 days    Flora Lipps, MD [facility] 08/14/2021, 1:24 PM

## 2021-08-15 DIAGNOSIS — E782 Mixed hyperlipidemia: Secondary | ICD-10-CM | POA: Diagnosis not present

## 2021-08-15 DIAGNOSIS — I5033 Acute on chronic diastolic (congestive) heart failure: Secondary | ICD-10-CM | POA: Diagnosis not present

## 2021-08-15 DIAGNOSIS — D72829 Elevated white blood cell count, unspecified: Secondary | ICD-10-CM | POA: Diagnosis not present

## 2021-08-15 DIAGNOSIS — R0600 Dyspnea, unspecified: Secondary | ICD-10-CM | POA: Diagnosis not present

## 2021-08-15 LAB — CBC
HCT: 29.7 % — ABNORMAL LOW (ref 36.0–46.0)
Hemoglobin: 9.6 g/dL — ABNORMAL LOW (ref 12.0–15.0)
MCH: 31.3 pg (ref 26.0–34.0)
MCHC: 32.3 g/dL (ref 30.0–36.0)
MCV: 96.7 fL (ref 80.0–100.0)
Platelets: 477 10*3/uL — ABNORMAL HIGH (ref 150–400)
RBC: 3.07 MIL/uL — ABNORMAL LOW (ref 3.87–5.11)
RDW: 12.5 % (ref 11.5–15.5)
WBC: 9.5 10*3/uL (ref 4.0–10.5)
nRBC: 0 % (ref 0.0–0.2)

## 2021-08-15 LAB — BASIC METABOLIC PANEL
Anion gap: 9 (ref 5–15)
BUN: 24 mg/dL — ABNORMAL HIGH (ref 8–23)
CO2: 27 mmol/L (ref 22–32)
Calcium: 7.9 mg/dL — ABNORMAL LOW (ref 8.9–10.3)
Chloride: 98 mmol/L (ref 98–111)
Creatinine, Ser: 1.5 mg/dL — ABNORMAL HIGH (ref 0.44–1.00)
GFR, Estimated: 33 mL/min — ABNORMAL LOW (ref >60)
Glucose, Bld: 88 mg/dL (ref 70–99)
Potassium: 3.9 mmol/L (ref 3.5–5.1)
Sodium: 134 mmol/L — ABNORMAL LOW (ref 135–145)

## 2021-08-15 LAB — MAGNESIUM: Magnesium: 1.4 mg/dL — ABNORMAL LOW (ref 1.7–2.4)

## 2021-08-15 MED ORDER — HYDROCODONE-ACETAMINOPHEN 7.5-325 MG PO TABS
1.0000 | ORAL_TABLET | Freq: Four times a day (QID) | ORAL | 0 refills | Status: DC | PRN
Start: 2021-08-15 — End: 2021-11-01

## 2021-08-15 MED ORDER — MAGNESIUM OXIDE -MG SUPPLEMENT 400 (240 MG) MG PO TABS
400.0000 mg | ORAL_TABLET | Freq: Two times a day (BID) | ORAL | Status: DC
  Administered 2021-08-15: 400 mg via ORAL
  Filled 2021-08-15: qty 1

## 2021-08-15 MED ORDER — AZITHROMYCIN 250 MG PO TABS: 250.0000 mg | ORAL_TABLET | Freq: Every day | ORAL | 0 refills | Status: AC

## 2021-08-15 MED ORDER — FUROSEMIDE 20 MG PO TABS: 20.0000 mg | ORAL_TABLET | Freq: Every day | ORAL | Status: DC

## 2021-08-15 MED ORDER — MAGNESIUM OXIDE -MG SUPPLEMENT 400 (240 MG) MG PO TABS: 400.0000 mg | ORAL_TABLET | Freq: Two times a day (BID) | ORAL | 0 refills | Status: AC

## 2021-08-15 MED ORDER — CEFDINIR 300 MG PO CAPS: 300.0000 mg | ORAL_CAPSULE | Freq: Every day | ORAL | 0 refills | Status: AC

## 2021-08-15 MED ORDER — ALBUTEROL SULFATE HFA 108 (90 BASE) MCG/ACT IN AERS: 2.0000 | INHALATION_SPRAY | Freq: Four times a day (QID) | RESPIRATORY_TRACT | 2 refills | Status: DC | PRN

## 2021-08-15 NOTE — Plan of Care (Signed)

## 2021-08-15 NOTE — Care Management Important Message (Signed)
Important Message  Patient Details  Name: Theresa Snyder MRN: [id number] Date of Birth: [birth year]   Medicare Important Message Given:  Yes     Dannette Barbara 08/15/2021, 10:30 AM

## 2021-08-15 NOTE — TOC Progression Note (Addendum)
Transition of Care [facility]) - Progression Note    Patient Details  Name: Theresa Snyder MRN: [id number] Date of Birth: 1929-08-14  Transition of Care [facility]) CM/SW [place], RN Phone Number: 08/15/2021, 9:29 AM  Clinical Narrative:     Spoke with the patient's daughter Theresa Snyder and let her know that the patient will go to [facility] room 59 today, Bedside nurse to call report to [phone] Theresa Snyder her daughter will call her brother the patient's son to transport  Expected Discharge Plan: Skilled Nursing Facility Barriers to Discharge: Continued Medical Work up  Expected Discharge Plan and Services Expected Discharge Plan: [place] arrangements for the past 2 months: Single Family Home Expected Discharge Date: 08/15/21                                     Social Determinants of Health (SDOH) Interventions    Readmission Risk Interventions     No data to display

## 2021-08-15 NOTE — Progress Notes (Signed)
Patient Discharge  Patient being discharged back to her previous facility. IV removed without difficulty, with no redness or swelling noted at the site. Discharge instructions reviewed with the patient and her nephew. They verbalize understanding of instructions. Patient satisfied that all belongings have been returned.

## 2021-08-15 NOTE — Progress Notes (Signed)
Called twice to give facility report, and they did not answer.

## 2021-08-15 NOTE — Discharge Summary (Signed)
Physician Discharge Summary   Patient: Theresa Snyder MRN: [id number] DOB: [birth year]  Admit date:     08/12/2021  Discharge date: 08/15/21  Discharge Physician: Flora Lipps   PCP: [facility] [place]   Recommendations at discharge:   Follow-up with primary care provider at the skilled nursing facility in 3 to 5 days.   Check CBC BMP magnesium and LFT in the next visit. Recommend x-ray of the chest in 4 to 6 weeks to ensure resolution. Patient has been prescribed Lasix instead of hydrochlorothiazide at this time We will need fluid restriction at 1500 MLS per day, and low-salt diet on discharge  Discharge Diagnoses: Principal Problem:   Acute dyspnea Active Problems:   Multifocal pneumonia   Leukocytosis   Acute on chronic diastolic CHF (congestive heart failure) (HCC)   Stage 3b chronic kidney disease (CKD) (HCC)   Neuropathic pain   Hyperlipidemia   Hypothyroidism   Pneumonia  Resolved Problems:   * No resolved hospital problems. *  Hospital Course: Theresa Snyder is a 88 y.o. female with past medical history of hypertension, hyperlipidemia, COPD, diastolic heart failure, CKD stage III, hypothyroidism who presented from [facility] to the hospital with worsening shortness of breath dyspnea and cough with yellow sputum production.  Dyspnea worse at rest and with exertion.  Patient normally ambulates with the help of a walker.  She stated that she did have pneumonia in the past and had similar symptoms this time.  In the ED, patient was mildly tachypneic.  CT scan of the chest showed no evidence of pulmonary embolism but bilateral infiltrates. Patient was then admitted hospital for multifocal pneumonia with acute on chronic diastolic heart failure  Following conditions were addressed during hospitalization,  Multifocal pneumonia  CT chest negative for PE, but with mild to moderate bilateral multifocal infiltrates. continue Rocephin and  Zithromax.MRSA PCR, urine strep, urine legionella, sputum cx pending.  Influenza and COVID was negative.  Blood cultures was negative in 2 days.  Patient has clinically improved at this time.  Was initially on supplemental oxygen which has been discontinued.  Patient will continue Omnicef and Zithromax for next 3 days to complete 5-day course of antibiotic.   Acute on chronic diastolic CHF (congestive heart failure) (HCC) BNP more elevated than baseline.  CT scan of the chest with small bilateral pleural effusion.  2D echocardiogram with LV ejection fraction of 60 to 67% with diastolic grade 2 dysfunction.  Patient received Lasix IV twice daily while in the hospital.  Will be transition to Lasix oral daily on discharge..  We will discontinue HCTZ from home.  Patient will be recommended low-salt diet and fluid restriction of 1500 mL/day..  Patient was negative balance for 3260 mL prior to discharge per  Stage 3b chronic kidney disease (CKD) ([place]) Baseline creatinine appears to be 1.2-1.3.  Creatinine today at 1.5.  Will need to monitor BMP as outpatient.  Neuropathic pain Continue gabapentin   Hyperlipidemia Continue statin   Hypothyroidism Continue synthroid  Consultants: None Procedures performed: None Disposition: Skilled nursing facility Diet recommendation:  Discharge Diet Orders (From admission, onward)     Start     Ordered   08/15/21 0000  Diet - low sodium heart healthy       Comments: 1500 mL fluid restriction.   08/15/21 0917           Cardiac diet DISCHARGE MEDICATION: Allergies as of 08/15/2021       Reactions   Iodinated  Contrast Media Other (See Comments)   Blue, Green and Red dyes    Lorazepam    Altered mental state   Risperidone Other (See Comments)   Altered mental state    Sulfamethoxazole-trimethoprim    Hyper        Medication List     STOP taking these medications    enoxaparin 40 MG/0.4ML injection Commonly known as: LOVENOX    hydrochlorothiazide 25 MG tablet Commonly known as: HYDRODIURIL       TAKE these medications    albuterol 108 (90 Base) MCG/ACT inhaler Commonly known as: VENTOLIN HFA Inhale 2 puffs into the lungs every 6 (six) hours as needed for wheezing or shortness of breath.   amLODipine 10 MG tablet Commonly known as: NORVASC Take 10 mg by mouth daily with breakfast. for high blood pressure   azithromycin 250 MG tablet Commonly known as: ZITHROMAX Take 1 tablet (250 mg total) by mouth daily for 3 days.   benazepril 10 MG tablet Commonly known as: LOTENSIN Take 1 tablet (10 mg total) by mouth daily.   cefdinir 300 MG capsule Commonly known as: OMNICEF Take 1 capsule (300 mg total) by mouth daily for 3 days.   docusate sodium 100 MG capsule Commonly known as: COLACE Take 1 capsule (100 mg total) by mouth 2 (two) times daily as needed for mild constipation.   feeding supplement Liqd Take 237 mLs by mouth 3 (three) times daily between meals.   furosemide 20 MG tablet Commonly known as: Lasix Take 1 tablet (20 mg total) by mouth daily after breakfast.   gabapentin 300 MG capsule Commonly known as: NEURONTIN Take 2 capsules (600 mg total) by mouth at bedtime.   HYDROcodone-acetaminophen 7.5-325 MG tablet Commonly known as: NORCO Take 1 tablet by mouth every 6 (six) hours as needed for severe pain (pain score 7-10). What changed: how much to take   levothyroxine 88 MCG tablet Commonly known as: SYNTHROID Take 88 mcg by mouth daily before breakfast.   magnesium oxide 400 (240 Mg) MG tablet Commonly known as: MAG-OX Take 1 tablet (400 mg total) by mouth 2 (two) times daily for 10 days.   metoprolol tartrate 25 MG tablet Commonly known as: LOPRESSOR Take 1 tablet (25 mg total) by mouth 2 (two) times daily.   multivitamin with minerals Tabs tablet Take 1 tablet by mouth daily.   pravastatin 20 MG tablet Commonly known as: PRAVACHOL Take 20 mg by mouth at bedtime.          Subjective Today, patient was seen and examined at bedside.  Feels better with breathing.  Denies any pain, nausea, vomiting, fever or chills  Discharge Exam: Filed Weights   08/12/21 1455 08/14/21 0500 08/15/21 0353  Weight: 54.4 kg 58.2 kg 59 kg      08/15/2021    7:52 AM 08/15/2021    3:53 AM 08/15/2021    2:41 AM  Vitals with BMI  Weight  130 lbs 1 oz   BMI  37.9   Systolic 024  097  Diastolic 62  54  Pulse 82  82    General:  Average built, not in obvious distress HENT:   No scleral pallor or icterus noted. Oral mucosa is moist.  Chest:    Diminished breath sounds bilaterally. No crackles or wheezes.  CVS: S1 &S2 heard. No murmur.  Regular rate and rhythm. Abdomen: Soft, nontender, nondistended.  Bowel sounds are heard.   Extremities: No cyanosis, clubbing with bilateral lower extremity chronic  edema.  Peripheral pulses are palpable. Psych: Alert, awake and oriented, normal mood CNS:  No cranial nerve deficits.  Power equal in all extremities.   Skin: Warm and dry.  No rashes noted.   Condition at discharge: good  The results of significant diagnostics from this hospitalization (including imaging, microbiology, ancillary and laboratory) are listed below for reference.   Imaging Studies: ECHOCARDIOGRAM COMPLETE  Result Date: 08/13/2021    ECHOCARDIOGRAM REPORT   Patient Name:   CHARMAINE PLACIDO Date of Exam: 08/13/2021 Medical Rec #:  [id number]       Height:       60.0 in Accession #:    [id number]      Weight:       120.0 lb Date of Birth:  March 02, 1929      BSA:          1.502 m Patient Age:    42 years        BP:           115/88 mmHg Patient Gender: F               HR:           88 bpm. Exam Location:  [facility] Procedure: 2D Echo, Color Doppler and Cardiac Doppler Indications:     R00.8 Other abnormalities of the heart  History:         Patient has no prior history of Echocardiogram examinations.                  CKD; Risk Factors:Hypertension and HCL.  Sonographer:     Charmayne Sheer Referring Phys:  [id number] A CALDWELL POWELL JR Diagnosing Phys: Donnelly Angelica IMPRESSIONS  1. Left ventricular ejection fraction, by estimation, is 60 to 65%. The left ventricle has normal function. The left ventricle has no regional wall motion abnormalities. Left ventricular diastolic parameters are consistent with Grade II diastolic dysfunction (pseudonormalization).  2. Right ventricular systolic function is normal. The right ventricular size is mildly enlarged.  3. Left atrial size was mildly dilated.  4. The mitral valve is degenerative. No evidence of mitral valve regurgitation. Mild mitral stenosis. The mean mitral valve gradient is 5.0 mmHg.  5. The aortic valve is normal in structure. Aortic valve regurgitation is not visualized. Aortic valve sclerosis is present, with no evidence of aortic valve stenosis. FINDINGS  Left Ventricle: Left ventricular ejection fraction, by estimation, is 60 to 65%. The left ventricle has normal function. The left ventricle has no regional wall motion abnormalities. The left ventricular internal cavity size was small. There is no left ventricular hypertrophy. Left ventricular diastolic parameters are consistent with Grade II diastolic dysfunction (pseudonormalization). Right Ventricle: The right ventricular size is mildly enlarged. Right vetricular wall thickness was not well visualized. Right ventricular systolic function is normal. Left Atrium: Left atrial size was mildly dilated. Right Atrium: Right atrial size was normal in size. Pericardium: There is no evidence of pericardial effusion. Mitral Valve: The mitral valve is degenerative in appearance. Mild mitral annular calcification. No evidence of mitral valve regurgitation. Mild mitral valve stenosis. MV peak gradient, 13.7 mmHg. The mean mitral valve gradient is 5.0 mmHg. Tricuspid Valve: The tricuspid valve is normal in structure. Tricuspid valve regurgitation is mild. Aortic Valve: The aortic valve is normal in  structure. Aortic valve regurgitation is not visualized. Aortic valve sclerosis is present, with no evidence of aortic valve stenosis. Aortic valve mean gradient measures 8.0 mmHg. Aortic valve peak gradient measures 16.2  mmHg. Aortic valve area, by VTI measures 1.98 cm. Pulmonic Valve: The pulmonic valve was not well visualized. Pulmonic valve regurgitation is not visualized. No evidence of pulmonic stenosis. Aorta: The aortic root is normal in size and structure. Venous: The inferior vena cava was not well visualized. IAS/Shunts: The interatrial septum was not well visualized.  LEFT VENTRICLE PLAX 2D LVIDd:         2.88 cm   Diastology LVIDs:         2.06 cm   LV e' medial:    5.11 cm/s LV PW:         0.98 cm   LV E/e' medial:  23.1 LV IVS:        0.74 cm   LV e' lateral:   5.66 cm/s LVOT diam:     1.80 cm   LV E/e' lateral: 20.8 LV SV:         72 LV SV Index:   48 LVOT Area:     2.54 cm  RIGHT VENTRICLE RV Basal diam:  2.69 cm RV Mid diam:    2.65 cm TAPSE (M-mode): 2.7 cm LEFT ATRIUM             Index        RIGHT ATRIUM           Index LA diam:        3.00 cm 2.00 cm/m   RA Area:     11.30 cm LA Vol (A2C):   32.2 ml 21.43 ml/m  RA Volume:   21.30 ml  14.18 ml/m LA Vol (A4C):   59.7 ml 39.74 ml/m LA Biplane Vol: 43.7 ml 29.09 ml/m  AORTIC VALVE                     PULMONIC VALVE AV Area (Vmax):    1.75 cm      PV Vmax:       1.39 m/s AV Area (Vmean):   1.85 cm      PV Peak grad:  7.7 mmHg AV Area (VTI):     1.98 cm AV Vmax:           201.00 cm/s AV Vmean:          132.000 cm/s AV VTI:            0.362 m AV Peak Grad:      16.2 mmHg AV Mean Grad:      8.0 mmHg LVOT Vmax:         138.00 cm/s LVOT Vmean:        96.100 cm/s LVOT VTI:          0.281 m LVOT/AV VTI ratio: 0.78  AORTA Ao Root diam: 3.00 cm MITRAL VALVE MV Area (PHT): 4.04 cm     SHUNTS MV Area VTI:   1.75 cm     Systemic VTI:  0.28 m MV Peak grad:  13.7 mmHg    Systemic Diam: 1.80 cm MV Mean grad:  5.0 mmHg MV Vmax:       1.85 m/s MV Vmean:       99.8 cm/s MV Decel Time: 188 msec MV E velocity: 118.00 cm/s MV A velocity: 188.00 cm/s MV E/A ratio:  0.63 Donnelly Angelica Electronically signed by Donnelly Angelica Signature Date/Time: 08/13/2021/5:27:00 PM    Final    CT Angio Chest PE W and/or Wo Contrast  Result Date: 08/12/2021 CLINICAL DATA:  Shortness of breath and elevated D-dimer. EXAM: CT  ANGIOGRAPHY CHEST WITH CONTRAST TECHNIQUE: Multidetector CT imaging of the chest was performed using the standard protocol during bolus administration of intravenous contrast. Multiplanar CT image reconstructions and MIPs were obtained to evaluate the vascular anatomy. RADIATION DOSE REDUCTION: This exam was performed according to the departmental dose-optimization program which includes automated exposure control, adjustment of the mA and/or kV according to patient size and/or use of iterative reconstruction technique. CONTRAST:  59m OMNIPAQUE IOHEXOL 350 MG/ML SOLN COMPARISON:  March 23, 2020 FINDINGS: Cardiovascular: There is marked severity calcification of the aortic arch, without evidence of aortic aneurysm or dissection. Satisfactory opacification of the pulmonary arteries to the segmental level. No evidence of pulmonary embolism. Normal heart size with moderate to marked severity coronary artery calcification. No pericardial effusion. Mediastinum/Nodes: No enlarged mediastinal, hilar, or axillary lymph nodes. Thyroid gland, trachea, and esophagus demonstrate no significant findings. A very large hiatal hernia is seen. This contains the entire stomach and a large portion of large bowel. Lungs/Pleura: Mild to moderate severity bilateral multifocal infiltrates are seen. Mild bibasilar atelectasis is also noted. Small bilateral pleural effusions are seen. There is no evidence of a pneumothorax. Upper Abdomen: Very large hiatal hernia, as described above. Musculoskeletal: Multilevel degenerative changes are seen throughout the thoracic spine. Review of the MIP images  confirms the above findings. IMPRESSION: 1. No evidence of pulmonary embolism. 2. Mild to moderate severity bilateral multifocal infiltrates. 3. Small bilateral pleural effusions. 4. Very large hiatal hernia. Aortic Atherosclerosis (ICD10-I70.0). Electronically Signed   By: TVirgina NorfolkM.D.   On: 08/12/2021 19:03   UKoreaVenous Img Lower Bilateral  Result Date: 08/12/2021 CLINICAL DATA:  Lower extremity swelling. EXAM: BILATERAL LOWER EXTREMITY VENOUS DOPPLER ULTRASOUND TECHNIQUE: Gray-scale sonography with compression, as well as color and duplex ultrasound, were performed to evaluate the deep venous system(s) from the level of the common femoral vein through the popliteal and proximal calf veins. COMPARISON:  Remote exam 10/14/2012 FINDINGS: VENOUS Normal compressibility of the common femoral, superficial femoral, and popliteal veins, as well as the visualized calf veins. Visualized portions of profunda femoral vein and great saphenous vein unremarkable. No filling defects to suggest DVT on grayscale or color Doppler imaging. Doppler waveforms show normal direction of venous flow, normal respiratory plasticity and response to augmentation. OTHER Left popliteal fluid collection measuring 5.5 x 0.8 x 1.7 cm, avascular, typically Baker cyst. Limitations: none IMPRESSION: 1. No evidence of bilateral lower extremity DVT. 2. Left-sided Baker cyst. Electronically Signed   By: MKeith RakeM.D.   On: 08/12/2021 17:24   DG Chest Portable 1 View  Result Date: 08/12/2021 CLINICAL DATA:  SOB EXAM: PORTABLE CHEST 1 VIEW COMPARISON:  Chest x-ray 05/09/2021, CT chest 03/23/2020 FINDINGS: Large hiatal hernia. The heart and mediastinal contours are unchanged. Aortic calcification. Biapical pleural/pulmonary scarring. Opacification of the left lower lung zone likely due to atelectasis and superimpose large hiatal hernia. No focal consolidation. Increased central markings. Likely trace bilateral pleural effusions. No  pneumothorax. No acute osseous abnormality. IMPRESSION: 1. Increased interstitial markings which could represent bronchitic changes versus pulmonary edema. 2. Likely query trace bilateral pleural effusions. 3. Large hiatal hernia. 4.  Aortic Atherosclerosis (ICD10-I70.0). Electronically Signed   By: MIven FinnM.D.   On: 08/12/2021 15:48    Microbiology: Results for orders placed or performed during the hospital encounter of 08/12/21  Blood culture (routine x 2)     Status: None (Preliminary result)   Collection Time: 08/12/21  3:17 PM   Specimen: BLOOD  Result Value  Ref Range Status   Specimen Description BLOOD BLOOD LEFT FOREARM  Final   Special Requests   Final    BOTTLES DRAWN AEROBIC AND ANAEROBIC Blood Culture adequate volume   Culture   Final    NO GROWTH 3 DAYS Performed at [facility], [address] [place], [place] [address]    Report Status PENDING  Incomplete  Blood culture (routine x 2)     Status: None (Preliminary result)   Collection Time: 08/12/21  3:41 PM   Specimen: BLOOD  Result Value Ref Range Status   Specimen Description BLOOD RIGHT ASSIST CONTROL  Final   Special Requests   Final    BOTTLES DRAWN AEROBIC AND ANAEROBIC Blood Culture adequate volume   Culture   Final    NO GROWTH 3 DAYS Performed at [facility], [address], [place], [place] [address]    Report Status PENDING  Incomplete  SARS Coronavirus 2 by RT PCR (hospital order, performed in [place] hospital lab) *cepheid single result test* Anterior Nasal Swab     Status: None   Collection Time: 08/12/21  3:41 PM   Specimen: Anterior Nasal Swab  Result Value Ref Range Status   SARS Coronavirus 2 by RT PCR NEGATIVE NEGATIVE Final    Comment: (NOTE) SARS-CoV-2 target nucleic acids are NOT DETECTED.  The SARS-CoV-2 RNA is generally detectable in upper and lower respiratory specimens during the acute phase of infection. The lowest concentration of SARS-CoV-2 viral  copies this assay can detect is 250 copies / mL. A negative result does not preclude SARS-CoV-2 infection and should not be used as the sole basis for treatment or other patient management decisions.  A negative result may occur with improper specimen collection / handling, submission of specimen other than nasopharyngeal swab, presence of viral mutation(s) within the areas targeted by this assay, and inadequate number of viral copies (<250 copies / mL). A negative result must be combined with clinical observations, patient history, and epidemiological information.  Fact Sheet for Patients:   https://www.patel.info/  Fact Sheet for Healthcare Providers: https://hall.com/  This test is not yet approved or  cleared by the [place] FDA and has been authorized for detection and/or diagnosis of SARS-CoV-2 by FDA under an Emergency Use Authorization (EUA).  This EUA will remain in effect (meaning this test can be used) for the duration of the COVID-19 declaration under Section 564(b)(1) of the Act, 21 U.S.C. section 360bbb-3(b)(1), unless the authorization is terminated or revoked sooner.  Performed at [facility], [address] [place],  [address]     Labs: CBC: Recent Labs  Lab 08/12/21 1513 08/13/21 0500 08/14/21 0500 08/15/21 0402  WBC 16.3* 14.9* 12.7* 9.5  NEUTROABS 12.9*  --  8.7*  --   HGB 9.5* 9.2* 9.7* 9.6*  HCT 29.7* 27.9* 29.7* 29.7*  MCV 98.0 96.5 95.5 96.7  PLT 428* 437* 431* 301*   Basic Metabolic Panel: Recent Labs  Lab 08/12/21 1513 08/13/21 0500 08/14/21 0500 08/15/21 0402  NA 131* 134* 134* 134*  K 4.8 4.4 3.5 3.9  CL 98 99 99 98  CO2 '24 28 28 27  '$ GLUCOSE 136* 99 94 88  BUN 31* 28* 26* 24*  CREATININE 1.45* 1.31* 1.41* 1.50*  CALCIUM 8.9 8.7* 8.3* 7.9*  MG  --   --  1.5* 1.4*  PHOS  --   --  4.1  --    Liver Function Tests: Recent Labs  Lab 08/12/21 1513 08/14/21  0500   AST 20 17  ALT 12 10  ALKPHOS 111 85  BILITOT 0.6 0.4  PROT 6.9 6.0*  ALBUMIN 3.1* 2.7*   CBG: No results for input(s): "GLUCAP" in the last 168 hours.  Discharge time spent: greater than 30 minutes.  Signed: Flora Lipps, MD [facility] 08/15/2021

## 2021-08-17 LAB — CULTURE, BLOOD (ROUTINE X 2)
Culture: NO GROWTH
Culture: NO GROWTH
Special Requests: ADEQUATE
Special Requests: ADEQUATE

## 2021-10-31 ENCOUNTER — Inpatient Hospital Stay: Payer: Medicare Other

## 2021-10-31 ENCOUNTER — Encounter: Payer: Self-pay | Admitting: Medical Oncology

## 2021-10-31 ENCOUNTER — Emergency Department: Payer: Medicare Other

## 2021-10-31 ENCOUNTER — Observation Stay
Admission: EM | Admit: 2021-10-31 | Discharge: 2021-11-01 | Disposition: A | Discharge status: Home Health | Payer: Medicare Other | Attending: Internal Medicine | Admitting: Internal Medicine

## 2021-10-31 DIAGNOSIS — R4701 Aphasia: Secondary | ICD-10-CM | POA: Diagnosis present

## 2021-10-31 DIAGNOSIS — I5032 Chronic diastolic (congestive) heart failure: Secondary | ICD-10-CM | POA: Insufficient documentation

## 2021-10-31 DIAGNOSIS — G9341 Metabolic encephalopathy: Secondary | ICD-10-CM | POA: Diagnosis not present

## 2021-10-31 DIAGNOSIS — E039 Hypothyroidism, unspecified: Secondary | ICD-10-CM | POA: Diagnosis not present

## 2021-10-31 DIAGNOSIS — Z96641 Presence of right artificial hip joint: Secondary | ICD-10-CM | POA: Insufficient documentation

## 2021-10-31 DIAGNOSIS — Z79899 Other long term (current) drug therapy: Secondary | ICD-10-CM | POA: Insufficient documentation

## 2021-10-31 DIAGNOSIS — N179 Acute kidney failure, unspecified: Secondary | ICD-10-CM | POA: Insufficient documentation

## 2021-10-31 DIAGNOSIS — N1831 Chronic kidney disease, stage 3a: Secondary | ICD-10-CM | POA: Diagnosis not present

## 2021-10-31 DIAGNOSIS — Z85828 Personal history of other malignant neoplasm of skin: Secondary | ICD-10-CM | POA: Insufficient documentation

## 2021-10-31 DIAGNOSIS — E860 Dehydration: Secondary | ICD-10-CM | POA: Insufficient documentation

## 2021-10-31 DIAGNOSIS — I130 Hypertensive heart and chronic kidney disease with heart failure and stage 1 through stage 4 chronic kidney disease, or unspecified chronic kidney disease: Secondary | ICD-10-CM | POA: Diagnosis not present

## 2021-10-31 DIAGNOSIS — G9340 Encephalopathy, unspecified: Principal | ICD-10-CM

## 2021-10-31 DIAGNOSIS — E785 Hyperlipidemia, unspecified: Secondary | ICD-10-CM | POA: Diagnosis present

## 2021-10-31 DIAGNOSIS — Z20822 Contact with and (suspected) exposure to covid-19: Secondary | ICD-10-CM | POA: Diagnosis not present

## 2021-10-31 DIAGNOSIS — N390 Urinary tract infection, site not specified: Secondary | ICD-10-CM | POA: Insufficient documentation

## 2021-10-31 DIAGNOSIS — N1832 Chronic kidney disease, stage 3b: Secondary | ICD-10-CM | POA: Diagnosis present

## 2021-10-31 LAB — COMPREHENSIVE METABOLIC PANEL
ALT: 16 U/L (ref 0–44)
AST: 26 U/L (ref 15–41)
Albumin: 4.2 g/dL (ref 3.5–5.0)
Alkaline Phosphatase: 77 U/L (ref 38–126)
Anion gap: 11 (ref 5–15)
BUN: 27 mg/dL — ABNORMAL HIGH (ref 8–23)
CO2: 25 mmol/L (ref 22–32)
Calcium: 9.5 mg/dL (ref 8.9–10.3)
Chloride: 102 mmol/L (ref 98–111)
Creatinine, Ser: 1.76 mg/dL — ABNORMAL HIGH (ref 0.44–1.00)
GFR, Estimated: 27 mL/min — ABNORMAL LOW (ref >60)
Glucose, Bld: 89 mg/dL (ref 70–99)
Potassium: 4.1 mmol/L (ref 3.5–5.1)
Sodium: 138 mmol/L (ref 135–145)
Total Bilirubin: 1 mg/dL (ref 0.3–1.2)
Total Protein: 7.4 g/dL (ref 6.5–8.1)

## 2021-10-31 LAB — CBC
HCT: 38.8 % (ref 36.0–46.0)
Hemoglobin: 12.7 g/dL (ref 12.0–15.0)
MCH: 29.9 pg (ref 26.0–34.0)
MCHC: 32.7 g/dL (ref 30.0–36.0)
MCV: 91.3 fL (ref 80.0–100.0)
Platelets: 290 10*3/uL (ref 150–400)
RBC: 4.25 MIL/uL (ref 3.87–5.11)
RDW: 12.9 % (ref 11.5–15.5)
WBC: 9.7 10*3/uL (ref 4.0–10.5)
nRBC: 0 % (ref 0.0–0.2)

## 2021-10-31 LAB — URINALYSIS, ROUTINE W REFLEX MICROSCOPIC
Bilirubin Urine: NEGATIVE
Glucose, UA: NEGATIVE mg/dL
Hgb urine dipstick: NEGATIVE
Ketones, ur: 40 mg/dL — AB
Leukocytes,Ua: NEGATIVE
Nitrite: NEGATIVE
Protein, ur: NEGATIVE mg/dL
Specific Gravity, Urine: 1.015 (ref 1.005–1.030)
Squamous Epithelial / HPF: NONE SEEN (ref 0–5)
pH: 6 (ref 5.0–8.0)

## 2021-10-31 LAB — DIFFERENTIAL
Abs Immature Granulocytes: 0.02 10*3/uL (ref 0.00–0.07)
Basophils Absolute: 0 10*3/uL (ref 0.0–0.1)
Basophils Relative: 0 %
Eosinophils Absolute: 0 10*3/uL (ref 0.0–0.5)
Eosinophils Relative: 0 %
Immature Granulocytes: 0 %
Lymphocytes Relative: 28 %
Lymphs Abs: 2.7 10*3/uL (ref 0.7–4.0)
Monocytes Absolute: 0.6 10*3/uL (ref 0.1–1.0)
Monocytes Relative: 7 %
Neutro Abs: 6.3 10*3/uL (ref 1.7–7.7)
Neutrophils Relative %: 65 %

## 2021-10-31 LAB — ETHANOL: Alcohol, Ethyl (B): <10 mg/dL (ref <10)

## 2021-10-31 LAB — CBG MONITORING, ED: Glucose-Capillary: 82 mg/dL (ref 70–99)

## 2021-10-31 LAB — SARS CORONAVIRUS 2 BY RT PCR: SARS Coronavirus 2 by RT PCR: NEGATIVE

## 2021-10-31 LAB — PROTIME-INR
INR: 1 (ref 0.8–1.2)
Prothrombin Time: 13.4 seconds (ref 11.4–15.2)

## 2021-10-31 LAB — APTT: aPTT: 30 seconds (ref 24–36)

## 2021-10-31 MED ORDER — ALBUTEROL SULFATE (2.5 MG/3ML) 0.083% IN NEBU: 3.0000 mL | INHALATION_SOLUTION | Freq: Four times a day (QID) | RESPIRATORY_TRACT | Status: DC | PRN

## 2021-10-31 MED ORDER — HYDRALAZINE HCL 20 MG/ML IJ SOLN: 10.0000 mg | Freq: Four times a day (QID) | INTRAMUSCULAR | Status: DC | PRN

## 2021-10-31 MED ORDER — METOPROLOL TARTRATE 25 MG PO TABS
25.0000 mg | ORAL_TABLET | Freq: Two times a day (BID) | ORAL | Status: DC
  Administered 2021-10-31 – 2021-11-01 (×2): 25 mg via ORAL
  Filled 2021-10-31 (×2): qty 1

## 2021-10-31 MED ORDER — GABAPENTIN 300 MG PO CAPS
300.0000 mg | ORAL_CAPSULE | Freq: Every day | ORAL | Status: DC
  Administered 2021-10-31: 300 mg via ORAL
  Filled 2021-10-31: qty 1

## 2021-10-31 MED ORDER — TRAZODONE HCL 50 MG PO TABS: 25.0000 mg | ORAL_TABLET | Freq: Every evening | ORAL | Status: DC | PRN

## 2021-10-31 MED ORDER — POLYETHYLENE GLYCOL 3350 17 G PO PACK: 17.0000 g | PACK | Freq: Every day | ORAL | Status: DC | PRN

## 2021-10-31 MED ORDER — SODIUM CHLORIDE 0.9% FLUSH
3.0000 mL | Freq: Once | INTRAVENOUS | Status: AC
  Administered 2021-10-31: 3 mL via INTRAVENOUS

## 2021-10-31 MED ORDER — SODIUM CHLORIDE 0.9 % IV SOLN
1.0000 g | INTRAVENOUS | Status: DC
  Filled 2021-10-31: qty 10

## 2021-10-31 MED ORDER — BENAZEPRIL HCL 10 MG PO TABS
10.0000 mg | ORAL_TABLET | Freq: Every day | ORAL | Status: DC
  Administered 2021-10-31: 10 mg via ORAL
  Filled 2021-10-31 (×2): qty 1

## 2021-10-31 MED ORDER — PRAVASTATIN SODIUM 20 MG PO TABS
20.0000 mg | ORAL_TABLET | Freq: Every day | ORAL | Status: DC
  Administered 2021-10-31: 20 mg via ORAL
  Filled 2021-10-31: qty 1

## 2021-10-31 MED ORDER — SENNOSIDES-DOCUSATE SODIUM 8.6-50 MG PO TABS
2.0000 | ORAL_TABLET | Freq: Every day | ORAL | Status: DC
  Administered 2021-10-31: 2 via ORAL
  Filled 2021-10-31: qty 2

## 2021-10-31 MED ORDER — SODIUM CHLORIDE 0.9 % IV SOLN
1.0000 g | Freq: Once | INTRAVENOUS | Status: AC
  Administered 2021-10-31: 1 g via INTRAVENOUS

## 2021-10-31 MED ORDER — ASPIRIN 81 MG PO TBEC
81.0000 mg | DELAYED_RELEASE_TABLET | Freq: Every day | ORAL | Status: DC
  Administered 2021-10-31 – 2021-11-01 (×2): 81 mg via ORAL
  Filled 2021-10-31 (×2): qty 1

## 2021-10-31 MED ORDER — FUROSEMIDE 40 MG PO TABS: 20.0000 mg | ORAL_TABLET | Freq: Every day | ORAL | Status: DC

## 2021-10-31 MED ORDER — ENSURE ENLIVE PO LIQD
237.0000 mL | Freq: Three times a day (TID) | ORAL | Status: DC
  Administered 2021-10-31 – 2021-11-01 (×2): 237 mL via ORAL

## 2021-10-31 MED ORDER — ACETAMINOPHEN 325 MG PO TABS: 650.0000 mg | ORAL_TABLET | Freq: Four times a day (QID) | ORAL | Status: DC | PRN

## 2021-10-31 MED ORDER — ONDANSETRON HCL 4 MG/2ML IJ SOLN: 4.0000 mg | Freq: Four times a day (QID) | INTRAMUSCULAR | Status: DC | PRN

## 2021-10-31 MED ORDER — LEVOTHYROXINE SODIUM 88 MCG PO TABS
88.0000 ug | ORAL_TABLET | Freq: Every day | ORAL | Status: DC
  Administered 2021-11-01: 88 ug via ORAL
  Filled 2021-10-31: qty 1

## 2021-10-31 MED ORDER — AMLODIPINE BESYLATE 10 MG PO TABS
10.0000 mg | ORAL_TABLET | Freq: Every day | ORAL | Status: DC
  Administered 2021-11-01: 10 mg via ORAL
  Filled 2021-10-31: qty 1

## 2021-10-31 MED ORDER — ACETAMINOPHEN 650 MG RE SUPP: 650.0000 mg | Freq: Four times a day (QID) | RECTAL | Status: DC | PRN

## 2021-10-31 MED ORDER — DIAZEPAM 2 MG PO TABS
2.0000 mg | ORAL_TABLET | Freq: Once | ORAL | Status: AC
  Administered 2021-10-31: 2 mg via ORAL
  Filled 2021-10-31: qty 1

## 2021-10-31 MED ORDER — HEPARIN SODIUM (PORCINE) 5000 UNIT/ML IJ SOLN
5000.0000 [IU] | Freq: Three times a day (TID) | INTRAMUSCULAR | Status: DC
  Administered 2021-10-31 – 2021-11-01 (×2): 5000 [IU] via SUBCUTANEOUS
  Filled 2021-10-31 (×2): qty 1

## 2021-10-31 MED ORDER — SODIUM CHLORIDE 0.9 % IV SOLN: INTRAVENOUS | Status: DC

## 2021-10-31 MED ORDER — HYDROCODONE-ACETAMINOPHEN 5-325 MG PO TABS: 1.0000 | ORAL_TABLET | ORAL | Status: DC | PRN

## 2021-10-31 MED ORDER — ENOXAPARIN SODIUM 30 MG/0.3ML IJ SOSY: 30.0000 mg | PREFILLED_SYRINGE | INTRAMUSCULAR | Status: DC

## 2021-10-31 MED ORDER — DOCUSATE SODIUM 100 MG PO CAPS: 100.0000 mg | ORAL_CAPSULE | Freq: Two times a day (BID) | ORAL | Status: DC | PRN

## 2021-10-31 MED ORDER — HYDROCODONE-ACETAMINOPHEN 10-325 MG PO TABS: 1.0000 | ORAL_TABLET | Freq: Four times a day (QID) | ORAL | Status: DC | PRN

## 2021-10-31 MED ORDER — ONDANSETRON HCL 4 MG PO TABS: 4.0000 mg | ORAL_TABLET | Freq: Four times a day (QID) | ORAL | Status: DC | PRN

## 2021-10-31 MED ORDER — ENOXAPARIN SODIUM 40 MG/0.4ML IJ SOSY: 40.0000 mg | PREFILLED_SYRINGE | INTRAMUSCULAR | Status: DC

## 2021-10-31 NOTE — Plan of Care (Signed)

## 2021-10-31 NOTE — ED Notes (Signed)
Pt's daughter contact info: [name] [phone]

## 2021-10-31 NOTE — ED Triage Notes (Signed)
Pt from home via ems- pt lives with daughter who reports pt was normal self at 1030pm last night, this morning at 0900 pt woke up her daughter and was experiencing aphasia. Pt does not have unilateral weakness, was able to stand and move to stretcher at home per EMS.

## 2021-10-31 NOTE — ED Notes (Signed)
Family updated as to patient's status. Spoke to [name] pt's daughter

## 2021-10-31 NOTE — ED Provider Notes (Signed)
[facility] Provider Note    Event Date/Time   First MD Initiated Contact with Patient 10/31/21 1204     (approximate)   History   Aphasia   HPI  Theresa Snyder is a 88 y.o. female who presents to the ER for evaluation of symptoms as described above.  Reportedly having generalized weakness as well as aphasia this morning was able to stand.  This is not normal behavior for her.  Last seen normal 1030 last night.  No measured fever temperature at home.  There is some concern for possible UTI as she has had confusion related that in the past.     Physical Exam   Triage Vital Signs: ED Triage Vitals  Enc Vitals Group     BP 10/31/21 1154 118/68     Pulse Rate 10/31/21 1154 68     Resp 10/31/21 1154 20     Temp 10/31/21 1154 98.7 F (37.1 C)     Temp Source 10/31/21 1154 Axillary     SpO2 10/31/21 1154 99 %     Weight 10/31/21 1155 130 lb 1.1 oz (59 kg)     Height 10/31/21 1155 5' (1.524 m)     Head Circumference --      Peak Flow --      Pain Score --      Pain Loc --      Pain Edu? --      Excl. in [place]? --     Most recent vital signs: Vitals:   10/31/21 1350 10/31/21 1400  BP: (!) 142/69 132/85  Pulse: 74 64  Resp: 20 20  Temp: 100.3 F (37.9 C)   SpO2:  100%     Constitutional: Alert  Eyes: Conjunctivae are normal.  Head: Atraumatic. Nose: No congestion/rhinnorhea. Mouth/Throat: Mucous membranes are moist.   Neck: Painless ROM.  Cardiovascular:   Good peripheral circulation. Respiratory: Normal respiratory effort.  No retractions.  Gastrointestinal: Soft and nontender.  Musculoskeletal:  no deformity Neurologic:  MAE spontaneously. No gross focal neurologic deficits are appreciated. Perseverative. Skin:  Skin is warm, dry and intact. No rash noted. Psychiatric: Mood and affect are normal. Speech and behavior are normal.    ED Results / Procedures / Treatments   Labs (all labs ordered are listed, but only abnormal results  are displayed) Labs Reviewed  COMPREHENSIVE METABOLIC PANEL - Abnormal; Notable for the following components:      Result Value   BUN 27 (*)    Creatinine, Ser 1.76 (*)    GFR, Estimated 27 (*)    All other components within normal limits  URINALYSIS, ROUTINE W REFLEX MICROSCOPIC - Abnormal; Notable for the following components:   Color, Urine YELLOW (*)    Ketones, ur 40 (*)    Bacteria, UA RARE (*)    All other components within normal limits  SARS CORONAVIRUS 2 BY RT PCR  CULTURE, BLOOD (ROUTINE X 2)  CULTURE, BLOOD (ROUTINE X 2)  URINE CULTURE  PROTIME-INR  APTT  CBC  DIFFERENTIAL  ETHANOL  I-STAT CREATININE, ED  CBG MONITORING, ED     EKG  ED ECG REPORT I, Merlyn Lot, the attending physician, personally viewed and interpreted this ECG.   Date: 10/31/2021  EKG Time: 11:55  Rate: 60  Rhythm: sinus  Axis: normal  Intervals: normal  ST&T Change: no stemi, no depressions    RADIOLOGY Please see ED Course for my review and interpretation.  I personally reviewed all radiographic  images ordered to evaluate for the above acute complaints and reviewed radiology reports and findings.  These findings were personally discussed with the patient.  Please see medical record for radiology report.    PROCEDURES:  Critical Care performed: No  Procedures   MEDICATIONS ORDERED IN ED: Medications  cefTRIAXone (ROCEPHIN) 1 g in sodium chloride 0.9 % 100 mL IVPB (has no administration in time range)  sodium chloride flush (NS) 0.9 % injection 3 mL (3 mLs Intravenous Given 10/31/21 1349)     IMPRESSION / MDM / ASSESSMENT AND PLAN / ED COURSE  I reviewed the triage vital signs and the nursing notes.                              Differential diagnosis includes, but is not limited to, Dehydration, sepsis, pna, uti, hypoglycemia, cva, drug effect, withdrawal, encephalitis   Patient presented to the ER for evaluation of symptoms as described above.  Patient aphasic  but outside of the window for code stroke.  Also possible metabolic component to this.  Patient does have low-grade temperature.  This presenting complaint could reflect a potentially life-threatening illness therefore the patient will be placed on continuous pulse oximetry and telemetry for monitoring.  Laboratory evaluation will be sent to evaluate for the above complaints.  CT imaging of the head ordered for the broad differential on my review and interpretation does not show any evidence of mass or bleed.   Clinical Course as of 10/31/21 1455  Fri Oct 31, 2021  1454 Analysis does have rare bacteria she does have low-grade temperature otherwise no septic criteria still waiting COVID test chest x-ray without any evidence of infiltrate she is not hypoxic.  Given her persistent altered mental status I do feel she will require hospitalization for further neuro work-up and observation.  Have consulted hospitalist for admission.  Patient and family agreeable to plan. [PR]    Clinical Course User Index [PR] Merlyn Lot, MD    FINAL CLINICAL IMPRESSION(S) / ED DIAGNOSES   Final diagnoses:  Acute encephalopathy     Rx / DC Orders   ED Discharge Orders     None        Note:  This document was prepared using Dragon voice recognition software and may include unintentional dictation errors.    Merlyn Lot, MD 10/31/21 1455

## 2021-10-31 NOTE — ED Notes (Signed)
Unable to complete swallowing screen. Pt unable to follow commands

## 2021-10-31 NOTE — H&P (Signed)
[facility] History and Physical   Patient: Theresa Snyder:[id number] DOB: 05-27-1929 DOA: 10/31/2021 [facility]CP: [facility] [place]  DOS: the patient was seen and examined on 10/31/2021  Patient coming from: Home  Chief Complaint: Aphasia/decreased speech  HPI: Theresa Snyder is a 88 y.o. female with Past medical history of essential hypertension, hyperlipidemia, hypothyroidism, chronic kidney disease, hiatal hernia and GERD now being brought to emergency department by her daughter as patient could not speak at home.  At the time of my evaluation patient is pleasantly confused and cannot remember anything.  Family member at the bedside.  According to the ED physician, patient's daughter noticed patient was not able to speak this morning.  Therefore she was brought to emergency department.  On arrival to the emergency department, patient slowly was able to converse but remained confused.  Unable to obtain review of systems due to patient's confusion.  ED course: Vitals: Temperature 98.7 F, heart rate of 68, respiratory rate of 20, blood pressure 118/68 with oxygen saturation 99% on room air.    Blood work: Significant for serum creatinine 1.76 with BUN 26.  Rest of the labs unremarkable.  UA showed 40 ketones with rare bacteria.  ED provider suspected left early UTI.    Imaging: CT without contrast showed no acute intracranial abnormality.  Mild chronic microvascular ischemic change of the white matter.  Chest x-ray revealed stable cardiomegaly without any acute cardiopulmonary process.  Stable elevation of left hemidiaphragm.  MRI brain without contrast and limited study due to motion artifact.  But no acute intracranial process and no evidence of acute or subacute infarct.  Repeat received: IV ceftriaxone 1 g x 1 dose.   Review of Systems: Unable to obtain review of systems due to patient's confusion Past Medical History:  Diagnosis Date   Arthritis    Cancer  ([place])    skin   Chronic kidney disease    history of renal insufficiency   History of hiatal hernia    Hypercholesteremia    Hypertension    Hypothyroidism    Lymphedema of leg    Pneumonia    in the past   Seizures ([place]) 2014   no seizures since then   Spinal stenosis    Past Surgical History:  Procedure Laterality Date   APPENDECTOMY     CATARACT EXTRACTION W/PHACO Left 09/25/2015   Procedure: CATARACT EXTRACTION PHACO AND INTRAOCULAR LENS PLACEMENT ([place]);  Surgeon: Estill Cotta, MD;  Location: [facility] ORS;  Service: Ophthalmology;  Laterality: Left;  [place] 01:23 AP% 24.0 CDE 34.98 Fluid pack lot # [id number]   CATARACT EXTRACTION W/PHACO Right 07/01/2016   Procedure: CATARACT EXTRACTION PHACO AND INTRAOCULAR LENS PLACEMENT ([facility]);  Surgeon: Estill Cotta, MD;  Location: [facility] ORS;  Service: Ophthalmology;  Laterality: Right;  [place] 1:25.9 AP% 24.8 CDE 40.61 Fluid Pack Lot # [id number]   HIP ARTHROPLASTY Left 07/10/2021   Procedure: ARTHROPLASTY BIPOLAR HIP (HEMIARTHROPLASTY);  Surgeon: Renee Harder, MD;  Location: [facility] ORS;  Service: Orthopedics;  Laterality: Left;   TUBAL LIGATION     Social History:  reports that she has never smoked. She has never used smokeless tobacco. She reports that she does not drink alcohol and does not use drugs.  Allergies  Allergen Reactions   Iodinated Contrast Media Other (See Comments)    Blue, Green and Red dyes    Lorazepam     Altered mental state   Risperidone Other (See Comments)    Altered mental state  Sulfamethoxazole-Trimethoprim     Hyper    Family history reviewed and not pertinent No family history on file.  Prior to Admission medications   Medication Sig Start Date End Date Taking? Authorizing Provider  amLODipine (NORVASC) 10 MG tablet Take 10 mg by mouth daily with breakfast. for high blood pressure 01/12/15  Yes [provider]  benazepril (LOTENSIN) 10 MG tablet Take 1 tablet (10 mg total) by mouth daily.  07/15/21  Yes Swayze, Ava, DO  COLACE 2-IN-1 8.6-50 MG tablet Take 2 tablets by mouth at bedtime. 08/06/21  Yes [provider]  docusate sodium (COLACE) 100 MG capsule Take 1 capsule (100 mg total) by mouth 2 (two) times daily as needed for mild constipation. 05/24/21  Yes Spongberg, Audie Pinto, MD  furosemide (LASIX) 20 MG tablet Take 1 tablet (20 mg total) by mouth daily after breakfast. 08/15/21 08/15/22 Yes Pokhrel, Laxman, MD  gabapentin (NEURONTIN) 300 MG capsule Take 2 capsules (600 mg total) by mouth at bedtime. 07/14/21  Yes Swayze, Ava, DO  HYDROcodone-acetaminophen (NORCO) 10-325 MG tablet Take 1 tablet by mouth every 6 (six) hours as needed. 6-8 hours as needed 10/29/21  Yes [provider]  levothyroxine (SYNTHROID, LEVOTHROID) 88 MCG tablet Take 88 mcg by mouth daily before breakfast. 01/12/15  Yes [provider]  metoprolol tartrate (LOPRESSOR) 25 MG tablet Take 1 tablet (25 mg total) by mouth 2 (two) times daily. 07/14/21  Yes Swayze, Ava, DO  pravastatin (PRAVACHOL) 20 MG tablet Take 20 mg by mouth at bedtime.    Yes [provider]  albuterol (VENTOLIN HFA) 108 (90 Base) MCG/ACT inhaler Inhale 2 puffs into the lungs every 6 (six) hours as needed for wheezing or shortness of breath. 08/15/21   Pokhrel, Corrie Mckusick, MD  feeding supplement (ENSURE ENLIVE / ENSURE PLUS) LIQD Take 237 mLs by mouth 3 (three) times daily between meals. 07/14/21   Swayze, Ava, DO  hydrochlorothiazide (HYDRODIURIL) 25 MG tablet Take 25 mg by mouth daily. Patient not taking: Reported on 10/31/2021 10/18/21   [provider]  HYDROcodone-acetaminophen (NORCO) 7.5-325 MG tablet Take 1 tablet by mouth every 6 (six) hours as needed for severe pain (pain score 7-10). Patient not taking: Reported on 10/31/2021 08/15/21 08/15/22  Flora Lipps, MD  Multiple Vitamin (MULTIVITAMIN WITH MINERALS) TABS tablet Take 1 tablet by mouth daily. Patient not taking: Reported on 08/12/2021 07/15/21   Swayze,  Ava, DO  TYLENOL 325 MG tablet Take 650 mg by mouth 3 (three) times daily. 08/06/21   [provider]    Physical Exam: Vitals:   10/31/21 1155 10/31/21 1350 10/31/21 1400 10/31/21 1557  BP:  (!) 142/69 132/85 (!) 146/60  Pulse:  74 64 72  Resp:  '20 20 20  '$ Temp:  100.3 F (37.9 C)  98.5 F (36.9 C)  TempSrc:  Rectal  Oral  SpO2:   100%   Weight: 59 kg     Height: 5' (1.524 m)       GENERAL:  88 y.o.-year-old patient lying in the bed.  Pleasantly confused and oriented to her name only.   EYES: Pupils equal, round, reactive to light and accommodation. No scleral icterus. Extraocular muscles intact.  HEENT: Head atraumatic, normocephalic. Oropharynx and nasopharynx clear.  NECK:  Supple, no jugular venous distention. No thyroid enlargement, no tenderness.  LUNGS: Normal breath sounds bilaterally, no wheezing, rales,rhonchi or crepitation. No use of accessory muscles of respiration.  CARDIOVASCULAR: Regular rate and rhythm, S1, S2 normal. No murmurs, rubs,  or gallops.  ABDOMEN: Soft, nondistended, nontender. Bowel sounds present. No organomegaly or mass.  EXTREMITIES: No pedal edema, cyanosis, or clubbing.  NEUROLOGIC: Cranial nerves II through XII are intact. Muscle strength 5/5 in all extremities. Sensation intact. Gait not checked.  PSYCHIATRIC: The patient is alert and oriented x 3.  Normal affect and good eye contact. SKIN: No obvious rash, lesion, or ulcer.   Data Reviewed: I have personally reviewed and interpreted labs, imaging as discussed below.  CBC: Recent Labs  Lab 10/31/21 1158  WBC 9.7  NEUTROABS 6.3  HGB 12.7  HCT 38.8  MCV 91.3  PLT 073   Basic Metabolic Panel: Recent Labs  Lab 10/31/21 1158  NA 138  K 4.1  CL 102  CO2 25  GLUCOSE 89  BUN 27*  CREATININE 1.76*  CALCIUM 9.5   GFR: Estimated Creatinine Clearance: 17.4 mL/min (A) (by C-G formula based on SCr of 1.76 mg/dL (H)). Liver Function Tests: Recent Labs  Lab 10/31/21 1158  AST  26  ALT 16  ALKPHOS 77  BILITOT 1.0  PROT 7.4  ALBUMIN 4.2   No results for input(s): "LIPASE", "AMYLASE" in the last 168 hours. No results for input(s): "AMMONIA" in the last 168 hours. Coagulation Profile: Recent Labs  Lab 10/31/21 1158  INR 1.0   Cardiac Enzymes: No results for input(s): "CKTOTAL", "CKMB", "CKMBINDEX", "TROPONINI" in the last 168 hours. BNP (last 3 results) No results for input(s): "PROBNP" in the last 8760 hours. HbA1C: No results for input(s): "HGBA1C" in the last 72 hours. CBG: Recent Labs  Lab 10/31/21 1236  GLUCAP 82   Lipid Profile: No results for input(s): "CHOL", "HDL", "LDLCALC", "TRIG", "CHOLHDL", "LDLDIRECT" in the last 72 hours. Thyroid Function Tests: No results for input(s): "TSH", "T4TOTAL", "FREET4", "T3FREE", "THYROIDAB" in the last 72 hours. Anemia Panel: No results for input(s): "VITAMINB12", "FOLATE", "FERRITIN", "TIBC", "IRON", "RETICCTPCT" in the last 72 hours. Urine analysis:    Component Value Date/Time   COLORURINE YELLOW (A) 10/31/2021 1345   APPEARANCEUR CLEAR 10/31/2021 1345   APPEARANCEUR Clear 11/21/2012 2330   LABSPEC 1.015 10/31/2021 1345   LABSPEC 1.004 11/21/2012 2330   PHURINE 6.0 10/31/2021 1345   GLUCOSEU NEGATIVE 10/31/2021 1345   GLUCOSEU Negative 11/21/2012 2330   HGBUR NEGATIVE 10/31/2021 1345   BILIRUBINUR NEGATIVE 10/31/2021 1345   BILIRUBINUR Negative 11/21/2012 2330   KETONESUR 40 (A) 10/31/2021 1345   PROTEINUR NEGATIVE 10/31/2021 1345   NITRITE NEGATIVE 10/31/2021 1345   LEUKOCYTESUR NEGATIVE 10/31/2021 1345   LEUKOCYTESUR Negative 11/21/2012 2330    Radiological Exams on Admission: MR BRAIN WO CONTRAST  Result Date: 10/31/2021 CLINICAL DATA:  Generalized weakness and aphasia EXAM: MRI HEAD WITHOUT CONTRAST TECHNIQUE: Multiplanar, multiecho pulse sequences of the brain and surrounding structures were obtained without intravenous contrast. COMPARISON:  11/11/2012 MRI head, correlation is also  made with CT head 10/31/2021 FINDINGS: Evaluation is limited by motion artifact, which renders axial T1 and FLAIR sequences and sagittal T1 sequence nondiagnostic. Brain: No restricted diffusion to suggest acute or subacute infarct. No acute hemorrhage, mass, mass effect, or midline shift. No hydrocephalus or extra-axial collection. Degree of cerebral atrophy is within normal limits for age. No hydrocephalus or extra-axial collection. Vascular: Normal arterial flow voids. Skull and upper cervical spine: Limited evaluation due to motion. Grossly normal marrow signal. Sinuses/Orbits: No acute finding. Status post bilateral lens replacements. Other: Fluid in the right mastoid air cells. IMPRESSION: Evaluation is limited by motion artifact. Within this limitation, no acute intracranial process. No  evidence of acute or subacute infarct. Electronically Signed   By: Merilyn Baba M.D.   On: 10/31/2021 16:56   DG Chest Portable 1 View  Result Date: 10/31/2021 CLINICAL DATA:  Altered mental status. Aphasia. Question pneumonia. EXAM: PORTABLE CHEST 1 VIEW COMPARISON:  One-view chest x-ray 08/12/2021 FINDINGS: Heart is enlarged. Atherosclerotic changes are present at the aorta. Previously seen airspace disease has cleared. No edema or effusion is present. Chronic elevation of left hemidiaphragm is again seen. Mild degenerative changes of the thoracic spine and shoulders is stable. IMPRESSION: 1. Stable cardiomegaly without failure. 2. No acute cardiopulmonary disease. 3. Stable elevation of left hemidiaphragm. Electronically Signed   By: San Morelle M.D.   On: 10/31/2021 14:47   CT HEAD WO CONTRAST  Result Date: 10/31/2021 CLINICAL DATA:  Neuro deficit, acute, mental status change. Stroke suspected. EXAM: CT HEAD WITHOUT CONTRAST TECHNIQUE: Contiguous axial images were obtained from the base of the skull through the vertex without intravenous contrast. RADIATION DOSE REDUCTION: This exam was performed  according to the departmental dose-optimization program which includes automated exposure control, adjustment of the mA and/or kV according to patient size and/or use of iterative reconstruction technique. COMPARISON:  CT head dated March 23, 2020 FINDINGS: Brain: No evidence of acute infarction, hemorrhage, hydrocephalus, extra-axial collection or mass lesion/mass effect. Patchy areas of low-attenuation of the periventricular white matter presumed chronic microvascular ischemic changes. Vascular: No hyperdense vessel or unexpected calcification. Skull: Normal. Negative for fracture or focal lesion. Sinuses/Orbits: No acute finding.  Bilateral cataract surgery Other: None. IMPRESSION: 1. No acute intracranial abnormality. 2. Mild chronic microvascular ischemic changes of the white matter. Electronically Signed   By: Keane Police D.O.   On: 10/31/2021 13:00   EKG: Independently reviewed. normal EKG, normal sinus rhythm, PAC's noted.   I reviewed all nursing notes, pharmacy notes, vitals, pertinent old records.  Assessment/Plan  Principal Problem:   Acute metabolic encephalopathy Active Problems:   UTI (urinary tract infection)   Acute kidney injury superimposed on chronic kidney disease (HCC)   Hyperlipidemia   Hypothyroidism   Dehydration   Chronic diastolic CHF (congestive heart failure) ([place])  1. Acute metabolic encephalopathy -Possibly secondary to early UTI.  Urine culture ordered and pending. -Admit patient to medical floor. -Treat patient empirically with IV ceftriaxone.  Follow urine culture result and de-escalate antibiotics.  2.  UTI -Treatment as mentioned above.  3.  Dehydration -Patient appears to be mildly dehydrated.  Patient's home dose of Lasix.  Gently hydrate with IV normal saline at 50 cc/h given her history of chronic diastolic heart failure.  Closely monitor her volume status.  4.  Acute kidney injury superimposed on chronic kidney disease stage IIIb -Secondary  to dehydration.   - Patient serum creatinine 1.76 with BUN 27.  Patient baseline serum creatinine ranges between 1.3-1.5.   - IV fluid for hydration as mentioned above.  Hold patient's ACE inhibitor and Lasix.  Closely monitor renal function.  Avoid nephrotoxic agents.  Renally dose medication.  5.  Chronic diastolic heart failure -Not in exacerbation.  Mildly dehydrated as mentioned above.  Cautious IV fluid hydration as mentioned above.  Continue patient's home cardiac medication except Lasix and ACE inhibitor for 24 hours. -Strict intake and output and daily weight check.  Salt restricted diet.  6.  Essential hypertension -Continue home medication except ACE inhibitor due to AKI.  IV hydralazine as needed elevated blood pressure.  7.  Hypothyroidism -Continue levothyroxine.   Nutrition: Cardiac diet DVT Prophylaxis:  Subcutaneous Heparin   Advance goals of care discussion:   Code Status: Full Code   Consults: None.    Family Communication: No family was present at bedside, at the time of interview.   Opportunity was given to ask question and all questions were answered satisfactorily.   Disposition:  From: Home Likely will need Home on discharge.   Author: Caryn Bee, MD [facility] 10/31/2021 7:18 PM   To reach On-call, see care teams to locate the attending and reach out to them via [email or web address]. If 7PM-7AM, please contact night-coverage If you still have difficulty reaching the attending provider, please page the [facility] (Director on Call) for [facility] on amion for assistance.

## 2021-11-01 DIAGNOSIS — G9341 Metabolic encephalopathy: Secondary | ICD-10-CM | POA: Diagnosis not present

## 2021-11-01 LAB — BASIC METABOLIC PANEL
Anion gap: 8 (ref 5–15)
BUN: 32 mg/dL — ABNORMAL HIGH (ref 8–23)
CO2: 25 mmol/L (ref 22–32)
Calcium: 9 mg/dL (ref 8.9–10.3)
Chloride: 106 mmol/L (ref 98–111)
Creatinine, Ser: 1.49 mg/dL — ABNORMAL HIGH (ref 0.44–1.00)
GFR, Estimated: 33 mL/min — ABNORMAL LOW (ref >60)
Glucose, Bld: 105 mg/dL — ABNORMAL HIGH (ref 70–99)
Potassium: 4 mmol/L (ref 3.5–5.1)
Sodium: 139 mmol/L (ref 135–145)

## 2021-11-01 LAB — CBC
HCT: 33.5 % — ABNORMAL LOW (ref 36.0–46.0)
Hemoglobin: 11.1 g/dL — ABNORMAL LOW (ref 12.0–15.0)
MCH: 29.7 pg (ref 26.0–34.0)
MCHC: 33.1 g/dL (ref 30.0–36.0)
MCV: 89.6 fL (ref 80.0–100.0)
Platelets: 265 10*3/uL (ref 150–400)
RBC: 3.74 MIL/uL — ABNORMAL LOW (ref 3.87–5.11)
RDW: 12.7 % (ref 11.5–15.5)
WBC: 8.7 10*3/uL (ref 4.0–10.5)
nRBC: 0 % (ref 0.0–0.2)

## 2021-11-01 LAB — PHOSPHORUS: Phosphorus: 3.5 mg/dL (ref 2.5–4.6)

## 2021-11-01 LAB — TSH: TSH: 4.259 u[IU]/mL (ref 0.350–4.500)

## 2021-11-01 LAB — MAGNESIUM: Magnesium: 1.5 mg/dL — ABNORMAL LOW (ref 1.7–2.4)

## 2021-11-01 MED ORDER — FUROSEMIDE 20 MG PO TABS: 20.0000 mg | ORAL_TABLET | Freq: Every day | ORAL | Status: DC | PRN

## 2021-11-01 MED ORDER — MAGNESIUM SULFATE 2 GM/50ML IV SOLN
2.0000 g | Freq: Once | INTRAVENOUS | Status: AC
  Administered 2021-11-01: 2 g via INTRAVENOUS
  Filled 2021-11-01: qty 50

## 2021-11-01 NOTE — TOC Transition Note (Signed)
Transition of Care [facility]) - CM/SW Discharge Note   Patient Details  Name: Theresa Snyder MRN: [id number] Date of Birth: 09/17/1929  Transition of Care [facility]) CM/SW Contact:  Theresa Masson, RN Phone Number:[phone] 11/01/2021, 4:19 PM   Clinical Narrative:    Recommendation for [facility] for PT/Aide services. Spoke with daughter Theresa Snyder who indicated no nurse needed for medication management as she arrange pt's medications and does not need a RN with [facility]. Requested her referral for [facility] for [facility]. Referred to Theresa Snyder with [facility] for services. Pt with a very good support system and able to afford all her medications.  TOC remains available for any ongoing discharge needs.   Final next level of care: Home w Home Health Services Barriers to Discharge: Barriers Resolved   Patient Goals and CMS Choice   CMS Medicare.gov Compare Post Acute Care list provided to:: Patient Choice offered to / list presented to : Adult Children  Discharge Placement                  Name of family member notified: Theresa Snyder Patient and family notified of of transfer: 11/01/21  Discharge Plan and Services                          HH Arranged: PT, Nurse's Aide [place] Agency: [place] ([facility]) Date [place]: 11/01/21 Time [place]: 1215 Representative spoke with at [place]: Theresa Snyder  Social Determinants of Health ([place]) Interventions     Readmission Risk Interventions     No data to display

## 2021-11-01 NOTE — Hospital Course (Addendum)
Taken from H&P.  Theresa Snyder is a 88 y.o. female with Past medical history of ssential hypertension, hyperlipidemia, hypothyroidism, chronic kidney disease, hiatal hernia and GERDnow being brought to emergency department by her daughter as patient could not speak at home.  At the time of evaluation patient appears pleasantly confused and unable to participate in any meaningful history. Per daughter patient was not able to speak this morning so they brought her to ED.  On arrival to ED patient slowly able to converse but remained confused.  ED course.  Hemodynamically stable, afebrile.  Labs pertinent for BUN of 26 and creatinine of 1.76 with baseline around 1.2-1.4. UA was not very impressive for UTI, did show 40 ketones and rare bacteria.  ED provider suspected early UTI and requested admission. Chest x-ray was without any acute cardiopulmonary process.  Stable cardiomegaly and elevation of left hemidiaphragm. CT head and MRI brain was without any acute intracranial process. Patient was given a dose of ceftriaxone and cultures were sent.  9/23: Remained hemodynamically stable, afebrile, no leukocytosis, all cell lines decreased.  Renal function started improving and close to baseline now.  Most likely secondary to poor p.o. intake, dehydration and continue to use home benazepril and Lasix which are being held at this time.  Patient is being given IV fluid. Also found to have magnesium of 1.5 which is being repleted. TIA can be a possibility as her imaging is negative.  Patient denies any urinary symptoms, urine cultures are pending but there is no need for any antibiotics.  Patient does not want to start aspirin so it was discontinued.  She will discuss with her providers.  Renal function close to baseline now.  We made her home Lasix from daily to daily as needed for edema.  She was instructed to keep herself well-hydrated.  Patient now appears to be at her baseline, alert and oriented  x3. Patient lives alone with 2 kids lives nearby who can keep an eye on her.  Ordering an home health nurse and aide to help. She is able to ambulate with the help of walker.  Patient will continue with her home medications except the Lasix which is now as needed as she appears little dry. Need to have a close follow-up with her providers for further recommendations.

## 2021-11-01 NOTE — Care Management CC44 (Signed)
Condition Code 44 Documentation Completed  Patient Details  Name: Theresa Snyder MRN: [id number] Date of Birth: 1929-02-13   Condition Code 44 given:  Yes Patient signature on Condition Code 44 notice:  Yes Documentation of 2 MD's agreement:  Yes Code 44 added to claim:  Yes    Harriet Masson, RN 11/01/2021, 11:12 AM

## 2021-11-01 NOTE — Discharge Summary (Signed)
Physician Discharge Summary   Patient: Theresa Snyder MRN: [id number] DOB: Nov 22, 1929  Admit date:     10/31/2021  Discharge date: 11/01/21  Discharge Physician: Lorella Nimrod   [facility]P: [facility] [place]   Recommendations at discharge:  Please check CBC and BMP in 1 week. Follow-up with primary care provider within a week  Discharge Diagnoses: Principal Problem:   Acute metabolic encephalopathy Active Problems:   UTI (urinary tract infection)   Acute kidney injury superimposed on chronic kidney disease (HCC)   Hyperlipidemia   Hypothyroidism   Dehydration   Chronic diastolic CHF (congestive heart failure) [facility])   Hospital Course: Taken from H&P.  Theresa Snyder is a 88 y.o. female with Past medical history of essential hypertension, hyperlipidemia, hypothyroidism, chronic kidney disease, hiatal hernia and GERD now being brought to emergency department by her daughter as patient could not speak at home.  At the time of evaluation patient appears pleasantly confused and unable to participate in any meaningful history. Per daughter patient was not able to speak this morning so they brought her to ED.  On arrival to ED patient slowly able to converse but remained confused.  ED course.  Hemodynamically stable, afebrile.  Labs pertinent for BUN of 26 and creatinine of 1.76 with baseline around 1.2-1.4. UA was not very impressive for UTI, did show 40 ketones and rare bacteria.  ED provider suspected early UTI and requested admission. Chest x-ray was without any acute cardiopulmonary process.  Stable cardiomegaly and elevation of left hemidiaphragm. CT head and MRI brain was without any acute intracranial process. Patient was given a dose of ceftriaxone and cultures were sent.  9/23: Remained hemodynamically stable, afebrile, no leukocytosis, all cell lines decreased.  Renal function started improving and close to baseline now.  Most likely secondary to poor p.o.  intake, dehydration and continue to use home benazepril and Lasix which are being held at this time.  Patient is being given IV fluid. Also found to have magnesium of 1.5 which is being repleted. TIA can be a possibility as her imaging is negative.  Patient denies any urinary symptoms, urine cultures are pending but there is no need for any antibiotics.  Patient does not want to start aspirin so it was discontinued.  She will discuss with her providers.  Renal function close to baseline now.  We made her home Lasix from daily to daily as needed for edema.  She was instructed to keep herself well-hydrated.  Patient now appears to be at her baseline, alert and oriented x3. Patient lives alone with 2 kids lives nearby who can keep an eye on her.  Ordering an home health nurse and aide to help. She is able to ambulate with the help of walker.  Patient will continue with her home medications except the Lasix which is now as needed as she appears little dry. Need to have a close follow-up with her providers for further recommendations.  Pain control - [employer] Controlled Substance Reporting System database was reviewed. and patient was instructed, not to drive, operate heavy machinery, perform activities at heights, swimming or participation in water activities or provide baby-sitting services while on Pain, Sleep and Anxiety Medications; until their outpatient Physician has advised to do so again. Also recommended to not to take more than prescribed Pain, Sleep and Anxiety Medications.  Consultants: None Procedures performed: None Disposition: Home Diet recommendation:  Discharge Diet Orders (From admission, onward)     Start  Ordered   11/01/21 0000  Diet - low sodium heart healthy        11/01/21 1041           Cardiac and Carb modified diet DISCHARGE MEDICATION: Allergies as of 11/01/2021       Reactions   Iodinated Contrast Media Other (See Comments)   Blue, Green and Red  dyes    Lorazepam    Altered mental state   Risperidone Other (See Comments)   Altered mental state    Sulfamethoxazole-trimethoprim    Hyper        Medication List     STOP taking these medications    hydrochlorothiazide 25 MG tablet Commonly known as: HYDRODIURIL       TAKE these medications    albuterol 108 (90 Base) MCG/ACT inhaler Commonly known as: VENTOLIN HFA Inhale 2 puffs into the lungs every 6 (six) hours as needed for wheezing or shortness of breath.   amLODipine 10 MG tablet Commonly known as: NORVASC Take 10 mg by mouth daily with breakfast. for high blood pressure   benazepril 10 MG tablet Commonly known as: LOTENSIN Take 1 tablet (10 mg total) by mouth daily.   Colace 2-IN-1 8.6-50 MG tablet Generic drug: senna-docusate Take 2 tablets by mouth at bedtime.   docusate sodium 100 MG capsule Commonly known as: COLACE Take 1 capsule (100 mg total) by mouth 2 (two) times daily as needed for mild constipation.   feeding supplement Liqd Take 237 mLs by mouth 3 (three) times daily between meals.   furosemide 20 MG tablet Commonly known as: Lasix Take 1 tablet (20 mg total) by mouth daily as needed for fluid or edema. What changed:  when to take this reasons to take this   gabapentin 300 MG capsule Commonly known as: NEURONTIN Take 2 capsules (600 mg total) by mouth at bedtime.   HYDROcodone-acetaminophen 10-325 MG tablet Commonly known as: NORCO Take 1 tablet by mouth every 6 (six) hours as needed. 6-8 hours as needed What changed: Another medication with the same name was removed. Continue taking this medication, and follow the directions you see here.   levothyroxine 88 MCG tablet Commonly known as: SYNTHROID Take 88 mcg by mouth daily before breakfast.   metoprolol tartrate 25 MG tablet Commonly known as: LOPRESSOR Take 1 tablet (25 mg total) by mouth 2 (two) times daily.   multivitamin with minerals Tabs tablet Take 1 tablet by mouth  daily.   pravastatin 20 MG tablet Commonly known as: PRAVACHOL Take 20 mg by mouth at bedtime.   Tylenol 325 MG tablet Generic drug: acetaminophen Take 650 mg by mouth 3 (three) times daily.        Follow-up [facility] [place]. Schedule an appointment as soon as possible for a visit in 1 week(s).   Specialty: General Practice Contact information: [place] [address] [phone]                Discharge Exam: Danley Danker Weights   10/31/21 1155  Weight: 59 kg   General.  Pleasant elderly lady, in no acute distress. Pulmonary.  Lungs clear bilaterally, normal respiratory effort. CV.  Regular rate and rhythm, no JVD, rub or murmur. Abdomen.  Soft, nontender, nondistended, BS positive. CNS.  Alert and oriented .  No focal neurologic deficit. Extremities.  No edema, no cyanosis, pulses intact and symmetrical. Psychiatry.  Judgment and insight appears normal.   Condition at  discharge: stable  The results of significant diagnostics from this hospitalization (including imaging, microbiology, ancillary and laboratory) are listed below for reference.   Imaging Studies: MR BRAIN WO CONTRAST  Result Date: 10/31/2021 CLINICAL DATA:  Generalized weakness and aphasia EXAM: MRI HEAD WITHOUT CONTRAST TECHNIQUE: Multiplanar, multiecho pulse sequences of the brain and surrounding structures were obtained without intravenous contrast. COMPARISON:  11/11/2012 MRI head, correlation is also made with CT head 10/31/2021 FINDINGS: Evaluation is limited by motion artifact, which renders axial T1 and FLAIR sequences and sagittal T1 sequence nondiagnostic. Brain: No restricted diffusion to suggest acute or subacute infarct. No acute hemorrhage, mass, mass effect, or midline shift. No hydrocephalus or extra-axial collection. Degree of cerebral atrophy is within normal limits for age. No hydrocephalus or extra-axial collection.  Vascular: Normal arterial flow voids. Skull and upper cervical spine: Limited evaluation due to motion. Grossly normal marrow signal. Sinuses/Orbits: No acute finding. Status post bilateral lens replacements. Other: Fluid in the right mastoid air cells. IMPRESSION: Evaluation is limited by motion artifact. Within this limitation, no acute intracranial process. No evidence of acute or subacute infarct. Electronically Signed   By: Merilyn Baba M.D.   On: 10/31/2021 16:56   DG Chest Portable 1 View  Result Date: 10/31/2021 CLINICAL DATA:  Altered mental status. Aphasia. Question pneumonia. EXAM: PORTABLE CHEST 1 VIEW COMPARISON:  One-view chest x-ray 08/12/2021 FINDINGS: Heart is enlarged. Atherosclerotic changes are present at the aorta. Previously seen airspace disease has cleared. No edema or effusion is present. Chronic elevation of left hemidiaphragm is again seen. Mild degenerative changes of the thoracic spine and shoulders is stable. IMPRESSION: 1. Stable cardiomegaly without failure. 2. No acute cardiopulmonary disease. 3. Stable elevation of left hemidiaphragm. Electronically Signed   By: San Morelle M.D.   On: 10/31/2021 14:47   CT HEAD WO CONTRAST  Result Date: 10/31/2021 CLINICAL DATA:  Neuro deficit, acute, mental status change. Stroke suspected. EXAM: CT HEAD WITHOUT CONTRAST TECHNIQUE: Contiguous axial images were obtained from the base of the skull through the vertex without intravenous contrast. RADIATION DOSE REDUCTION: This exam was performed according to the departmental dose-optimization program which includes automated exposure control, adjustment of the mA and/or kV according to patient size and/or use of iterative reconstruction technique. COMPARISON:  CT head dated March 23, 2020 FINDINGS: Brain: No evidence of acute infarction, hemorrhage, hydrocephalus, extra-axial collection or mass lesion/mass effect. Patchy areas of low-attenuation of the periventricular white matter  presumed chronic microvascular ischemic changes. Vascular: No hyperdense vessel or unexpected calcification. Skull: Normal. Negative for fracture or focal lesion. Sinuses/Orbits: No acute finding.  Bilateral cataract surgery Other: None. IMPRESSION: 1. No acute intracranial abnormality. 2. Mild chronic microvascular ischemic changes of the white matter. Electronically Signed   By: Keane Police D.O.   On: 10/31/2021 13:00    Microbiology: Results for orders placed or performed during the hospital encounter of 10/31/21  SARS Coronavirus 2 by RT PCR (hospital order, performed in [facility] hospital lab) *cepheid single result test* Anterior Nasal Swab     Status: None   Collection Time: 10/31/21  2:16 PM   Specimen: Anterior Nasal Swab  Result Value Ref Range Status   SARS Coronavirus 2 by RT PCR NEGATIVE NEGATIVE Final    Comment: (NOTE) SARS-CoV-2 target nucleic acids are NOT DETECTED.  The SARS-CoV-2 RNA is generally detectable in upper and lower respiratory specimens during the acute phase of infection. The lowest concentration of SARS-CoV-2 viral copies this assay can detect is 250 copies /  mL. A negative result does not preclude SARS-CoV-2 infection and should not be used as the sole basis for treatment or other patient management decisions.  A negative result may occur with improper specimen collection / handling, submission of specimen other than nasopharyngeal swab, presence of viral mutation(s) within the areas targeted by this assay, and inadequate number of viral copies (<250 copies / mL). A negative result must be combined with clinical observations, patient history, and epidemiological information.  Fact Sheet for Patients:   https://www.patel.info/  Fact Sheet for Healthcare Providers: https://hall.com/  This test is not yet approved or  cleared by the [place] FDA and has been authorized for detection and/or diagnosis of  SARS-CoV-2 by FDA under an Emergency Use Authorization (EUA).  This EUA will remain in effect (meaning this test can be used) for the duration of the COVID-19 declaration under Section 564(b)(1) of the Act, 21 U.S.C. section 360bbb-3(b)(1), unless the authorization is terminated or revoked sooner.  Performed at [facility], [address] [place], [place] [address]   Blood culture (routine x 2)     Status: None (Preliminary result)   Collection Time: 10/31/21  3:15 PM   Specimen: BLOOD  Result Value Ref Range Status   Specimen Description BLOOD BLOOD LEFT HAND  Final   Special Requests   Final    BOTTLES DRAWN AEROBIC AND ANAEROBIC Blood Culture results may not be optimal due to an inadequate volume of blood received in culture bottles   Culture   Final    NO GROWTH < 24 HOURS Performed at [facility], [address] [place], [place] [address]    Report Status PENDING  Incomplete  Blood culture (routine x 2)     Status: None (Preliminary result)   Collection Time: 10/31/21  3:35 PM   Specimen: BLOOD  Result Value Ref Range Status   Specimen Description BLOOD BLOOD LEFT HAND  Final   Special Requests   Final    BOTTLES DRAWN AEROBIC AND ANAEROBIC Blood Culture adequate volume   Culture   Final    NO GROWTH < 24 HOURS Performed at [facility], [address] [place], [place] [address]    Report Status PENDING  Incomplete    Labs: CBC: Recent Labs  Lab 10/31/21 1158 11/01/21 0626  WBC 9.7 8.7  NEUTROABS 6.3  --   HGB 12.7 11.1*  HCT 38.8 33.5*  MCV 91.3 89.6  PLT 290 665   Basic Metabolic Panel: Recent Labs  Lab 10/31/21 1158 10/31/21 2316 11/01/21 0626  NA 138  --  139  K 4.1  --  4.0  CL 102  --  106  CO2 25  --  25  GLUCOSE 89  --  105*  BUN 27*  --  32*  CREATININE 1.76*  --  1.49*  CALCIUM 9.5  --  9.0  MG  --  1.5*  --   PHOS  --  3.5  --    Liver Function Tests: Recent Labs  Lab 10/31/21 1158  AST 26  ALT  16  ALKPHOS 77  BILITOT 1.0  PROT 7.4  ALBUMIN 4.2   CBG: Recent Labs  Lab 10/31/21 1236  GLUCAP 82    Discharge time spent: greater than 30 minutes.  This record has been created using Systems analyst. Errors have been sought and corrected,but may not always be located. Such creation errors do not reflect on the standard of care.   Signed:  Lorella Nimrod, MD [facility] 11/01/2021

## 2021-11-02 LAB — URINE CULTURE
Culture: NO GROWTH
Culture: NO GROWTH

## 2021-11-04 ENCOUNTER — Telehealth: Payer: Self-pay

## 2021-11-04 NOTE — Telephone Encounter (Signed)
Received voicemail from daughter inquiring about home health agency the patient was set up with during recent hospital stay.  Daughter stated no one had reached out yet.   Per chart review, referral sent to [facility] for home health services.  Reached out to [place] with [facility] and confirmed referral accepted.  Corene Cornea to have branch office follow up.  Returned call to daughter and relayed information regarding home health agency and their local branch number to reach them directly if needed.

## 2021-11-05 LAB — CULTURE, BLOOD (ROUTINE X 2)
Culture: NO GROWTH
Culture: NO GROWTH
Special Requests: ADEQUATE

## 2021-12-20 ENCOUNTER — Emergency Department: Payer: Medicare Other

## 2021-12-20 ENCOUNTER — Inpatient Hospital Stay
Admission: EM | Admit: 2021-12-20 | Discharge: 2021-12-24 | DRG: 071 | Disposition: A | Discharge status: Skilled Nursing Facility | Payer: Medicare Other | Attending: Internal Medicine | Admitting: Internal Medicine

## 2021-12-20 DIAGNOSIS — S62511A Displaced fracture of proximal phalanx of right thumb, initial encounter for closed fracture: Secondary | ICD-10-CM | POA: Diagnosis present

## 2021-12-20 DIAGNOSIS — E7800 Pure hypercholesterolemia, unspecified: Secondary | ICD-10-CM | POA: Diagnosis present

## 2021-12-20 DIAGNOSIS — R001 Bradycardia, unspecified: Secondary | ICD-10-CM | POA: Diagnosis present

## 2021-12-20 DIAGNOSIS — Z9841 Cataract extraction status, right eye: Secondary | ICD-10-CM

## 2021-12-20 DIAGNOSIS — S32039A Unspecified fracture of third lumbar vertebra, initial encounter for closed fracture: Secondary | ICD-10-CM | POA: Diagnosis present

## 2021-12-20 DIAGNOSIS — D72829 Elevated white blood cell count, unspecified: Secondary | ICD-10-CM | POA: Diagnosis present

## 2021-12-20 DIAGNOSIS — R350 Frequency of micturition: Secondary | ICD-10-CM | POA: Diagnosis present

## 2021-12-20 DIAGNOSIS — I5032 Chronic diastolic (congestive) heart failure: Secondary | ICD-10-CM | POA: Diagnosis present

## 2021-12-20 DIAGNOSIS — N179 Acute kidney failure, unspecified: Secondary | ICD-10-CM | POA: Diagnosis present

## 2021-12-20 DIAGNOSIS — S80211A Abrasion, right knee, initial encounter: Secondary | ICD-10-CM | POA: Diagnosis present

## 2021-12-20 DIAGNOSIS — G8929 Other chronic pain: Secondary | ICD-10-CM | POA: Diagnosis present

## 2021-12-20 DIAGNOSIS — G9341 Metabolic encephalopathy: Secondary | ICD-10-CM | POA: Diagnosis not present

## 2021-12-20 DIAGNOSIS — M5442 Lumbago with sciatica, left side: Secondary | ICD-10-CM | POA: Diagnosis present

## 2021-12-20 DIAGNOSIS — R300 Dysuria: Secondary | ICD-10-CM | POA: Diagnosis present

## 2021-12-20 DIAGNOSIS — Z9181 History of falling: Secondary | ICD-10-CM

## 2021-12-20 DIAGNOSIS — S62501A Fracture of unspecified phalanx of right thumb, initial encounter for closed fracture: Secondary | ICD-10-CM

## 2021-12-20 DIAGNOSIS — Z79890 Hormone replacement therapy (postmenopausal): Secondary | ICD-10-CM

## 2021-12-20 DIAGNOSIS — M5441 Lumbago with sciatica, right side: Secondary | ICD-10-CM | POA: Diagnosis present

## 2021-12-20 DIAGNOSIS — G629 Polyneuropathy, unspecified: Secondary | ICD-10-CM | POA: Diagnosis present

## 2021-12-20 DIAGNOSIS — I130 Hypertensive heart and chronic kidney disease with heart failure and stage 1 through stage 4 chronic kidney disease, or unspecified chronic kidney disease: Secondary | ICD-10-CM | POA: Diagnosis present

## 2021-12-20 DIAGNOSIS — Z85828 Personal history of other malignant neoplasm of skin: Secondary | ICD-10-CM

## 2021-12-20 DIAGNOSIS — G9340 Encephalopathy, unspecified: Secondary | ICD-10-CM

## 2021-12-20 DIAGNOSIS — R000 Tachycardia, unspecified: Secondary | ICD-10-CM

## 2021-12-20 DIAGNOSIS — E039 Hypothyroidism, unspecified: Secondary | ICD-10-CM | POA: Diagnosis present

## 2021-12-20 DIAGNOSIS — Y92009 Unspecified place in unspecified non-institutional (private) residence as the place of occurrence of the external cause: Secondary | ICD-10-CM

## 2021-12-20 DIAGNOSIS — W19XXXA Unspecified fall, initial encounter: Secondary | ICD-10-CM | POA: Diagnosis present

## 2021-12-20 DIAGNOSIS — I4891 Unspecified atrial fibrillation: Secondary | ICD-10-CM | POA: Diagnosis present

## 2021-12-20 DIAGNOSIS — S32049A Unspecified fracture of fourth lumbar vertebra, initial encounter for closed fracture: Secondary | ICD-10-CM | POA: Diagnosis present

## 2021-12-20 DIAGNOSIS — Z96642 Presence of left artificial hip joint: Secondary | ICD-10-CM | POA: Diagnosis present

## 2021-12-20 DIAGNOSIS — E538 Deficiency of other specified B group vitamins: Secondary | ICD-10-CM | POA: Diagnosis present

## 2021-12-20 DIAGNOSIS — Z961 Presence of intraocular lens: Secondary | ICD-10-CM | POA: Diagnosis present

## 2021-12-20 DIAGNOSIS — L89890 Pressure ulcer of other site, unstageable: Secondary | ICD-10-CM | POA: Diagnosis present

## 2021-12-20 DIAGNOSIS — G2581 Restless legs syndrome: Secondary | ICD-10-CM | POA: Diagnosis present

## 2021-12-20 DIAGNOSIS — Z8673 Personal history of transient ischemic attack (TIA), and cerebral infarction without residual deficits: Secondary | ICD-10-CM

## 2021-12-20 DIAGNOSIS — N1832 Chronic kidney disease, stage 3b: Secondary | ICD-10-CM | POA: Diagnosis present

## 2021-12-20 DIAGNOSIS — E860 Dehydration: Secondary | ICD-10-CM | POA: Diagnosis present

## 2021-12-20 DIAGNOSIS — Z79899 Other long term (current) drug therapy: Secondary | ICD-10-CM

## 2021-12-20 DIAGNOSIS — Z9842 Cataract extraction status, left eye: Secondary | ICD-10-CM

## 2021-12-20 NOTE — ED Triage Notes (Signed)
Pt from home due to witnessed fall.  No LOC, no thinners, did not hit head.  Family reported to EMS that pt was "talking out of her head all day."  Pt c/o pain in R thumb, R knee, lower back.

## 2021-12-21 ENCOUNTER — Other Ambulatory Visit: Payer: Self-pay

## 2021-12-21 ENCOUNTER — Encounter: Payer: Self-pay | Admitting: Internal Medicine

## 2021-12-21 DIAGNOSIS — E860 Dehydration: Secondary | ICD-10-CM | POA: Diagnosis present

## 2021-12-21 DIAGNOSIS — G8929 Other chronic pain: Secondary | ICD-10-CM | POA: Diagnosis present

## 2021-12-21 DIAGNOSIS — I4891 Unspecified atrial fibrillation: Secondary | ICD-10-CM | POA: Diagnosis present

## 2021-12-21 DIAGNOSIS — N179 Acute kidney failure, unspecified: Secondary | ICD-10-CM | POA: Diagnosis present

## 2021-12-21 DIAGNOSIS — M5442 Lumbago with sciatica, left side: Secondary | ICD-10-CM | POA: Diagnosis present

## 2021-12-21 DIAGNOSIS — Z8673 Personal history of transient ischemic attack (TIA), and cerebral infarction without residual deficits: Secondary | ICD-10-CM | POA: Diagnosis not present

## 2021-12-21 DIAGNOSIS — G9341 Metabolic encephalopathy: Secondary | ICD-10-CM

## 2021-12-21 DIAGNOSIS — R000 Tachycardia, unspecified: Secondary | ICD-10-CM | POA: Diagnosis present

## 2021-12-21 DIAGNOSIS — E7800 Pure hypercholesterolemia, unspecified: Secondary | ICD-10-CM | POA: Diagnosis present

## 2021-12-21 DIAGNOSIS — S32039A Unspecified fracture of third lumbar vertebra, initial encounter for closed fracture: Secondary | ICD-10-CM | POA: Diagnosis present

## 2021-12-21 DIAGNOSIS — I5032 Chronic diastolic (congestive) heart failure: Secondary | ICD-10-CM | POA: Diagnosis present

## 2021-12-21 DIAGNOSIS — G629 Polyneuropathy, unspecified: Secondary | ICD-10-CM | POA: Diagnosis present

## 2021-12-21 DIAGNOSIS — M5441 Lumbago with sciatica, right side: Secondary | ICD-10-CM | POA: Diagnosis present

## 2021-12-21 DIAGNOSIS — L89890 Pressure ulcer of other site, unstageable: Secondary | ICD-10-CM | POA: Diagnosis present

## 2021-12-21 DIAGNOSIS — E538 Deficiency of other specified B group vitamins: Secondary | ICD-10-CM | POA: Diagnosis present

## 2021-12-21 DIAGNOSIS — S62501A Fracture of unspecified phalanx of right thumb, initial encounter for closed fracture: Secondary | ICD-10-CM

## 2021-12-21 DIAGNOSIS — S62511A Displaced fracture of proximal phalanx of right thumb, initial encounter for closed fracture: Secondary | ICD-10-CM | POA: Diagnosis present

## 2021-12-21 DIAGNOSIS — N1832 Chronic kidney disease, stage 3b: Secondary | ICD-10-CM | POA: Diagnosis present

## 2021-12-21 DIAGNOSIS — S32049A Unspecified fracture of fourth lumbar vertebra, initial encounter for closed fracture: Secondary | ICD-10-CM | POA: Diagnosis present

## 2021-12-21 DIAGNOSIS — E039 Hypothyroidism, unspecified: Secondary | ICD-10-CM | POA: Diagnosis present

## 2021-12-21 DIAGNOSIS — G2581 Restless legs syndrome: Secondary | ICD-10-CM | POA: Diagnosis present

## 2021-12-21 DIAGNOSIS — S80211A Abrasion, right knee, initial encounter: Secondary | ICD-10-CM | POA: Diagnosis present

## 2021-12-21 DIAGNOSIS — I130 Hypertensive heart and chronic kidney disease with heart failure and stage 1 through stage 4 chronic kidney disease, or unspecified chronic kidney disease: Secondary | ICD-10-CM | POA: Diagnosis present

## 2021-12-21 DIAGNOSIS — W19XXXA Unspecified fall, initial encounter: Secondary | ICD-10-CM | POA: Diagnosis present

## 2021-12-21 DIAGNOSIS — Y92009 Unspecified place in unspecified non-institutional (private) residence as the place of occurrence of the external cause: Secondary | ICD-10-CM | POA: Diagnosis not present

## 2021-12-21 DIAGNOSIS — D72829 Elevated white blood cell count, unspecified: Secondary | ICD-10-CM | POA: Diagnosis present

## 2021-12-21 LAB — CBC
HCT: 34.3 % — ABNORMAL LOW (ref 36.0–46.0)
Hemoglobin: 11.4 g/dL — ABNORMAL LOW (ref 12.0–15.0)
MCH: 30.2 pg (ref 26.0–34.0)
MCHC: 33.2 g/dL (ref 30.0–36.0)
MCV: 90.7 fL (ref 80.0–100.0)
Platelets: 246 10*3/uL (ref 150–400)
RBC: 3.78 MIL/uL — ABNORMAL LOW (ref 3.87–5.11)
RDW: 12.9 % (ref 11.5–15.5)
WBC: 11.4 10*3/uL — ABNORMAL HIGH (ref 4.0–10.5)
nRBC: 0 % (ref 0.0–0.2)

## 2021-12-21 LAB — CBC WITH DIFFERENTIAL/PLATELET
Abs Immature Granulocytes: 0.05 10*3/uL (ref 0.00–0.07)
Basophils Absolute: 0 10*3/uL (ref 0.0–0.1)
Basophils Relative: 0 %
Eosinophils Absolute: 0 10*3/uL (ref 0.0–0.5)
Eosinophils Relative: 0 %
HCT: 35 % — ABNORMAL LOW (ref 36.0–46.0)
Hemoglobin: 11.7 g/dL — ABNORMAL LOW (ref 12.0–15.0)
Immature Granulocytes: 0 %
Lymphocytes Relative: 10 %
Lymphs Abs: 1.3 10*3/uL (ref 0.7–4.0)
MCH: 30.6 pg (ref 26.0–34.0)
MCHC: 33.4 g/dL (ref 30.0–36.0)
MCV: 91.6 fL (ref 80.0–100.0)
Monocytes Absolute: 1.2 10*3/uL — ABNORMAL HIGH (ref 0.1–1.0)
Monocytes Relative: 9 %
Neutro Abs: 10.1 10*3/uL — ABNORMAL HIGH (ref 1.7–7.7)
Neutrophils Relative %: 81 %
Platelets: 244 10*3/uL (ref 150–400)
RBC: 3.82 MIL/uL — ABNORMAL LOW (ref 3.87–5.11)
RDW: 12.9 % (ref 11.5–15.5)
WBC: 12.6 10*3/uL — ABNORMAL HIGH (ref 4.0–10.5)
nRBC: 0 % (ref 0.0–0.2)

## 2021-12-21 LAB — COMPREHENSIVE METABOLIC PANEL
ALT: 13 U/L (ref 0–44)
AST: 26 U/L (ref 15–41)
Albumin: 3.9 g/dL (ref 3.5–5.0)
Alkaline Phosphatase: 88 U/L (ref 38–126)
Anion gap: 8 (ref 5–15)
BUN: 32 mg/dL — ABNORMAL HIGH (ref 8–23)
CO2: 26 mmol/L (ref 22–32)
Calcium: 8.7 mg/dL — ABNORMAL LOW (ref 8.9–10.3)
Chloride: 101 mmol/L (ref 98–111)
Creatinine, Ser: 1.62 mg/dL — ABNORMAL HIGH (ref 0.44–1.00)
GFR, Estimated: 30 mL/min — ABNORMAL LOW (ref >60)
Glucose, Bld: 139 mg/dL — ABNORMAL HIGH (ref 70–99)
Potassium: 3.7 mmol/L (ref 3.5–5.1)
Sodium: 135 mmol/L (ref 135–145)
Total Bilirubin: 1.3 mg/dL — ABNORMAL HIGH (ref 0.3–1.2)
Total Protein: 7.7 g/dL (ref 6.5–8.1)

## 2021-12-21 LAB — URINALYSIS, COMPLETE (UACMP) WITH MICROSCOPIC
Bacteria, UA: NONE SEEN
Bilirubin Urine: NEGATIVE
Glucose, UA: NEGATIVE mg/dL
Ketones, ur: NEGATIVE mg/dL
Leukocytes,Ua: NEGATIVE
Nitrite: NEGATIVE
Protein, ur: 30 mg/dL — AB
Specific Gravity, Urine: 1.01 (ref 1.005–1.030)
pH: 5 (ref 5.0–8.0)

## 2021-12-21 LAB — TSH: TSH: 4.312 u[IU]/mL (ref 0.350–4.500)

## 2021-12-21 LAB — CREATININE, SERUM
Creatinine, Ser: 1.37 mg/dL — ABNORMAL HIGH (ref 0.44–1.00)
GFR, Estimated: 37 mL/min — ABNORMAL LOW (ref >60)

## 2021-12-21 LAB — LACTIC ACID, PLASMA: Lactic Acid, Venous: 1.7 mmol/L (ref 0.5–1.9)

## 2021-12-21 LAB — VITAMIN B12: Vitamin B-12: <50 pg/mL — ABNORMAL LOW (ref 180–914)

## 2021-12-21 LAB — AMMONIA: Ammonia: <10 umol/L (ref 9–35)

## 2021-12-21 LAB — MAGNESIUM: Magnesium: 1.4 mg/dL — ABNORMAL LOW (ref 1.7–2.4)

## 2021-12-21 MED ORDER — ONDANSETRON HCL 4 MG PO TABS: 4.0000 mg | ORAL_TABLET | Freq: Four times a day (QID) | ORAL | Status: DC | PRN

## 2021-12-21 MED ORDER — METOPROLOL TARTRATE 50 MG PO TABS
50.0000 mg | ORAL_TABLET | Freq: Two times a day (BID) | ORAL | Status: DC
  Administered 2021-12-21: 50 mg via ORAL
  Filled 2021-12-21: qty 1

## 2021-12-21 MED ORDER — LACTATED RINGERS IV BOLUS
1000.0000 mL | Freq: Once | INTRAVENOUS | Status: AC
  Administered 2021-12-21: 1000 mL via INTRAVENOUS

## 2021-12-21 MED ORDER — VITAMIN D 25 MCG (1000 UNIT) PO TABS
1000.0000 [IU] | ORAL_TABLET | Freq: Every day | ORAL | Status: DC
  Administered 2021-12-21 – 2021-12-24 (×4): 1000 [IU] via ORAL
  Filled 2021-12-21 (×4): qty 1

## 2021-12-21 MED ORDER — GUAIFENESIN 100 MG/5ML PO LIQD: 5.0000 mL | ORAL | Status: DC | PRN

## 2021-12-21 MED ORDER — METOPROLOL TARTRATE 25 MG PO TABS
37.5000 mg | ORAL_TABLET | Freq: Two times a day (BID) | ORAL | Status: DC
  Administered 2021-12-21: 37.5 mg via ORAL
  Filled 2021-12-21: qty 2

## 2021-12-21 MED ORDER — PRAVASTATIN SODIUM 20 MG PO TABS
20.0000 mg | ORAL_TABLET | Freq: Every day | ORAL | Status: DC
  Administered 2021-12-21 – 2021-12-23 (×3): 20 mg via ORAL
  Filled 2021-12-21 (×3): qty 1

## 2021-12-21 MED ORDER — SODIUM CHLORIDE 0.9 % IV SOLN: INTRAVENOUS | Status: DC

## 2021-12-21 MED ORDER — TRAZODONE HCL 50 MG PO TABS
50.0000 mg | ORAL_TABLET | Freq: Every evening | ORAL | Status: DC | PRN
  Administered 2021-12-22: 50 mg via ORAL
  Filled 2021-12-21 (×2): qty 1

## 2021-12-21 MED ORDER — BENAZEPRIL HCL 10 MG PO TABS
10.0000 mg | ORAL_TABLET | Freq: Every day | ORAL | Status: DC
  Administered 2021-12-21 – 2021-12-23 (×3): 10 mg via ORAL
  Filled 2021-12-21 (×4): qty 1

## 2021-12-21 MED ORDER — METOPROLOL TARTRATE 5 MG/5ML IV SOLN
5.0000 mg | INTRAVENOUS | Status: DC | PRN
  Administered 2021-12-21 (×2): 5 mg via INTRAVENOUS
  Filled 2021-12-21 (×2): qty 5

## 2021-12-21 MED ORDER — SODIUM CHLORIDE 0.9 % IV SOLN: INTRAVENOUS | Status: AC

## 2021-12-21 MED ORDER — AMLODIPINE BESYLATE 10 MG PO TABS
10.0000 mg | ORAL_TABLET | Freq: Every day | ORAL | Status: DC
  Administered 2021-12-21 – 2021-12-23 (×3): 10 mg via ORAL
  Filled 2021-12-21 (×4): qty 1

## 2021-12-21 MED ORDER — ACETAMINOPHEN 650 MG RE SUPP: 650.0000 mg | Freq: Four times a day (QID) | RECTAL | Status: DC | PRN

## 2021-12-21 MED ORDER — GABAPENTIN 300 MG PO CAPS
300.0000 mg | ORAL_CAPSULE | Freq: Three times a day (TID) | ORAL | Status: DC
  Administered 2021-12-21 – 2021-12-24 (×11): 300 mg via ORAL
  Filled 2021-12-21 (×11): qty 1

## 2021-12-21 MED ORDER — LEVOTHYROXINE SODIUM 100 MCG PO TABS
100.0000 ug | ORAL_TABLET | Freq: Every day | ORAL | Status: DC
  Administered 2021-12-21 – 2021-12-24 (×4): 100 ug via ORAL
  Filled 2021-12-21 (×4): qty 1

## 2021-12-21 MED ORDER — HYDROCODONE-ACETAMINOPHEN 5-325 MG PO TABS
1.0000 | ORAL_TABLET | ORAL | Status: DC | PRN
  Administered 2021-12-21 – 2021-12-23 (×4): 2 via ORAL
  Administered 2021-12-24: 1 via ORAL
  Filled 2021-12-21 (×2): qty 2
  Filled 2021-12-21: qty 1
  Filled 2021-12-21 (×2): qty 2

## 2021-12-21 MED ORDER — ALBUTEROL SULFATE (2.5 MG/3ML) 0.083% IN NEBU: 2.5000 mg | INHALATION_SOLUTION | Freq: Four times a day (QID) | RESPIRATORY_TRACT | Status: DC | PRN

## 2021-12-21 MED ORDER — ENSURE ENLIVE PO LIQD
237.0000 mL | Freq: Three times a day (TID) | ORAL | Status: DC
  Administered 2021-12-21 – 2021-12-24 (×7): 237 mL via ORAL

## 2021-12-21 MED ORDER — ROPINIROLE HCL 0.25 MG PO TABS
0.2500 mg | ORAL_TABLET | Freq: Every day | ORAL | Status: DC
  Administered 2021-12-21 – 2021-12-23 (×3): 0.25 mg via ORAL
  Filled 2021-12-21 (×4): qty 1

## 2021-12-21 MED ORDER — ONDANSETRON HCL 4 MG/2ML IJ SOLN: 4.0000 mg | Freq: Four times a day (QID) | INTRAMUSCULAR | Status: DC | PRN

## 2021-12-21 MED ORDER — ENOXAPARIN SODIUM 30 MG/0.3ML IJ SOSY
30.0000 mg | PREFILLED_SYRINGE | INTRAMUSCULAR | Status: DC
  Administered 2021-12-21 – 2021-12-24 (×4): 30 mg via SUBCUTANEOUS
  Filled 2021-12-21 (×4): qty 0.3

## 2021-12-21 MED ORDER — CYANOCOBALAMIN 1000 MCG/ML IJ SOLN
1000.0000 ug | Freq: Once | INTRAMUSCULAR | Status: AC
  Administered 2021-12-21: 1000 ug via INTRAMUSCULAR
  Filled 2021-12-21: qty 1

## 2021-12-21 MED ORDER — HYDRALAZINE HCL 20 MG/ML IJ SOLN: 10.0000 mg | INTRAMUSCULAR | Status: DC | PRN

## 2021-12-21 MED ORDER — DOCUSATE SODIUM 100 MG PO CAPS: 100.0000 mg | ORAL_CAPSULE | Freq: Two times a day (BID) | ORAL | Status: DC | PRN

## 2021-12-21 MED ORDER — SENNOSIDES-DOCUSATE SODIUM 8.6-50 MG PO TABS: 1.0000 | ORAL_TABLET | Freq: Every evening | ORAL | Status: DC | PRN

## 2021-12-21 MED ORDER — ACETAMINOPHEN 325 MG PO TABS
650.0000 mg | ORAL_TABLET | Freq: Four times a day (QID) | ORAL | Status: DC | PRN
  Administered 2021-12-22 – 2021-12-23 (×3): 650 mg via ORAL
  Filled 2021-12-21 (×4): qty 2

## 2021-12-21 MED ORDER — MAGNESIUM SULFATE 2 GM/50ML IV SOLN
2.0000 g | Freq: Once | INTRAVENOUS | Status: AC
  Administered 2021-12-21: 2 g via INTRAVENOUS
  Filled 2021-12-21: qty 50

## 2021-12-21 MED ORDER — METOPROLOL TARTRATE 25 MG PO TABS: 25.0000 mg | ORAL_TABLET | Freq: Two times a day (BID) | ORAL | Status: DC

## 2021-12-21 MED ORDER — IPRATROPIUM-ALBUTEROL 0.5-2.5 (3) MG/3ML IN SOLN: 3.0000 mL | RESPIRATORY_TRACT | Status: DC | PRN

## 2021-12-21 NOTE — Assessment & Plan Note (Signed)
IV hydration and monitor 

## 2021-12-21 NOTE — Progress Notes (Signed)
PT Cancellation Note  Patient Details Name: Theresa Snyder MRN: [id number] DOB: [birth year]   Cancelled Treatment:    Reason Eval/Treat Not Completed: Patient not medically ready PT orders received, chart reviewed. Per telemetry monitor pt's HR 170-190s bpm, rechecked ~20 minutes later & still 120s-130s bpm. Pt not appropriate for PT evaluation at this time 2/2 elevated HR. Will f/u as able & as pt is medically appropriate.  Lavone Nian, PT, DPT 12/21/21, 9:34 AM  Waunita Schooner 12/21/2021, 9:33 AM

## 2021-12-21 NOTE — Assessment & Plan Note (Signed)
Superior endplate fracture of L3 and stable L4 compression deformity. Neurosurgery was consulted from the ED: No TLSO or additional imaging recommended at this time Neurosurgery consult to follow Pain control Fall precautions

## 2021-12-21 NOTE — Progress Notes (Signed)
Spoke with the team in the ED about this patient. Appears to have an age indeterminate end plate fracture with no retropuluson or localizable neurological deficits. At this point doesn't require advanced imaging. Will see on morning rounds this morning to provide full consultation.

## 2021-12-21 NOTE — Progress Notes (Signed)
PROGRESS NOTE    Theresa Snyder  QBH:[id number] DOB: Nov 04, 1929 DOA: 12/20/2021 PCP: [facility] [place]   Brief Narrative:  88 year old female with history of HTN, hypothyroidism, CKD stage III diastolic CHF, TIA comes to the hospital for evaluation of fall.  Patient has been somewhat confused at home.  Upon admission patient was noted to be tachycardic, mildly elevated creatinine, leukocytosis.  Lumbar x-ray showed compression fracture.  Patient was seen by neurosurgery and recommended conservative management.   Assessment & Plan:  Principal Problem:   Acute metabolic encephalopathy Active Problems:   Fall at home, initial encounter   Sinus tachycardia   Closed fracture of right thumb   Hypomagnesemia   Hypothyroidism     Assessment and Plan: * Acute metabolic encephalopathy Unknown cause. UA is neg. Will check TSH, Ammonia, B12 and Folate.  CT head not performed during admission??? No focal deficits, PT/OT  Fall at home, initial encounter Superior endplate fracture of L3 and stable L4 compression deformity. Seen by NeuroSx, conservative management. Supportive care.  No focal Neuro deficits.   Sinus tachycardia, now Afib rvr Essential HTN Gentle hydration.  Increase Metoprolol to 37.5 mg bid, iv prn ordered Cont home Norvasc, Benazepril Would avoid AC, she is a high risk with multiple falls.   HLD -Statin  Peripheral Neuropathy RLS Cont gabapentin.  Also on Home Requip at Bedtime.   Hypomagnesemia Replete prn.   Closed fracture of right thumb Received splinted in the ED  Chronic kidney disease Stage 3a(HCC) IV hydration and monitor. Cr at baseline of 1.5 No evidence of AKI    DVT prophylaxis: enoxaparin (LOVENOX) injection 30 mg Start: 12/21/21 0800 Code Status: Full Family Communication:    Off and on A fib with rvr, not ready to dc. Needs better HR control.    Nutritional status          Body mass index is 24.54  kg/m.  Pressure Injury 12/21/21 Toe (Comment  which one) Left Unstageable - Full thickness tissue loss in which the base of the injury is covered by slough (yellow, tan, gray, green or brown) and/or eschar (tan, brown or black) in the wound bed. unstageable pre (Active)  12/21/21 0416  Location: Toe (Comment  which one)  Location Orientation: Left  Staging: Unstageable - Full thickness tissue loss in which the base of the injury is covered by slough (yellow, tan, gray, green or brown) and/or eschar (tan, brown or black) in the wound bed.  Wound Description (Comments): unstageable pressure ulcer to left big toe  Present on Admission: Yes        Subjective:  Feels ok, but her HR fluctuating between 105-145. Just received her BB.   Examination:  Constitutional: Not in acute distress. Elderly frail.  Respiratory: Clear to auscultation bilaterally Cardiovascular: irregularly irregularly.  Abdomen: Nontender nondistended good bowel sounds Musculoskeletal: No edema noted. Right arm splint in place.  Skin: No rashes seen Neurologic: CN 2-12 grossly intact.  And nonfocal Psychiatric: Normal judgment and insight. Alert and oriented x 3. Normal mood.     Objective: Vitals:   12/21/21 0230 12/21/21 0300 12/21/21 0357 12/21/21 0740  BP:  (!) 177/86 (!) 173/75 (!) 153/87  Pulse: (!) 107 (!) 109 100 91  Resp: '19 20 18 16  '$ Temp:   98.3 F (36.8 C) 97.9 F (36.6 C)  TempSrc:   Oral Oral  SpO2: 100% 99% 96% 96%  Weight:   57 kg   Height:   5' (1.524 m)  Intake/Output Summary (Last 24 hours) at 12/21/2021 0801 Last data filed at 12/21/2021 0600 Gross per 24 hour  Intake 1210.54 ml  Output --  Net 1210.54 ml   Filed Weights   12/20/21 2312 12/21/21 0357  Weight: 55.3 kg 57 kg     Data Reviewed:   CBC: Recent Labs  Lab 12/20/21 2315 12/21/21 0530  WBC 12.6* 11.4*  NEUTROABS 10.1*  --   HGB 11.7* 11.4*  HCT 35.0* 34.3*  MCV 91.6 90.7  PLT 244 595   Basic  Metabolic Panel: Recent Labs  Lab 12/20/21 2315 12/21/21 0530  NA 135  --   K 3.7  --   CL 101  --   CO2 26  --   GLUCOSE 139*  --   BUN 32*  --   CREATININE 1.62* 1.37*  CALCIUM 8.7*  --   MG 1.4*  --    GFR: Estimated Creatinine Clearance: 21.6 mL/min (A) (by C-G formula based on SCr of 1.37 mg/dL (H)). Liver Function Tests: Recent Labs  Lab 12/20/21 2315  AST 26  ALT 13  ALKPHOS 88  BILITOT 1.3*  PROT 7.7  ALBUMIN 3.9   No results for input(s): "LIPASE", "AMYLASE" in the last 168 hours. No results for input(s): "AMMONIA" in the last 168 hours. Coagulation Profile: No results for input(s): "INR", "PROTIME" in the last 168 hours. Cardiac Enzymes: No results for input(s): "CKTOTAL", "CKMB", "CKMBINDEX", "TROPONINI" in the last 168 hours. BNP (last 3 results) No results for input(s): "PROBNP" in the last 8760 hours. HbA1C: No results for input(s): "HGBA1C" in the last 72 hours. CBG: No results for input(s): "GLUCAP" in the last 168 hours. Lipid Profile: No results for input(s): "CHOL", "HDL", "LDLCALC", "TRIG", "CHOLHDL", "LDLDIRECT" in the last 72 hours. Thyroid Function Tests: No results for input(s): "TSH", "T4TOTAL", "FREET4", "T3FREE", "THYROIDAB" in the last 72 hours. Anemia Panel: No results for input(s): "VITAMINB12", "FOLATE", "FERRITIN", "TIBC", "IRON", "RETICCTPCT" in the last 72 hours. Sepsis Labs: Recent Labs  Lab 12/20/21 2315  LATICACIDVEN 1.7    Recent Results (from the past 240 hour(s))  Blood culture (routine single)     Status: None (Preliminary result)   Collection Time: 12/20/21 11:15 PM   Specimen: BLOOD  Result Value Ref Range Status   Specimen Description BLOOD BOTTLES DRAWN AEROBIC AND ANAEROBIC  Final   Special Requests BLOOD Blood Culture adequate volume  Final   Culture   Final    NO GROWTH < 12 HOURS Performed at [facility], [place] [place], [place] [address]    Report Status PENDING  Incomplete          Radiology Studies: DG Knee 2 Views Right  Result Date: 12/21/2021 CLINICAL DATA:  Right knee pain, fall EXAM: RIGHT KNEE - 1-2 VIEW COMPARISON:  None Available. FINDINGS: Normal alignment. No acute fracture or dislocation. Joint spaces are not optimally profiled but appear preserved. No effusion. Soft tissues are unremarkable. IMPRESSION: 1. Negative. Electronically Signed   By: Fidela Salisbury M.D.   On: 12/21/2021 00:00   DG Lumbar Spine 2-3 Views  Result Date: 12/20/2021 CLINICAL DATA:  Fall, back pain EXAM: LUMBAR SPINE - 2-3 VIEW COMPARISON:  03/24/2020 FINDINGS: Compression deformity of L4 is again identified with 50-60% loss of height. Age indeterminate superior endplate fracture of L3 has developed. No retropulsion. Remaining vertebral body height is preserved. There is diffuse intervertebral disc space narrowing and endplate remodeling throughout the visualized thoracolumbar spine, most severe at T11-L2 in keeping  with changes of moderate to severe degenerative disc disease. Vascular calcifications are seen within the abdominal aorta. IMPRESSION: 1. Age indeterminate superior endplate fracture of L3. 2. Stable L4 compression deformity. 3. Moderate to severe degenerative disc disease. Electronically Signed   By: Fidela Salisbury M.D.   On: 12/20/2021 23:59   DG Hand Complete Right  Result Date: 12/20/2021 CLINICAL DATA:  Fall, right hand pain EXAM: RIGHT HAND - COMPLETE 3+ VIEW COMPARISON:  None Available. FINDINGS: There is an acute, comminuted intra-articular fracture of the base of the right first proximal phalanx with a 1 mm articular gap noted. Extensive soft tissue swelling of the right thumb. No other fracture or dislocation identified. Osseous structures are diffusely osteopenic. Degenerative changes are noted at the base of the thumb and second distal interphalangeal joint. IMPRESSION: 1. Acute, comminuted intra-articular fracture of the base of the right first proximal  phalanx. Electronically Signed   By: Fidela Salisbury M.D.   On: 12/20/2021 23:53   DG Chest 1 View  Result Date: 12/20/2021 CLINICAL DATA:  Fall, chest pain EXAM: CHEST  1 VIEW COMPARISON:  10/31/2021 FINDINGS: Large hiatal hernia again identified containing the transverse colon. Lungs are clear. No pneumothorax or pleural effusion. Cardiac size within normal limits. Pulmonary vascularity is normal. Healed right rib fracture noted. No acute bone abnormality. IMPRESSION: 1. No active disease. 2. Large hiatal hernia. Electronically Signed   By: Fidela Salisbury M.D.   On: 12/20/2021 23:36        Scheduled Meds:  amLODipine  10 mg Oral Q breakfast   benazepril  10 mg Oral Daily   enoxaparin (LOVENOX) injection  30 mg Subcutaneous Q24H   levothyroxine  100 mcg Oral Q0600   metoprolol tartrate  25 mg Oral BID   pravastatin  20 mg Oral QHS   Continuous Infusions:  sodium chloride 100 mL/hr at 12/21/21 0530     LOS: 0 days   Time spent= 35 mins    Keishon Chavarin Arsenio Loader, MD [facility]  If 7PM-7AM, please contact night-coverage  12/21/2021, 8:01 AM

## 2021-12-21 NOTE — Progress Notes (Signed)
OT Cancellation Note  Patient Details Name: Theresa Snyder MRN: [id number] DOB: [birth year]   Cancelled Treatment:    Reason Eval/Treat Not Completed: Patient not medically ready. OT orders received, chart reviewed. Per telemetry monitor pt's HR 170-190s bpm, rechecked ~20 minutes later & still 120s-130s bpm. Pt not appropriate for OT evaluation at this time 2/2 elevated HR. Will f/u as able and as pt is medically appropriate.   Doneta Public 12/21/2021, 12:49 PM

## 2021-12-21 NOTE — H&P (Addendum)
History and Physical    Patient: Theresa Snyder QMV:[id number] DOB: 1929/07/26 DOA: 12/20/2021 DOS: the patient was seen and examined on 12/21/2021 PCP: [facility] [place]  Patient coming from: Home  Chief Complaint:  Chief Complaint  Patient presents with   Fall    HPI: Theresa Snyder is a 88 y.o. female with medical history significant for HTN, hypothyroidism, CKD 3B, diastolic CHF, hospitalized in September 2023 with acute encephalopathy attributed to dehydration and possible TIA (MRI negative for CVA) Who was brought to the ED from home following a witnessed fall.  Family who is no longer at bedside, reported that patient had been "talking out of her head all day".  Patient sustained an injury to her right thumb and low back.  On evaluation, patient appears very animated and talkative and is oriented to person and place. ED course and data review: BP 118/103.  Persistently tachycardic 103-110 with otherwise normal vitals.  Labs significant for WBC of 12,600 with normal lactic acid of 1.7.  Creatinine is at 1.62 slightly above her baseline of 1.49.  Magnesium 1.4, urinalysis sterile.  Hemoglobin at baseline at 11.7.  EKG, personally viewed and interpreted shows sinus at 94 with no acute ST-T wave changes. Trauma imaging significant for fracture right thumb.  Lumbar x-rays showing age-indeterminate superior endplate fracture of L3 and stable L4 compression deformity. The ED provider spoke with neurosurgeon, Dr. Tamala Julian who did not recommend any additional imaging at this time but will see patient in the a.m.  Patient's thumb was splinted in the ED, magnesium supplemented and she was given a fluid bolus.  Hospitalist consulted for admission due to concern for altered mental status and persistent tachycardia.     Past Medical History:  Diagnosis Date   Arthritis    Cancer ([place])    skin   Chronic kidney disease    history of renal insufficiency   History of hiatal  hernia    Hypercholesteremia    Hypertension    Hypothyroidism    Lymphedema of leg    Pneumonia    in the past   Seizures ([place]) 2014   no seizures since then   Spinal stenosis    Past Surgical History:  Procedure Laterality Date   APPENDECTOMY     CATARACT EXTRACTION W/PHACO Left 09/25/2015   Procedure: CATARACT EXTRACTION PHACO AND INTRAOCULAR LENS PLACEMENT ([place]);  Surgeon: Estill Cotta, MD;  Location: [facility] ORS;  Service: Ophthalmology;  Laterality: Left;  [place] 01:23 AP% 24.0 CDE 34.98 Fluid pack lot # [id number]   CATARACT EXTRACTION W/PHACO Right 07/01/2016   Procedure: CATARACT EXTRACTION PHACO AND INTRAOCULAR LENS PLACEMENT ([facility]);  Surgeon: Estill Cotta, MD;  Location: [facility] ORS;  Service: Ophthalmology;  Laterality: Right;  [place] 1:25.9 AP% 24.8 CDE 40.61 Fluid Pack Lot # [id number]   HIP ARTHROPLASTY Left 07/10/2021   Procedure: ARTHROPLASTY BIPOLAR HIP (HEMIARTHROPLASTY);  Surgeon: Renee Harder, MD;  Location: [facility] ORS;  Service: Orthopedics;  Laterality: Left;   TUBAL LIGATION     Social History:  reports that she has never smoked. She has never used smokeless tobacco. She reports that she does not drink alcohol and does not use drugs.  Allergies  Allergen Reactions   Iodinated Contrast Media Other (See Comments)    Blue, Green and Red dyes    Lorazepam     Altered mental state   Risperidone Other (See Comments)    Altered mental state    Sulfamethoxazole-Trimethoprim     Hyper  No family history on file.  Prior to Admission medications   Medication Sig Start Date End Date Taking? Authorizing Provider  benazepril (LOTENSIN) 10 MG tablet Take 1 tablet (10 mg total) by mouth daily. 07/15/21  Yes Swayze, Ava, DO  cholecalciferol (VITAMIN D3) 25 MCG (1000 UNIT) tablet Take 1,000 Units by mouth daily. 12/19/21  Yes [provider]  furosemide (LASIX) 20 MG tablet Take 1 tablet (20 mg total) by mouth daily as needed for fluid or edema. 11/01/21 11/01/22  Yes Lorella Nimrod, MD  gabapentin (NEURONTIN) 300 MG capsule Take 2 capsules (600 mg total) by mouth at bedtime. Patient taking differently: Take 300 mg by mouth 3 (three) times daily. Take up to 3 times daily. 07/14/21  Yes Swayze, Ava, DO  HYDROcodone-acetaminophen (NORCO) 10-325 MG tablet Take 1 tablet by mouth every 6 (six) hours as needed. 6-8 hours as needed 10/29/21  Yes [provider]  levothyroxine (SYNTHROID) 100 MCG tablet Take 100 mcg by mouth daily. 12/19/21  Yes [provider]  metoprolol tartrate (LOPRESSOR) 25 MG tablet Take 1 tablet (25 mg total) by mouth 2 (two) times daily. 07/14/21  Yes Swayze, Ava, DO  pravastatin (PRAVACHOL) 20 MG tablet Take 20 mg by mouth at bedtime.    Yes [provider]  rOPINIRole (REQUIP) 0.25 MG tablet Take 0.25 mg by mouth at bedtime. 12/20/21  Yes [provider]  albuterol (VENTOLIN HFA) 108 (90 Base) MCG/ACT inhaler Inhale 2 puffs into the lungs every 6 (six) hours as needed for wheezing or shortness of breath. 08/15/21   Pokhrel, Corrie Mckusick, MD  amLODipine (NORVASC) 10 MG tablet Take 10 mg by mouth daily with breakfast. for high blood pressure 01/12/15   [provider]  COLACE 2-IN-1 8.6-50 MG tablet Take 2 tablets by mouth at bedtime. 08/06/21   [provider]  docusate sodium (COLACE) 100 MG capsule Take 1 capsule (100 mg total) by mouth 2 (two) times daily as needed for mild constipation. 05/24/21   Spongberg, Audie Pinto, MD  feeding supplement (ENSURE ENLIVE / ENSURE PLUS) LIQD Take 237 mLs by mouth 3 (three) times daily between meals. 07/14/21   Swayze, Ava, DO  Multiple Vitamin (MULTIVITAMIN WITH MINERALS) TABS tablet Take 1 tablet by mouth daily. Patient not taking: Reported on 08/12/2021 07/15/21   Swayze, Ava, DO  TYLENOL 325 MG tablet Take 650 mg by mouth 3 (three) times daily. 08/06/21   [provider]    Physical Exam: Vitals:   12/21/21 0130 12/21/21 0200 12/21/21 0230 12/21/21 0300   BP: (!) 168/93 (!) 167/86  (!) 177/86  Pulse: (!) 106 (!) 103 (!) 107 (!) 109  Resp: '20 18 19 20  '$ Temp:      TempSrc:      SpO2: 93% 98% 100% 99%  Weight:      Height:       Physical Exam Vitals and nursing note reviewed.  Constitutional:      General: She is not in acute distress. HENT:     Head: Normocephalic and atraumatic.  Cardiovascular:     Rate and Rhythm: Regular rhythm. Tachycardia present.     Heart sounds: Normal heart sounds.  Pulmonary:     Effort: Pulmonary effort is normal.     Breath sounds: Normal breath sounds.  Abdominal:     Palpations: Abdomen is soft.     Tenderness: There is no abdominal tenderness.  Musculoskeletal:     Comments: Swelling right thumb  Neurological:  General: No focal deficit present.     Mental Status: She is disoriented.     Labs on Admission: I have personally reviewed following labs and imaging studies  CBC: Recent Labs  Lab 12/20/21 2315  WBC 12.6*  NEUTROABS 10.1*  HGB 11.7*  HCT 35.0*  MCV 91.6  PLT 295   Basic Metabolic Panel: Recent Labs  Lab 12/20/21 2315  NA 135  K 3.7  CL 101  CO2 26  GLUCOSE 139*  BUN 32*  CREATININE 1.62*  CALCIUM 8.7*  MG 1.4*   GFR: Estimated Creatinine Clearance: 18 mL/min (A) (by C-G formula based on SCr of 1.62 mg/dL (H)). Liver Function Tests: Recent Labs  Lab 12/20/21 2315  AST 26  ALT 13  ALKPHOS 88  BILITOT 1.3*  PROT 7.7  ALBUMIN 3.9   No results for input(s): "LIPASE", "AMYLASE" in the last 168 hours. No results for input(s): "AMMONIA" in the last 168 hours. Coagulation Profile: No results for input(s): "INR", "PROTIME" in the last 168 hours. Cardiac Enzymes: No results for input(s): "CKTOTAL", "CKMB", "CKMBINDEX", "TROPONINI" in the last 168 hours. BNP (last 3 results) No results for input(s): "PROBNP" in the last 8760 hours. HbA1C: No results for input(s): "HGBA1C" in the last 72 hours. CBG: No results for input(s): "GLUCAP" in the last 168  hours. Lipid Profile: No results for input(s): "CHOL", "HDL", "LDLCALC", "TRIG", "CHOLHDL", "LDLDIRECT" in the last 72 hours. Thyroid Function Tests: No results for input(s): "TSH", "T4TOTAL", "FREET4", "T3FREE", "THYROIDAB" in the last 72 hours. Anemia Panel: No results for input(s): "VITAMINB12", "FOLATE", "FERRITIN", "TIBC", "IRON", "RETICCTPCT" in the last 72 hours. Urine analysis:    Component Value Date/Time   COLORURINE STRAW (A) 12/20/2021 2315   APPEARANCEUR CLEAR (A) 12/20/2021 2315   APPEARANCEUR Clear 11/21/2012 2330   LABSPEC 1.010 12/20/2021 2315   LABSPEC 1.004 11/21/2012 2330   PHURINE 5.0 12/20/2021 2315   GLUCOSEU NEGATIVE 12/20/2021 2315   GLUCOSEU Negative 11/21/2012 2330   HGBUR SMALL (A) 12/20/2021 2315   BILIRUBINUR NEGATIVE 12/20/2021 2315   BILIRUBINUR Negative 11/21/2012 2330   KETONESUR NEGATIVE 12/20/2021 2315   PROTEINUR 30 (A) 12/20/2021 2315   NITRITE NEGATIVE 12/20/2021 2315   LEUKOCYTESUR NEGATIVE 12/20/2021 2315   LEUKOCYTESUR Negative 11/21/2012 2330    Radiological Exams on Admission: DG Knee 2 Views Right  Result Date: 12/21/2021 CLINICAL DATA:  Right knee pain, fall EXAM: RIGHT KNEE - 1-2 VIEW COMPARISON:  None Available. FINDINGS: Normal alignment. No acute fracture or dislocation. Joint spaces are not optimally profiled but appear preserved. No effusion. Soft tissues are unremarkable. IMPRESSION: 1. Negative. Electronically Signed   By: Fidela Salisbury M.D.   On: 12/21/2021 00:00   DG Lumbar Spine 2-3 Views  Result Date: 12/20/2021 CLINICAL DATA:  Fall, back pain EXAM: LUMBAR SPINE - 2-3 VIEW COMPARISON:  03/24/2020 FINDINGS: Compression deformity of L4 is again identified with 50-60% loss of height. Age indeterminate superior endplate fracture of L3 has developed. No retropulsion. Remaining vertebral body height is preserved. There is diffuse intervertebral disc space narrowing and endplate remodeling throughout the visualized thoracolumbar  spine, most severe at T11-L2 in keeping with changes of moderate to severe degenerative disc disease. Vascular calcifications are seen within the abdominal aorta. IMPRESSION: 1. Age indeterminate superior endplate fracture of L3. 2. Stable L4 compression deformity. 3. Moderate to severe degenerative disc disease. Electronically Signed   By: Fidela Salisbury M.D.   On: 12/20/2021 23:59   DG Hand Complete Right  Result Date: 12/20/2021  CLINICAL DATA:  Fall, right hand pain EXAM: RIGHT HAND - COMPLETE 3+ VIEW COMPARISON:  None Available. FINDINGS: There is an acute, comminuted intra-articular fracture of the base of the right first proximal phalanx with a 1 mm articular gap noted. Extensive soft tissue swelling of the right thumb. No other fracture or dislocation identified. Osseous structures are diffusely osteopenic. Degenerative changes are noted at the base of the thumb and second distal interphalangeal joint. IMPRESSION: 1. Acute, comminuted intra-articular fracture of the base of the right first proximal phalanx. Electronically Signed   By: Fidela Salisbury M.D.   On: 12/20/2021 23:53   DG Chest 1 View  Result Date: 12/20/2021 CLINICAL DATA:  Fall, chest pain EXAM: CHEST  1 VIEW COMPARISON:  10/31/2021 FINDINGS: Large hiatal hernia again identified containing the transverse colon. Lungs are clear. No pneumothorax or pleural effusion. Cardiac size within normal limits. Pulmonary vascularity is normal. Healed right rib fracture noted. No acute bone abnormality. IMPRESSION: 1. No active disease. 2. Large hiatal hernia. Electronically Signed   By: Fidela Salisbury M.D.   On: 12/20/2021 23:36     Data Reviewed: Relevant notes from primary care and specialist visits, past discharge summaries as available in EHR, including Care Everywhere. Prior diagnostic testing as pertinent to current admission diagnoses Updated medications and problem lists for reconciliation ED course, including vitals, labs, imaging,  treatment and response to treatment Triage notes, nursing and pharmacy notes and ED provider's notes Notable results as noted in HPI   Assessment and Plan: * Acute metabolic encephalopathy Uncertain cause of fall Patient had a similar presentation in September and had a negative stroke work-up Patient has no focal deficits Neurologic checks Fall precautions PT eval  Fall at home, initial encounter Superior endplate fracture of L3 and stable L4 compression deformity. Neurosurgery was consulted from the ED: No TLSO or additional imaging recommended at this time Neurosurgery consult to follow Pain control Fall precautions   Sinus tachycardia Improving with IV fluid bolus administered in the ED Continue to monitor on telemetry  Hypomagnesemia IV magnesium administered in the ED Continue to monitor  Closed fracture of right thumb Received splinted in the ED  Acute renal failure superimposed on stage 3b chronic kidney disease ([place]) IV hydration and monitor        DVT prophylaxis: Lovenox  Consults: Neurosurgery, Dr. Tamala Julian  Advance Care Planning:   Code Status: Prior   Family Communication: none  Disposition Plan: Back to previous home environment  Severity of Illness: The appropriate patient status for this patient is INPATIENT. Inpatient status is judged to be reasonable and necessary in order to provide the required intensity of service to ensure the patient's safety. The patient's presenting symptoms, physical exam findings, and initial radiographic and laboratory data in the context of their chronic comorbidities is felt to place them at high risk for further clinical deterioration. Furthermore, it is not anticipated that the patient will be medically stable for discharge from the hospital within 2 midnights of admission.   * I certify that at the point of admission it is my clinical judgment that the patient will require inpatient hospital care spanning beyond 2  midnights from the point of admission due to high intensity of service, high risk for further deterioration and high frequency of surveillance required.*  Author: Athena Masse, MD 12/21/2021 3:33 AM  For on call review [email or web address].

## 2021-12-21 NOTE — Plan of Care (Signed)
Received a newly admitted patient from the ED, case of Acute Metabolic Encephalopathy, status post fall. Short splint to right arm intact. Patient confused with some agitation observed. Telemetry notified RN of non-sustained SVTs, notified NP on call. Patient remains tachycardic, on 110 to 115. Provider aware. Routine admission care provided. Orientation to her room done. High fall risk, bed to lowest level, bed alarm on, non-skid socks on, floor mat on. Needs attended. Password created "[email or web address]" per conversation with daughter.  Problem: Education: Goal: Knowledge of General Education information will improve Description: Including pain rating scale, medication(s)/side effects and non-pharmacologic comfort measures Outcome: Progressing   Problem: Health Behavior/Discharge Planning: Goal: Ability to manage health-related needs will improve Outcome: Progressing   Problem: Clinical Measurements: Goal: Ability to maintain clinical measurements within normal limits will improve Outcome: Progressing Goal: Will remain free from infection Outcome: Progressing Goal: Diagnostic test results will improve Outcome: Progressing Goal: Respiratory complications will improve Outcome: Progressing Goal: Cardiovascular complication will be avoided Outcome: Progressing   Problem: Activity: Goal: Risk for activity intolerance will decrease Outcome: Progressing   Problem: Nutrition: Goal: Adequate nutrition will be maintained Outcome: Progressing   Problem: Coping: Goal: Level of anxiety will decrease Outcome: Progressing   Problem: Elimination: Goal: Will not experience complications related to bowel motility Outcome: Progressing Goal: Will not experience complications related to urinary retention Outcome: Progressing   Problem: Pain Managment: Goal: General experience of comfort will improve Outcome: Progressing   Problem: Safety: Goal: Ability to remain free from injury will improve Outcome:  Progressing   Problem: Skin Integrity: Goal: Risk for impaired skin integrity will decrease Outcome: Progressing

## 2021-12-21 NOTE — Progress Notes (Signed)
MD Reesa Chew made aware of patient's heart rate-ST in 180's. PRN metoprolol given. Patient asymptomatic. Will continue to monitor.

## 2021-12-21 NOTE — Consult Note (Signed)
Consulting Department:  ED  Primary Physician:  [facility] [place]  Chief Complaint:  Lumbar Compression Fracture  History of Present Illness: 12/21/2021 Theresa Snyder is a 88 y.o. female who presents with the chief complaint of a lumbar compression fracture.  She is admitted for leukocytosis, urinary frequency, urgency, dysuria, and possible urinary tract infection with some confusion.  Our consultation was for lumbar compression fracture of the superior endplate of L3.  Her most recent trauma was a fall a few weeks ago when she was going to church.  She states that she fell forward landing on her hands and breaking her thumb.  She has been splinting it herself.  In regards to her low back, she has had at least 10+ years of low back pain and has had a previous severe lumbar compression fracture for which she was told she could wear a brace.  Over the past 10+ years she does intermittently wear her brace as it does help her back feel better.  She has also had a history of sciatica for approximately 10 years as well.  She has not had any procedures for it.  She does still feel the sensation going down her right leg intermittently.  This is no new changes.  She has not had any bowel or bladder changes.  Not had any new sensation changes to her lower extremity.  Review of Systems:  A 10 point review of systems is negative, except for the pertinent positives and negatives detailed in the HPI.  Past Medical History: Past Medical History:  Diagnosis Date   Arthritis    Cancer ([place])    skin   Chronic kidney disease    history of renal insufficiency   History of hiatal hernia    Hypercholesteremia    Hypertension    Hypothyroidism    Lymphedema of leg    Pneumonia    in the past   Seizures ([place]) 2014   no seizures since then   Spinal stenosis     Past Surgical History: Past Surgical History:  Procedure Laterality Date   APPENDECTOMY     CATARACT EXTRACTION  W/PHACO Left 09/25/2015   Procedure: CATARACT EXTRACTION PHACO AND INTRAOCULAR LENS PLACEMENT ([place]);  Surgeon: Estill Cotta, MD;  Location: [facility] ORS;  Service: Ophthalmology;  Laterality: Left;  [place] 01:23 AP% 24.0 CDE 34.98 Fluid pack lot # [id number]   CATARACT EXTRACTION W/PHACO Right 07/01/2016   Procedure: CATARACT EXTRACTION PHACO AND INTRAOCULAR LENS PLACEMENT ([facility]);  Surgeon: Estill Cotta, MD;  Location: [facility] ORS;  Service: Ophthalmology;  Laterality: Right;  [place] 1:25.9 AP% 24.8 CDE 40.61 Fluid Pack Lot # [id number]   HIP ARTHROPLASTY Left 07/10/2021   Procedure: ARTHROPLASTY BIPOLAR HIP (HEMIARTHROPLASTY);  Surgeon: Renee Harder, MD;  Location: [facility] ORS;  Service: Orthopedics;  Laterality: Left;   TUBAL LIGATION      Allergies: Allergies as of 12/20/2021 - Review Complete 12/20/2021  Allergen Reaction Noted   Iodinated contrast media Other (See Comments) 03/04/2015   Lorazepam  03/04/2015   Risperidone Other (See Comments) 03/04/2015   Sulfamethoxazole-trimethoprim  05/28/2015    Medications:  Current Facility-Administered Medications:    0.9 %  sodium chloride infusion, , Intravenous, Continuous, Judd Gaudier V, MD, Last Rate: 100 mL/hr at 12/21/21 0530, New Bag at 12/21/21 0530   acetaminophen (TYLENOL) tablet 650 mg, 650 mg, Oral, Q6H PRN **OR** acetaminophen (TYLENOL) suppository 650 mg, 650 mg, Rectal, Q6H PRN, Athena Masse, MD   albuterol (PROVENTIL) (2.5  MG/3ML) 0.083% nebulizer solution 2.5 mg, 2.5 mg, Inhalation, Q6H PRN, Athena Masse, MD   amLODipine (NORVASC) tablet 10 mg, 10 mg, Oral, Q breakfast, Judd Gaudier V, MD   benazepril (LOTENSIN) tablet 10 mg, 10 mg, Oral, Daily, Judd Gaudier V, MD   enoxaparin (LOVENOX) injection 30 mg, 30 mg, Subcutaneous, Q24H, Athena Masse, MD   HYDROcodone-acetaminophen (NORCO/VICODIN) 5-325 MG per tablet 1-2 tablet, 1-2 tablet, Oral, Q4H PRN, Athena Masse, MD, 2 tablet at 12/21/21 0530   levothyroxine  (SYNTHROID) tablet 100 mcg, 100 mcg, Oral, Q0600, Athena Masse, MD, 100 mcg at 12/21/21 0530   metoprolol tartrate (LOPRESSOR) tablet 25 mg, 25 mg, Oral, BID, Athena Masse, MD   ondansetron [facility]) tablet 4 mg, 4 mg, Oral, Q6H PRN **OR** ondansetron (ZOFRAN) injection 4 mg, 4 mg, Intravenous, Q6H PRN, Athena Masse, MD   pravastatin (PRAVACHOL) tablet 20 mg, 20 mg, Oral, QHS, Athena Masse, MD   Social History: Social History   Tobacco Use   Smoking status: Never   Smokeless tobacco: Never  Vaping Use   Vaping Use: Never used  Substance Use Topics   Alcohol use: No    Alcohol/week: 0.0 standard drinks of alcohol   Drug use: No   Lives with Daughter   Family Medical History: History reviewed. No pertinent family history.  Physical Examination: Vitals:   12/21/21 0300 12/21/21 0357  BP: (!) 177/86 (!) 173/75  Pulse: (!) 109 100  Resp: 20 18  Temp:  98.3 F (36.8 C)  SpO2: 99% 96%     General: Patient is well developed, well nourished, calm, collected, and in no apparent distress.  She does have some tangential speech and becomes easily misdirected.  NEUROLOGICAL:  General: In no acute distress.  Tangential speech.  Awake, alert, oriented to person, [facility], and "2024" but corrects.  Pupils equal round and reactive to light.  Facial tone is symmetric.  Tongue protrusion is midline.  There is no pronator drift.  Strength: Her bilateral lower extremities demonstrate at least antigravity strength in the bilateral lower extremities.  She is able to lift her legs off of both sides of the bed.  She is able to dorsiflex her ankle and plantarflex her ankle.  She does have some difficulty doing  confrontational testing but mostly from a interpretive standpoint.  She does not have any obvious gross motor deficits.  I lateral lower extremity reflexes are trace.  She does have sensation in the bilateral lower extremities.  Clonus is not present.  Toes are down-going.      Imaging:   Narrative & Impression  CLINICAL DATA:  Fall, back pain   EXAM: LUMBAR SPINE - 2-3 VIEW   COMPARISON:  03/24/2020   FINDINGS: Compression deformity of L4 is again identified with 50-60% loss of height. Age indeterminate superior endplate fracture of L3 has developed. No retropulsion. Remaining vertebral body height is preserved. There is diffuse intervertebral disc space narrowing and endplate remodeling throughout the visualized thoracolumbar spine, most severe at T11-L2 in keeping with changes of moderate to severe degenerative disc disease. Vascular calcifications are seen within the abdominal aorta.   IMPRESSION: 1. Age indeterminate superior endplate fracture of L3. 2. Stable L4 compression deformity. 3. Moderate to severe degenerative disc disease.     Electronically Signed   By: Fidela Salisbury M.D.   On: 12/20/2021 23:59    I have personally reviewed the images and agree with the above interpretation.  Labs:  Latest Ref Rng & Units 12/21/2021    5:30 AM 12/20/2021   11:15 PM 11/01/2021    6:26 AM  CBC  WBC 4.0 - 10.5 K/uL 11.4  12.6  8.7   Hemoglobin 12.0 - 15.0 g/dL 11.4  11.7  11.1   Hematocrit 36.0 - 46.0 % 34.3  35.0  33.5   Platelets 150 - 400 K/uL 246  244  265        Assessment and Plan: Ms. Abid is a pleasant 88 y.o. female with an admission for some confusion, leukocytosis, urinary frequency and dysuria.  She had had a history of a remote fall with the trauma to her right hand and thumb.  As part of her trauma work-up had x-rays of her low back which demonstrated a age-indeterminate determinant L3 lumbar fracture of the superior endplate.  No evidence of worsening of her previously noted severe L4 compression fracture.  On physical exam today she has no gross deficits in her bilateral lower extremities.  She does not have midline pain to palpation.  She does have a 10+ year history of back pain and wears a brace intermittently  at home for comfort.  She with her previous compression fracture at L4 she was told that she could wear her brace for comfort, and she would not want any procedures to help with her current back pain.  She is not showing any signs of compression.  The endplate seems to be the only area of fracture, no retropulsion.  Because of this we do not need any further imaging.  I have discussed the condition with the patient, including showing the radiographs and discussing treatment options in layman's terms.  The patient may benefit from conservative management.      Stevan Born, MD/MSCR Dept. of Neurosurgery

## 2021-12-21 NOTE — Assessment & Plan Note (Signed)
Received splinted in the ED

## 2021-12-21 NOTE — ED Provider Notes (Signed)
[facility] Provider Note    Event Date/Time   First MD Initiated Contact with Patient 12/20/21 2303     (approximate)   History   Fall   HPI Level 5 caveat:  history/ROS limited by altered mental status/confusion   Theresa Snyder is a 88 y.o. female who presents for evaluation of altered mental status and a fall.  Her family reported to EMS that she was "talking out of her head" all day today.  She has done similar things in the past when she had a urinary tract infection.  She then had a fall, landing on the right side of her body including her knee and her right hand.  She is reporting pain in her right thumb and has an abrasion to her right knee but she said it feels okay otherwise.  She is also having low back pain.  There is at least a degree of chronic pain in her lower back, but she said it also hurts worse than usual.  She cannot provide any substantial history or details.  She seems to be confabulating when asked about the specifics of the story because they do not correlate with the story from the paramedics.  She is reporting no headache or neck pain and she denies chest pain, shortness of breath, nausea, vomiting, and abdominal pain.  She says that she does not think she has had any pain when she urinates recently.     Physical Exam   Triage Vital Signs: ED Triage Vitals  Enc Vitals Group     BP 12/20/21 2303 (!) 118/103     Pulse Rate 12/20/21 2303 (!) 106     Resp 12/20/21 2303 16     Temp 12/20/21 2303 98.2 F (36.8 C)     Temp Source 12/20/21 2303 Oral     SpO2 12/20/21 2303 98 %     Weight 12/20/21 2312 55.3 kg (122 lb)     Height 12/20/21 2312 1.524 m (5')     Head Circumference --      Peak Flow --      Pain Score 12/20/21 2312 6     Pain Loc --      Pain Edu? --      Excl. in [place]? --     Most recent vital signs: Vitals:   12/21/21 0300 12/21/21 0357  BP: (!) 177/86 (!) 173/75  Pulse: (!) 109 100  Resp: 20 18  Temp:  98.3  F (36.8 C)  SpO2: 99% 96%     General: Awake, alert and oriented to self and location but clearly is a poor historian and is confabulating details of her day today. CV:  Good peripheral perfusion.  Tachycardia with regular rhythm. Resp:  Normal effort.  Lungs are clear to auscultation. Abd:  No distention.  No tenderness to palpation. Other:  She has swelling and ecchymosis of the right thumb consistent with a fracture seen on x-ray.  She has a small superficial abrasion to her right knee.  No other obvious abnormalities on physical exam and she is moving all 4 extremities without difficulty.   ED Results / Procedures / Treatments   Labs (all labs ordered are listed, but only abnormal results are displayed) Labs Reviewed  MAGNESIUM - Abnormal; Notable for the following components:      Result Value   Magnesium 1.4 (*)    All other components within normal limits  COMPREHENSIVE METABOLIC PANEL - Abnormal; Notable for the  following components:   Glucose, Bld 139 (*)    BUN 32 (*)    Creatinine, Ser 1.62 (*)    Calcium 8.7 (*)    Total Bilirubin 1.3 (*)    GFR, Estimated 30 (*)    All other components within normal limits  CBC WITH DIFFERENTIAL/PLATELET - Abnormal; Notable for the following components:   WBC 12.6 (*)    RBC 3.82 (*)    Hemoglobin 11.7 (*)    HCT 35.0 (*)    Neutro Abs 10.1 (*)    Monocytes Absolute 1.2 (*)    All other components within normal limits  URINALYSIS, COMPLETE (UACMP) WITH MICROSCOPIC - Abnormal; Notable for the following components:   Color, Urine STRAW (*)    APPearance CLEAR (*)    Hgb urine dipstick SMALL (*)    Protein, ur 30 (*)    All other components within normal limits  CULTURE, BLOOD (SINGLE)  URINE CULTURE  LACTIC ACID, PLASMA  CBC  CREATININE, SERUM     EKG  ED ECG REPORT I, Hinda Kehr, the attending physician, personally viewed and interpreted this ECG.  Date: 12/21/2021 EKG Time: 00:25 Rate: 94 Rhythm: sinus  rhythm QRS Axis: normal Intervals: normal ST/T Wave abnormalities: Non-specific ST segment / T-wave changes, but no clear evidence of acute ischemia. Narrative Interpretation: no definitive evidence of acute ischemia; does not meet STEMI criteria.    RADIOLOGY I viewed and interpreted the patient's chest x-ray, hand x-ray, lumbar spine x-rays, and knee x-rays.  I could identify a proximal phalanx fracture of the right thumb, otherwise unremarkable.  There is also a deformity to the L3 and L4 vertebra on the lumbar spine x-rays.  The radiologist commented that the L4 is stable from prior but the L3 is "age indeterminate".  No evidence of fracture on knee x-rays.  Large chronic hiatal hernia on chest x-ray, but no acute findings.    PROCEDURES:  Critical Care performed: No  .1-3 Lead EKG Interpretation  Performed by: Hinda Kehr, MD Authorized by: Hinda Kehr, MD     Interpretation: abnormal     ECG rate:  120   ECG rate assessment: tachycardic     Ectopy: none     Conduction: normal   .Ortho Injury Treatment  Date/Time: 12/21/2021 2:00 AM  Performed by: Hinda Kehr, MD Authorized by: Hinda Kehr, MD   Consent:    Consent obtained:  Verbal and emergent situation   Consent given by:  Patient   Risks discussed:  Fracture   Alternatives discussed:  No treatmentInjury location: finger Location details: right thumb Injury type: fracture Fracture type: proximal phalanx MCP joint involved: no Any IP joint involved: yes Pre-procedure neurovascular assessment: neurovascularly intact Pre-procedure distal perfusion: normal Pre-procedure neurological function: normal Pre-procedure range of motion: reduced  Anesthesia: Local anesthesia used: no  Patient sedated: NoManipulation performed: no Immobilization: splint Splint type: thumb spica Splint Applied by: ED Tech Supplies used: Ortho-Glass Post-procedure neurovascular assessment: post-procedure neurovascularly  intact Post-procedure distal perfusion: normal Post-procedure neurological function: normal Post-procedure range of motion: unchanged      MEDICATIONS ORDERED IN ED: Medications  amLODipine (NORVASC) tablet 10 mg (has no administration in time range)  benazepril (LOTENSIN) tablet 10 mg (has no administration in time range)  metoprolol tartrate (LOPRESSOR) tablet 25 mg (has no administration in time range)  pravastatin (PRAVACHOL) tablet 20 mg (has no administration in time range)  levothyroxine (SYNTHROID) tablet 100 mcg (has no administration in time range)  albuterol (PROVENTIL) (2.5 MG/3ML)  0.083% nebulizer solution 2.5 mg (has no administration in time range)  enoxaparin (LOVENOX) injection 30 mg (has no administration in time range)  acetaminophen (TYLENOL) tablet 650 mg (has no administration in time range)    Or  acetaminophen (TYLENOL) suppository 650 mg (has no administration in time range)  ondansetron (ZOFRAN) tablet 4 mg (has no administration in time range)    Or  ondansetron (ZOFRAN) injection 4 mg (has no administration in time range)  0.9 %  sodium chloride infusion (has no administration in time range)  HYDROcodone-acetaminophen (NORCO/VICODIN) 5-325 MG per tablet 1-2 tablet (has no administration in time range)  magnesium sulfate IVPB 2 g 50 mL (0 g Intravenous Stopped 12/21/21 0306)  lactated ringers bolus 1,000 mL (1,000 mLs Intravenous New Bag/Given 12/21/21 0156)     IMPRESSION / MDM / ASSESSMENT AND PLAN / ED COURSE  I reviewed the triage vital signs and the nursing notes.                              Differential diagnosis includes, but is not limited to, nonspecific metabolic encephalopathy, electrolyte or metabolic abnormality, acute infectious process, sepsis, UTI, pneumonia, fracture/dislocation, acute back injury including fracture or spinal cord injury.  Patient's presentation is most consistent with acute presentation with potential threat to life  or bodily function.  Labs/studies ordered: EKG, cardiac monitoring, x-rays of the chest, hand, lumbar spine, and knee, CBC with differential, CMP, lactic acid, magnesium, urinalysis, blood culture.  Vital signs are notable for some tachycardia and hypertension.  The tachycardia actually got worse over the course of her stay in the ED and was around 120 at the time I decided to admit her.  She is clearly confabulating and not of normal mental status at this time.  I reviewed her medical record including a prior hospitalization when she had metabolic encephalopathy thought to be due to her urinary tract infection.  However, her UA is reassuring tonight.  Labs notable for some hypomagnesemia of 1.4 for which I ordered magnesium 2 g IV.  Lactic acid is within normal limits.  She has a mild leukocytosis of 12.6 but this could be a reaction to the overall stress on her body.  She has a mild AKI with a creatinine of 1.62 but this is not significantly different from her prior.  I do not think that she is suffering from sepsis due to a urinary tract infection or other obvious source.  However I do not know for certain why she is tachycardic at this time.  She also has an age-indeterminate fracture of L3 which could be acute in the setting of her recent fall, and she is reporting more pain than usual.  I will consult with neurosurgery to discuss the spinal issues but given the tachycardia and altered mental status, I believe she will need to be admitted to the hospital service for further management.  The patient is on the cardiac monitor to evaluate for evidence of arrhythmia and/or significant heart rate changes.   Clinical Course as of 12/21/21 1540  Nancy Fetter Dec 21, 2021  0153 Consulted by phone with Dr. Tamala Julian with neurosurgery.  He said that based on the description he would not recommend any additional evaluation or imaging tonight.  I consulted in person with Dr. Damita Dunnings with the hospitalist service who will  admit the patient for tachycardia and encephalopathy as well as for the patient to see neurosurgery in the morning.  We discussed the plan of care and she agrees with the plan.   [CF]    Clinical Course User Index [CF] Hinda Kehr, MD     FINAL CLINICAL IMPRESSION(S) / ED DIAGNOSES   Final diagnoses:  Tachycardia  Acute encephalopathy  Fall, initial encounter  Displaced fracture of proximal phalanx of right thumb, initial encounter for closed fracture  Abrasion of right knee, initial encounter  Midline low back pain with bilateral sciatica, unspecified chronicity  Closed fracture of third lumbar vertebra, unspecified fracture morphology, initial encounter ([place])  Closed fracture of fourth lumbar vertebra, unspecified fracture morphology, initial encounter ([place])     Rx / DC Orders   ED Discharge Orders     None        Note:  This document was prepared using Dragon voice recognition software and may include unintentional dictation errors.   Hinda Kehr, MD 12/21/21 [phone]

## 2021-12-21 NOTE — Assessment & Plan Note (Signed)
Improving with IV fluid bolus administered in the ED Continue to monitor on telemetry

## 2021-12-21 NOTE — Progress Notes (Signed)
Anticoagulation monitoring(Lovenox):  88 yo female ordered Lovenox 40 mg Q24h    Filed Weights   12/20/21 2312 12/21/21 0357  Weight: 55.3 kg (122 lb) 57 kg (125 lb 10.6 oz)   BMI 24.5   Lab Results  Component Value Date   CREATININE 1.62 (H) 12/20/2021   CREATININE 1.49 (H) 11/01/2021   CREATININE 1.76 (H) 10/31/2021   Estimated Creatinine Clearance: 18.3 mL/min (A) (by C-G formula based on SCr of 1.62 mg/dL (H)). Hemoglobin & Hematocrit     Component Value Date/Time   HGB 11.7 (L) 12/20/2021 2315   HGB 10.6 (L) 11/21/2012 2330   HCT 35.0 (L) 12/20/2021 2315   HCT 30.6 (L) 11/21/2012 2330     Per Protocol for Patient with estCrcl < 30 ml/min and BMI < 40, will transition to Lovenox 30 mg Q24h.

## 2021-12-21 NOTE — Hospital Course (Signed)
HTN, hypothyroidism, CKD 3B, diastolic CHF, hospitalized in September 2023 with acute encephalopathy attributed to dehydration and possible TIA (MRI negative for CVA) Who was brought to the ED from home following a witnessed fall.  Family who is no longer at bedside, reported that patient had been "talking out of her head all day".  Patient sustained an injury to her right thumb and low back.  On evaluation, patient appears very animated and talkative and is oriented to person and place. ED course and data review: BP 118/103.  Persistently tachycardic 10 3-1 10 with otherwise normal vitals.  Labs significant for WBC of 12,600 with normal lactic acid of 1.7.  Creatinine is at 1.62 slightly above her baseline of 1.49.  Magnesium 1.4, urinalysis sterile.  Hemoglobin at baseline at 11.7.  EKG, personally viewed and interpreted shows sinus at 94 with no acute ST-T wave changes. Trauma imaging significant for fracture right thumb.  Lumbar x-rays showing age-indeterminate superior endplate fracture of L3 and stable L4 compression deformity. The ED provider spoke with neurosurgeon, Dr. Tamala Julian who did not recommend any additional imaging at this time but will see patient in the a.m.  Patient's thumb was splinted in the ED, magnesium supplemented and she was given a fluid bolus.  Hospitalist consulted for admission due to concern for altered mental status and persistent tachycardia.

## 2021-12-21 NOTE — Assessment & Plan Note (Addendum)
Uncertain cause of fall Patient had a similar presentation in September and had a negative stroke work-up Patient has no focal deficits Neurologic checks Fall precautions PT eval

## 2021-12-21 NOTE — Assessment & Plan Note (Signed)
IV magnesium administered in the ED Continue to monitor

## 2021-12-22 DIAGNOSIS — G9341 Metabolic encephalopathy: Secondary | ICD-10-CM | POA: Diagnosis not present

## 2021-12-22 LAB — BASIC METABOLIC PANEL
Anion gap: 6 (ref 5–15)
BUN: 36 mg/dL — ABNORMAL HIGH (ref 8–23)
CO2: 25 mmol/L (ref 22–32)
Calcium: 8.4 mg/dL — ABNORMAL LOW (ref 8.9–10.3)
Chloride: 106 mmol/L (ref 98–111)
Creatinine, Ser: 1.58 mg/dL — ABNORMAL HIGH (ref 0.44–1.00)
GFR, Estimated: 31 mL/min — ABNORMAL LOW (ref >60)
Glucose, Bld: 105 mg/dL — ABNORMAL HIGH (ref 70–99)
Potassium: 4.2 mmol/L (ref 3.5–5.1)
Sodium: 137 mmol/L (ref 135–145)

## 2021-12-22 LAB — URINE CULTURE

## 2021-12-22 LAB — CBC
HCT: 29.6 % — ABNORMAL LOW (ref 36.0–46.0)
Hemoglobin: 10 g/dL — ABNORMAL LOW (ref 12.0–15.0)
MCH: 31 pg (ref 26.0–34.0)
MCHC: 33.8 g/dL (ref 30.0–36.0)
MCV: 91.6 fL (ref 80.0–100.0)
Platelets: 231 10*3/uL (ref 150–400)
RBC: 3.23 MIL/uL — ABNORMAL LOW (ref 3.87–5.11)
RDW: 13.1 % (ref 11.5–15.5)
WBC: 9.1 10*3/uL (ref 4.0–10.5)
nRBC: 0 % (ref 0.0–0.2)

## 2021-12-22 LAB — GLUCOSE, CAPILLARY: Glucose-Capillary: 90 mg/dL (ref 70–99)

## 2021-12-22 LAB — FOLATE: Folate: 12.5 ng/mL (ref >5.9)

## 2021-12-22 LAB — MAGNESIUM: Magnesium: 1.8 mg/dL (ref 1.7–2.4)

## 2021-12-22 MED ORDER — VITAMIN B-12 1000 MCG PO TABS
1000.0000 ug | ORAL_TABLET | Freq: Every day | ORAL | Status: DC
  Administered 2021-12-22 – 2021-12-24 (×3): 1000 ug via ORAL
  Filled 2021-12-22 (×3): qty 1

## 2021-12-22 MED ORDER — METOPROLOL TARTRATE 25 MG PO TABS
25.0000 mg | ORAL_TABLET | Freq: Two times a day (BID) | ORAL | Status: DC
  Administered 2021-12-22 – 2021-12-23 (×4): 25 mg via ORAL
  Filled 2021-12-22 (×4): qty 1

## 2021-12-22 NOTE — Plan of Care (Signed)
  Problem: Clinical Measurements: Goal: Ability to maintain clinical measurements within normal limits will improve Outcome: Progressing   Problem: Clinical Measurements: Goal: Will remain free from infection Outcome: Progressing   Problem: Nutrition: Goal: Adequate nutrition will be maintained Outcome: Progressing   

## 2021-12-22 NOTE — NC FL2 (Signed)
[place] LEVEL OF CARE SCREENING TOOL     IDENTIFICATION  Patient Name: Theresa Snyder Birthdate: 06-01-1929 Sex: female Admission Date (Current Location): 12/20/2021  [facility] and [place] Number:  Engineering geologist and Address:  [facility], [address] [place], [place] [address]      Provider Number: [id number]  Attending Physician Name and Address:  Damita Lack, MD  Relative Name and Phone Number:  [name] [phone]    Current Level of Care: Hospital Recommended Level of Care: [place] Prior Approval Number:    Date Approved/Denied:   PASRR Number: [id number]  Discharge Plan: SNF    Current Diagnoses: Patient Active Problem List   Diagnosis Date Noted   Closed fracture of right thumb 12/21/2021   Hypomagnesemia 12/21/2021   Sinus tachycardia 12/21/2021   Atrial fibrillation with RVR ([place]) 17/40/8144   Acute metabolic encephalopathy 81/85/6314   UTI (urinary tract infection) 10/31/2021   Dehydration 10/31/2021   Chronic diastolic CHF (congestive heart failure) ([place]) 10/31/2021   Multifocal pneumonia 08/13/2021   Acute on chronic diastolic CHF (congestive heart failure) ([place]) 08/13/2021   Neuropathic pain 08/13/2021   Leukocytosis 08/13/2021   Pneumonia 08/13/2021   Acute dyspnea 08/12/2021   Lymphedema of leg    Hip fracture ([place]) 07/09/2021   Colonic obstruction ([place]) 05/21/2021   Large hiatal hernia 05/21/2021   Severe sepsis with lactic acidosis ([place]) 05/21/2021   Lumbar compression fracture ([place]) 03/24/2020   Fall at home, initial encounter 03/23/2020   L4 vertebral fracture ([place]) 03/23/2020   Chronic kidney disease 03/04/2015   Adaptive colitis 03/04/2015   BP (high blood pressure) 05/18/2013   Hypothyroidism 05/18/2013   Hyperlipidemia 05/18/2013    Orientation RESPIRATION BLADDER Height & Weight     Self, Place  Normal Continent, External catheter Weight: 57 kg Height:   5' (152.4 cm)  BEHAVIORAL SYMPTOMS/MOOD NEUROLOGICAL BOWEL NUTRITION STATUS      Continent Diet (See DC summary)  AMBULATORY STATUS COMMUNICATION OF NEEDS Skin   Extensive Assist Verbally Normal (Back Brace)                       Personal Care Assistance Level of Assistance  Bathing, Feeding, Dressing Bathing Assistance: Maximum assistance Feeding assistance: Limited assistance Dressing Assistance: Maximum assistance     Functional Limitations Info             SPECIAL CARE FACTORS FREQUENCY  PT (By licensed PT), OT (By licensed OT)     PT Frequency: 5 times per week OT Frequency: 5 times per week            Contractures Contractures Info: Not present    Additional Factors Info  Code Status, Allergies Code Status Info: full code Allergies Info: Iodinated Contrast Media, Lorazepam, Risperidone, Sulfamethoxazole-trimethoprim           Current Medications (12/22/2021):  This is the current hospital active medication list Current Facility-Administered Medications  Medication Dose Route Frequency Provider Last Rate Last Admin   acetaminophen (TYLENOL) tablet 650 mg  650 mg Oral Q6H PRN Athena Masse, MD   650 mg at 12/22/21 0914   Or   acetaminophen (TYLENOL) suppository 650 mg  650 mg Rectal Q6H PRN Athena Masse, MD       amLODipine (NORVASC) tablet 10 mg  10 mg Oral Q breakfast Athena Masse, MD   10 mg at 12/22/21 0914   benazepril (LOTENSIN) tablet 10  mg  10 mg Oral Daily Athena Masse, MD   10 mg at 12/22/21 1610   cholecalciferol (VITAMIN D3) 25 MCG (1000 UNIT) tablet 1,000 Units  1,000 Units Oral Daily Damita Lack, MD   1,000 Units at 12/22/21 0914   cyanocobalamin (VITAMIN B12) tablet 1,000 mcg  1,000 mcg Oral Daily Amin, Ankit Chirag, MD   1,000 mcg at 12/22/21 0914   docusate sodium (COLACE) capsule 100 mg  100 mg Oral BID PRN Amin, Ankit Chirag, MD       enoxaparin (LOVENOX) injection 30 mg  30 mg Subcutaneous Q24H Judd Gaudier V, MD    30 mg at 12/22/21 0915   feeding supplement (ENSURE ENLIVE / ENSURE PLUS) liquid 237 mL  237 mL Oral TID BM Amin, Ankit Chirag, MD   237 mL at 12/22/21 0915   gabapentin (NEURONTIN) capsule 300 mg  300 mg Oral TID Amin, Ankit Chirag, MD   300 mg at 12/22/21 0914   guaiFENesin (ROBITUSSIN) 100 MG/5ML liquid 5 mL  5 mL Oral Q4H PRN Amin, Ankit Chirag, MD       hydrALAZINE (APRESOLINE) injection 10 mg  10 mg Intravenous Q4H PRN Amin, Ankit Chirag, MD       HYDROcodone-acetaminophen (NORCO/VICODIN) 5-325 MG per tablet 1-2 tablet  1-2 tablet Oral Q4H PRN Athena Masse, MD   2 tablet at 12/22/21 0600   ipratropium-albuterol (DUONEB) 0.5-2.5 (3) MG/3ML nebulizer solution 3 mL  3 mL Nebulization Q4H PRN Amin, Ankit Chirag, MD       levothyroxine (SYNTHROID) tablet 100 mcg  100 mcg Oral Q0600 Judd Gaudier V, MD   100 mcg at 12/22/21 0525   metoprolol tartrate (LOPRESSOR) injection 5 mg  5 mg Intravenous Q4H PRN Amin, Ankit Chirag, MD   5 mg at 12/21/21 1420   metoprolol tartrate (LOPRESSOR) tablet 25 mg  25 mg Oral BID Amin, Ankit Chirag, MD   25 mg at 12/22/21 0914   ondansetron (ZOFRAN) tablet 4 mg  4 mg Oral Q6H PRN Athena Masse, MD       Or   ondansetron [facility]) injection 4 mg  4 mg Intravenous Q6H PRN Athena Masse, MD       pravastatin (PRAVACHOL) tablet 20 mg  20 mg Oral QHS Judd Gaudier V, MD   20 mg at 12/21/21 2114   rOPINIRole (REQUIP) tablet 0.25 mg  0.25 mg Oral QHS Amin, Ankit Chirag, MD   0.25 mg at 12/21/21 2115   senna-docusate (Senokot-S) tablet 1 tablet  1 tablet Oral QHS PRN Amin, Ankit Chirag, MD       traZODone (DESYREL) tablet 50 mg  50 mg Oral QHS PRN Damita Lack, MD   50 mg at 12/22/21 0106     Discharge Medications: Please see discharge summary for a list of discharge medications.  Relevant Imaging Results:  Relevant Lab Results:   Additional Information [id number]  Conception Oms, RN

## 2021-12-22 NOTE — Progress Notes (Signed)
Readmission risk assessment completed

## 2021-12-22 NOTE — TOC Progression Note (Signed)
Transition of Care [facility]) - Progression Note    Patient Details  Name: Theresa Snyder MRN: [id number] Date of Birth: 10/25/1929  Transition of Care [facility]) CM/SW [place], RN Phone Number: 12/22/2021, 11:27 AM  Clinical Narrative:    The patient is open with [facility]        Expected Discharge Plan and Services                                                 Social Determinants of Health (SDOH) Interventions Housing Interventions: Patient Refused  Readmission Risk Interventions     No data to display

## 2021-12-22 NOTE — Evaluation (Addendum)
Occupational Therapy Evaluation Patient Details Name: Theresa Snyder MRN: [id number] DOB: Nov 03, 1929 Today's Date: 12/22/2021   History of Present Illness Pt is a 88 y.o. female presenting to hospital 12/20/21 s/p witnessed fall.  Imaging showing "acute, comminuted intra-articular fracture of the base of the right first proximal phalanx.  Age indeterminate superior endplate fracture of L3.  Stable L4 compression deformity."  Hospitalized Sept 2023 with acute encephalopathy attributed to dehydration and possible TIA.  Pt admitted with acute metabolic encephalopathy, fall at home, superior endplate fx of L3 and stable L4 compression deformity (no TLSO recommended by neurosurgery per notes; recommending conservative management), sinus tachycardia, hypomagnesemia, close fx R thumb, and acute renal failure.  PMH includes CKD, htn, lymphedema, syncope, seizures, spinal stenosis, large hiatal hernia, CHF.  Of note, pt s/p L hip hemiarthroplasty 07/10/21.   Clinical Impression   Patient seen for OT evaluation. Sitter present. Pt presenting with decreased independence in self care, balance, functional mobility/transfers, endurance, and safety awareness. Prior to admission, pt lived with daughter (disabled and unable to provide adequate care for pt), received assistance for ADLs/IADLs, and was Mod I for short household distances using a RW. Pt has a PCA 1 day/wk. Pt currently functioning at Max A for supine to sit using log roll technique, Min A to stand from EOB/recliner with L HHA, and Min guard to take steps toward recliner with +2 for safety. Pt reported low back pain and R hip pain during session (5/10). She required multimodal cues to not use R hand during bed mobility and functional transfers. Pt will benefit from acute OT to increase overall independence in the areas of ADLs and functional mobility in order to safely discharge to next venue of care. Upon hospital discharge, recommend STR to maximize pt  safety and return to PLOF.     Recommendations for follow up therapy are one component of a multi-disciplinary discharge planning process, led by the attending physician.  Recommendations may be updated based on patient status, additional functional criteria and insurance authorization.   Follow Up Recommendations  Skilled nursing-short term rehab (<3 hours/day)     Assistance Recommended at Discharge Frequent or constant Supervision/Assistance  Patient can return home with the following A lot of help with walking and/or transfers;A lot of help with bathing/dressing/bathroom;Assistance with cooking/housework;Assist for transportation;Help with stairs or ramp for entrance    Functional Status Assessment  Patient has had a recent decline in their functional status and demonstrates the ability to make significant improvements in function in a reasonable and predictable amount of time.  Equipment Recommendations  Other (comment) (defer to next venue of care)    Recommendations for Other Services       Precautions / Restrictions Precautions Precautions: Fall Precaution Comments: no BLT for comfort, log roll technique, treat RUE as NWB 2/2 thumb fx Restrictions Weight Bearing Restrictions: No      Mobility Bed Mobility Overal bed mobility: Needs Assistance Bed Mobility: Supine to Sit     Supine to sit: Max assist     General bed mobility comments: pt instructed in log roll technique, VC to not use R hand    Transfers Overall transfer level: Needs assistance Equipment used: 1 person hand held assist Transfers: Sit to/from Stand Sit to Stand: Min assist, From elevated surface           General transfer comment: STS from elevated EOB, STS from recliner (2x), VC for safety and not to use R hand to push up from surface  Balance Overall balance assessment: Needs assistance Sitting-balance support: No upper extremity supported, Feet supported Sitting balance-Leahy  Scale: Good     Standing balance support: Single extremity supported Standing balance-Leahy Scale: Poor                             ADL either performed or assessed with clinical judgement   ADL Overall ADL's : Needs assistance/impaired Eating/Feeding: Set up;Sitting                   Lower Body Dressing: Maximal assistance;Sitting/lateral leans Lower Body Dressing Details (indicate cue type and reason): 2/2 low back pain Toilet Transfer: Minimal assistance Toilet Transfer Details (indicate cue type and reason): simulated with STS from elevated EOB         Functional mobility during ADLs: Min guard;+2 for safety/equipment (to take steps toward recliner with L HHA, could benefit from platform walker for future use)       Vision Patient Visual Report: No change from baseline       Perception     Praxis      Pertinent Vitals/Pain Pain Assessment Pain Assessment: 0-10 Pain Score: 5  Pain Location: R hip, low back Pain Descriptors / Indicators: Sore, Discomfort Pain Intervention(s): Limited activity within patient's tolerance, Monitored during session, Premedicated before session, Repositioned     Hand Dominance Right   Extremity/Trunk Assessment Upper Extremity Assessment Upper Extremity Assessment: Generalized weakness;RUE deficits/detail RUE Deficits / Details: in splint, thumb fx RUE: Unable to fully assess due to immobilization   Lower Extremity Assessment Lower Extremity Assessment: Generalized weakness   Cervical / Trunk Assessment Cervical / Trunk Assessment: Normal   Communication Communication Communication: No difficulties   Cognition Arousal/Alertness: Awake/alert Behavior During Therapy: Flat affect Overall Cognitive Status: No family/caregiver present to determine baseline cognitive functioning                                 General Comments: A&Ox3 (d/o to time), able to follow all commands. Daughter on the  phone reported pt has not worn back brace in 2 years, pt reports she has been wearing it when OOB and knows exactly where it is at home.     General Comments  R thumb swollen/bruised, pt able to move thumb freely, not sure if splint could be modified to improve fit/immobilization? RN aware    Exercises Other Exercises Other Exercises: OT provided education re: role of OT, OT POC, post acute recs, sitting up for all meals, EOB/OOB mobility with assistance, home/fall safety.     Shoulder Instructions      Home Living Family/patient expects to be discharged to:: Private residence Living Arrangements: Children (pt's daughter (who is disabled); pt moved in 2 months ago) Available Help at Discharge: Family;Personal care attendant;Available PRN/intermittently Type of Home: House Home Access: Stairs to enter [employer] of Steps: 2 Entrance Stairs-Rails: Left Home Layout: Two level;Able to live on main level with bedroom/bathroom     Bathroom Shower/Tub: Tub/shower unit;Sponge bathes at baseline   [employer]: Standard     Home Equipment: Other (comment);Rolling Walker (2 wheels) [phone])   Additional Comments: Spoke with pt's daughter [name]): pt hasn't worn back brace past 2 years--needs new one.  Pt had fall 6 months ago as well.  H/o L4 fx from fall 2 years ago.      Prior Functioning/Environment Prior Level of  Function : Needs assist             Mobility Comments: Ambulatory with RW in home limited household distances. Pt with fall 6 months ago and one last week (dtr reported on thurs or friday). ADLs Comments: Daughter assists with driving, groceries, cleaning, meal prep; also cares for grandchildren. Pt takes sponge baths. Pt has PCA (bath 1x/week, assists with getting OOB, transfers, dressing).        OT Problem List: Decreased strength;Decreased range of motion;Decreased activity tolerance;Impaired balance (sitting and/or standing);Decreased knowledge of  precautions;Decreased knowledge of use of DME or AE;Decreased safety awareness;Decreased cognition;Pain;Increased edema; Impaired UE functional use      OT Treatment/Interventions: Self-care/ADL training;Splinting;Therapeutic exercise;Therapeutic activities;DME and/or AE instruction;Patient/family education;Balance training    OT Goals(Current goals can be found in the care plan section) Acute Rehab OT Goals Patient Stated Goal: reduce pain & go home OT Goal Formulation: With patient/family Time For Goal Achievement: 01/05/22 Potential to Achieve Goals: Fair   OT Frequency: Min 2X/week    Co-evaluation              AM-PAC OT "6 Clicks" Daily Activity     Outcome Measure Help from another person eating meals?: A Little Help from another person taking care of personal grooming?: A Little Help from another person toileting, which includes using toliet, bedpan, or urinal?: A Lot Help from another person bathing (including washing, rinsing, drying)?: A Lot Help from another person to put on and taking off regular upper body clothing?: A Lot Help from another person to put on and taking off regular lower body clothing?: A Lot 6 Click Score: 14   End of Session Equipment Utilized During Treatment: Gait belt Nurse Communication: Mobility status;Precautions  Activity Tolerance: Patient tolerated treatment well;Patient limited by pain Patient left: in chair;with call bell/phone within reach;with chair alarm set;with nursing/sitter in room (sitter in room)  OT Visit Diagnosis: Unsteadiness on feet (R26.81);History of falling (Z91.81);Pain Pain - Right/Left: Right Pain - part of body: Hip (low back)                Time: 0930-1000 OT Time Calculation (min): 30 min Charges:  OT General Charges $OT Visit: 1 Visit OT Evaluation $OT Eval Low Complexity: 1 Low  [facility] MS, OTR/L ascom [phone]  12/22/21, 1:51 PM

## 2021-12-22 NOTE — Progress Notes (Signed)
End of shift note:  Pt received schedule medications. Sitter present in the room assisting with care. VSS. Prn tylenol given for pain.

## 2021-12-22 NOTE — Evaluation (Signed)
Physical Therapy Evaluation Patient Details Name: Theresa Snyder MRN: [id number] DOB: 1929/12/20 Today's Date: 12/22/2021  History of Present Illness  Pt is a 88 y.o. female presenting to hospital 12/20/21 s/p witnessed fall.  Imaging showing "acute, comminuted intra-articular fracture of the base of the right first proximal phalanx.  Age indeterminate superior endplate fracture of L3.  Stable L4 compression deformity."  Hospitalized Sept 2023 with acute encephalopathy attributed to dehydration and possible TIA.  Pt admitted with acute metabolic encephalopathy, fall at home, superior endplate fx of L3 and stable L4 compression deformity (no TLSO recommended by neurosurgery per notes; recommending conservative management), sinus tachycardia, hypomagnesemia, close fx R thumb, and acute renal failure.  PMH includes CKD, htn, lymphedema, syncope, seizures, spinal stenosis, large hiatal hernia, CHF.  Of note, pt s/p L hip hemiarthroplasty 07/10/21.  Clinical Impression  Prior to hospital admission, pt was ambulatory with RW limited household distances; lives with her daughter (who is disabled and has limited ability to assist pt).  Pt initially sleeping in recliner upon PT arrival and drowsy but became alert with activities.  Pt reporting minimal back pain during session but reporting R hip pain that increased with functional mobility.  Currently pt is min to mod assist for transfers with L hand hold assist (2nd assist for safety) and mod assist to lay down in bed.  Pt requiring vc's and tactile cues to not use R hand during sessions activities d/t R hand fx.  Pt would benefit from skilled PT to address noted impairments and functional limitations (see below for any additional details).  Upon hospital discharge, pt would benefit from SNF.    Recommendations for follow up therapy are one component of a multi-disciplinary discharge planning process, led by the attending physician.  Recommendations may be updated  based on patient status, additional functional criteria and insurance authorization.  Follow Up Recommendations Skilled nursing-short term rehab (<3 hours/day) Can patient physically be transported by private vehicle: No    Assistance Recommended at Discharge Frequent or constant Supervision/Assistance  Patient can return home with the following  A lot of help with walking and/or transfers;A lot of help with bathing/dressing/bathroom;Assistance with cooking/housework;Assist for transportation;Help with stairs or ramp for entrance    Equipment Recommendations Other (comment) (TBD at next facility)  Recommendations for Other Services  OT consult    Functional Status Assessment Patient has had a recent decline in their functional status and demonstrates the ability to make significant improvements in function in a reasonable and predictable amount of time.     Precautions / Restrictions Precautions Precautions: Fall Restrictions Weight Bearing Restrictions: No      Mobility  Bed Mobility Overal bed mobility: Needs Assistance Bed Mobility: Sit to Supine       Sit to supine: Mod assist, HOB elevated   General bed mobility comments: assist for trunk and B LE's; vc's for safe technique    Transfers Overall transfer level: Needs assistance Equipment used: 1 person hand held assist Transfers: Sit to/from Stand, Bed to chair/wheelchair/BSC Sit to Stand: Min assist, Mod assist   Step pivot transfers: Min assist, Mod assist, +2 physical assistance (CGA of 2nd for safety)       General transfer comment: vc's for safety and not to use R hand    Ambulation/Gait               General Gait Details: deferred d/t R hip pain  Stairs  Wheelchair Mobility    Modified Rankin (Stroke Patients Only)       Balance Overall balance assessment: Needs assistance Sitting-balance support: No upper extremity supported, Feet supported Sitting balance-Leahy Scale:  Good Sitting balance - Comments: steady sitting reaching within BOS   Standing balance support: Single extremity supported Standing balance-Leahy Scale: Poor Standing balance comment: assist for static balance                             Pertinent Vitals/Pain Pain Assessment Pain Assessment: Faces Faces Pain Scale:  (2/10 at rest; 6/10 with activity) Pain Location: R hip Pain Descriptors / Indicators: Sore, Discomfort Pain Intervention(s): Limited activity within patient's tolerance, Monitored during session, Premedicated before session, Repositioned Pt's HR 50-54 bpm at rest and 70-80 bpm with activity (nurse notified).    Home Living Family/patient expects to be discharged to:: Private residence Living Arrangements: Children (pt's daughter (who is disabled); pt moved in 2 months ago) Available Help at Discharge: Family;Available PRN/intermittently Type of Home: House Home Access: Stairs to enter Entrance Stairs-Rails: Left Entrance Stairs-Number of Steps: 2   Home Layout: Two level;Able to live on main level with bedroom/bathroom Home Equipment: Other (comment);Rolling Walker (2 wheels) (3ww) Additional Comments: Per OT (per pt's daughter): pt hasn't worn back brace past 2 years--needs new one.  Pt had fall 6 months ago as well.  H/o L4 fx from fall 2 years ago.    Prior Function Prior Level of Function : Needs assist             Mobility Comments: Ambulatory with RW in home limited household distances.  Pt with fall 6 months ago. ADLs Comments: Daughter assists with driving, groceries, cleaning, meal prep; also cares for grandchildren.  Pt takes sponge baths.  Pt has PCA (bath 1x/week, assists with getting OOB, transfers, dressing).     Hand Dominance        Extremity/Trunk Assessment   Upper Extremity Assessment Upper Extremity Assessment:  (R hand/wrist in splint; at least 3/5 AROM B elbow flexion/extension and B shoulder flexion to grossly 80 degrees;  good L hand grip strength)    Lower Extremity Assessment Lower Extremity Assessment: Generalized weakness (at least 3/5 AROM B hip flexion, knee flexion/extension, and DF/PF)    Cervical / Trunk Assessment Cervical / Trunk Assessment: Normal  Communication   Communication: No difficulties  Cognition Arousal/Alertness:  (pt sleeping initially and drowsy but became alert with activity) Behavior During Therapy: Flat affect Overall Cognitive Status: Impaired/Different from baseline (pt oriented to person, month/day of DOB (not year); pt reported she was at home and not aware of current situation)                                          General Comments General comments (skin integrity, edema, etc.): R thumb appearing swollen and bruised (question if R hand splint could be improved for better fit/immobilization for R thumb?--nurse notified and reported she would check on it).    Exercises     Assessment/Plan    PT Assessment Patient needs continued PT services  PT Problem List Decreased strength;Decreased activity tolerance;Decreased balance;Decreased mobility;Decreased cognition;Decreased knowledge of use of DME;Decreased knowledge of precautions;Decreased safety awareness;Pain       PT Treatment Interventions DME instruction;Gait training;Stair training;Functional mobility training;Therapeutic activities;Therapeutic exercise;Balance training;Patient/family education    PT Goals (  Current goals can be found in the Care Plan section)  Acute Rehab PT Goals Patient Stated Goal: to improve mobility and pain PT Goal Formulation: With patient Time For Goal Achievement: 01/05/22 Potential to Achieve Goals: Fair    Frequency Min 2X/week     Co-evaluation               AM-PAC PT "6 Clicks" Mobility  Outcome Measure Help needed turning from your back to your side while in a flat bed without using bedrails?: A Little Help needed moving from lying on your back  to sitting on the side of a flat bed without using bedrails?: A Lot Help needed moving to and from a bed to a chair (including a wheelchair)?: A Lot Help needed standing up from a chair using your arms (e.g., wheelchair or bedside chair)?: A Lot Help needed to walk in hospital room?: A Lot Help needed climbing 3-5 steps with a railing? : Total 6 Click Score: 12    End of Session Equipment Utilized During Treatment: Gait belt Activity Tolerance: Patient limited by pain Patient left: in bed;with call bell/phone within reach;with bed alarm set;with nursing/sitter in room;Other (comment) (Sitter in room) Nurse Communication: Mobility status;Precautions;Weight bearing status;Other (comment) (R hand splint concerns; pt's HR during session) PT Visit Diagnosis: Unsteadiness on feet (R26.81);Muscle weakness (generalized) (M62.81);History of falling (Z91.81);Other abnormalities of gait and mobility (R26.89);Pain Pain - Right/Left: Right Pain - part of body: Hip    Time: 6553-7482 PT Time Calculation (min) (ACUTE ONLY): 20 min   Charges:   PT Evaluation $PT Eval Low Complexity: 1 Low         Leevon Upperman, PT 12/22/21, 1:22 PM

## 2021-12-22 NOTE — Progress Notes (Signed)
PROGRESS NOTE    Theresa Snyder  XIP:[id number] DOB: 1929/03/03 DOA: 12/20/2021 PCP: [facility]   Brief Narrative:  88 year old female with history of HTN, hypothyroidism, CKD stage III diastolic CHF, TIA comes to the hospital for evaluation of fall.  Patient has been somewhat confused at home.  Upon admission patient was noted to be tachycardic, mildly elevated creatinine, leukocytosis.  Lumbar x-ray showed compression fracture.  Patient was seen by neurosurgery and recommended conservative management.  Hospital course was complicated by atrial fibrillation with RVR requiring additional doses of Lopressor and increasing her metoprolol.  Eventually she converted back to normal sinus rhythm.   Assessment & Plan:  Principal Problem:   Acute metabolic encephalopathy Active Problems:   Fall at home, initial encounter   Sinus tachycardia   Closed fracture of right thumb   Hypomagnesemia   Hypothyroidism   Atrial fibrillation with RVR (HCC)     Assessment and Plan: * Acute metabolic encephalopathy Unknown cause, UA, TSH, folate, ammonia are normal.  Does have B12 deficiency therefore supplement started. CT head not performed during admission??? No focal deficits, PT/OT hopefully she can work with them today as she is in normal sinus rhythm  Fall at home, initial encounter Superior endplate fracture of L3 and stable L4 compression deformity. Seen by NeuroSx, conservative management. Supportive care.  No focal Neuro deficits.   Sinus tachycardia, now Afib rvr Essential HTN She is now converted back to normal sinus rhythm and due to some evidence of bradycardia we will reduce her metoprolol back down to 25 mg twice daily Cont home Norvasc, Benazepril Would avoid AC, she is a high risk with multiple falls.  Discussed with patient's daughter at patient's request  Vitamin B12 deficiency - Supplements  HLD -Statin  Peripheral Neuropathy RLS Cont  gabapentin.  Also on Home Requip at Bedtime.   Hypomagnesemia Replete prn.   Closed fracture of right thumb Received splinted in the ED  Chronic kidney disease Stage 3a(HCC) IV hydration and monitor. Cr at baseline of 1.5 No evidence of AKI    DVT prophylaxis: enoxaparin (LOVENOX) injection 30 mg Start: 12/21/21 0800 Code Status: Full Family Communication:  Idelle Jo  Awaiting PT/OT evaluation thereafter final disposition plans Nutritional status          Body mass index is 24.54 kg/m.  Pressure Injury 12/21/21 Toe (Comment  which one) Left Unstageable - Full thickness tissue loss in which the base of the injury is covered by slough (yellow, tan, gray, green or brown) and/or eschar (tan, brown or black) in the wound bed. unstageable pre (Active)  12/21/21 0416  Location: Toe (Comment  which one)  Location Orientation: Left  Staging: Unstageable - Full thickness tissue loss in which the base of the injury is covered by slough (yellow, tan, gray, green or brown) and/or eschar (tan, brown or black) in the wound bed.  Wound Description (Comments): unstageable pressure ulcer to left big toe  Present on Admission: Yes        Subjective: Now back in NSR, no complaints.   Examination: Constitutional: Not in acute distress, elderly frail Respiratory: Clear to auscultation bilaterally Cardiovascular: Normal sinus rhythm, no rubs Abdomen: Nontender nondistended good bowel sounds Musculoskeletal: No edema noted Skin: No rashes seen Neurologic: CN 2-12 grossly intact.  And nonfocal Psychiatric: Normal judgment and insight. Alert and oriented x 3. Normal mood.   Objective: Vitals:   12/21/21 1649 12/21/21 2003 12/22/21 0012 12/22/21 0527  BP: 115/68 128/68 Marland Kitchen)  157/79 (!) 147/51  Pulse:  98 74 (!) 53  Resp: '16 18 18 18  '$ Temp: 98.1 F (36.7 C) 98.3 F (36.8 C)  98 F (36.7 C)  TempSrc: Oral Oral  Oral  SpO2: 96% 100% 97% 93%  Weight:      Height:         Intake/Output Summary (Last 24 hours) at 12/22/2021 0803 Last data filed at 12/22/2021 0604 Gross per 24 hour  Intake 2174.9 ml  Output 700 ml  Net 1474.9 ml   Filed Weights   12/20/21 2312 12/21/21 0357  Weight: 55.3 kg 57 kg     Data Reviewed:   CBC: Recent Labs  Lab 12/20/21 2315 12/21/21 0530 12/22/21 0509  WBC 12.6* 11.4* 9.1  NEUTROABS 10.1*  --   --   HGB 11.7* 11.4* 10.0*  HCT 35.0* 34.3* 29.6*  MCV 91.6 90.7 91.6  PLT 244 246 211   Basic Metabolic Panel: Recent Labs  Lab 12/20/21 2315 12/21/21 0530 12/22/21 0509  NA 135  --  137  K 3.7  --  4.2  CL 101  --  106  CO2 26  --  25  GLUCOSE 139*  --  105*  BUN 32*  --  36*  CREATININE 1.62* 1.37* 1.58*  CALCIUM 8.7*  --  8.4*  MG 1.4*  --  1.8   GFR: Estimated Creatinine Clearance: 18.7 mL/min (A) (by C-G formula based on SCr of 1.58 mg/dL (H)). Liver Function Tests: Recent Labs  Lab 12/20/21 2315  AST 26  ALT 13  ALKPHOS 88  BILITOT 1.3*  PROT 7.7  ALBUMIN 3.9   No results for input(s): "LIPASE", "AMYLASE" in the last 168 hours. Recent Labs  Lab 12/21/21 0900  AMMONIA <10   Coagulation Profile: No results for input(s): "INR", "PROTIME" in the last 168 hours. Cardiac Enzymes: No results for input(s): "CKTOTAL", "CKMB", "CKMBINDEX", "TROPONINI" in the last 168 hours. BNP (last 3 results) No results for input(s): "PROBNP" in the last 8760 hours. HbA1C: No results for input(s): "HGBA1C" in the last 72 hours. CBG: No results for input(s): "GLUCAP" in the last 168 hours. Lipid Profile: No results for input(s): "CHOL", "HDL", "LDLCALC", "TRIG", "CHOLHDL", "LDLDIRECT" in the last 72 hours. Thyroid Function Tests: Recent Labs    12/21/21 0900  TSH 4.312   Anemia Panel: Recent Labs    12/21/21 0900 12/22/21 0509  VITAMINB12 <50*  --   FOLATE  --  12.5   Sepsis Labs: Recent Labs  Lab 12/20/21 2315  LATICACIDVEN 1.7    Recent Results (from the past 240 hour(s))  Blood  culture (routine single)     Status: None (Preliminary result)   Collection Time: 12/20/21 11:15 PM   Specimen: BLOOD  Result Value Ref Range Status   Specimen Description BLOOD BOTTLES DRAWN AEROBIC AND ANAEROBIC  Final   Special Requests BLOOD Blood Culture adequate volume  Final   Culture   Final    NO GROWTH 1 DAY Performed at [facility], [address], [place], [place] [address]    Report Status PENDING  Incomplete         Radiology Studies: DG Knee 2 Views Right  Result Date: 12/21/2021 CLINICAL DATA:  Right knee pain, fall EXAM: RIGHT KNEE - 1-2 VIEW COMPARISON:  None Available. FINDINGS: Normal alignment. No acute fracture or dislocation. Joint spaces are not optimally profiled but appear preserved. No effusion. Soft tissues are unremarkable. IMPRESSION: 1. Negative. Electronically Signed   By:  Fidela Salisbury M.D.   On: 12/21/2021 00:00   DG Lumbar Spine 2-3 Views  Result Date: 12/20/2021 CLINICAL DATA:  Fall, back pain EXAM: LUMBAR SPINE - 2-3 VIEW COMPARISON:  03/24/2020 FINDINGS: Compression deformity of L4 is again identified with 50-60% loss of height. Age indeterminate superior endplate fracture of L3 has developed. No retropulsion. Remaining vertebral body height is preserved. There is diffuse intervertebral disc space narrowing and endplate remodeling throughout the visualized thoracolumbar spine, most severe at T11-L2 in keeping with changes of moderate to severe degenerative disc disease. Vascular calcifications are seen within the abdominal aorta. IMPRESSION: 1. Age indeterminate superior endplate fracture of L3. 2. Stable L4 compression deformity. 3. Moderate to severe degenerative disc disease. Electronically Signed   By: Fidela Salisbury M.D.   On: 12/20/2021 23:59   DG Hand Complete Right  Result Date: 12/20/2021 CLINICAL DATA:  Fall, right hand pain EXAM: RIGHT HAND - COMPLETE 3+ VIEW COMPARISON:  None Available. FINDINGS: There is an acute,  comminuted intra-articular fracture of the base of the right first proximal phalanx with a 1 mm articular gap noted. Extensive soft tissue swelling of the right thumb. No other fracture or dislocation identified. Osseous structures are diffusely osteopenic. Degenerative changes are noted at the base of the thumb and second distal interphalangeal joint. IMPRESSION: 1. Acute, comminuted intra-articular fracture of the base of the right first proximal phalanx. Electronically Signed   By: Fidela Salisbury M.D.   On: 12/20/2021 23:53   DG Chest 1 View  Result Date: 12/20/2021 CLINICAL DATA:  Fall, chest pain EXAM: CHEST  1 VIEW COMPARISON:  10/31/2021 FINDINGS: Large hiatal hernia again identified containing the transverse colon. Lungs are clear. No pneumothorax or pleural effusion. Cardiac size within normal limits. Pulmonary vascularity is normal. Healed right rib fracture noted. No acute bone abnormality. IMPRESSION: 1. No active disease. 2. Large hiatal hernia. Electronically Signed   By: Fidela Salisbury M.D.   On: 12/20/2021 23:36        Scheduled Meds:  amLODipine  10 mg Oral Q breakfast   benazepril  10 mg Oral Daily   cholecalciferol  1,000 Units Oral Daily   enoxaparin (LOVENOX) injection  30 mg Subcutaneous Q24H   feeding supplement  237 mL Oral TID BM   gabapentin  300 mg Oral TID   levothyroxine  100 mcg Oral Q0600   metoprolol tartrate  25 mg Oral BID   pravastatin  20 mg Oral QHS   rOPINIRole  0.25 mg Oral QHS   Continuous Infusions:  sodium chloride 75 mL/hr at 12/22/21 0604     LOS: 1 day   Time spent= 35 mins    Mayia Megill Arsenio Loader, MD [facility]  If 7PM-7AM, please contact night-coverage  12/22/2021, 8:03 AM

## 2021-12-22 NOTE — TOC Progression Note (Signed)
Transition of Care [facility]) - Progression Note    Patient Details  Name: Theresa Snyder MRN: [id number] Date of Birth: 08/15/1929  Transition of Care [facility]) CM/SW [place], RN Phone Number: 12/22/2021, 2:06 PM  Clinical Narrative:     Spoke with the patient and her Daughter [name], They are agreeable to a bed search for STR SNF The patient recently moved in with her daughter She has [facility]  FL2 completed, PASSR obtained Bedsearch sent, will review bed offers once obtinaed      Expected Discharge Plan and Services                                                 Social Determinants of Health (SDOH) Interventions Housing Interventions: Patient Refused  Readmission Risk Interventions     No data to display

## 2021-12-23 ENCOUNTER — Telehealth: Payer: Self-pay

## 2021-12-23 DIAGNOSIS — G9341 Metabolic encephalopathy: Secondary | ICD-10-CM | POA: Diagnosis not present

## 2021-12-23 LAB — BASIC METABOLIC PANEL
Anion gap: 6 (ref 5–15)
BUN: 40 mg/dL — ABNORMAL HIGH (ref 8–23)
CO2: 25 mmol/L (ref 22–32)
Calcium: 8.4 mg/dL — ABNORMAL LOW (ref 8.9–10.3)
Chloride: 103 mmol/L (ref 98–111)
Creatinine, Ser: 1.77 mg/dL — ABNORMAL HIGH (ref 0.44–1.00)
GFR, Estimated: 27 mL/min — ABNORMAL LOW (ref >60)
Glucose, Bld: 98 mg/dL (ref 70–99)
Potassium: 4.5 mmol/L (ref 3.5–5.1)
Sodium: 134 mmol/L — ABNORMAL LOW (ref 135–145)

## 2021-12-23 LAB — CBC
HCT: 30.5 % — ABNORMAL LOW (ref 36.0–46.0)
Hemoglobin: 10 g/dL — ABNORMAL LOW (ref 12.0–15.0)
MCH: 30.5 pg (ref 26.0–34.0)
MCHC: 32.8 g/dL (ref 30.0–36.0)
MCV: 93 fL (ref 80.0–100.0)
Platelets: 247 10*3/uL (ref 150–400)
RBC: 3.28 MIL/uL — ABNORMAL LOW (ref 3.87–5.11)
RDW: 13.3 % (ref 11.5–15.5)
WBC: 8.2 10*3/uL (ref 4.0–10.5)
nRBC: 0 % (ref 0.0–0.2)

## 2021-12-23 LAB — MAGNESIUM: Magnesium: 1.8 mg/dL (ref 1.7–2.4)

## 2021-12-23 MED ORDER — SODIUM CHLORIDE 0.9 % IV SOLN: INTRAVENOUS | Status: DC

## 2021-12-23 NOTE — Telephone Encounter (Signed)
Dr Tamala Julian saw her as a consult on 12/21/21 for an L3 and L4 compression fracture. Per Dr Izora Ribas, she needs an appointment with Marzetta Board or Andee Poles in 2-3 weeks. She will need xray that day. Dr Izora Ribas said it is OK to schedule w/ him if both PA's are full. Will be a new patient appointment unless Dr Izora Ribas sees her in the hospital before she is discharged.

## 2021-12-23 NOTE — Progress Notes (Signed)
PROGRESS NOTE    Theresa Snyder  QQV:[id number] DOB: Jun 28, 1929 DOA: 12/20/2021 PCP: [facility] [place]   Brief Narrative:  88 year old female with history of HTN, hypothyroidism, CKD stage III diastolic CHF, TIA comes to the hospital for evaluation of fall.  Patient has been somewhat confused at home.  Upon admission patient was noted to be tachycardic, mildly elevated creatinine, leukocytosis.  Lumbar x-ray showed compression fracture.  Patient was seen by neurosurgery and recommended conservative management.  Hospital course was complicated by atrial fibrillation with RVR requiring additional doses of Lopressor and increasing her metoprolol.  Eventually she converted back to normal sinus rhythm therefore metoprolol reduced back to home dose.  Neurosurgery also evaluated the patient overall arrange for outpatient follow-up.  PT/OT recommended SNF.   Assessment & Plan:  Principal Problem:   Acute metabolic encephalopathy Active Problems:   Fall at home, initial encounter   Sinus tachycardia   Closed fracture of right thumb   Hypomagnesemia   Hypothyroidism   Atrial fibrillation with RVR (HCC)     Assessment and Plan: * Acute metabolic encephalopathy, resolved Unknown cause, UA, TSH, folate, ammonia are normal.  Does have B12 deficiency therefore supplement started. CT head not performed during admission??? No focal deficits, PT/OT recommending SNF.  Fall at home, initial encounter Superior endplate fracture of L3 and stable L4 compression deformity. Seen by NeuroSx, conservative management. Supportive care.  No focal Neuro deficits.  Neurosurgery will arrange for outpatient follow-up  Atrial fibrillation with RVR now in normal sinus rhythm Essential HTN She is now back to normal sinus rhythm therefore metoprolol back down to home dose of 25 mg twice daily Cont home Norvasc, Benazepril Would avoid AC, she is a high risk with multiple falls.  Discussed with  patient's daughter at patient's request  Dehydration - Mild clinical dehydration therefore will give gentle IV fluid for next 8 hours  Vitamin B12 deficiency - Supplements  HLD -Statin  Peripheral Neuropathy RLS Cont gabapentin.  Also on Home Requip at Bedtime.   Hypomagnesemia Replete prn.   Closed fracture of right thumb Received splinted in the ED  Chronic kidney disease Stage 3a(HCC) IV hydration and monitor. Cr at baseline of 1.5 No evidence of AKI  PT/OT-SNF  DVT prophylaxis: enoxaparin (LOVENOX) injection 30 mg Start: 12/21/21 0800 Code Status: Full Family Communication: Daughter [name] has been updated periodically  Pending SNF placement.  TOC working on this Nutritional status          Body mass index is 24.54 kg/m.  Pressure Injury 12/21/21 Toe (Comment  which one) Left Unstageable - Full thickness tissue loss in which the base of the injury is covered by slough (yellow, tan, gray, green or brown) and/or eschar (tan, brown or black) in the wound bed. unstageable pre (Active)  12/21/21 0416  Location: Toe (Comment  which one)  Location Orientation: Left  Staging: Unstageable - Full thickness tissue loss in which the base of the injury is covered by slough (yellow, tan, gray, green or brown) and/or eschar (tan, brown or black) in the wound bed.  Wound Description (Comments): unstageable pressure ulcer to left big toe  Present on Admission: Yes        Subjective: No complaints this morning doing well.  Remains in normal sinus rhythm  Examination: Constitutional: Not in acute distress.  Elderly frail Respiratory: Clear to auscultation bilaterally Cardiovascular: Normal sinus rhythm, no rubs Abdomen: Nontender nondistended good bowel sounds Musculoskeletal: No edema noted Skin: No rashes seen  Neurologic: CN 2-12 grossly intact.  And nonfocal Psychiatric: Normal judgment and insight. Alert and oriented x 3. Normal mood. Objective: Vitals:    12/22/21 1624 12/22/21 1822 12/22/21 2012 12/23/21 0429  BP: (!) 93/45 (!) 109/50 (!) 112/56 117/70  Pulse: (!) 54  60 (!) 58  Resp: '18  17 17  '$ Temp: (!) 97.4 F (36.3 C)  97.6 F (36.4 C) 98.2 F (36.8 C)  TempSrc: Oral  Oral   SpO2: 96%  97% 96%  Weight:      Height:        Intake/Output Summary (Last 24 hours) at 12/23/2021 0815 Last data filed at 12/22/2021 1636 Gross per 24 hour  Intake 250 ml  Output 450 ml  Net -200 ml   Filed Weights   12/20/21 2312 12/21/21 0357  Weight: 55.3 kg 57 kg     Data Reviewed:   CBC: Recent Labs  Lab 12/20/21 2315 12/21/21 0530 12/22/21 0509 12/23/21 0513  WBC 12.6* 11.4* 9.1 8.2  NEUTROABS 10.1*  --   --   --   HGB 11.7* 11.4* 10.0* 10.0*  HCT 35.0* 34.3* 29.6* 30.5*  MCV 91.6 90.7 91.6 93.0  PLT 244 246 231 384   Basic Metabolic Panel: Recent Labs  Lab 12/20/21 2315 12/21/21 0530 12/22/21 0509 12/23/21 0513  NA 135  --  137 134*  K 3.7  --  4.2 4.5  CL 101  --  106 103  CO2 26  --  25 25  GLUCOSE 139*  --  105* 98  BUN 32*  --  36* 40*  CREATININE 1.62* 1.37* 1.58* 1.77*  CALCIUM 8.7*  --  8.4* 8.4*  MG 1.4*  --  1.8 1.8   GFR: Estimated Creatinine Clearance: 16.7 mL/min (A) (by C-G formula based on SCr of 1.77 mg/dL (H)). Liver Function Tests: Recent Labs  Lab 12/20/21 2315  AST 26  ALT 13  ALKPHOS 88  BILITOT 1.3*  PROT 7.7  ALBUMIN 3.9   No results for input(s): "LIPASE", "AMYLASE" in the last 168 hours. Recent Labs  Lab 12/21/21 0900  AMMONIA <10   Coagulation Profile: No results for input(s): "INR", "PROTIME" in the last 168 hours. Cardiac Enzymes: No results for input(s): "CKTOTAL", "CKMB", "CKMBINDEX", "TROPONINI" in the last 168 hours. BNP (last 3 results) No results for input(s): "PROBNP" in the last 8760 hours. HbA1C: No results for input(s): "HGBA1C" in the last 72 hours. CBG: Recent Labs  Lab 12/22/21 1322  GLUCAP 90   Lipid Profile: No results for input(s): "CHOL", "HDL",  "LDLCALC", "TRIG", "CHOLHDL", "LDLDIRECT" in the last 72 hours. Thyroid Function Tests: Recent Labs    12/21/21 0900  TSH 4.312   Anemia Panel: Recent Labs    12/21/21 0900 12/22/21 0509  VITAMINB12 <50*  --   FOLATE  --  12.5   Sepsis Labs: Recent Labs  Lab 12/20/21 2315  LATICACIDVEN 1.7    Recent Results (from the past 240 hour(s))  Blood culture (routine single)     Status: None (Preliminary result)   Collection Time: 12/20/21 11:15 PM   Specimen: BLOOD  Result Value Ref Range Status   Specimen Description BLOOD BOTTLES DRAWN AEROBIC AND ANAEROBIC  Final   Special Requests BLOOD Blood Culture adequate volume  Final   Culture   Final    NO GROWTH 2 DAYS Performed at [facility], [address] [place], [place] [address]    Report Status PENDING  Incomplete  Urine Culture  Status: Abnormal   Collection Time: 12/20/21 11:15 PM   Specimen: In/Out Cath Urine  Result Value Ref Range Status   Specimen Description   Final    IN/OUT CATH URINE Performed at [facility], [address], [place], [place] [address]    Special Requests   Final    NONE Performed at [facility], [address] [place], [place] [address]    Culture MULTIPLE SPECIES PRESENT, SUGGEST RECOLLECTION (A)  Final   Report Status 12/22/2021 FINAL  Final         Radiology Studies: No results found.      Scheduled Meds:  amLODipine  10 mg Oral Q breakfast   benazepril  10 mg Oral Daily   cholecalciferol  1,000 Units Oral Daily   vitamin B-12  1,000 mcg Oral Daily   enoxaparin (LOVENOX) injection  30 mg Subcutaneous Q24H   feeding supplement  237 mL Oral TID BM   gabapentin  300 mg Oral TID   levothyroxine  100 mcg Oral Q0600   metoprolol tartrate  25 mg Oral BID   pravastatin  20 mg Oral QHS   rOPINIRole  0.25 mg Oral QHS   Continuous Infusions:  sodium chloride       LOS: 2 days   Time spent= 35 mins    Adriene Knipfer Arsenio Loader, MD [facility]  If 7PM-7AM, please contact night-coverage  12/23/2021, 8:15 AM

## 2021-12-23 NOTE — Progress Notes (Signed)
Occupational Therapy Treatment Patient Details Name: Theresa Snyder MRN: [id number] DOB: 1929/06/09 Today's Date: 12/23/2021   History of present illness Pt is a 88 y.o. female presenting to hospital 12/20/21 s/p witnessed fall.  Imaging showing "acute, comminuted intra-articular fracture of the base of the right first proximal phalanx.  Age indeterminate superior endplate fracture of L3.  Stable L4 compression deformity."  Hospitalized Sept 2023 with acute encephalopathy attributed to dehydration and possible TIA.  Pt admitted with acute metabolic encephalopathy, fall at home, superior endplate fx of L3 and stable L4 compression deformity (no TLSO recommended by neurosurgery per notes; recommending conservative management), sinus tachycardia, hypomagnesemia, close fx R thumb, and acute renal failure.  PMH includes CKD, htn, lymphedema, syncope, seizures, spinal stenosis, large hiatal hernia, CHF.  Of note, pt s/p L hip hemiarthroplasty 07/10/21.   OT comments  Upon entering session, pt resting in bed. Pt disoriented to time and place. Agreeable to attempt sitting at EOB, however, unable to tolerate bed mobility despite Max A 2/2 back pain. Pt returned to supine and repositioned in bed. Pt left as received with all needs in reach. D/C recommendation remains appropriate. OT will continue to follow acutely.     Recommendations for follow up therapy are one component of a multi-disciplinary discharge planning process, led by the attending physician.  Recommendations may be updated based on patient status, additional functional criteria and insurance authorization.    Follow Up Recommendations  Skilled nursing-short term rehab (<3 hours/day)     Assistance Recommended at Discharge Frequent or constant Supervision/Assistance  Patient can return home with the following  A lot of help with walking and/or transfers;A lot of help with bathing/dressing/bathroom;Assistance with cooking/housework;Assist for  transportation;Help with stairs or ramp for entrance   Equipment Recommendations  Other (comment) (defer to next venue of care)    Recommendations for Other Services      Precautions / Restrictions Precautions Precautions: Fall Precaution Comments: no BLT for comfort, log roll technique, treat RUE as NWB 2/2 thumb fx Restrictions Weight Bearing Restrictions: No       Mobility Bed Mobility Overal bed mobility: Needs Assistance Bed Mobility: Sidelying to Sit   Sidelying to sit: Max assist       General bed mobility comments: Attempting log roll technique with bed mobility, however, unable to tolerate 2/2 back pain. Did not make it all the way to EOB before having to return pt to supine. Total A to scoot up toward [place], unable to tolerated bed being completely flat    Transfers                   General transfer comment: unable to assess this date     Balance Overall balance assessment: Needs assistance   Sitting balance-Leahy Scale: Zero Sitting balance - Comments: pt unable to tolerate sitting EOB 2/2 back pain                                   ADL either performed or assessed with clinical judgement   ADL Overall ADL's : Needs assistance/impaired                                       General ADL Comments: pt unable to tolerate bed mobility/sitting EOB in order to complete grooming tasks, deferred to complete at bed  level 2/2 back pain    Extremity/Trunk Assessment Upper Extremity Assessment Upper Extremity Assessment: Generalized weakness;RUE deficits/detail RUE Deficits / Details: in splint, thumb fx   Lower Extremity Assessment Lower Extremity Assessment: Generalized weakness        Vision Patient Visual Report: No change from baseline     Perception     Praxis      Cognition Arousal/Alertness: Lethargic Behavior During Therapy: Flat affect Overall Cognitive Status: No family/caregiver present to determine  baseline cognitive functioning                                 General Comments: Pt stating that it is "5 am" and "I haven't had my breakfast yet." Therapist attempting to reorient pt to time/place, lights turned on.        Exercises      Shoulder Instructions       General Comments      Pertinent Vitals/ Pain       Pain Assessment Pain Assessment: Faces Faces Pain Scale: Hurts worst Pain Location: low back Pain Descriptors / Indicators: Grimacing, Sharp, Shooting, Guarding Pain Intervention(s): Limited activity within patient's tolerance, Monitored during session, Repositioned  Home Living                                          Prior Functioning/Environment              Frequency  Min 2X/week        Progress Toward Goals  OT Goals(current goals can now be found in the care plan section)  Progress towards OT goals: Progressing toward goals  Acute Rehab OT Goals Patient Stated Goal: reduce pain & go home OT Goal Formulation: With patient/family Time For Goal Achievement: 01/05/22 Potential to Achieve Goals: [place] Discharge plan remains appropriate;Frequency remains appropriate    Co-evaluation                 AM-PAC OT "6 Clicks" Daily Activity     Outcome Measure   Help from another person eating meals?: A Little Help from another person taking care of personal grooming?: A Lot Help from another person toileting, which includes using toliet, bedpan, or urinal?: A Lot Help from another person bathing (including washing, rinsing, drying)?: A Lot Help from another person to put on and taking off regular upper body clothing?: A Lot Help from another person to put on and taking off regular lower body clothing?: A Lot 6 Click Score: 13    End of Session    OT Visit Diagnosis: Unsteadiness on feet (R26.81);History of falling (Z91.81);Pain   Activity Tolerance Patient limited by pain   Patient Left in  bed;with call bell/phone within reach;with bed alarm set   Nurse Communication Mobility status;Other (comment) (MD messaged about increased back pain and possibly ordering new back brace)        Time: 1701-1715 OT Time Calculation (min): 14 min  Charges: OT General Charges $OT Visit: 1 Visit OT Treatments $Self Care/Home Management : 8-22 mins  [facility] MS, OTR/L ascom [phone]  12/23/21, 7:11 PM

## 2021-12-23 NOTE — TOC Progression Note (Signed)
Transition of Care [facility]) - Progression Note    Patient Details  Name: Theresa Snyder MRN: [id number] Date of Birth: Oct 22, 1929  Transition of Care [facility]) CM/SW Contact  Beverly Sessions, RN Phone Number: 12/23/2021, 12:10 PM  Clinical Narrative:        Bed offers presented to daughter [name].  She accepts bed at [facility].  Accepted in [place] and notified Magda Paganini at [employer] [facility]                                                 Social Determinants of Health (SDOH) Interventions Housing Interventions: Patient Refused  Readmission Risk Interventions    12/22/2021    4:06 PM  Readmission Risk Prevention Plan  Transportation Screening Complete  Medication Review (RN Care Manager) Referral to Pharmacy  PCP or Specialist appointment within 3-5 days of discharge Complete  HRI or Home Care Consult Complete  SW Recovery Care/Counseling Consult Complete  [place] Complete

## 2021-12-24 DIAGNOSIS — G9341 Metabolic encephalopathy: Secondary | ICD-10-CM | POA: Diagnosis not present

## 2021-12-24 LAB — BASIC METABOLIC PANEL
Anion gap: 5 (ref 5–15)
BUN: 37 mg/dL — ABNORMAL HIGH (ref 8–23)
CO2: 25 mmol/L (ref 22–32)
Calcium: 8.4 mg/dL — ABNORMAL LOW (ref 8.9–10.3)
Chloride: 107 mmol/L (ref 98–111)
Creatinine, Ser: 1.64 mg/dL — ABNORMAL HIGH (ref 0.44–1.00)
GFR, Estimated: 30 mL/min — ABNORMAL LOW (ref >60)
Glucose, Bld: 96 mg/dL (ref 70–99)
Potassium: 4.6 mmol/L (ref 3.5–5.1)
Sodium: 137 mmol/L (ref 135–145)

## 2021-12-24 LAB — CBC
HCT: 29.5 % — ABNORMAL LOW (ref 36.0–46.0)
Hemoglobin: 9.7 g/dL — ABNORMAL LOW (ref 12.0–15.0)
MCH: 30.4 pg (ref 26.0–34.0)
MCHC: 32.9 g/dL (ref 30.0–36.0)
MCV: 92.5 fL (ref 80.0–100.0)
Platelets: 272 10*3/uL (ref 150–400)
RBC: 3.19 MIL/uL — ABNORMAL LOW (ref 3.87–5.11)
RDW: 13 % (ref 11.5–15.5)
WBC: 7.6 10*3/uL (ref 4.0–10.5)
nRBC: 0 % (ref 0.0–0.2)

## 2021-12-24 LAB — MAGNESIUM: Magnesium: 1.8 mg/dL (ref 1.7–2.4)

## 2021-12-24 MED ORDER — LOPERAMIDE HCL 2 MG PO CAPS
4.0000 mg | ORAL_CAPSULE | ORAL | Status: DC | PRN
  Administered 2021-12-24: 4 mg via ORAL
  Filled 2021-12-24: qty 2

## 2021-12-24 MED ORDER — LOPERAMIDE HCL 2 MG PO CAPS: 4.0000 mg | ORAL_CAPSULE | ORAL | 0 refills | Status: DC | PRN

## 2021-12-24 MED ORDER — HYDROCODONE-ACETAMINOPHEN 10-325 MG PO TABS: 1.0000 | ORAL_TABLET | Freq: Four times a day (QID) | ORAL | 0 refills | Status: DC | PRN

## 2021-12-24 NOTE — Progress Notes (Signed)
PT Cancellation Note  Patient Details Name: ZALEAH TERNES MRN: [id number] DOB: 03/03/1929   Cancelled Treatment:    Reason Eval/Treat Not Completed: Medical issues which prohibited therapy. RN reports patient has had 4 liquid stools this am. Giving her immodium now. Will check back later as time allows.    Novelle Addair 12/24/2021, 11:41 AM

## 2021-12-24 NOTE — Telephone Encounter (Signed)
Patient is still admitted.

## 2021-12-24 NOTE — TOC Transition Note (Signed)
Transition of Care [facility]) - CM/SW Discharge Note   Patient Details  Name: Theresa Snyder MRN: [id number] Date of Birth: 07/20/1929  Transition of Care [facility]) CM/SW Contact:  Beverly Sessions, RN Phone Number: 12/24/2021, 11:33 AM   Clinical Narrative:        Patient will DC TM:[id number]  Anticipated DC date: 12/24/21  Family notified:Laura Transport WS:FKCLE  Per MD patient ready for DC to . RN, patient's family, and facility notified of DC. Discharge Summary sent to facility. RN given number for report.  EMS packet printed to floor. MD has signed controlled substance scripts, requested bedside RN to place in discharge packet Ambulance transport requested for patient.  TOC signing off.  Isaias Cowman [facility] [phone]      Patient Goals and CMS Choice        Discharge Placement                       Discharge Plan and Services                                     Social Determinants of Health (SDOH) Interventions Housing Interventions: Patient Refused   Readmission Risk Interventions    12/22/2021    4:06 PM  Readmission Risk Prevention Plan  Transportation Screening Complete  Medication Review (RN Care Manager) Referral to Pharmacy  PCP or Specialist appointment within 3-5 days of discharge Complete  HRI or Home Care Consult Complete  SW Recovery Care/Counseling Consult Complete  [place] Complete

## 2021-12-24 NOTE — Care Management Important Message (Signed)
Important Message  Patient Details  Name: Theresa Snyder MRN: [id number] Date of Birth: 1929/05/22   Medicare Important Message Given:  Yes     Dannette Barbara 12/24/2021, 12:27 PM

## 2021-12-24 NOTE — Discharge Summary (Signed)
Physician Discharge Summary  Theresa Snyder TDV:[id number] DOB: May 14, 1929 DOA: 12/20/2021  PCP: [facility] [place] date: 12/20/2021 Discharge date: 12/24/2021  Admitted From: Home Disposition:  SNF  Recommendations for Outpatient Follow-up:  Follow up with PCP in 1-2 weeks   Home Health:No Equipment/Devices:None   Discharge Condition:Stable  CODE STATUS:FULL  Diet recommendation: Reg  Brief/Interim Summary: 88 year old female with history of HTN, hypothyroidism, CKD stage III diastolic CHF, TIA comes to the hospital for evaluation of fall.  Patient has been somewhat confused at home.  Upon admission patient was noted to be tachycardic, mildly elevated creatinine, leukocytosis.  Lumbar x-ray showed compression fracture.  Patient was seen by neurosurgery and recommended conservative management.  Hospital course was complicated by atrial fibrillation with RVR requiring additional doses of Lopressor and increasing her metoprolol.  Eventually she converted back to normal sinus rhythm therefore metoprolol reduced back to home dose.  Neurosurgery also evaluated the patient overall arrange for outpatient follow-up.  PT/OT recommended SNF.     Discharge Diagnoses:  Principal Problem:   Acute metabolic encephalopathy Active Problems:   Fall at home, initial encounter   Sinus tachycardia   Closed fracture of right thumb   Hypomagnesemia   Hypothyroidism   Atrial fibrillation with RVR ([place])   Acute metabolic encephalopathy, resolved Unknown cause, UA, TSH, folate, ammonia are normal.  Does have B12 deficiency therefore supplement started. CT head not performed during admission??? No focal deficits, PT/OT recommending SNF.   Fall at home, initial encounter Superior endplate fracture of L3 and stable L4 compression deformity. Seen by NeuroSx, conservative management. Supportive care.  No focal Neuro deficits.  Neurosurgery will arrange for outpatient  follow-up 2-3 weeks with Dr. Izora Ribas or PA   Atrial fibrillation with RVR now in normal sinus rhythm Essential HTN She is now back to normal sinus rhythm therefore metoprolol back down to home dose of 25 mg twice daily Cont home Norvasc, Benazepril Would avoid AC, she is a high risk with multiple falls.   Discussed with patient's daughter at patient's request   Dehydration Improved at time of DC Ensure adequate hydration   Vitamin B12 deficiency - Supplements   HLD -Statin   Peripheral Neuropathy RLS Cont gabapentin.  Also on Home Requip at Bedtime.     Closed fracture of right thumb Received splinted in the ED     Discharge Instructions  Discharge Instructions     Diet - low sodium heart healthy   Complete by: As directed    Discharge wound care:   Complete by: As directed    Pressure Injury 12/21/21 Toe (Comment  which one) Left Unstageable - Full thickness tissue loss in which the base of the injury is covered by slough (yellow, tan, gray, green or brown) and/or eschar (tan, brown or black) in the wound bed. unstageable pre   Increase activity slowly   Complete by: As directed       Allergies as of 12/24/2021       Reactions   Iodinated Contrast Media Other (See Comments)   Blue, Green and Red dyes    Lorazepam    Altered mental state   Risperidone Other (See Comments)   Altered mental state    Sulfamethoxazole-trimethoprim    Hyper        Medication List     STOP taking these medications    benazepril 10 MG tablet Commonly known as: LOTENSIN       TAKE these medications  albuterol 108 (90 Base) MCG/ACT inhaler Commonly known as: VENTOLIN HFA Inhale 2 puffs into the lungs every 6 (six) hours as needed for wheezing or shortness of breath.   amLODipine 10 MG tablet Commonly known as: NORVASC Take 10 mg by mouth daily with breakfast. for high blood pressure   cholecalciferol 25 MCG (1000 UNIT) tablet Commonly known as: VITAMIN  D3 Take 1,000 Units by mouth daily.   Colace 2-IN-1 8.6-50 MG tablet Generic drug: senna-docusate Take 2 tablets by mouth at bedtime.   docusate sodium 100 MG capsule Commonly known as: COLACE Take 1 capsule (100 mg total) by mouth 2 (two) times daily as needed for mild constipation.   feeding supplement Liqd Take 237 mLs by mouth 3 (three) times daily between meals.   furosemide 20 MG tablet Commonly known as: Lasix Take 1 tablet (20 mg total) by mouth daily as needed for fluid or edema.   gabapentin 300 MG capsule Commonly known as: NEURONTIN Take 2 capsules (600 mg total) by mouth at bedtime. What changed:  how much to take when to take this additional instructions   HYDROcodone-acetaminophen 10-325 MG tablet Commonly known as: NORCO Take 1 tablet by mouth every 6 (six) hours as needed. 6-8 hours as needed.  SNF use only.  Refills per SNF MD/DO. What changed: additional instructions   levothyroxine 100 MCG tablet Commonly known as: SYNTHROID Take 100 mcg by mouth daily.   loperamide 2 MG capsule Commonly known as: IMODIUM Take 2 capsules (4 mg total) by mouth as needed for diarrhea or loose stools.   metoprolol tartrate 25 MG tablet Commonly known as: LOPRESSOR Take 1 tablet (25 mg total) by mouth 2 (two) times daily.   multivitamin with minerals Tabs tablet Take 1 tablet by mouth daily.   pravastatin 20 MG tablet Commonly known as: PRAVACHOL Take 20 mg by mouth at bedtime.   rOPINIRole 0.25 MG tablet Commonly known as: REQUIP Take 0.25 mg by mouth at bedtime.   Tylenol 325 MG tablet Generic drug: acetaminophen Take 650 mg by mouth 3 (three) times daily.               Discharge Care Instructions  (From admission, onward)           Start     Ordered   12/24/21 0000  Discharge wound care:       Comments: Pressure Injury 12/21/21 Toe (Comment  which one) Left Unstageable - Full thickness tissue loss in which the base of the injury is covered  by slough (yellow, tan, gray, green or brown) and/or eschar (tan, brown or black) in the wound bed. unstageable pre   12/24/21 1048            Contact information for after-discharge care     [facility] Preferred SNF .   Service: Skilled Nursing Contact information: [place] [phone]                    Allergies  Allergen Reactions   Iodinated Contrast Media Other (See Comments)    Blue, Green and Red dyes    Lorazepam     Altered mental state   Risperidone Other (See Comments)    Altered mental state    Sulfamethoxazole-Trimethoprim     Hyper    Consultations: NSG   Procedures/Studies: DG Knee 2 Views Right  Result Date:  12/21/2021 CLINICAL DATA:  Right knee pain, fall EXAM: RIGHT KNEE - 1-2 VIEW COMPARISON:  None Available. FINDINGS: Normal alignment. No acute fracture or dislocation. Joint spaces are not optimally profiled but appear preserved. No effusion. Soft tissues are unremarkable. IMPRESSION: 1. Negative. Electronically Signed   By: Fidela Salisbury M.D.   On: 12/21/2021 00:00   DG Lumbar Spine 2-3 Views  Result Date: 12/20/2021 CLINICAL DATA:  Fall, back pain EXAM: LUMBAR SPINE - 2-3 VIEW COMPARISON:  03/24/2020 FINDINGS: Compression deformity of L4 is again identified with 50-60% loss of height. Age indeterminate superior endplate fracture of L3 has developed. No retropulsion. Remaining vertebral body height is preserved. There is diffuse intervertebral disc space narrowing and endplate remodeling throughout the visualized thoracolumbar spine, most severe at T11-L2 in keeping with changes of moderate to severe degenerative disc disease. Vascular calcifications are seen within the abdominal aorta. IMPRESSION: 1. Age indeterminate superior endplate fracture of L3. 2. Stable L4 compression deformity. 3. Moderate to  severe degenerative disc disease. Electronically Signed   By: Fidela Salisbury M.D.   On: 12/20/2021 23:59   DG Hand Complete Right  Result Date: 12/20/2021 CLINICAL DATA:  Fall, right hand pain EXAM: RIGHT HAND - COMPLETE 3+ VIEW COMPARISON:  None Available. FINDINGS: There is an acute, comminuted intra-articular fracture of the base of the right first proximal phalanx with a 1 mm articular gap noted. Extensive soft tissue swelling of the right thumb. No other fracture or dislocation identified. Osseous structures are diffusely osteopenic. Degenerative changes are noted at the base of the thumb and second distal interphalangeal joint. IMPRESSION: 1. Acute, comminuted intra-articular fracture of the base of the right first proximal phalanx. Electronically Signed   By: Fidela Salisbury M.D.   On: 12/20/2021 23:53   DG Chest 1 View  Result Date: 12/20/2021 CLINICAL DATA:  Fall, chest pain EXAM: CHEST  1 VIEW COMPARISON:  10/31/2021 FINDINGS: Large hiatal hernia again identified containing the transverse colon. Lungs are clear. No pneumothorax or pleural effusion. Cardiac size within normal limits. Pulmonary vascularity is normal. Healed right rib fracture noted. No acute bone abnormality. IMPRESSION: 1. No active disease. 2. Large hiatal hernia. Electronically Signed   By: Fidela Salisbury M.D.   On: 12/20/2021 23:36      Subjective: Seen and examined on day of dc.  Stable, no distress. Appropriate for dc to SNF.  Discharge Exam: Vitals:   12/24/21 0608 12/24/21 0825  BP: (!) 100/44 (!) 119/48  Pulse: 70 66  Resp: 16 16  Temp: 98.4 F (36.9 C) 98 F (36.7 C)  SpO2: 97% 97%   Vitals:   12/23/21 0815 12/23/21 2044 12/24/21 0608 12/24/21 0825  BP: (!) 158/68 (!) 126/55 (!) 100/44 (!) 119/48  Pulse: (!) 57 67 70 66  Resp: '17 18 16 16  '$ Temp: 98.1 F (36.7 C) 98.1 F (36.7 C) 98.4 F (36.9 C) 98 F (36.7 C)  TempSrc: Oral Oral Oral   SpO2: 96% 94% 97% 97%  Weight:      Height:         General: Pt is alert, awake, not in acute distress Cardiovascular: RRR, S1/S2 +, no rubs, no gallops Respiratory: CTA bilaterally, no wheezing, no rhonchi Abdominal: Soft, NT, ND, bowel sounds + Extremities: no edema, no cyanosis    The results of significant diagnostics from this hospitalization (including imaging, microbiology, ancillary and laboratory) are listed below for reference.     Microbiology: Recent Results (from the past 240 hour(s))  Blood  culture (routine single)     Status: None (Preliminary result)   Collection Time: 12/20/21 11:15 PM   Specimen: BLOOD  Result Value Ref Range Status   Specimen Description BLOOD BOTTLES DRAWN AEROBIC AND ANAEROBIC  Final   Special Requests BLOOD Blood Culture adequate volume  Final   Culture   Final    NO GROWTH 3 DAYS Performed at [facility], [address] [place], [place] [address]    Report Status PENDING  Incomplete  Urine Culture     Status: Abnormal   Collection Time: 12/20/21 11:15 PM   Specimen: In/Out Cath Urine  Result Value Ref Range Status   Specimen Description   Final    IN/OUT CATH URINE Performed at [facility], [address] [place], [place] [address]    Special Requests   Final    NONE Performed at [facility], [address] [place], [place] [address]    Culture MULTIPLE SPECIES PRESENT, SUGGEST RECOLLECTION (A)  Final   Report Status 12/22/2021 FINAL  Final     Labs: BNP (last 3 results) Recent Labs    05/21/21 1835 08/12/21 1513  BNP 33.7 284.1*   Basic Metabolic Panel: Recent Labs  Lab 12/20/21 2315 12/21/21 0530 12/22/21 0509 12/23/21 0513 12/24/21 0408  NA 135  --  137 134* 137  K 3.7  --  4.2 4.5 4.6  CL 101  --  106 103 107  CO2 26  --  '25 25 25  '$ GLUCOSE 139*  --  105* 98 96  BUN 32*  --  36* 40* 37*  CREATININE 1.62* 1.37* 1.58* 1.77* 1.64*  CALCIUM 8.7*  --  8.4* 8.4* 8.4*  MG 1.4*  --  1.8 1.8 1.8   Liver Function Tests: Recent  Labs  Lab 12/20/21 2315  AST 26  ALT 13  ALKPHOS 88  BILITOT 1.3*  PROT 7.7  ALBUMIN 3.9   No results for input(s): "LIPASE", "AMYLASE" in the last 168 hours. Recent Labs  Lab 12/21/21 0900  AMMONIA <10   CBC: Recent Labs  Lab 12/20/21 2315 12/21/21 0530 12/22/21 0509 12/23/21 0513 12/24/21 0408  WBC 12.6* 11.4* 9.1 8.2 7.6  NEUTROABS 10.1*  --   --   --   --   HGB 11.7* 11.4* 10.0* 10.0* 9.7*  HCT 35.0* 34.3* 29.6* 30.5* 29.5*  MCV 91.6 90.7 91.6 93.0 92.5  PLT 244 246 231 247 272   Cardiac Enzymes: No results for input(s): "CKTOTAL", "CKMB", "CKMBINDEX", "TROPONINI" in the last 168 hours. BNP: Invalid input(s): "POCBNP" CBG: Recent Labs  Lab 12/22/21 1322  GLUCAP 90   D-Dimer No results for input(s): "DDIMER" in the last 72 hours. Hgb A1c No results for input(s): "HGBA1C" in the last 72 hours. Lipid Profile No results for input(s): "CHOL", "HDL", "LDLCALC", "TRIG", "CHOLHDL", "LDLDIRECT" in the last 72 hours. Thyroid function studies No results for input(s): "TSH", "T4TOTAL", "T3FREE", "THYROIDAB" in the last 72 hours.  Invalid input(s): "FREET3" Anemia work up Recent Labs    12/22/21 0509  FOLATE 12.5   Urinalysis    Component Value Date/Time   COLORURINE STRAW (A) 12/20/2021 2315   APPEARANCEUR CLEAR (A) 12/20/2021 2315   APPEARANCEUR Clear 11/21/2012 2330   LABSPEC 1.010 12/20/2021 2315   LABSPEC 1.004 11/21/2012 2330   PHURINE 5.0 12/20/2021 2315   GLUCOSEU NEGATIVE 12/20/2021 2315   GLUCOSEU Negative 11/21/2012 2330   HGBUR SMALL (A) 12/20/2021 [place] NEGATIVE 12/20/2021 2315  BILIRUBINUR Negative 11/21/2012 [place] 12/20/2021 2315   PROTEINUR 30 (A) 12/20/2021 2315   NITRITE NEGATIVE 12/20/2021 2315   LEUKOCYTESUR NEGATIVE 12/20/2021 2315   LEUKOCYTESUR Negative 11/21/2012 2330   Sepsis Labs Recent Labs  Lab 12/21/21 0530 12/22/21 0509 12/23/21 0513 12/24/21 0408  WBC 11.4* 9.1 8.2 7.6    Microbiology Recent Results (from the past 240 hour(s))  Blood culture (routine single)     Status: None (Preliminary result)   Collection Time: 12/20/21 11:15 PM   Specimen: BLOOD  Result Value Ref Range Status   Specimen Description BLOOD BOTTLES DRAWN AEROBIC AND ANAEROBIC  Final   Special Requests BLOOD Blood Culture adequate volume  Final   Culture   Final    NO GROWTH 3 DAYS Performed at [facility], [address] [place], [place] [address]    Report Status PENDING  Incomplete  Urine Culture     Status: Abnormal   Collection Time: 12/20/21 11:15 PM   Specimen: In/Out Cath Urine  Result Value Ref Range Status   Specimen Description   Final    IN/OUT CATH URINE Performed at [facility], [address] [place], [place] [address]    Special Requests   Final    NONE Performed at [facility], [address] [place], [place] [address]    Culture MULTIPLE SPECIES PRESENT, SUGGEST RECOLLECTION (A)  Final   Report Status 12/22/2021 FINAL  Final     Time coordinating discharge: Over 30 minutes  SIGNED:   Sidney Ace, MD  [facility] 12/24/2021, 10:48 AM Pager   If 7PM-7AM, please contact night-coverage

## 2021-12-24 NOTE — Plan of Care (Signed)
IV removed, report called to [facility], family informed that patient will be transported via EMS.

## 2021-12-25 NOTE — Telephone Encounter (Signed)
She is a resident at [facility] from there called and scheduled her appt. Levada Dy is aware of xrays.

## 2021-12-26 LAB — CULTURE, BLOOD (SINGLE)
Culture: NO GROWTH
Special Requests: ADEQUATE

## 2022-01-14 ENCOUNTER — Other Ambulatory Visit: Payer: Self-pay

## 2022-01-14 DIAGNOSIS — S32000D Wedge compression fracture of unspecified lumbar vertebra, subsequent encounter for fracture with routine healing: Secondary | ICD-10-CM

## 2022-01-15 ENCOUNTER — Ambulatory Visit
Admission: RE | Admit: 2022-01-15 | Discharge: 2022-01-15 | Disposition: A | Discharge status: Home / Self Care | Payer: Medicare Other | Attending: Neurosurgery | Admitting: Neurosurgery

## 2022-01-15 ENCOUNTER — Ambulatory Visit (INDEPENDENT_AMBULATORY_CARE_PROVIDER_SITE_OTHER): Payer: Medicare Other | Admitting: Neurosurgery

## 2022-01-15 ENCOUNTER — Encounter: Payer: Self-pay | Admitting: Neurosurgery

## 2022-01-15 ENCOUNTER — Ambulatory Visit
Admission: RE | Admit: 2022-01-15 | Discharge: 2022-01-15 | Disposition: A | Discharge status: Home / Self Care | Payer: Medicare Other | Source: Ambulatory Visit | Attending: Neurosurgery | Admitting: Neurosurgery

## 2022-01-15 VITALS — BP 130/78 | Ht 60.0 in | Wt 125.0 lb

## 2022-01-15 DIAGNOSIS — S32000D Wedge compression fracture of unspecified lumbar vertebra, subsequent encounter for fracture with routine healing: Secondary | ICD-10-CM | POA: Diagnosis present

## 2022-01-15 DIAGNOSIS — M5416 Radiculopathy, lumbar region: Secondary | ICD-10-CM | POA: Diagnosis not present

## 2022-01-15 DIAGNOSIS — W19XXXA Unspecified fall, initial encounter: Secondary | ICD-10-CM

## 2022-01-15 DIAGNOSIS — S32030A Wedge compression fracture of third lumbar vertebra, initial encounter for closed fracture: Secondary | ICD-10-CM | POA: Diagnosis not present

## 2022-01-15 DIAGNOSIS — S32008A Other fracture of unspecified lumbar vertebra, initial encounter for closed fracture: Secondary | ICD-10-CM

## 2022-01-15 NOTE — Progress Notes (Signed)
Follow-up note: Referring Physician:  [facility] [place],  [place] [address]  Primary Physician:  [facility] [place]  Chief Complaint:  ER follow up for lumbar fracture  History of Present Illness: Theresa Snyder is a 88 y.o. female who presents with the chief complaint of persistent low back pain after a fall resulting in an L3 compression fracture.  While her pain is slightly better than it was few weeks ago at her ER visit, she continues to have significant low back pain and new onset of left radiating leg pain.  She is currently living at a facility and states that she has not been using her brace that she cannot get on by herself.  She does state that it does improve her back pain when she does wear it.  She denies any new lower extremity weakness.  Hospital consult note: 12/21/2021 Theresa Snyder is a 88 y.o. female who presents with the chief complaint of a lumbar compression fracture.  She is admitted for leukocytosis, urinary frequency, urgency, dysuria, and possible urinary tract infection with some confusion.  Our consultation was for lumbar compression fracture of the superior endplate of L3.  Her most recent trauma was a fall a few weeks ago when she was going to church.  She states that she fell forward landing on her hands and breaking her thumb.  She has been splinting it herself.   In regards to her low back, she has had at least 10+ years of low back pain and has had a previous severe lumbar compression fracture for which she was told she could wear a brace.  Over the past 10+ years she does intermittently wear her brace as it does help her back feel better.  She has also had a history of sciatica for approximately 10 years as well.  She has not had any procedures for it.  She does still feel the sensation going down her right leg intermittently.  This is no new changes.  She has not had any bowel or  bladder changes.  Not had any new sensation changes to her lower extremity.    Review of Systems:  A 10 point review of systems is negative, and the pertinent positives and negatives detailed in the HPI.  Past Medical History: Past Medical History:  Diagnosis Date   Arthritis    Cancer ([place])    skin   Chronic kidney disease    history of renal insufficiency   History of hiatal hernia    Hypercholesteremia    Hypertension    Hypothyroidism    Lymphedema of leg    Pneumonia    in the past   Seizures ([place]) 2014   no seizures since then   Spinal stenosis     Past Surgical History: Past Surgical History:  Procedure Laterality Date   APPENDECTOMY     CATARACT EXTRACTION W/PHACO Left 09/25/2015   Procedure: CATARACT EXTRACTION PHACO AND INTRAOCULAR LENS PLACEMENT ([place]);  Surgeon: Estill Cotta, MD;  Location: [facility] ORS;  Service: Ophthalmology;  Laterality: Left;  [place] 01:23 AP% 24.0 CDE 34.98 Fluid pack lot # [id number]   CATARACT EXTRACTION W/PHACO Right 07/01/2016   Procedure: CATARACT EXTRACTION PHACO AND INTRAOCULAR LENS PLACEMENT ([facility]);  Surgeon: Estill Cotta, MD;  Location: [facility] ORS;  Service: Ophthalmology;  Laterality: Right;  [place] 1:25.9 AP% 24.8 CDE 40.61 Fluid Pack Lot # [id number]   HIP ARTHROPLASTY Left 07/10/2021   Procedure: ARTHROPLASTY  BIPOLAR HIP (HEMIARTHROPLASTY);  Surgeon: Renee Harder, MD;  Location: [facility] ORS;  Service: Orthopedics;  Laterality: Left;   TUBAL LIGATION      Allergies: Allergies as of 01/15/2022 - Review Complete 12/21/2021  Allergen Reaction Noted   Iodinated contrast media Other (See Comments) 03/04/2015   Lorazepam  03/04/2015   Risperidone Other (See Comments) 03/04/2015   Sulfamethoxazole-trimethoprim  05/28/2015    Medications: Outpatient Encounter Medications as of 01/15/2022  Medication Sig   albuterol (VENTOLIN HFA) 108 (90 Base) MCG/ACT inhaler Inhale 2 puffs into the lungs every 6 (six) hours as needed for wheezing or  shortness of breath.   amLODipine (NORVASC) 10 MG tablet Take 10 mg by mouth daily with breakfast. for high blood pressure   cholecalciferol (VITAMIN D3) 25 MCG (1000 UNIT) tablet Take 1,000 Units by mouth daily.   COLACE 2-IN-1 8.6-50 MG tablet Take 2 tablets by mouth at bedtime.   docusate sodium (COLACE) 100 MG capsule Take 1 capsule (100 mg total) by mouth 2 (two) times daily as needed for mild constipation.   feeding supplement (ENSURE ENLIVE / ENSURE PLUS) LIQD Take 237 mLs by mouth 3 (three) times daily between meals.   furosemide (LASIX) 20 MG tablet Take 1 tablet (20 mg total) by mouth daily as needed for fluid or edema.   gabapentin (NEURONTIN) 300 MG capsule Take 2 capsules (600 mg total) by mouth at bedtime. (Patient taking differently: Take 300 mg by mouth 3 (three) times daily. Take up to 3 times daily.)   HYDROcodone-acetaminophen (NORCO) 10-325 MG tablet Take 1 tablet by mouth every 6 (six) hours as needed. 6-8 hours as needed.  SNF use only.  Refills per SNF MD/DO.   levothyroxine (SYNTHROID) 100 MCG tablet Take 100 mcg by mouth daily.   loperamide (IMODIUM) 2 MG capsule Take 2 capsules (4 mg total) by mouth as needed for diarrhea or loose stools.   metoprolol tartrate (LOPRESSOR) 25 MG tablet Take 1 tablet (25 mg total) by mouth 2 (two) times daily.   Multiple Vitamin (MULTIVITAMIN WITH MINERALS) TABS tablet Take 1 tablet by mouth daily.   pravastatin (PRAVACHOL) 20 MG tablet Take 20 mg by mouth at bedtime.    rOPINIRole (REQUIP) 0.25 MG tablet Take 0.25 mg by mouth at bedtime.   TYLENOL 325 MG tablet Take 650 mg by mouth 3 (three) times daily.   No facility-administered encounter medications on file as of 01/15/2022.    Social History: Social History   Tobacco Use   Smoking status: Never   Smokeless tobacco: Never  Vaping Use   Vaping Use: Never used  Substance Use Topics   Alcohol use: No    Alcohol/week: 0.0 standard drinks of alcohol   Drug use: No    Family  Medical History: No family history on file.  Exam:  Today's Vitals   01/15/22 1443  BP: 130/78  Weight: 56.7 kg  Height: 5' (1.524 m)  PainSc: 10-Worst pain ever  PainLoc: Back   Body mass index is 24.41 kg/m.   General: A&O.  ROM of spine: limited.  Palpation of spine: TTP throughout.  Strength in the left lower extremity is EHL 5/5, Dorsiflexion 5/5, Plantar flexion 5/5, Hamstring 5/5, Quadricep 5/5, Iliopsoas 5/5. Strength in the right lower extremity is EHL 5/5, Dorsiflexion 5/5, Plantar flexion 5/5, Hamstring 5/5, Quadricep 5/5, Iliopsoas 5/5. Reflexes are 1+ and symmetric at the patella and achilles.   Bilateral lower extremity sensation is intact to light touch.  Toes down-going.  Gait  untested.  Imaging: 01/15/22 xrays Shows worsening of L3 compression fracture and stable L4 fracture  Assessment and Plan: Ms. Idleman is a pleasant 88 y.o. female with longstanding history of lumbosacral complaints and known lumbar stenosis with a new L3 compression fracture worsened from her hospital imaging.  She continues to have low back pain and has a new onset of left radiating leg pain.  She is interested in pursuing treatment options if at all possible.  I do not think she would be an ideal surgical candidate, she may be able to pursue more conservative options such as a kyphoplasty or epidural steroid injections.  I will obtain an MRI to further evaluate for this.  I like to see her back in 4 weeks with repeat lumbar x-rays.   In the meantime I recommended that she continue to wear her LSO brace when she is upright and ambulating however she will need assistance donning and doffing her brace at her facility. She was encouraged to call the office in the interim with any questions or concerns.  I spent a total of 30 minutes in both face-to-face and non-face-to-face activities for this visit on the date of this encounter including records, review of imaging, review of symptoms, physical exam,  documentation, order placement.  Cooper Render PA-C Neurosurgery

## 2022-01-26 ENCOUNTER — Ambulatory Visit
Admission: RE | Admit: 2022-01-26 | Discharge: 2022-01-26 | Disposition: A | Discharge status: Home / Self Care | Payer: Medicare Other | Source: Ambulatory Visit | Attending: Neurosurgery | Admitting: Neurosurgery

## 2022-01-26 DIAGNOSIS — S32008A Other fracture of unspecified lumbar vertebra, initial encounter for closed fracture: Secondary | ICD-10-CM | POA: Insufficient documentation

## 2022-01-26 DIAGNOSIS — S34109A Unspecified injury to unspecified level of lumbar spinal cord, initial encounter: Secondary | ICD-10-CM | POA: Diagnosis present

## 2022-01-26 DIAGNOSIS — M5416 Radiculopathy, lumbar region: Secondary | ICD-10-CM | POA: Insufficient documentation

## 2022-02-12 ENCOUNTER — Ambulatory Visit: Payer: Medicare Other | Admitting: Neurosurgery

## 2022-02-24 ENCOUNTER — Ambulatory Visit: Payer: Medicare Other | Admitting: Neurosurgery

## 2022-03-02 ENCOUNTER — Other Ambulatory Visit: Payer: Self-pay

## 2022-03-02 DIAGNOSIS — S32000D Wedge compression fracture of unspecified lumbar vertebra, subsequent encounter for fracture with routine healing: Secondary | ICD-10-CM

## 2022-03-03 ENCOUNTER — Ambulatory Visit
Admission: RE | Admit: 2022-03-03 | Discharge: 2022-03-03 | Disposition: A | Discharge status: Home / Self Care | Payer: Medicare Other | Attending: Neurosurgery | Admitting: Neurosurgery

## 2022-03-03 ENCOUNTER — Ambulatory Visit
Admission: RE | Admit: 2022-03-03 | Discharge: 2022-03-03 | Disposition: A | Discharge status: Home / Self Care | Payer: Medicare Other | Source: Ambulatory Visit | Attending: Neurosurgery | Admitting: Neurosurgery

## 2022-03-03 ENCOUNTER — Ambulatory Visit (INDEPENDENT_AMBULATORY_CARE_PROVIDER_SITE_OTHER): Payer: Medicare Other | Admitting: Neurosurgery

## 2022-03-03 DIAGNOSIS — M5416 Radiculopathy, lumbar region: Secondary | ICD-10-CM | POA: Diagnosis not present

## 2022-03-03 DIAGNOSIS — S32000D Wedge compression fracture of unspecified lumbar vertebra, subsequent encounter for fracture with routine healing: Secondary | ICD-10-CM | POA: Diagnosis not present

## 2022-03-03 DIAGNOSIS — M48062 Spinal stenosis, lumbar region with neurogenic claudication: Secondary | ICD-10-CM

## 2022-03-03 NOTE — Progress Notes (Signed)
Follow-up note: Referring Physician:  [facility], [place],  [place] [id number]  Primary Physician:  [facility], [place]  Chief Complaint:  ER follow up for lumbar fracture  History of Present Illness: 03/03/22 Ms. BAUCOM is a 89 y.o female presenting today for 4 week follow up.  She continues to have significant back pain and right radiating leg pain.  She has periodically been wearing her back brace.  She states that the pain is interfering significantly with her quality of life and her ability to do daily activities.   01/15/22 Leane L Mayhall is a 89 y.o. female who presents with the chief complaint of persistent low back pain after a fall resulting in an L3 compression fracture.  While her pain is slightly better than it was few weeks ago at her ER visit, she continues to have significant low back pain and new onset of left radiating leg pain.  She is currently living at a facility and states that she has not been using her brace that she cannot get on by herself.  She does state that it does improve her back pain when she does wear it.  She denies any new lower extremity weakness.  Hospital consult note: 12/21/2021 Tambria Pfannenstiel Lalley is a 89 y.o. female who presents with the chief complaint of a lumbar compression fracture.  She is admitted for leukocytosis, urinary frequency, urgency, dysuria, and possible urinary tract infection with some confusion.  Our consultation was for lumbar compression fracture of the superior endplate of L3.  Her most recent trauma was a fall a few weeks ago when she was going to church.  She states that she fell forward landing on her hands and breaking her thumb.  She has been splinting it herself.   In regards to her low back, she has had at least 10+ years of low back pain and has had a previous severe lumbar compression fracture for which she was told she could wear a brace.  Over the past  10+ years she does intermittently wear her brace as it does help her back feel better.  She has also had a history of sciatica for approximately 10 years as well.  She has not had any procedures for it.  She does still feel the sensation going down her right leg intermittently.  This is no new changes.  She has not had any bowel or bladder changes.  Not had any new sensation changes to her lower extremity.    Review of Systems:  A 10 point review of systems is negative, and the pertinent positives and negatives detailed in the HPI.  Past Medical History: Past Medical History:  Diagnosis Date   Arthritis    Cancer ([place])    skin   Chronic kidney disease    history of renal insufficiency   History of hiatal hernia    Hypercholesteremia    Hypertension    Hypothyroidism    Lymphedema of leg    Pneumonia    in the past   Seizures ([place]) 2014   no seizures since then   Spinal stenosis     Past Surgical History: Past Surgical History:  Procedure Laterality Date   APPENDECTOMY     CATARACT EXTRACTION W/PHACO Left 09/25/2015   Procedure: CATARACT EXTRACTION PHACO AND INTRAOCULAR LENS PLACEMENT ([place]);  Surgeon: Estill Cotta, MD;  Location: [facility] ORS;  Service: Ophthalmology;  Laterality: Left;  [place] 01:23 AP% 24.0 CDE  34.98 Fluid pack lot # [id number]   CATARACT EXTRACTION W/PHACO Right 07/01/2016   Procedure: CATARACT EXTRACTION PHACO AND INTRAOCULAR LENS PLACEMENT ([facility]);  Surgeon: Estill Cotta, MD;  Location: [facility] ORS;  Service: Ophthalmology;  Laterality: Right;  [place] 1:25.9 AP% 24.8 CDE 40.61 Fluid Pack Lot # [id number]   HIP ARTHROPLASTY Left 07/10/2021   Procedure: ARTHROPLASTY BIPOLAR HIP (HEMIARTHROPLASTY);  Surgeon: Renee Harder, MD;  Location: [facility] ORS;  Service: Orthopedics;  Laterality: Left;   TUBAL LIGATION      Allergies: Allergies as of 03/03/2022 - Review Complete 01/15/2022  Allergen Reaction Noted   Iodinated contrast media Other (See Comments) 03/04/2015    Lorazepam  03/04/2015   Risperidone Other (See Comments) 03/04/2015   Sulfamethoxazole-trimethoprim  05/28/2015    Medications: Outpatient Encounter Medications as of 03/03/2022  Medication Sig   albuterol (VENTOLIN HFA) 108 (90 Base) MCG/ACT inhaler Inhale 2 puffs into the lungs every 6 (six) hours as needed for wheezing or shortness of breath.   amLODipine (NORVASC) 10 MG tablet Take 10 mg by mouth daily with breakfast. for high blood pressure   cholecalciferol (VITAMIN D3) 25 MCG (1000 UNIT) tablet Take 1,000 Units by mouth daily.   diclofenac Sodium (VOLTAREN) 1 % GEL Apply 2 g topically 4 (four) times daily.   docusate sodium (COLACE) 100 MG capsule Take 1 capsule (100 mg total) by mouth 2 (two) times daily as needed for mild constipation.   feeding supplement (ENSURE ENLIVE / ENSURE PLUS) LIQD Take 237 mLs by mouth 3 (three) times daily between meals.   furosemide (LASIX) 20 MG tablet Take 1 tablet (20 mg total) by mouth daily as needed for fluid or edema.   gabapentin (NEURONTIN) 300 MG capsule Take 2 capsules (600 mg total) by mouth at bedtime. (Patient taking differently: Take 300 mg by mouth 3 (three) times daily. Take up to 3 times daily.)   HYDROcodone-acetaminophen (NORCO) 10-325 MG tablet Take 1 tablet by mouth every 6 (six) hours as needed. 6-8 hours as needed.  SNF use only.  Refills per SNF MD/DO.   levothyroxine (SYNTHROID) 100 MCG tablet Take 100 mcg by mouth daily.   loperamide (IMODIUM) 2 MG capsule Take 2 capsules (4 mg total) by mouth as needed for diarrhea or loose stools.   Melatonin 3 MG CAPS Take 3 mg by mouth daily.   metoprolol tartrate (LOPRESSOR) 25 MG tablet Take 1 tablet (25 mg total) by mouth 2 (two) times daily.   Multiple Vitamin (MULTIVITAMIN WITH MINERALS) TABS tablet Take 1 tablet by mouth daily.   pravastatin (PRAVACHOL) 20 MG tablet Take 20 mg by mouth at bedtime.    rOPINIRole (REQUIP) 0.25 MG tablet Take 0.25 mg by mouth at bedtime.   TYLENOL 325 MG  tablet Take 650 mg by mouth 3 (three) times daily.   No facility-administered encounter medications on file as of 03/03/2022.    Social History: Social History   Tobacco Use   Smoking status: Never   Smokeless tobacco: Never  Vaping Use   Vaping Use: Never used  Substance Use Topics   Alcohol use: No    Alcohol/week: 0.0 standard drinks of alcohol   Drug use: No    Family Medical History: No family history on file.  Exam:  There were no vitals filed for this visit.  There is no height or weight on file to calculate BMI.   General: A&O.  ROM of spine: limited.  Palpation of spine: TTP throughout.  Strength in the left lower  extremity is EHL 5/5, Dorsiflexion 5/5, Plantar flexion 5/5, Hamstring 5/5, Quadricep 5/5, Iliopsoas 5/5. Strength in the right lower extremity is EHL 5/5, Dorsiflexion 5/5, Plantar flexion 5/5, Hamstring 5/5, Quadricep 5/5, Iliopsoas 5/5. Reflexes are 1+ and symmetric at the patella and achilles.   Bilateral lower extremity sensation is intact to light touch.  Toes down-going.  Gait untested.  Imaging: 01/26/22 MRI L spine IMPRESSION: 1. Acute or subacute compression fracture at L3 with loss of height of 60%. Posterior bowing of the posterosuperior margin of the vertebral body encroaches upon the spinal canal. 2. Old healed compression fracture at L4. 3. Severe multifactorial spinal stenosis at L2-3, L3-4 and L4-5 that could cause neural compression on either or both sides. 4. Lesser, grossly non-compressive degenerative changes at T11-12, L1-2 and L5-S1.     Electronically Signed   By: Nelson Chimes M.D.   On: 01/27/2022 15:43  01/15/22 xrays Shows worsening of L3 compression fracture and stable L4 fracture  Assessment and Plan: Ms. Duda is a pleasant 89 y.o. female with longstanding history of lumbosacral complaints and known lumbar stenosis and L3 compression fracture which appears stable on xrays today.  Given her continued symptoms and her  findings on lumbar MRI, I do feel like it would be worthwhile for her to consider injections.  I would like to get her into see Dr. Holley Raring for consideration of ESI's.  I will see her back in about 12 weeks to evaluate her progress.  She was encouraged to call the office in the interim should she have any questions or concerns.  She expressed understanding was in agreement with this plan.  I spent a total of 30 minutes in both face-to-face and non-face-to-face activities for this visit on the date of this encounter including review of imaging, review of symptoms, physical exam, documentation, order placement.  Cooper Render PA-C Neurosurgery

## 2022-03-04 NOTE — Addendum Note (Signed)
Addended by: Herb Grays on: 03/04/2022 09:44 AM   Modules accepted: Orders

## 2022-04-16 ENCOUNTER — Ambulatory Visit: Payer: Medicare Other | Admitting: Student in an Organized Health Care Education/Training Program

## 2022-05-07 ENCOUNTER — Ambulatory Visit: Payer: Medicare Other | Admitting: Student in an Organized Health Care Education/Training Program

## 2022-05-12 ENCOUNTER — Ambulatory Visit: Payer: Self-pay | Admitting: Neurosurgery

## 2022-06-09 ENCOUNTER — Ambulatory Visit: Payer: Medicare Other | Admitting: Student in an Organized Health Care Education/Training Program

## 2022-06-21 ENCOUNTER — Encounter: Payer: Self-pay | Admitting: Internal Medicine

## 2022-06-21 ENCOUNTER — Emergency Department: Payer: Medicare (Managed Care)

## 2022-06-21 ENCOUNTER — Inpatient Hospital Stay
Admission: EM | Admit: 2022-06-21 | Discharge: 2022-06-26 | DRG: 543 | Disposition: A | Discharge status: Skilled Nursing Facility | Payer: Medicare (Managed Care) | Source: Skilled Nursing Facility | Attending: Internal Medicine | Admitting: Internal Medicine

## 2022-06-21 ENCOUNTER — Observation Stay: Payer: Medicare (Managed Care)

## 2022-06-21 ENCOUNTER — Other Ambulatory Visit: Payer: Self-pay

## 2022-06-21 DIAGNOSIS — S32000D Wedge compression fracture of unspecified lumbar vertebra, subsequent encounter for fracture with routine healing: Secondary | ICD-10-CM

## 2022-06-21 DIAGNOSIS — Z85828 Personal history of other malignant neoplasm of skin: Secondary | ICD-10-CM

## 2022-06-21 DIAGNOSIS — I130 Hypertensive heart and chronic kidney disease with heart failure and stage 1 through stage 4 chronic kidney disease, or unspecified chronic kidney disease: Secondary | ICD-10-CM | POA: Diagnosis present

## 2022-06-21 DIAGNOSIS — E039 Hypothyroidism, unspecified: Secondary | ICD-10-CM | POA: Diagnosis present

## 2022-06-21 DIAGNOSIS — E878 Other disorders of electrolyte and fluid balance, not elsewhere classified: Secondary | ICD-10-CM | POA: Diagnosis present

## 2022-06-21 DIAGNOSIS — Z91041 Radiographic dye allergy status: Secondary | ICD-10-CM

## 2022-06-21 DIAGNOSIS — E876 Hypokalemia: Secondary | ICD-10-CM | POA: Diagnosis not present

## 2022-06-21 DIAGNOSIS — R0602 Shortness of breath: Secondary | ICD-10-CM | POA: Diagnosis not present

## 2022-06-21 DIAGNOSIS — Z888 Allergy status to other drugs, medicaments and biological substances status: Secondary | ICD-10-CM

## 2022-06-21 DIAGNOSIS — M8008XA Age-related osteoporosis with current pathological fracture, vertebra(e), initial encounter for fracture: Principal | ICD-10-CM | POA: Diagnosis present

## 2022-06-21 DIAGNOSIS — K449 Diaphragmatic hernia without obstruction or gangrene: Secondary | ICD-10-CM | POA: Diagnosis present

## 2022-06-21 DIAGNOSIS — N1832 Chronic kidney disease, stage 3b: Secondary | ICD-10-CM | POA: Diagnosis present

## 2022-06-21 DIAGNOSIS — Z8249 Family history of ischemic heart disease and other diseases of the circulatory system: Secondary | ICD-10-CM

## 2022-06-21 DIAGNOSIS — Z8701 Personal history of pneumonia (recurrent): Secondary | ICD-10-CM

## 2022-06-21 DIAGNOSIS — N189 Chronic kidney disease, unspecified: Secondary | ICD-10-CM | POA: Diagnosis present

## 2022-06-21 DIAGNOSIS — R350 Frequency of micturition: Secondary | ICD-10-CM | POA: Diagnosis not present

## 2022-06-21 DIAGNOSIS — I5032 Chronic diastolic (congestive) heart failure: Secondary | ICD-10-CM | POA: Diagnosis present

## 2022-06-21 DIAGNOSIS — Z882 Allergy status to sulfonamides status: Secondary | ICD-10-CM

## 2022-06-21 DIAGNOSIS — M25552 Pain in left hip: Secondary | ICD-10-CM | POA: Diagnosis present

## 2022-06-21 DIAGNOSIS — E7800 Pure hypercholesterolemia, unspecified: Secondary | ICD-10-CM | POA: Diagnosis present

## 2022-06-21 DIAGNOSIS — E871 Hypo-osmolality and hyponatremia: Secondary | ICD-10-CM | POA: Diagnosis present

## 2022-06-21 DIAGNOSIS — Z5309 Procedure and treatment not carried out because of other contraindication: Secondary | ICD-10-CM | POA: Diagnosis not present

## 2022-06-21 DIAGNOSIS — S22050A Wedge compression fracture of T5-T6 vertebra, initial encounter for closed fracture: Secondary | ICD-10-CM

## 2022-06-21 DIAGNOSIS — E861 Hypovolemia: Secondary | ICD-10-CM | POA: Diagnosis present

## 2022-06-21 DIAGNOSIS — Z79890 Hormone replacement therapy (postmenopausal): Secondary | ICD-10-CM

## 2022-06-21 DIAGNOSIS — Z79899 Other long term (current) drug therapy: Secondary | ICD-10-CM

## 2022-06-21 DIAGNOSIS — R110 Nausea: Secondary | ICD-10-CM | POA: Diagnosis present

## 2022-06-21 DIAGNOSIS — K219 Gastro-esophageal reflux disease without esophagitis: Secondary | ICD-10-CM | POA: Diagnosis present

## 2022-06-21 DIAGNOSIS — F05 Delirium due to known physiological condition: Secondary | ICD-10-CM | POA: Diagnosis not present

## 2022-06-21 DIAGNOSIS — Z87310 Personal history of (healed) osteoporosis fracture: Secondary | ICD-10-CM

## 2022-06-21 DIAGNOSIS — Z823 Family history of stroke: Secondary | ICD-10-CM

## 2022-06-21 DIAGNOSIS — R519 Headache, unspecified: Secondary | ICD-10-CM

## 2022-06-21 DIAGNOSIS — M546 Pain in thoracic spine: Secondary | ICD-10-CM | POA: Diagnosis present

## 2022-06-21 DIAGNOSIS — Z9102 Food additives allergy status: Secondary | ICD-10-CM

## 2022-06-21 DIAGNOSIS — F0390 Unspecified dementia without behavioral disturbance: Secondary | ICD-10-CM | POA: Insufficient documentation

## 2022-06-21 DIAGNOSIS — F0280 Dementia in other diseases classified elsewhere without behavioral disturbance: Secondary | ICD-10-CM | POA: Diagnosis present

## 2022-06-21 DIAGNOSIS — Z96642 Presence of left artificial hip joint: Secondary | ICD-10-CM | POA: Diagnosis present

## 2022-06-21 LAB — URINALYSIS, W/ REFLEX TO CULTURE (INFECTION SUSPECTED)
Bacteria, UA: NONE SEEN
Bilirubin Urine: NEGATIVE
Glucose, UA: NEGATIVE mg/dL
Hgb urine dipstick: NEGATIVE
Ketones, ur: NEGATIVE mg/dL
Leukocytes,Ua: NEGATIVE
Nitrite: NEGATIVE
Protein, ur: 100 mg/dL — AB
Specific Gravity, Urine: 1.02 (ref 1.005–1.030)
pH: 6 (ref 5.0–8.0)

## 2022-06-21 LAB — BASIC METABOLIC PANEL
Anion gap: 12 (ref 5–15)
BUN: 27 mg/dL — ABNORMAL HIGH (ref 8–23)
CO2: 25 mmol/L (ref 22–32)
Calcium: 9.3 mg/dL (ref 8.9–10.3)
Chloride: 83 mmol/L — ABNORMAL LOW (ref 98–111)
Creatinine, Ser: 1.19 mg/dL — ABNORMAL HIGH (ref 0.44–1.00)
GFR, Estimated: 43 mL/min — ABNORMAL LOW (ref >60)
Glucose, Bld: 123 mg/dL — ABNORMAL HIGH (ref 70–99)
Potassium: 4.5 mmol/L (ref 3.5–5.1)
Sodium: 120 mmol/L — ABNORMAL LOW (ref 135–145)

## 2022-06-21 LAB — TSH: TSH: 2.233 u[IU]/mL (ref 0.350–4.500)

## 2022-06-21 LAB — CBC
HCT: 36.4 % (ref 36.0–46.0)
Hemoglobin: 12.5 g/dL (ref 12.0–15.0)
MCH: 30.7 pg (ref 26.0–34.0)
MCHC: 34.3 g/dL (ref 30.0–36.0)
MCV: 89.4 fL (ref 80.0–100.0)
Platelets: 341 10*3/uL (ref 150–400)
RBC: 4.07 MIL/uL (ref 3.87–5.11)
RDW: 11.7 % (ref 11.5–15.5)
WBC: 10.9 10*3/uL — ABNORMAL HIGH (ref 4.0–10.5)
nRBC: 0 % (ref 0.0–0.2)

## 2022-06-21 LAB — TROPONIN I (HIGH SENSITIVITY): Troponin I (High Sensitivity): 6 ng/L (ref <18)

## 2022-06-21 LAB — D-DIMER, QUANTITATIVE: D-Dimer, Quant: 1.53 ug/mL-FEU — ABNORMAL HIGH (ref 0.00–0.50)

## 2022-06-21 MED ORDER — ACETAMINOPHEN 500 MG PO TABS
1000.0000 mg | ORAL_TABLET | Freq: Once | ORAL | Status: AC
  Administered 2022-06-21: 1000 mg via ORAL
  Filled 2022-06-21: qty 2

## 2022-06-21 MED ORDER — SODIUM CHLORIDE 0.9% FLUSH
3.0000 mL | Freq: Two times a day (BID) | INTRAVENOUS | Status: DC
  Administered 2022-06-21 – 2022-06-26 (×9): 3 mL via INTRAVENOUS

## 2022-06-21 MED ORDER — METOPROLOL TARTRATE 25 MG PO TABS
25.0000 mg | ORAL_TABLET | Freq: Two times a day (BID) | ORAL | Status: DC
  Administered 2022-06-22 – 2022-06-26 (×9): 25 mg via ORAL
  Filled 2022-06-21 (×9): qty 1

## 2022-06-21 MED ORDER — LEVOTHYROXINE SODIUM 50 MCG PO TABS
100.0000 ug | ORAL_TABLET | Freq: Every day | ORAL | Status: DC
  Administered 2022-06-22 – 2022-06-26 (×5): 100 ug via ORAL
  Filled 2022-06-21 (×5): qty 2

## 2022-06-21 MED ORDER — ALBUTEROL SULFATE (2.5 MG/3ML) 0.083% IN NEBU: 2.5000 mg | INHALATION_SOLUTION | Freq: Four times a day (QID) | RESPIRATORY_TRACT | Status: DC | PRN

## 2022-06-21 MED ORDER — SODIUM CHLORIDE 0.9 % IV SOLN: INTRAVENOUS | Status: DC

## 2022-06-21 MED ORDER — ACETAMINOPHEN 325 MG PO TABS
650.0000 mg | ORAL_TABLET | Freq: Four times a day (QID) | ORAL | Status: DC | PRN
  Administered 2022-06-22 – 2022-06-24 (×3): 650 mg via ORAL
  Filled 2022-06-21 (×4): qty 2

## 2022-06-21 MED ORDER — SODIUM CHLORIDE 0.9 % IV BOLUS
1000.0000 mL | Freq: Once | INTRAVENOUS | Status: AC
  Administered 2022-06-21: 1000 mL via INTRAVENOUS

## 2022-06-21 MED ORDER — HEPARIN SODIUM (PORCINE) 5000 UNIT/ML IJ SOLN
5000.0000 [IU] | Freq: Two times a day (BID) | INTRAMUSCULAR | Status: DC
  Administered 2022-06-21 – 2022-06-26 (×7): 5000 [IU] via SUBCUTANEOUS
  Filled 2022-06-21 (×8): qty 1

## 2022-06-21 MED ORDER — ACETAMINOPHEN 650 MG RE SUPP: 650.0000 mg | Freq: Four times a day (QID) | RECTAL | Status: DC | PRN

## 2022-06-21 MED ORDER — MORPHINE SULFATE (PF) 2 MG/ML IV SOLN
2.0000 mg | INTRAVENOUS | Status: DC | PRN
  Administered 2022-06-22: 2 mg via INTRAVENOUS
  Filled 2022-06-21: qty 1

## 2022-06-21 MED ORDER — ONDANSETRON 4 MG PO TBDP
4.0000 mg | ORAL_TABLET | Freq: Once | ORAL | Status: AC | PRN
  Administered 2022-06-21: 4 mg via ORAL
  Filled 2022-06-21: qty 1

## 2022-06-21 MED ORDER — ALBUTEROL SULFATE HFA 108 (90 BASE) MCG/ACT IN AERS: 2.0000 | INHALATION_SPRAY | Freq: Four times a day (QID) | RESPIRATORY_TRACT | Status: DC | PRN

## 2022-06-21 MED ORDER — PROCHLORPERAZINE EDISYLATE 10 MG/2ML IJ SOLN
10.0000 mg | Freq: Once | INTRAMUSCULAR | Status: AC
  Administered 2022-06-21: 10 mg via INTRAVENOUS
  Filled 2022-06-21: qty 2

## 2022-06-21 MED ORDER — ONDANSETRON HCL 4 MG/2ML IJ SOLN
4.0000 mg | Freq: Once | INTRAMUSCULAR | Status: AC
  Administered 2022-06-21: 4 mg via INTRAVENOUS
  Filled 2022-06-21: qty 2

## 2022-06-21 NOTE — Assessment & Plan Note (Signed)
Tsh is 2.233. We will continue levothyroxine at 100 mcg.

## 2022-06-21 NOTE — ED Triage Notes (Signed)
First nurse note: Patient arrived by [facility] [employer] with c/o SOB ongoing since Thursday. Diagnosed with pneumonia Thursday. Also reports nausea   Ambulatory with assistance. A&O x4  EMS administered: 4mg  zofran right deltoid IM  EMS vitals: 151/66 96% RA 57HR

## 2022-06-21 NOTE — H&P (Signed)
History and Physical     Patient: Theresa Snyder ZOX:[id number] DOB: 1929-07-01 DOA: 06/21/2022 DOS: the patient was seen and examined on 06/21/2022 PCP: [facility]  Cardiology: Dr. Gwen Pounds.  Patient coming from:  [facility].  Chief Complaint: SOB.  HISTORY OF PRESENT ILLNESS: Theresa Snyder is an 89 y.o. female seen in ed for SOB since Thursday when she was diagnosed with PNA. SOB since Thursday , and nausea , I don't see where pt was treated for PNA. Pt is alert awake and oriented But is not able to give  additional detail.Pt does not report any headaches blurred vision.Chart review shows h/o seizure 2023 note says pt was on keppra but not currently seen in chart.    Past Medical History:  Diagnosis Date   Arthritis    Cancer (HCC)    skin   Chronic kidney disease    history of renal insufficiency   History of hiatal hernia    Hypercholesteremia    Hypertension    Hypothyroidism    Lymphedema of leg    Pneumonia    in the past   Seizures (HCC) 2014   no seizures since then   Spinal stenosis    Review of Systems  Respiratory:  Positive for shortness of breath.   Gastrointestinal:  Positive for nausea.  Neurological:  Positive for weakness.  All other systems reviewed and are negative.  Allergies  Allergen Reactions   Iodinated Contrast Media Other (See Comments)    Blue, Green and Red dyes    Lorazepam     Altered mental state   Risperidone Other (See Comments)    Altered mental state    Sulfamethoxazole-Trimethoprim     Hyper   Past Surgical History:  Procedure Laterality Date   APPENDECTOMY     CATARACT EXTRACTION W/PHACO Left 09/25/2015   Procedure: CATARACT EXTRACTION PHACO AND INTRAOCULAR LENS PLACEMENT (IOC);  Surgeon: Sallee Lange, MD;  Location: [facility] ORS;  Service: Ophthalmology;  Laterality: Left;  [place] 01:23 AP% 24.0 CDE 34.98 Fluid pack lot # [id number]   CATARACT EXTRACTION W/PHACO Right 07/01/2016    Procedure: CATARACT EXTRACTION PHACO AND INTRAOCULAR LENS PLACEMENT (IOC);  Surgeon: Sallee Lange, MD;  Location: [facility] ORS;  Service: Ophthalmology;  Laterality: Right;  [place] 1:25.9 AP% 24.8 CDE 40.61 Fluid Pack Lot # [id number]   HIP ARTHROPLASTY Left 07/10/2021   Procedure: ARTHROPLASTY BIPOLAR HIP (HEMIARTHROPLASTY);  Surgeon: Ross Marcus, MD;  Location: [facility] ORS;  Service: Orthopedics;  Laterality: Left;   TUBAL LIGATION     MEDICATIONS: Prior to Admission medications   Medication Sig Start Date End Date Taking? Authorizing Provider  albuterol (VENTOLIN HFA) 108 (90 Base) MCG/ACT inhaler Inhale 2 puffs into the lungs every 6 (six) hours as needed for wheezing or shortness of breath. 08/15/21   Pokhrel, Rebekah Chesterfield, MD  amLODipine (NORVASC) 10 MG tablet Take 10 mg by mouth daily with breakfast. for high blood pressure 01/12/15   [provider]  cholecalciferol (VITAMIN D3) 25 MCG (1000 UNIT) tablet Take 1,000 Units by mouth daily. 12/19/21   [provider]  diclofenac Sodium (VOLTAREN) 1 % GEL Apply 2 g topically 4 (four) times daily.    [provider]  docusate sodium (COLACE) 100 MG capsule Take 1 capsule (100 mg total) by mouth 2 (two) times daily as needed for mild constipation. 05/24/21   Spongberg, Susy Frizzle, MD  furosemide (LASIX) 20 MG tablet Take 1 tablet (20 mg total) by mouth daily as  needed for fluid or edema. 11/01/21 11/01/22  Arnetha Courser, MD  gabapentin (NEURONTIN) 300 MG capsule Take 2 capsules (600 mg total) by mouth at bedtime. Patient taking differently: Take 300 mg by mouth 3 (three) times daily. Take up to 3 times daily. 07/14/21   Swayze, Ava, DO  HYDROcodone-acetaminophen (NORCO) 10-325 MG tablet Take 1 tablet by mouth every 6 (six) hours as needed. 6-8 hours as needed.  SNF use only.  Refills per SNF MD/DO. 12/24/21   Tresa Moore, MD  levothyroxine (SYNTHROID) 100 MCG tablet Take 100 mcg by mouth daily. 12/19/21   [provider]  loperamide (IMODIUM) 2 MG capsule Take 2 capsules (4 mg total) by mouth as needed for diarrhea or loose stools. 12/24/21   Tresa Moore, MD  Melatonin 3 MG CAPS Take 3 mg by mouth daily.    [provider]  metoprolol tartrate (LOPRESSOR) 25 MG tablet Take 1 tablet (25 mg total) by mouth 2 (two) times daily. 07/14/21   Swayze, Ava, DO  Multiple Vitamin (MULTIVITAMIN WITH MINERALS) TABS tablet Take 1 tablet by mouth daily. 07/15/21   Swayze, Ava, DO  pravastatin (PRAVACHOL) 20 MG tablet Take 20 mg by mouth at bedtime.     [provider]  rOPINIRole (REQUIP) 0.25 MG tablet Take 0.25 mg by mouth at bedtime. 12/20/21   [provider]  senna-docusate (SENOKOT-S) 8.6-50 MG tablet Take 1 tablet by mouth daily.    [provider]  TYLENOL 325 MG tablet Take 650 mg by mouth 3 (three) times daily. 08/06/21   [provider]    heparin  5,000 Units Subcutaneous Q12H   [START ON 06/22/2022] levothyroxine  100 mcg Oral Q0600   [START ON 06/22/2022] metoprolol tartrate  25 mg Oral BID   sodium chloride flush  3 mL Intravenous Q12H    sodium chloride 75 mL/hr at 06/21/22 2013  ED Course: Pt in Ed is AXO. Afebrile  Vitals:   06/21/22 1800 06/21/22 1805 06/21/22 2100 06/21/22 2217  BP:  (!) 135/59 (!) 121/59 139/64  Pulse: 65 (!) 57 69 69  Resp: 20 14 18  (!) 21  Temp:   98.2 F (36.8 C) (!) 97.5 F (36.4 C)  TempSrc:   Oral   SpO2: 100% 100% 99% 97%  Weight:      EKG shows sinus bradycardia and no st t wave changes.   Total I/O In: 1000 [IV Piggyback:1000] Out: -  SpO2: 97 % Blood work in ed shows: Hyponatremia of 120 and CKD with creatinine of 1.19.  During exam pt reports left hip pain.  Results for orders placed or performed during the hospital encounter of 06/21/22 (from the past 72 hour(s))  Basic metabolic panel     Status: Abnormal   Collection Time: 06/21/22  3:42 PM  Result Value Ref Range   Sodium 120 (L) 135 - 145  mmol/L   Potassium 4.5 3.5 - 5.1 mmol/L   Chloride 83 (L) 98 - 111 mmol/L   CO2 25 22 - 32 mmol/L   Glucose, Bld 123 (H) 70 - 99 mg/dL    Comment: Glucose reference range applies only to samples taken after fasting for at least 8 hours.   BUN 27 (H) 8 - 23 mg/dL   Creatinine, Ser 9.60 (H) 0.44 - 1.00 mg/dL   Calcium 9.3 8.9 - 45.4 mg/dL   GFR, Estimated 43 (L) >60 mL/min    Comment: (NOTE) Calculated using the CKD-EPI Creatinine Equation (2021)  Anion gap 12 5 - 15    Comment: Performed at [facility], [address] [place], [place] [address]  CBC     Status: Abnormal   Collection Time: 06/21/22  3:42 PM  Result Value Ref Range   WBC 10.9 (H) 4.0 - 10.5 K/uL   RBC 4.07 3.87 - 5.11 MIL/uL   Hemoglobin 12.5 12.0 - 15.0 g/dL   HCT 60.4 54.0 - 98.1 %   MCV 89.4 80.0 - 100.0 fL   MCH 30.7 26.0 - 34.0 pg   MCHC 34.3 30.0 - 36.0 g/dL   RDW 19.1 47.8 - 29.5 %   Platelets 341 150 - 400 K/uL   nRBC 0.0 0.0 - 0.2 %    Comment: Performed at [facility], [address] [place], [place] [address]  Troponin I (High Sensitivity)     Status: None   Collection Time: 06/21/22  3:42 PM  Result Value Ref Range   Troponin I (High Sensitivity) 6 <18 ng/L    Comment: (NOTE) Elevated high sensitivity troponin I (hsTnI) values and significant  changes across serial measurements may suggest ACS but many other  chronic and acute conditions are known to elevate hsTnI results.  Refer to the "Links" section for chest pain algorithms and additional  guidance. Performed at [facility], [address] [place], [place] [address]   TSH     Status: None   Collection Time: 06/21/22  4:31 PM  Result Value Ref Range   TSH 2.233 0.350 - 4.500 uIU/mL    Comment: Performed by a 3rd Generation assay with a functional sensitivity of <=0.01 uIU/mL. Performed at [facility], [address] [place], [place] [address]   Urinalysis, w/ Reflex to Culture (Infection  Suspected) -Urine, Clean Catch     Status: Abnormal   Collection Time: 06/21/22  6:00 PM  Result Value Ref Range   Specimen Source URINE, CLEAN CATCH    Color, Urine YELLOW YELLOW   APPearance CLEAR CLEAR   Specific Gravity, Urine 1.020 1.005 - 1.030   pH 6.0 5.0 - 8.0   Glucose, UA NEGATIVE NEGATIVE mg/dL   Hgb urine dipstick NEGATIVE NEGATIVE   Bilirubin Urine NEGATIVE NEGATIVE   Ketones, ur NEGATIVE NEGATIVE mg/dL   Protein, ur 952 (A) NEGATIVE mg/dL   Nitrite NEGATIVE NEGATIVE   Leukocytes,Ua NEGATIVE NEGATIVE   Squamous Epithelial / HPF 0-5 0 - 5 /HPF   WBC, UA 0-5 0 - 5 WBC/hpf    Comment: Reflex urine culture not performed if WBC <=10, OR if Squamous epithelial cells >5. If Squamous epithelial cells >5, suggest recollection.   RBC / HPF 0-5 0 - 5 RBC/hpf   Bacteria, UA NONE SEEN NONE SEEN    Comment: Performed at [facility], [address] [place], [place] [address]    Lab Results  Component Value Date   CREATININE 1.19 (H) 06/21/2022   CREATININE 1.64 (H) 12/24/2021   CREATININE 1.77 (H) 12/23/2021      Latest Ref Rng & Units 06/21/2022    3:42 PM 12/24/2021    4:08 AM 12/23/2021    5:13 AM  CMP  Glucose 70 - 99 mg/dL 440  96  98   BUN 8 - 23 mg/dL 27  37  40   Creatinine 0.44 - 1.00 mg/dL 1.02  7.25  3.66   Sodium 135 - 145 mmol/L 120  137  134   Potassium 3.5 - 5.1 mmol/L 4.5  4.6  4.5   Chloride 98 - 111 mmol/L 83  107  103   CO2 22 - 32 mmol/L 25  25  25    Calcium 8.9 - 10.3 mg/dL 9.3  8.4  8.4    Unresulted Labs (From admission, onward)     Start     Ordered   06/22/22 0500  Comprehensive metabolic panel  Tomorrow morning,   R        06/21/22 1957   06/22/22 0500  CBC  Tomorrow morning,   R        06/21/22 1957   06/21/22 1957  T4, free  Once,   R        06/21/22 1957           Pt has received : Orders Placed This Encounter  Procedures   Critical Care    This order was created via procedure documentation    Standing Status:    Standing    Number of Occurrences:   1   DG Chest 2 View    Standing Status:   Standing    Number of Occurrences:   1    Order Specific Question:   Reason for Exam (SYMPTOM  OR DIAGNOSIS REQUIRED)    Answer:   cp and sob   CT Head Wo Contrast    Standing Status:   Standing    Number of Occurrences:   1   Basic metabolic panel    Standing Status:   Standing    Number of Occurrences:   1   CBC    Standing Status:   Standing    Number of Occurrences:   1   Urinalysis, w/ Reflex to Culture (Infection Suspected) -Urine, Clean Catch    Standing Status:   Standing    Number of Occurrences:   1    Order Specific Question:   Specimen Source    Answer:   Urine, Clean Catch [76]   TSH    Standing Status:   Standing    Number of Occurrences:   1   Comprehensive metabolic panel    Standing Status:   Standing    Number of Occurrences:   1   CBC    Standing Status:   Standing    Number of Occurrences:   1   T4, free    Standing Status:   Standing    Number of Occurrences:   1   Document Height and Actual Weight    Use scales to weigh patient, not stated or estimated weight.    Standing Status:   Standing    Number of Occurrences:   1   Orthostatic vital signs    Standing Status:   Standing    Number of Occurrences:   1   Maintain IV access    Standing Status:   Standing    Number of Occurrences:   1   Vital signs    Standing Status:   Standing    Number of Occurrences:   1   Notify physician (specify)    Standing Status:   Standing    Number of Occurrences:   20    Order Specific Question:   Notify Physician    Answer:   for pulse less than 55 or greater than 120    Order Specific Question:   Notify Physician    Answer:   for respiratory rate less than 12 or greater than 25    Order Specific Question:  Notify Physician    Answer:   for temperature greater than 100.5 F    Order Specific Question:   Notify Physician    Answer:   for urinary output less than 30 mL/hr for four  hours    Order Specific Question:   Notify Physician    Answer:   for systolic BP less than 90 or greater than 160, diastolic BP less than 60 or greater than 100    Order Specific Question:   Notify Physician    Answer:   for new hypoxia w/ oxygen saturations < 88%   Progressive Mobility Protocol: No Restrictions    Standing Status:   Standing    Number of Occurrences:   1   Daily weights    Standing Status:   Standing    Number of Occurrences:   1   Intake and Output    Standing Status:   Standing    Number of Occurrences:   1   Do not place and if present remove PureWick    Standing Status:   Standing    Number of Occurrences:   1   Initiate Oral Care Protocol    Standing Status:   Standing    Number of Occurrences:   1   Initiate Carrier Fluid Protocol    Standing Status:   Standing    Number of Occurrences:   1   RN may order General Admission PRN Orders utilizing "General Admission PRN medications" (through manage orders) for the following patient needs: allergy symptoms (Claritin), cold sores (Carmex), cough (Robitussin DM), eye irritation (Liquifilm Tears), hemorrhoids (Tucks), indigestion (Maalox), minor skin irritation (Hydrocortisone Cream), muscle pain (Ben Gay), nose irritation (saline nasal spray) and sore throat (Chloraseptic spray).    Standing Status:   Standing    Number of Occurrences:   [id number]   Cardiac Monitoring Continuous x 48 hours Indications for use: Other; Other indications for use: SOB/PNA/ electrolyte monitoring.    Standing Status:   Standing    Number of Occurrences:   1    Order Specific Question:   Indications for use:    Answer:   Other    Order Specific Question:   Other indications for use:    Answer:   SOB/PNA/ electrolyte monitoring.   Orthostatic vital signs on admission    Standing Status:   Standing    Number of Occurrences:   1   Swallow screen    Standing Status:   Standing    Number of Occurrences:   1   Full code    Standing Status:    Standing    Number of Occurrences:   1    Order Specific Question:   By:    Answer:   Other   Consult to hospitalist    Standing Status:   Standing    Number of Occurrences:   1    Order Specific Question:   Place call to:    Answer:   [id number]    Order Specific Question:   Reason for Consult    Answer:   Admit   PT eval and treat    Standing Status:   Standing    Number of Occurrences:   1   Pulse oximetry check with vital signs    Standing Status:   Standing    Number of Occurrences:   1   Oxygen therapy Mode or (Route): Nasal cannula; Liters Per Minute: 2; Keep 02 saturation: greater than 92 %  Standing Status:   Standing    Number of Occurrences:   20    Order Specific Question:   Mode or (Route)    Answer:   Nasal cannula    Order Specific Question:   Liters Per Minute    Answer:   2    Order Specific Question:   Keep 02 saturation    Answer:   greater than 92 %   CBG monitoring, ED    Standing Status:   Standing    Number of Occurrences:   1   EKG 12-Lead    Standing Status:   Standing    Number of Occurrences:   1   ED EKG    Altered mental status    Standing Status:   Standing    Number of Occurrences:   1    Order Specific Question:   Reason for Exam    Answer:   Other (See Comments)   Place in observation (patient's expected length of stay will be less than 2 midnights)    Standing Status:   Standing    Number of Occurrences:   1    Order Specific Question:   Hospital Area    Answer:   [facility] [[id number]]    Order Specific Question:   Level of Care    Answer:   Telemetry Medical [104]    Order Specific Question:   Covid Evaluation    Answer:   Asymptomatic - no recent exposure (last 10 days) testing not required    Order Specific Question:   Diagnosis    Answer:   SOB (shortness of breath) [161096]    Order Specific Question:   Admitting Physician    Answer:   Darrold Junker    Order Specific Question:   Attending Physician     Answer:   Darrold Junker   Aspiration precautions    Standing Status:   Standing    Number of Occurrences:   1   Fall precautions    Standing Status:   Standing    Number of Occurrences:   1    Meds ordered this encounter  Medications   ondansetron (ZOFRAN-ODT) disintegrating tablet 4 mg   sodium chloride 0.9 % bolus 1,000 mL   ondansetron (ZOFRAN) injection 4 mg   prochlorperazine (COMPAZINE) injection 10 mg   acetaminophen (TYLENOL) tablet 1,000 mg   metoprolol tartrate (LOPRESSOR) tablet 25 mg   levothyroxine (SYNTHROID) tablet 100 mcg   DISCONTD: albuterol (VENTOLIN HFA) 108 (90 Base) MCG/ACT inhaler 2 puff   heparin injection 5,000 Units   sodium chloride flush (NS) 0.9 % injection 3 mL   0.9 %  sodium chloride infusion   OR Linked Order Group    acetaminophen (TYLENOL) tablet 650 mg    acetaminophen (TYLENOL) suppository 650 mg   morphine (PF) 2 MG/ML injection 2 mg   albuterol (PROVENTIL) (2.5 MG/3ML) 0.083% nebulizer solution 2.5 mg    Admission Imaging : CT Head Wo Contrast  Result Date: 06/21/2022 CLINICAL DATA:  Headaches and recent pneumonia diagnosis, initial encounter EXAM: CT HEAD WITHOUT CONTRAST TECHNIQUE: Contiguous axial images were obtained from the base of the skull through the vertex without intravenous contrast. RADIATION DOSE REDUCTION: This exam was performed according to the departmental dose-optimization program which includes automated exposure control, adjustment of the mA and/or kV according to patient size and/or use of iterative reconstruction technique. COMPARISON:  10/31/2021 FINDINGS: Brain: No evidence of acute infarction, hemorrhage,  hydrocephalus, extra-axial collection or mass lesion/mass effect. Mild atrophic changes are noted. Chronic white matter ischemic changes are seen. Vascular: No hyperdense vessel or unexpected calcification. Skull: Normal. Negative for fracture or focal lesion. Sinuses/Orbits: No acute finding. Other: None.  IMPRESSION: Chronic atrophic and ischemic changes are noted. No acute abnormality noted. Electronically Signed   By: Alcide Clever M.D.   On: 06/21/2022 18:33   DG Chest 2 View  Result Date: 06/21/2022 CLINICAL DATA:  Chest pain with shortness of breath EXAM: CHEST - 2 VIEW COMPARISON:  Chest x-ray 12/20/2021 FINDINGS: Large hiatal hernia is again seen. The cardiomediastinal silhouette is otherwise within normal limits. There is no definite focal lung infiltrate, pleural effusion or pneumothorax. No acute fractures are seen. There are chronic right rib fractures. IMPRESSION: 1. No acute cardiopulmonary process. 2. Large hiatal hernia. Electronically Signed   By: Darliss Cheney M.D.   On: 06/21/2022 17:34   Physical Examination: Vitals:   06/21/22 1800 06/21/22 1805 06/21/22 2100 06/21/22 2217  BP:  (!) 135/59 (!) 121/59 139/64  Pulse: 65 (!) 57 69 69  Temp:   98.2 F (36.8 C) (!) 97.5 F (36.4 C)  Resp: 20 14 18  (!) 21  Weight:      SpO2: 100% 100% 99% 97%  TempSrc:   Oral    Physical Exam Vitals and nursing note reviewed.  Constitutional:      General: She is not in acute distress.    Appearance: Normal appearance. She is not ill-appearing, toxic-appearing or diaphoretic.  HENT:     Head: Normocephalic and atraumatic.     Right Ear: Hearing and external ear normal.     Left Ear: Hearing and external ear normal.     Nose: Nose normal. No nasal deformity.     Mouth/Throat:     Lips: Pink.     Mouth: Mucous membranes are dry.     Tongue: No lesions.     Pharynx: Oropharynx is clear.  Eyes:     Extraocular Movements: Extraocular movements intact.     Pupils: Pupils are equal, round, and reactive to light.  Cardiovascular:     Rate and Rhythm: Regular rhythm. Tachycardia present.     Pulses: Normal pulses.     Heart sounds: Normal heart sounds.  Pulmonary:     Effort: Pulmonary effort is normal.     Breath sounds: Normal breath sounds.  Abdominal:     General: Bowel sounds are  normal. There is no distension.     Palpations: Abdomen is soft. There is no mass.     Tenderness: There is no abdominal tenderness. There is no guarding.     Hernia: No hernia is present.  Musculoskeletal:     Right lower leg: No edema.     Left lower leg: No edema.  Skin:    General: Skin is warm.  Neurological:     General: No focal deficit present.     Mental Status: She is alert and oriented to person, place, and time.     Cranial Nerves: Cranial nerves 2-12 are intact.     Motor: Motor function is intact.  Psychiatric:        Attention and Perception: Attention normal.        Mood and Affect: Mood normal.        Speech: Speech normal.        Behavior: Behavior normal. Behavior is cooperative.        Cognition and Memory: Cognition normal.  Assessment and Plan: * SOB (shortness of breath) Chest xray negative for any acute finding. We will follow o2 sats.    Hyponatremia Start pt on NS at 75 cc/hour.  Asymptomatic. 2/2 to hypovolemic  hyponatremia 2/2 to decreased po intake.  Hypothyroidism Tsh is 2.233. We will continue levothyroxine at 100 mcg.   Left hip pain We will xray and check dimer. Dvt risk is moderate due to age and mobility limitation.  Nausea IV PRN zofran.  IV PPI.    Chronic diastolic CHF (congestive heart failure) (HCC) Stable compensated. Cont metoprolol. Pt sees Dr. Gwen Pounds.   Chronic kidney disease Lab Results  Component Value Date   CREATININE 1.19 (H) 06/21/2022   CREATININE 1.64 (H) 12/24/2021   CREATININE 1.77 (H) 12/23/2021  Stable. Renally dose and avoid contrast.    DVT prophylaxis:  Heparin.  Code Status:  Full code.     06/21/2022   10:21 PM  Advanced Directives  Does Patient Have a Medical Advance Directive? Yes  Type of Estate agent of [place];Living will   Family Communication:  None. Emergency Contact: Contact Information     Name Relation Home Work [place] J  Daughter [phone]  [phone]   Theresa Snyder, Theresa Snyder   [phone]       Disposition Plan:  SNF.  Consults: None. Admission status: Observation.   Unit / Expected LOS: Med tele/ 2 days.   Theresa Calkin MD [facility]  6 PM- 2 AM. [phone]( Pager )  For questions regarding this patient please use [email or web address] to contact the current [facility] MD.   Theresa Snyder may also call [phone] to contact current Assigned [facility] Attending/Consulting MD for this patient.

## 2022-06-21 NOTE — Assessment & Plan Note (Addendum)
X-ray negative

## 2022-06-21 NOTE — ED Provider Notes (Addendum)
[facility] Provider Note    Event Date/Time   First MD Initiated Contact with Patient 06/21/22 1748     (approximate)   History   Dizziness, Shortness of Breath, and Nausea   HPI  Theresa Snyder is a 89 y.o. female past med history significant for CKD, hyperlipidemia, hypertension, hypothyroidism, who presents to the emergency department with dizziness and nausea.  Patient arrives from [facility] with complaints of shortness of breath that has been ongoing since Thursday.  According to first nurse note patient was diagnosed with pneumonia on Thursday.  Patient complaining of dizziness, shortness of breath and nausea for the past 3 days.  Complaining of a headache.  Denies any change in vision.  Endorses nausea but no episodes of vomiting.  No diarrhea or constipation.  Endorses decreased urine output.  States that she has not been eating much over the past couple of days.  No history of headaches.  No recent falls or head trauma.     Physical Exam   Triage Vital Signs: ED Triage Vitals  Enc Vitals Group     BP 06/21/22 1535 (!) 153/77     Pulse Rate 06/21/22 1535 61     Resp 06/21/22 1535 (!) 22     Temp 06/21/22 1537 98.1 F (36.7 C)     Temp Source 06/21/22 1537 Oral     SpO2 06/21/22 1535 93 %     Weight 06/21/22 1531 125 lb (56.7 kg)     Height --      Head Circumference --      Peak Flow --      Pain Score 06/21/22 1531 0     Pain Loc --      Pain Edu? --      Excl. in GC? --     Most recent vital signs: Vitals:   06/21/22 1800 06/21/22 1805  BP:  (!) 135/59  Pulse: 65 (!) 57  Resp: 20 14  Temp:    SpO2: 100% 100%    Physical Exam Constitutional:      Appearance: She is well-developed.  HENT:     Head: Atraumatic.  Eyes:     Conjunctiva/sclera: Conjunctivae normal.  Cardiovascular:     Rate and Rhythm: Regular rhythm.  Pulmonary:     Effort: No respiratory distress.     Breath sounds: No wheezing.  Abdominal:      General: There is no distension.  Musculoskeletal:        General: Normal range of motion.     Cervical back: Normal range of motion.     Right lower leg: No edema.     Left lower leg: No edema.  Skin:    General: Skin is warm.  Neurological:     Mental Status: She is alert. Mental status is at baseline.     GCS: GCS eye subscore is 4. GCS verbal subscore is 5. GCS motor subscore is 6.     Cranial Nerves: Cranial nerves 2-12 are intact.     Sensory: Sensation is intact.     Motor: Motor function is intact.     Coordination: Coordination is intact.     IMPRESSION / MDM / ASSESSMENT AND PLAN / ED COURSE  I reviewed the triage vital signs and the nursing notes.  Differential diagnosis including dehydration, electrolyte abnormality, intracranial hemorrhage, pneumonia, ACS, CVA  Patient had lab work done in triage.  Came back with a sodium level of 120.  Clinically appears to be dehydrated, most likely hypovolemic hyponatremia.  Patient denies any new medications.  EKG  I, Corena Herter, the attending physician, personally viewed and interpreted this ECG.  Significant underlying artifact.  Sinus bradycardia.  No significant ST elevation or depression.  No signs of acute ischemia or dysrhythmia.  Normal intervals.  No chamber enlargement.  No tachycardic or bradycardic dysrhythmias while on cardiac telemetry.  RADIOLOGY I independently reviewed imaging, my interpretation of imaging: Chest x-ray with no findings of pneumonia.  Questionable findings to the left lower lung fields.  Read as no acute cardiopulmonary process and large hiatal hernia.  LABS (all labs ordered are listed, but only abnormal results are displayed) Labs interpreted as -    Labs Reviewed  BASIC METABOLIC PANEL - Abnormal; Notable for the following components:      Result Value   Sodium 120 (*)    Chloride 83 (*)    Glucose, Bld 123 (*)    BUN 27 (*)    Creatinine, Ser 1.19 (*)    GFR, Estimated 43 (*)     All other components within normal limits  CBC - Abnormal; Notable for the following components:   WBC 10.9 (*)    All other components within normal limits  URINALYSIS, W/ REFLEX TO CULTURE (INFECTION SUSPECTED) - Abnormal; Notable for the following components:   Protein, ur 100 (*)    All other components within normal limits  TSH  CBG MONITORING, ED  TROPONIN I (HIGH SENSITIVITY)     MDM    Patient with significant hyponatremia and on chart review has a sodium level closer to normal at 130s.  Appears hypovolemic.  Given IV fluids.  Urine without signs of urinary tract infection.  CT scan of her head without signs of intracranial hemorrhage or infarction.  Patient given IV fluids and IV antiemetics x 2  On reevaluation patient continues to complain of a significant headache with nausea.  Will treat the patient for headache with IV Compazine and Tylenol.  Consulted hospitalist for admission for hyponatremia.   PROCEDURES:  Critical Care performed: yes .Critical Care  Performed by: Corena Herter, MD Authorized by: Corena Herter, MD   Critical care provider statement:    Critical care time (minutes):  30   Critical care time was exclusive of:  Separately billable procedures and treating other patients   Critical care was necessary to treat or prevent imminent or life-threatening deterioration of the following conditions:  Metabolic crisis   Critical care was time spent personally by me on the following activities:  Development of treatment plan with patient or surrogate, discussions with consultants, evaluation of patient's response to treatment, examination of patient, ordering and review of laboratory studies, ordering and review of radiographic studies, ordering and performing treatments and interventions, pulse oximetry, re-evaluation of patient's condition and review of old charts   Patient's presentation is most consistent with acute presentation with potential threat  to life or bodily function.   MEDICATIONS ORDERED IN ED: Medications  prochlorperazine (COMPAZINE) injection 10 mg (has no administration in time range)  acetaminophen (TYLENOL) tablet 1,000 mg (has no administration in time range)  ondansetron (ZOFRAN-ODT) disintegrating tablet 4 mg (4 mg Oral Given 06/21/22 1634)  sodium chloride 0.9 % bolus 1,000 mL (1,000 mLs Intravenous New Bag/Given 06/21/22 1808)  ondansetron (ZOFRAN) injection 4 mg (4 mg Intravenous Given 06/21/22 1809)    FINAL CLINICAL IMPRESSION(S) / ED DIAGNOSES   Final diagnoses:  Hyponatremia  Acute intractable headache, unspecified  headache type     Rx / DC Orders   ED Discharge Orders     None        Note:  This document was prepared using Dragon voice recognition software and may include unintentional dictation errors.   Corena Herter, MD 06/21/22 Nobie Putnam    Corena Herter, MD 06/21/22 1910

## 2022-06-21 NOTE — ED Notes (Addendum)
Pt to CT via stretcher, IVF bolus infusing

## 2022-06-21 NOTE — ED Triage Notes (Signed)
Dizziness, SOB and nausea x 3 days. Denies pain.

## 2022-06-21 NOTE — Assessment & Plan Note (Signed)
Start pt on NS at 75 cc/hour.  Asymptomatic. 2/2 to hypovolemic  hyponatremia 2/2 to decreased po intake.

## 2022-06-21 NOTE — Assessment & Plan Note (Signed)
Lab Results  Component Value Date   CREATININE 1.19 (H) 06/21/2022   CREATININE 1.64 (H) 12/24/2021   CREATININE 1.77 (H) 12/23/2021  Stable. Renally dose and avoid contrast.

## 2022-06-21 NOTE — Plan of Care (Signed)

## 2022-06-21 NOTE — Assessment & Plan Note (Signed)
Stable compensated. Cont metoprolol. Pt sees Dr. Gwen Pounds.

## 2022-06-21 NOTE — Hospital Course (Signed)
Pt does not know what happened just reports nausea and ros is otherwise negative.

## 2022-06-21 NOTE — ED Notes (Signed)
EDP at [facility]. Pt c/o HA and back pain, also nausea. A&O with some confusion. Alert, NAD, calm, interactive, resps e/u, speaking in clear complete sentences.

## 2022-06-22 ENCOUNTER — Inpatient Hospital Stay: Payer: Medicare (Managed Care)

## 2022-06-22 ENCOUNTER — Observation Stay: Payer: Medicare (Managed Care)

## 2022-06-22 DIAGNOSIS — M25552 Pain in left hip: Secondary | ICD-10-CM

## 2022-06-22 DIAGNOSIS — E871 Hypo-osmolality and hyponatremia: Secondary | ICD-10-CM | POA: Diagnosis present

## 2022-06-22 DIAGNOSIS — E878 Other disorders of electrolyte and fluid balance, not elsewhere classified: Secondary | ICD-10-CM | POA: Diagnosis present

## 2022-06-22 DIAGNOSIS — F0390 Unspecified dementia without behavioral disturbance: Secondary | ICD-10-CM | POA: Diagnosis not present

## 2022-06-22 DIAGNOSIS — E034 Atrophy of thyroid (acquired): Secondary | ICD-10-CM | POA: Diagnosis not present

## 2022-06-22 DIAGNOSIS — I130 Hypertensive heart and chronic kidney disease with heart failure and stage 1 through stage 4 chronic kidney disease, or unspecified chronic kidney disease: Secondary | ICD-10-CM | POA: Diagnosis present

## 2022-06-22 DIAGNOSIS — Z85828 Personal history of other malignant neoplasm of skin: Secondary | ICD-10-CM | POA: Diagnosis not present

## 2022-06-22 DIAGNOSIS — I5032 Chronic diastolic (congestive) heart failure: Secondary | ICD-10-CM | POA: Diagnosis present

## 2022-06-22 DIAGNOSIS — Z79899 Other long term (current) drug therapy: Secondary | ICD-10-CM | POA: Diagnosis not present

## 2022-06-22 DIAGNOSIS — K449 Diaphragmatic hernia without obstruction or gangrene: Secondary | ICD-10-CM | POA: Diagnosis present

## 2022-06-22 DIAGNOSIS — Z96642 Presence of left artificial hip joint: Secondary | ICD-10-CM | POA: Diagnosis present

## 2022-06-22 DIAGNOSIS — F0280 Dementia in other diseases classified elsewhere without behavioral disturbance: Secondary | ICD-10-CM | POA: Diagnosis present

## 2022-06-22 DIAGNOSIS — K219 Gastro-esophageal reflux disease without esophagitis: Secondary | ICD-10-CM | POA: Diagnosis present

## 2022-06-22 DIAGNOSIS — M8008XA Age-related osteoporosis with current pathological fracture, vertebra(e), initial encounter for fracture: Secondary | ICD-10-CM | POA: Diagnosis present

## 2022-06-22 DIAGNOSIS — E039 Hypothyroidism, unspecified: Secondary | ICD-10-CM

## 2022-06-22 DIAGNOSIS — M546 Pain in thoracic spine: Secondary | ICD-10-CM

## 2022-06-22 DIAGNOSIS — N1831 Chronic kidney disease, stage 3a: Secondary | ICD-10-CM

## 2022-06-22 DIAGNOSIS — Z8249 Family history of ischemic heart disease and other diseases of the circulatory system: Secondary | ICD-10-CM | POA: Diagnosis not present

## 2022-06-22 DIAGNOSIS — E7800 Pure hypercholesterolemia, unspecified: Secondary | ICD-10-CM | POA: Diagnosis present

## 2022-06-22 DIAGNOSIS — S22050A Wedge compression fracture of T5-T6 vertebra, initial encounter for closed fracture: Secondary | ICD-10-CM | POA: Diagnosis not present

## 2022-06-22 DIAGNOSIS — E876 Hypokalemia: Secondary | ICD-10-CM | POA: Diagnosis not present

## 2022-06-22 DIAGNOSIS — Z5309 Procedure and treatment not carried out because of other contraindication: Secondary | ICD-10-CM | POA: Diagnosis not present

## 2022-06-22 DIAGNOSIS — Z79890 Hormone replacement therapy (postmenopausal): Secondary | ICD-10-CM | POA: Diagnosis not present

## 2022-06-22 DIAGNOSIS — E861 Hypovolemia: Secondary | ICD-10-CM | POA: Diagnosis present

## 2022-06-22 DIAGNOSIS — R350 Frequency of micturition: Secondary | ICD-10-CM | POA: Diagnosis not present

## 2022-06-22 DIAGNOSIS — N1832 Chronic kidney disease, stage 3b: Secondary | ICD-10-CM | POA: Diagnosis present

## 2022-06-22 DIAGNOSIS — Z8701 Personal history of pneumonia (recurrent): Secondary | ICD-10-CM | POA: Diagnosis not present

## 2022-06-22 DIAGNOSIS — F05 Delirium due to known physiological condition: Secondary | ICD-10-CM | POA: Diagnosis not present

## 2022-06-22 LAB — COMPREHENSIVE METABOLIC PANEL
ALT: 17 U/L (ref 0–44)
AST: 30 U/L (ref 15–41)
Albumin: 3.8 g/dL (ref 3.5–5.0)
Alkaline Phosphatase: 97 U/L (ref 38–126)
Anion gap: 11 (ref 5–15)
BUN: 26 mg/dL — ABNORMAL HIGH (ref 8–23)
CO2: 23 mmol/L (ref 22–32)
Calcium: 8.9 mg/dL (ref 8.9–10.3)
Chloride: 92 mmol/L — ABNORMAL LOW (ref 98–111)
Creatinine, Ser: 1.13 mg/dL — ABNORMAL HIGH (ref 0.44–1.00)
GFR, Estimated: 46 mL/min — ABNORMAL LOW (ref >60)
Glucose, Bld: 100 mg/dL — ABNORMAL HIGH (ref 70–99)
Potassium: 4.1 mmol/L (ref 3.5–5.1)
Sodium: 125 mmol/L — ABNORMAL LOW (ref 135–145)
Total Bilirubin: 0.6 mg/dL (ref 0.3–1.2)
Total Protein: 6.8 g/dL (ref 6.5–8.1)

## 2022-06-22 LAB — CBC
HCT: 33.5 % — ABNORMAL LOW (ref 36.0–46.0)
Hemoglobin: 11.6 g/dL — ABNORMAL LOW (ref 12.0–15.0)
MCH: 30.4 pg (ref 26.0–34.0)
MCHC: 34.6 g/dL (ref 30.0–36.0)
MCV: 87.7 fL (ref 80.0–100.0)
Platelets: 306 10*3/uL (ref 150–400)
RBC: 3.82 MIL/uL — ABNORMAL LOW (ref 3.87–5.11)
RDW: 11.8 % (ref 11.5–15.5)
WBC: 11 10*3/uL — ABNORMAL HIGH (ref 4.0–10.5)
nRBC: 0 % (ref 0.0–0.2)

## 2022-06-22 LAB — T4, FREE: Free T4: 1.75 ng/dL — ABNORMAL HIGH (ref 0.61–1.12)

## 2022-06-22 MED ORDER — SODIUM CHLORIDE 0.9 % IV SOLN: INTRAVENOUS | Status: DC

## 2022-06-22 MED ORDER — OXYCODONE HCL 5 MG PO TABS: 5.0000 mg | ORAL_TABLET | ORAL | Status: DC | PRN

## 2022-06-22 MED ORDER — TRAZODONE HCL 50 MG PO TABS
25.0000 mg | ORAL_TABLET | Freq: Every evening | ORAL | Status: DC | PRN
  Administered 2022-06-22 – 2022-06-23 (×2): 25 mg via ORAL
  Filled 2022-06-22 (×3): qty 1

## 2022-06-22 MED ORDER — AMLODIPINE BESYLATE 10 MG PO TABS
10.0000 mg | ORAL_TABLET | Freq: Every day | ORAL | Status: DC
  Administered 2022-06-23 – 2022-06-26 (×4): 10 mg via ORAL
  Filled 2022-06-22 (×4): qty 1

## 2022-06-22 MED ORDER — OXYCODONE HCL 5 MG PO TABS
5.0000 mg | ORAL_TABLET | ORAL | Status: DC | PRN
  Administered 2022-06-22: 5 mg via ORAL
  Filled 2022-06-22 (×3): qty 1

## 2022-06-22 MED ORDER — PANTOPRAZOLE SODIUM 40 MG PO TBEC
40.0000 mg | DELAYED_RELEASE_TABLET | Freq: Every day | ORAL | Status: DC
  Administered 2022-06-22 – 2022-06-26 (×5): 40 mg via ORAL
  Filled 2022-06-22 (×5): qty 1

## 2022-06-22 MED ORDER — GABAPENTIN 300 MG PO CAPS
600.0000 mg | ORAL_CAPSULE | Freq: Every day | ORAL | Status: DC
  Administered 2022-06-22 – 2022-06-25 (×4): 600 mg via ORAL
  Filled 2022-06-22 (×4): qty 2

## 2022-06-22 MED ORDER — MORPHINE SULFATE (PF) 2 MG/ML IV SOLN: 1.0000 mg | INTRAVENOUS | Status: DC | PRN

## 2022-06-22 MED ORDER — CALCITONIN (SALMON) 200 UNIT/ACT NA SOLN
1.0000 | Freq: Every day | NASAL | Status: DC
  Administered 2022-06-23 – 2022-06-26 (×4): 1 via NASAL
  Filled 2022-06-22 (×2): qty 3.7

## 2022-06-22 MED ORDER — OXYCODONE HCL 5 MG PO TABS
5.0000 mg | ORAL_TABLET | ORAL | Status: DC | PRN
  Administered 2022-06-22 – 2022-06-23 (×4): 5 mg via ORAL
  Filled 2022-06-22 (×5): qty 1

## 2022-06-22 NOTE — Evaluation (Signed)
Physical Therapy Evaluation Patient Details Name: Theresa Snyder MRN: [id number] DOB: 11/27/1929 Today's Date: 06/22/2022  History of Present Illness  Pt admitted for complaints of SOB symptoms. History includes HLD, HTN, and seizures.  Clinical Impression  Pt is a pleasant 89 year old female who was admitted for SOB symptoms. Pt performs transfers and ambulation with cga and RW. Further ambulation limited by back pain at this time. Pt demonstrates deficits with strength/mobility/endurance. All mobility performed on RA with sats WNL  Pt appears grossly at baseline level and reports she only ambulates in her room at [facility] at baseline. Would benefit from skilled PT to address above deficits and promote optimal return to PLOF. Pt will continue to receive skilled PT services while admitted and will defer to TOC/care team for updates regarding disposition planning.       Recommendations for follow up therapy are one component of a multi-disciplinary discharge planning process, led by the attending physician.  Recommendations may be updated based on patient status, additional functional criteria and insurance authorization.  Follow Up Recommendations       Assistance Recommended at Discharge Intermittent Supervision/Assistance  Patient can return home with the following  A little help with walking and/or transfers;A little help with bathing/dressing/bathroom;Assist for transportation    Equipment Recommendations None recommended by PT  Recommendations for Other Services       Functional Status Assessment Patient has had a recent decline in their functional status and demonstrates the ability to make significant improvements in function in a reasonable and predictable amount of time.     Precautions / Restrictions Precautions Precautions: Fall Restrictions Weight Bearing Restrictions: No      Mobility  Bed Mobility               General bed mobility comments:  received on BSC upon arrival    Transfers Overall transfer level: Needs assistance Equipment used: Rolling walker (2 wheels) Transfers: Sit to/from Stand Sit to Stand: Min guard           General transfer comment: safe technique with upright posture. RW used    Ambulation/Gait Ambulation/Gait assistance: Land (Feet): 5 Feet Assistive device: Rolling walker (2 wheels) Gait Pattern/deviations: Step-to pattern       General Gait Details: ambulated from [facility] to recliner. Cues for sequencing. Request to defer further ambulation due to back pain. Reports she only ambulates room distances at baseline  Stairs            Wheelchair Mobility    Modified Rankin (Stroke Patients Only)       Balance Overall balance assessment: Needs assistance Sitting-balance support: Feet supported Sitting balance-Leahy Scale: Good     Standing balance support: Bilateral upper extremity supported Standing balance-Leahy Scale: Good                               Pertinent Vitals/Pain Pain Assessment Pain Assessment: Faces Faces Pain Scale: Hurts even more Pain Location: back Pain Descriptors / Indicators: Aching Pain Intervention(s): Limited activity within patient's tolerance, Repositioned    Home Living Family/patient expects to be discharged to:: Assisted living                   Additional Comments: reports she is living at [facility] for a few months now.    Prior Function Prior Level of Function : Needs assist  Mobility Comments: reports she uses her RW for all mobility ADLs Comments: requires assist from staff at [facility]     Hand Dominance        Extremity/Trunk Assessment   Upper Extremity Assessment Upper Extremity Assessment: Generalized weakness (B UE grossly 4/5)    Lower Extremity Assessment Lower Extremity Assessment: Generalized weakness (B LE grossly 3/5)       Communication    Communication: No difficulties  Cognition Arousal/Alertness: Awake/alert Behavior During Therapy: WFL for tasks assessed/performed Overall Cognitive Status: Within Functional Limits for tasks assessed                                 General Comments: pleasant and agreeable to session        General Comments      Exercises Other Exercises Other Exercises: Received on [facility], supervision for hygiene. Safe technique   Assessment/Plan    PT Assessment Patient needs continued PT services  PT Problem List Decreased strength;Decreased activity tolerance;Decreased balance;Decreased mobility;Pain       PT Treatment Interventions Gait training;DME instruction;Therapeutic exercise;Balance training    PT Goals (Current goals can be found in the Care Plan section)  Acute Rehab PT Goals Patient Stated Goal: to go home PT Goal Formulation: With patient Time For Goal Achievement: 07/06/22 Potential to Achieve Goals: Good    Frequency Min 2X/week     Co-evaluation               AM-PAC PT "6 Clicks" Mobility  Outcome Measure Help needed turning from your back to your side while in a flat bed without using bedrails?: None Help needed moving from lying on your back to sitting on the side of a flat bed without using bedrails?: None Help needed moving to and from a bed to a chair (including a wheelchair)?: A Little Help needed standing up from a chair using your arms (e.g., wheelchair or bedside chair)?: A Little Help needed to walk in hospital room?: A Little Help needed climbing 3-5 steps with a railing? : A Lot 6 Click Score: 19    End of Session Equipment Utilized During Treatment: Gait belt Activity Tolerance: Patient limited by pain Patient left: in chair;with chair alarm set (no alarm box- CNA notified) Nurse Communication: Mobility status PT Visit Diagnosis: Difficulty in walking, not elsewhere classified (R26.2);Muscle weakness (generalized)  (M62.81);Unsteadiness on feet (R26.81);Pain Pain - Right/Left:  (central) Pain - part of body:  (back)    Time: 1610-9604 PT Time Calculation (min) (ACUTE ONLY): 18 min   Charges:   PT Evaluation $PT Eval Low Complexity: 1 Low          Elizabeth Palau, PT, DPT, GCS [phone]   Cathie Bonnell 06/22/2022, 12:35 PM

## 2022-06-22 NOTE — Assessment & Plan Note (Signed)
Start PPI 

## 2022-06-22 NOTE — Assessment & Plan Note (Signed)
Trazodone if needed for sleep.

## 2022-06-22 NOTE — Progress Notes (Signed)
  Progress Note   Patient: Theresa Snyder AVW:[id number] DOB: [birth year] DOA: 06/21/2022     0 DOS: the patient was seen and examined on 06/22/2022   Brief hospital course: 89 year old female complaining of back pain.  Past medical history of hypertension, chronic kidney disease, hyperlipidemia, hypothyroidism, dementia.  5/13.  Patient complains of mid back pain.  X-ray of the thoracic spine shows loss of height of T6.  Will obtain an MRI of the thoracic spine.   Assessment and Plan: * Hyponatremia Sodium 120 upon coming in and up to 125.  Continue gentle IV fluid hydration.   Acute midline thoracic back pain Thoracic x-ray showing loss of height of T6.  Will get an MRI of thoracic spine.  Pain control with oxycodone.  Hypothyroidism Continue levothyroxine  Dementia without behavioral disturbance (HCC) Trazodone if needed for sleep.  Left hip pain X-ray negative  Chronic diastolic CHF (congestive heart failure) (HCC) No signs of heart failure.  Watch closely with IV fluids.  Hiatal hernia Start PPI  Chronic kidney disease CKD stage IIIa.  Creatinine 1.13 with a GFR 46      Subjective: Patient complains of mid back pain.  Patient told me she lives at home with her son but her son said she has been over at [employer].  She has not been sleeping last couple nights secondary to pain.  Physical Exam: Vitals:   06/21/22 2217 06/21/22 2320 06/22/22 0136 06/22/22 0815  BP: 139/64 139/64  (!) 145/68  Pulse: 69 69  78  Resp: (!) 21 20  17   Temp: (!) 97.5 F (36.4 C) (!) 97.5 F (36.4 C)  97.6 F (36.4 C)  TempSrc:      SpO2: 97%   96%  Weight:      Height:   5' (1.524 m)    Physical Exam HENT:     Head: Normocephalic.     Mouth/Throat:     Pharynx: No oropharyngeal exudate.  Eyes:     General: Lids are normal.     Conjunctiva/sclera: Conjunctivae normal.  Cardiovascular:     Rate and Rhythm: Normal rate and regular rhythm.     Heart sounds: Normal heart  sounds, S1 normal and S2 normal.  Pulmonary:     Breath sounds: No decreased breath sounds, wheezing, rhonchi or rales.  Musculoskeletal:     Right lower leg: Swelling present.     Left lower leg: Swelling present.     Comments: Pain to palpating over mid thoracic spine.  Skin:    General: Skin is warm.     Findings: No rash.  Neurological:     Mental Status: She is alert.     Comments: Answers questions appropriately.  Able to straight leg raise.     Data Reviewed: Sodium 125, creatinine 1.13, hemoglobin 11.6, white blood cell count 11.0.  Family Communication: Updated son on the phone  Disposition: Status is: Inpatient Remains inpatient appropriate because: Patient coming in with back pain and trouble sleeping.  X-ray showing loss of height of T6.  Will get an MRI of the thoracic spine.  Planned Discharge Destination: Long-term care    Time spent: 28 minutes  Author: Alford Highland, MD 06/22/2022 3:50 PM  For on call review [email or web address].

## 2022-06-22 NOTE — Assessment & Plan Note (Deleted)
Thoracic x-ray showing loss of height of T6.  Will get an MRI of thoracic spine.  Pain control with oxycodone.

## 2022-06-22 NOTE — Progress Notes (Signed)
Nutrition Brief Note  Patient identified on the Malnutrition Screening Tool (MST) Report  Wt Readings from Last 15 Encounters:  06/21/22 56.7 kg  01/15/22 56.7 kg  12/21/21 57 kg  10/31/21 59 kg  08/15/21 59 kg  07/09/21 58 kg  05/24/21 57.1 kg  07/09/20 63.5 kg  04/12/20 68.5 kg  03/23/20 68 kg  10/09/17 72.6 kg  06/29/16 71.7 kg  09/25/15 71.7 kg   Pt with significant for CKD, hyperlipidemia, hypertension, hypothyroidism.  Pt admitted with shortness of breath and pneumonia.   Reviewed I/O's: +1.1 L x 24 hours   Pt receiving personal care at time of visit.   Pt familiar to this RD from a previous admission. Pt is a resident of [facility]. She is on a regular, NAS diet PTA.   Reviewed wt hx; pt has experienced a 0.5% wt loss over the past 6 months, which is not significant for time frame. Wt has also been stable from previous hospitalization approximately one year ago.   Medications reviewed and include 0.9% sodium chloride infusion @ 40 ml/hr.   Current diet order is regular, patient is consuming approximately n/a% of meals at this time. Labs and medications reviewed.   No nutrition interventions warranted at this time. If nutrition issues arise, please consult RD.   Levada Schilling, RD, LDN, CDCES Registered Dietitian II Certified Diabetes Care and Education Specialist Please refer to [facility] for RD and/or RD on-call/weekend/after hours pager

## 2022-06-23 ENCOUNTER — Encounter: Payer: Self-pay | Admitting: Internal Medicine

## 2022-06-23 DIAGNOSIS — S22050A Wedge compression fracture of T5-T6 vertebra, initial encounter for closed fracture: Secondary | ICD-10-CM | POA: Diagnosis not present

## 2022-06-23 DIAGNOSIS — E039 Hypothyroidism, unspecified: Secondary | ICD-10-CM | POA: Diagnosis not present

## 2022-06-23 DIAGNOSIS — I5032 Chronic diastolic (congestive) heart failure: Secondary | ICD-10-CM | POA: Diagnosis not present

## 2022-06-23 DIAGNOSIS — E871 Hypo-osmolality and hyponatremia: Secondary | ICD-10-CM | POA: Diagnosis not present

## 2022-06-23 LAB — BASIC METABOLIC PANEL
Anion gap: 8 (ref 5–15)
BUN: 23 mg/dL (ref 8–23)
CO2: 28 mmol/L (ref 22–32)
Calcium: 9.3 mg/dL (ref 8.9–10.3)
Chloride: 91 mmol/L — ABNORMAL LOW (ref 98–111)
Creatinine, Ser: 1.05 mg/dL — ABNORMAL HIGH (ref 0.44–1.00)
GFR, Estimated: 50 mL/min — ABNORMAL LOW (ref >60)
Glucose, Bld: 122 mg/dL — ABNORMAL HIGH (ref 70–99)
Potassium: 4.1 mmol/L (ref 3.5–5.1)
Sodium: 127 mmol/L — ABNORMAL LOW (ref 135–145)

## 2022-06-23 LAB — CBC
HCT: 35.5 % — ABNORMAL LOW (ref 36.0–46.0)
Hemoglobin: 12.2 g/dL (ref 12.0–15.0)
MCH: 30.8 pg (ref 26.0–34.0)
MCHC: 34.4 g/dL (ref 30.0–36.0)
MCV: 89.6 fL (ref 80.0–100.0)
Platelets: 317 10*3/uL (ref 150–400)
RBC: 3.96 MIL/uL (ref 3.87–5.11)
RDW: 11.9 % (ref 11.5–15.5)
WBC: 11.1 10*3/uL — ABNORMAL HIGH (ref 4.0–10.5)
nRBC: 0 % (ref 0.0–0.2)

## 2022-06-23 MED ORDER — CEFAZOLIN SODIUM-DEXTROSE 2-4 GM/100ML-% IV SOLN
2.0000 g | Freq: Once | INTRAVENOUS | Status: AC
  Administered 2022-06-24: 2 g via INTRAVENOUS
  Filled 2022-06-23: qty 100

## 2022-06-23 NOTE — Progress Notes (Signed)
IR Procedure Request - Kyphoplasty T6  89 y.o. female inpatient. History of HTN, HLD, CKD, skin cancer, spinal stenosis, seizures, osteoporosis. Presented to the  ED at [facility] on 5.12.24 with [facility], dizziness, nausea. Found to have PNA. While inpatient patient reporting pain in the middle of her shoulder blades.Found to have multiple compression fractures. MR Thoracic Spine from 5.14.24 reads  Within this limitation, there is a compression fracture of the T6 vertebral body, with approximately 60% vertebral body height loss anteriorly and 30% vertebral body height loss posteriorly, with 3 mm retropulsion of the posterior cortex. This may be acute, subacute, or acute on subacute.  Team is requesting T6 kyphoplasty for pain control.   Patient seen at bedside. Denies any loss of bowel or bladder. Denies any decrease in sensation to her lower extremities. Condition made better with use of pain medication. Patient states that she is ambulate more everyday. Patient would like to discuss the procedure with her daughter before proceeding. Team made aware.

## 2022-06-23 NOTE — Plan of Care (Signed)

## 2022-06-23 NOTE — Consult Note (Signed)
Consult requested by:  Dr. Renae Snyder  Consult requested for:  T6 fracture  Primary Physician:  Center, Theresa Snyder  History of Present Illness: 06/23/2022 Ms. Theresa Snyder is here today with a chief complaint of back pain in the middle of her shoulder blades.  She has had shortness of breath as well.  She has had multiple compression fractures in the past and has known osteoporosis.  She feels that her pain is somewhat better today, but she has had pain right in the middle of her back near her shoulder blades.  She denies any numbness or tingling or any other change in neurologic condition.  She is currently living in [facility].  Review of Systems:  A 10 point review of systems is negative, except for the pertinent positives and negatives detailed in the HPI.  Past Medical History: Past Medical History:  Diagnosis Date   Arthritis    Cancer (HCC)    skin   Chronic kidney disease    history of renal insufficiency   History of hiatal hernia    Hypercholesteremia    Hypertension    Hypothyroidism    Lymphedema of leg    Pneumonia    in the past   Seizures (HCC) 2014   no seizures since then   Spinal stenosis     Past Surgical History: Past Surgical History:  Procedure Laterality Date   APPENDECTOMY     CATARACT EXTRACTION W/PHACO Left 09/25/2015   Procedure: CATARACT EXTRACTION PHACO AND INTRAOCULAR LENS PLACEMENT (IOC);  Surgeon: Theresa Lange, MD;  Location: [facility] ORS;  Service: Ophthalmology;  Laterality: Left;  [place] 01:23 AP% 24.0 CDE 34.98 Fluid pack lot # [id number]   CATARACT EXTRACTION W/PHACO Right 07/01/2016   Procedure: CATARACT EXTRACTION PHACO AND INTRAOCULAR LENS PLACEMENT (IOC);  Surgeon: Theresa Lange, MD;  Location: [facility] ORS;  Service: Ophthalmology;  Laterality: Right;  [place] 1:25.9 AP% 24.8 CDE 40.61 Fluid Pack Lot # [id number]   HIP ARTHROPLASTY Left 07/10/2021   Procedure: ARTHROPLASTY BIPOLAR HIP (HEMIARTHROPLASTY);   Surgeon: Theresa Marcus, MD;  Location: [facility] ORS;  Service: Orthopedics;  Laterality: Left;   TUBAL LIGATION      Allergies: Allergies as of 06/21/2022 - Review Complete 06/21/2022  Allergen Reaction Noted   Iodinated contrast media Other (See Comments) 03/04/2015   Lorazepam  03/04/2015   Risperidone Other (See Comments) 03/04/2015   Sulfamethoxazole-trimethoprim  05/28/2015    Medications: Current Meds  Medication Sig   amLODipine (NORVASC) 10 MG tablet Take 10 mg by mouth daily with breakfast. for high blood pressure   cholecalciferol (VITAMIN D3) 25 MCG (1000 UNIT) tablet Take 1,000 Units by mouth daily.   diclofenac Sodium (VOLTAREN) 1 % GEL Apply 2 g topically 4 (four) times daily.   docusate sodium (COLACE) 100 MG capsule Take 1 capsule (100 mg total) by mouth 2 (two) times daily as needed for mild constipation.   furosemide (LASIX) 20 MG tablet Take 1 tablet (20 mg total) by mouth daily as needed for fluid or edema.   gabapentin (NEURONTIN) 300 MG capsule Take 2 capsules (600 mg total) by mouth at bedtime. (Patient taking differently: Take 600 mg by mouth at bedtime. Take 2 capsules nightly)   HYDROcodone-acetaminophen (NORCO) 10-325 MG tablet Take 1 tablet by mouth every 6 (six) hours as needed. 6-8 hours as needed.  SNF use only.  Refills per SNF MD/DO.   levothyroxine (SYNTHROID) 100 MCG tablet Take 100 mcg by mouth daily.   loperamide (IMODIUM)  2 MG capsule Take 2 capsules (4 mg total) by mouth as needed for diarrhea or loose stools.   metoprolol tartrate (LOPRESSOR) 25 MG tablet Take 1 tablet (25 mg total) by mouth 2 (two) times daily.   Multiple Vitamin (MULTIVITAMIN WITH MINERALS) TABS tablet Take 1 tablet by mouth daily.   pravastatin (PRAVACHOL) 20 MG tablet Take 20 mg by mouth at bedtime.    rOPINIRole (REQUIP) 0.25 MG tablet Take 0.25 mg by mouth at bedtime.   senna-docusate (SENOKOT-S) 8.6-50 MG tablet Take 1 tablet by mouth daily.   TYLENOL 325 MG tablet Take 650  mg by mouth every 6 (six) hours as needed for mild pain.    Social History: Social History   Tobacco Use   Smoking status: Never   Smokeless tobacco: Never  Vaping Use   Vaping Use: Never used  Substance Use Topics   Alcohol use: No    Alcohol/week: 0.0 standard drinks of alcohol   Drug use: No    Family Medical History: Family History  Problem Relation Age of Onset   Stroke Mother    Heart attack Mother    Stroke Father    Hypertension Father     Physical Examination: Vitals:   06/23/22 0014 06/23/22 0804  BP: (!) 155/59 (!) 174/76  Pulse: 73 66  Resp: 18 17  Temp: 97.6 F (36.4 C) 98.7 F (37.1 C)  SpO2: 97% 97%    General: Patient is in no apparent distress. Attention to examination is appropriate.  Neck:   Supple.  Full range of motion.  Respiratory: Patient is breathing without any difficulty.   NEUROLOGICAL:     Awake, alert, oriented to person, place, and time.  Speech is clear and fluent.  Cranial Nerves: Pupils equal round and reactive to light.  Facial tone is symmetric.  Facial sensation is symmetric. Shoulder shrug is symmetric. Tongue protrusion is midline.    Strength: Side Biceps Triceps Deltoid Interossei Grip Wrist Ext. Wrist Flex.  R 5 5 5 5 5 5 5   L 5 5 5 5 5 5 5    Side Iliopsoas Quads Hamstring PF DF EHL  R 5 5 5 5 5 5   L 5 5 5 5 5 5    Hoffman's is absent.   Bilateral upper and lower extremity sensation is intact to light touch.    No evidence of dysmetria noted.  Gait is untested.     Medical Decision Making  Imaging: MRI T spine 06/22/2022 IMPRESSION: Evaluation is limited by motion and early termination of the study after the axial T2 sequence was obtained. Within this limitation, there is a compression fracture of the T6 vertebral body, with approximately 60% vertebral body height loss anteriorly and 30% vertebral body height loss posteriorly, with 3 mm retropulsion of the posterior cortex. This may be acute,  subacute, or acute on subacute. No significant spinal canal stenosis.     Electronically Signed   By: Theresa Snyder M.D.   On: 06/22/2022 17:27  I have personally reviewed the images and agree with the above interpretation.  Assessment and Plan: Ms. Theresa Snyder is a pleasant 89 y.o. female with T6 compression fracture with approximately 60% loss of height.  She has no neurologic symptoms.  This is a stable fracture.  We discussed the options including bracing, watchful waiting, pain control, and consideration of kyphoplasty.  I would not recommend any major surgical intervention.  Kyphoplasty is an option, but she is not sure she would like to pursue  that.  She will continue with medical pain control currently.  We discussed that bracing the T6 area is very difficult and not very effective.  She is not interested in this either.  Will arrange follow-up for her.    I have communicated my recommendations to the requesting physician and coordinated care to facilitate these recommendations.     Kazandra Forstrom K. Myer Haff MD, [facility] Neurosurgery

## 2022-06-23 NOTE — Progress Notes (Signed)
Progress Note   Patient: Theresa Snyder WUJ:[id number] DOB: 08-03-1929 DOA: 06/21/2022     1 DOS: the patient was seen and examined on 06/23/2022   Brief hospital course: 89 year old female complaining of back pain.  Past medical history of hypertension, chronic kidney disease, hyperlipidemia, hypothyroidism, dementia.  5/13.  Patient complains of mid back pain.  X-ray of the thoracic spine shows loss of height of T6.  MRI showing T6 compression fracture with 60% vertebral body height loss anteriorly and 30% vertebral height loss posteriorly with 3 mm retropulsion of the posterior cortex.  Case discussed with neurosurgery.  Miacalcin nasal spray.  Pain control. 5/14.  To be evaluated by interventional radiology to see if candidate for kyphoplasty for Thursday.  Pain control.   Assessment and Plan: * Compression fracture of T6 vertebra [facility] Case discussed with neurosurgery and interventional radiology.  Interventional radiology to see patient and evaluate for potential kyphoplasty on Thursday.  Pain control with IV and oral medications.  Miacalcin nasal spray.  Hyponatremia Sodium 120 upon coming in and up to 127.  Will discontinue IV fluids and continue to monitor.   Hypothyroidism Continue levothyroxine  Dementia without behavioral disturbance (HCC) Trazodone if needed for sleep.  Left hip pain X-ray negative  Chronic diastolic CHF (congestive heart failure) (HCC) No signs of heart failure.  Hiatal hernia Continue PPI  Chronic kidney disease CKD stage IIIa.  Creatinine 1.05 with a GFR 50        Subjective: Patient is an 8 out of 10 back pain with her compression fracture.  Came in with severe pain.  Did not sleep last night too well.  Physical Exam: Vitals:   06/22/22 1554 06/23/22 0014 06/23/22 0500 06/23/22 0804  BP: (!) 159/70 (!) 155/59  (!) 174/76  Pulse: 73 73  66  Resp: 18 18  17   Temp: 98 F (36.7 C) 97.6 F (36.4 C)  98.7 F (37.1 C)  TempSrc:  Oral     SpO2: 100% 97%  97%  Weight:   56.4 kg   Height:       Physical Exam HENT:     Head: Normocephalic.     Mouth/Throat:     Pharynx: No oropharyngeal exudate.  Eyes:     General: Lids are normal.     Conjunctiva/sclera: Conjunctivae normal.  Cardiovascular:     Rate and Rhythm: Normal rate and regular rhythm.     Heart sounds: Normal heart sounds, S1 normal and S2 normal.  Pulmonary:     Breath sounds: No decreased breath sounds, wheezing, rhonchi or rales.  Musculoskeletal:     Right lower leg: Swelling present.     Left lower leg: Swelling present.     Comments: Pain to palpating over mid thoracic spine.  Skin:    General: Skin is warm.     Findings: No rash.  Neurological:     Mental Status: She is alert.     Comments: Answers questions appropriately.  Able to straight leg raise.     Data Reviewed: MRI thoracic spine shows a T6 compression fracture with 60% vertebral body height loss anteriorly and 30s percent posteriorly with 3 mm retropulsion of the posterior cortex. White blood cell count 11.1, creatinine 1.05, sodium 127  Family Communication: Spoke with daughter on the phone  Disposition: Status is: Inpatient Remains inpatient appropriate because: Receiving pain control.  Interventional radiology team to speak with patient about potential kyphoplasty on Thursday.  Planned Discharge Destination: Long-term care  Time spent: 29 minutes Case discussed with neurosurgery, IR, nursing staff.  Author: Alford Highland, MD 06/23/2022 2:45 PM  For on call review [email or web address].

## 2022-06-23 NOTE — Assessment & Plan Note (Signed)
Case discussed with neurosurgery and interventional radiology.  Interventional radiology to see patient and evaluate for potential kyphoplasty on Thursday.  Pain control with IV and oral medications.  Miacalcin nasal spray.

## 2022-06-24 ENCOUNTER — Other Ambulatory Visit: Payer: Self-pay | Admitting: Student

## 2022-06-24 ENCOUNTER — Inpatient Hospital Stay: Payer: Medicare (Managed Care)

## 2022-06-24 DIAGNOSIS — M8008XA Age-related osteoporosis with current pathological fracture, vertebra(e), initial encounter for fracture: Secondary | ICD-10-CM | POA: Diagnosis not present

## 2022-06-24 DIAGNOSIS — S32000D Wedge compression fracture of unspecified lumbar vertebra, subsequent encounter for fracture with routine healing: Secondary | ICD-10-CM

## 2022-06-24 LAB — CBC
HCT: 35 % — ABNORMAL LOW (ref 36.0–46.0)
Hemoglobin: 11.9 g/dL — ABNORMAL LOW (ref 12.0–15.0)
MCH: 30.4 pg (ref 26.0–34.0)
MCHC: 34 g/dL (ref 30.0–36.0)
MCV: 89.5 fL (ref 80.0–100.0)
Platelets: 312 10*3/uL (ref 150–400)
RBC: 3.91 MIL/uL (ref 3.87–5.11)
RDW: 12 % (ref 11.5–15.5)
WBC: 12.9 10*3/uL — ABNORMAL HIGH (ref 4.0–10.5)
nRBC: 0 % (ref 0.0–0.2)

## 2022-06-24 LAB — BASIC METABOLIC PANEL
Anion gap: 10 (ref 5–15)
BUN: 22 mg/dL (ref 8–23)
CO2: 24 mmol/L (ref 22–32)
Calcium: 9.1 mg/dL (ref 8.9–10.3)
Chloride: 91 mmol/L — ABNORMAL LOW (ref 98–111)
Creatinine, Ser: 1.03 mg/dL — ABNORMAL HIGH (ref 0.44–1.00)
GFR, Estimated: 52 mL/min — ABNORMAL LOW (ref >60)
Glucose, Bld: 114 mg/dL — ABNORMAL HIGH (ref 70–99)
Potassium: 3.8 mmol/L (ref 3.5–5.1)
Sodium: 125 mmol/L — ABNORMAL LOW (ref 135–145)

## 2022-06-24 LAB — GLUCOSE, CAPILLARY: Glucose-Capillary: 137 mg/dL — ABNORMAL HIGH (ref 70–99)

## 2022-06-24 LAB — PROTIME-INR
INR: 1 (ref 0.8–1.2)
Prothrombin Time: 13 seconds (ref 11.4–15.2)

## 2022-06-24 MED ORDER — SODIUM CHLORIDE 0.9 % IV SOLN: INTRAVENOUS | Status: DC

## 2022-06-24 MED ORDER — ALPRAZOLAM 0.25 MG PO TABS
0.2500 mg | ORAL_TABLET | Freq: Once | ORAL | Status: AC
  Administered 2022-06-24: 0.25 mg via ORAL
  Filled 2022-06-24: qty 1

## 2022-06-24 NOTE — TOC Progression Note (Signed)
Transition of Care [facility]) - Progression Note    Patient Details  Name: Theresa Snyder MRN: [id number] Date of Birth: 21-May-1929  Transition of Care [facility]) CM/SW Contact  Garret Reddish, RN Phone Number: 06/24/2022, 3:50 PM  Clinical Narrative:   Chart reviewed.  I have spoken with Tiffany, Admissions Coordinator for [facility].  She informs me that [name] is a Therapist, nutritional care resident of [facility].  She informs me that patient has a [employer] that will allow her to have Physical Therapy at their facility.  The facility will submit authorization for patient to have Rehab at [facility].  Tiffany reports that facility will obtain insurance authorization but patient does not have to wait until authorization is obtained. She is able to return when stable.    Noted that patient  Radiology has consulted to see patient for T6 Kyphoplasty.  TOC will continue to follow for discharge planning.             Expected Discharge Plan and Services                                               Social Determinants of Health (SDOH) Interventions SDOH Screenings   Food Insecurity: No Food Insecurity (06/22/2022)  Housing: Low Risk  (06/22/2022)  Transportation Needs: No Transportation Needs (06/22/2022)  Utilities: Not At Risk (06/22/2022)  Tobacco Use: Low Risk  (06/23/2022)    Readmission Risk Interventions    12/22/2021    4:06 PM  Readmission Risk Prevention Plan  Transportation Screening Complete  Medication Review (RN Care Manager) Referral to Pharmacy  PCP or Specialist appointment within 3-5 days of discharge Complete  HRI or Home Care Consult Complete  SW Recovery Care/Counseling Consult Complete  Palliative Care Screening Not Applicable  Skilled Nursing Facility Complete

## 2022-06-24 NOTE — Progress Notes (Signed)
   06/24/22 2232  Assess: MEWS Score  Temp (!) 97.5 F (36.4 C)  BP (!) 128/49  MAP (mmHg) 67  Pulse Rate 73  Resp (!) 30  SpO2 100 %  O2 Device Nasal Cannula  O2 Flow Rate (L/min) 2 L/min  Assess: MEWS Score  MEWS Temp 0  MEWS Systolic 0  MEWS Pulse 0  MEWS RR 2  MEWS LOC 0  MEWS Score 2  MEWS Score Color Yellow  Assess: if the MEWS score is Yellow or Red  Were vital signs taken at a resting state? Yes  Focused Assessment Change from prior assessment (see assessment flowsheet)  MEWS guidelines implemented  Yes, yellow  Treat  MEWS Interventions Considered administering scheduled or prn medications/treatments as ordered  Take Vital Signs  Increase Vital Sign Frequency  Yellow: Q2hr x1, continue Q4hrs until patient remains green for 12hrs  Escalate  MEWS: Escalate Yellow: Discuss with charge nurse and consider notifying provider and/or RRT  Notify: Charge Nurse/RN  Name of Charge Nurse/RN Notified Wilacynt Stover/Dorie Wallene Huh  Provider Notification  Provider Name/Title Orpha Bur Foust/NP  Date Provider Notified 06/24/22  Time Provider Notified 2232  Method of Notification  (Secure chat)  Notification Reason Change in status  Provider response En route (will see patient at bedside)  Date of Provider Response 06/24/22  Time of Provider Response 2244  Notify: Rapid Response  Name of Rapid Response RN Notified  (Hospitalist at the bedside)  Assess: SIRS CRITERIA  SIRS Temperature  0  SIRS Pulse 0  SIRS Respirations  1  SIRS WBC 0  SIRS Score Sum  1

## 2022-06-24 NOTE — Progress Notes (Signed)
PROGRESS NOTE    Theresa Snyder  ZOX:[id number] DOB: 1929/06/07 DOA: 06/21/2022 PCP: [facility]    Brief Narrative:  89 year old with previous history of hypertension, CKD stage IIIb, hyperlipidemia, hypothyroidism and dementia from [place] [facility] presented with back pain and found to have T6 compression fracture with 60% vertebral body height loss.  Admitted due to significant pain issues.  Seen by neurology.  Recommended IR for kyphoplasty, however patient is hesitant.   Assessment & Plan:   Acute back pain secondary to compression fracture of T6 vertebrae: Neurosurgery and interventional radiology following.  Recommended kyphoplasty, however patient is hesitant. Currently pain controlled with Tylenol, oxycodone.  Will continue. Start mobilizing with PT OT, patient may likely not want to have surgical procedure.  Anticipate home with home health PT.  Hypochloremic hyponatremia: Probably related to poor oral intake.  Will treat with isotonic fluid until her oral intake improves.  Chronic medical issues including Hypothyroidism, on Synthroid Dementia without behavioral disturbances, trazodone for sleep.  Patient is fairly controlled symptoms. Chronic diastolic dysfunction, euvolemic. Hiatal hernia and GERD, on PPI CKD stage IIIb, at about baseline.    DVT prophylaxis: heparin injection 5,000 Units Start: 06/21/22 2000   Code Status: Full code Family Communication: Called patient's daughter, unable to communicate Disposition Plan: Status is: Inpatient Remains inpatient appropriate because: Back pain, mobility, safe disposition     Consultants:  Neurology Intervention radiology  Procedures:  None  Antimicrobials:  None   Subjective: Patient seen and examined.  Patient tells me that it hurts whenever she wants to move.  Denies any complaints.  Patient tells me that she may not want to have any surgery.  Objective: Vitals:   06/23/22  1558 06/23/22 2352 06/24/22 0500 06/24/22 0819  BP: (!) 156/76 (!) 151/78  125/60  Pulse: (!) 55 68  63  Resp: 16 18  16   Temp: 98.7 F (37.1 C) 97.6 F (36.4 C)  (!) 97.5 F (36.4 C)  TempSrc:      SpO2: 98% 95%  100%  Weight:   60.8 kg   Height:       No intake or output data in the 24 hours ending 06/24/22 1256 Filed Weights   06/21/22 1531 06/23/22 0500 06/24/22 0500  Weight: 56.7 kg 56.4 kg 60.8 kg    Examination:  General: Is appropriate.  Looks comfortable.  Not in any distress.  On room air. Cardiovascular: S1-S2 normal.  Regular rate rhythm. Respiratory: Bilateral clear.  No added sounds. Gastrointestinal: Soft.  Nontender.  Bowel sound present. Ext: No deformities.  No cyanosis or edema. Neuro: Mostly alert and awake.  She is oriented.  Keeps up appropriate conversations.  No focal deficits. Musculoskeletal: No deformities.      Data Reviewed: I have personally reviewed following labs and imaging studies  CBC: Recent Labs  Lab 06/21/22 1542 06/22/22 0348 06/23/22 0454 06/24/22 0619  WBC 10.9* 11.0* 11.1* 12.9*  HGB 12.5 11.6* 12.2 11.9*  HCT 36.4 33.5* 35.5* 35.0*  MCV 89.4 87.7 89.6 89.5  PLT 341 306 317 312   Basic Metabolic Panel: Recent Labs  Lab 06/21/22 1542 06/22/22 0348 06/23/22 0454 06/24/22 0619  NA 120* 125* 127* 125*  K 4.5 4.1 4.1 3.8  CL 83* 92* 91* 91*  CO2 25 23 28 24   GLUCOSE 123* 100* 122* 114*  BUN 27* 26* 23 22  CREATININE 1.19* 1.13* 1.05* 1.03*  CALCIUM 9.3 8.9 9.3 9.1   GFR: Estimated Creatinine Clearance: 29.6 mL/min (  A) (by C-G formula based on SCr of 1.03 mg/dL (H)). Liver Function Tests: Recent Labs  Lab 06/22/22 0348  AST 30  ALT 17  ALKPHOS 97  BILITOT 0.6  PROT 6.8  ALBUMIN 3.8   No results for input(s): "LIPASE", "AMYLASE" in the last 168 hours. No results for input(s): "AMMONIA" in the last 168 hours. Coagulation Profile: Recent Labs  Lab 06/24/22 0619  INR 1.0   Cardiac Enzymes: No results  for input(s): "CKTOTAL", "CKMB", "CKMBINDEX", "TROPONINI" in the last 168 hours. BNP (last 3 results) No results for input(s): "PROBNP" in the last 8760 hours. HbA1C: No results for input(s): "HGBA1C" in the last 72 hours. CBG: No results for input(s): "GLUCAP" in the last 168 hours. Lipid Profile: No results for input(s): "CHOL", "HDL", "LDLCALC", "TRIG", "CHOLHDL", "LDLDIRECT" in the last 72 hours. Thyroid Function Tests: Recent Labs    06/21/22 1631 06/22/22 0348  TSH 2.233  --   FREET4  --  1.75*   Anemia Panel: No results for input(s): "VITAMINB12", "FOLATE", "FERRITIN", "TIBC", "IRON", "RETICCTPCT" in the last 72 hours. Sepsis Labs: No results for input(s): "PROCALCITON", "LATICACIDVEN" in the last 168 hours.  No results found for this or any previous visit (from the past 240 hour(s)).       Radiology Studies: MR THORACIC SPINE WO CONTRAST  Result Date: 06/22/2022 CLINICAL DATA:  Mid back pain EXAM: MRI THORACIC SPINE WITHOUT CONTRAST TECHNIQUE: Multiplanar, multisequence MR imaging of the thoracic spine was performed. No intravenous contrast was administered. COMPARISON:  No prior MRI of the thoracic spine, correlation is made with 06/22/2022 thoracic spine radiograph, MRI lumbar spine 01/26/2022, and 08/12/2021 CTA chest FINDINGS: Evaluation is limited by motion and early termination of the study after the axial T2 sequence was obtained. The axial T2 sequence is essentially nondiagnostic secondary to motion. Alignment: Exaggeration of the normal thoracic kyphosis. No significant listhesis in the thoracic spine. Vertebrae: Decreased T1 and T2 signal in the T6 vertebral body (series 18, image 5 and series 19, image 9), with increased T2 signal along the inferior aspect seen on the STIR sequence (series 20, image 8). Approximately 60% vertebral body height loss anteriorly and 30% vertebral body height loss posteriorly, with 3 mm retropulsion the posterior cortex (series 16, image  5). No other significant vertebral body height loss thoracic spine. Mild wedging of T11 and T12 appears unchanged from the prior lumbar spine MRI. Partially imaged lumbar spine demonstrates remote fractures at L3 and L4, which appears similar to 01/26/2022. Cord:  Unable to evaluate given the sequences obtained. Paraspinal and other soft tissues: No acute finding. Disc levels: Evaluation is limited, but no high-grade spinal canal stenosis is seen. IMPRESSION: Evaluation is limited by motion and early termination of the study after the axial T2 sequence was obtained. Within this limitation, there is a compression fracture of the T6 vertebral body, with approximately 60% vertebral body height loss anteriorly and 30% vertebral body height loss posteriorly, with 3 mm retropulsion of the posterior cortex. This may be acute, subacute, or acute on subacute. No significant spinal canal stenosis. Electronically Signed   By: Wiliam Ke M.D.   On: 06/22/2022 17:27        Scheduled Meds:  amLODipine  10 mg Oral Q breakfast   calcitonin (salmon)  1 spray Alternating Nares Daily   gabapentin  600 mg Oral QHS   heparin  5,000 Units Subcutaneous Q12H   levothyroxine  100 mcg Oral Q0600   metoprolol tartrate  25 mg  Oral BID   pantoprazole  40 mg Oral Daily   sodium chloride flush  3 mL Intravenous Q12H   Continuous Infusions:  [START ON 06/25/2022]  ceFAZolin (ANCEF) IV       LOS: 2 days    Time spent: 35 minutes    Dorcas Carrow, MD [facility] Pager [phone]

## 2022-06-24 NOTE — Progress Notes (Signed)
Trying to wake patient for scheduled medications, patient responds but won't open her eyes, talking incomprehensible words, spelling words letter by letter. She answers questions correctly but responds by spelling words. Asked to put arms up and smile, patient follows and puts arms up. She continuously talks incomprehensible. Reached out to [facility] hospitalist, [facility]. En route to the bedside. Vitals and blood sugar checked.

## 2022-06-24 NOTE — Progress Notes (Signed)
       CROSS COVER NOTE  NAME: Theresa Snyder MRN: [id number] DOB : 05-Jul-1929 ATTENDING PHYSICIAN: Dorcas Carrow, MD  #Altered Mental Status #Delirium Called to bedside by RN because [name] is repeating words and spelling them out. On my evaluation she follows commands, able to tell me her name, DOB. She does not know where she is. She makes eye contact and tracks appropriately. Pupils 2mm equal, round, reactive, to light. No focal deficits appreciated. She endorses a headache. She is repetitively spelling "[id number]-a-[id number]-g-r-[id number]". RN also reports urinary frequency. - CT Head-->Negative - UA --> Rare bacteria, Nitrite negative, Leukocyte Negative - CBC, BMP - VBG--> pH 7.44; pCO2 42; pO2 70;  HCO3 28.5, %O2 Sat 97.2 - Delirium precautions  - Minimize/avoid deliriogenic meds including: anticholinergic, opiates, benzodiazepines           - Maintain hydration, oxygenation, nutrition           - Limit use of restraints and catheters           - Normalize sleep patterns by minimizing nighttime noise, light and interruptions            - Ensure sleep apnea treatment is provided overnight, clustering care, opening blinds during the day           - Reorient the patient frequently, provide easily visible clock and calendar           - Provide sensory aids like glasses, hearing aids           - Encourage ambulation, regular activities and visitors to maintain cognitive stimulation            - Patient would benefit from having family members at bedside to reinforce his orientation   #Hyponatremia Na 125 - Increase NS to 125 mL/hr   CT HEAD WO CONTRAST ( )  Result Date: 06/24/2022 CLINICAL DATA:  Initial evaluation for mental status change, headache. EXAM: CT HEAD WITHOUT CONTRAST TECHNIQUE: Contiguous axial images were obtained from the base of the skull through the vertex without intravenous contrast. RADIATION DOSE REDUCTION: This exam was performed according to the departmental  dose-optimization program which includes automated exposure control, adjustment of the mA and/or kV according to patient size and/or use of iterative reconstruction technique. COMPARISON:  Prior study from 06/21/2022. FINDINGS: Brain: Age-related cerebral atrophy with mild chronic small vessel ischemic disease. No acute intracranial hemorrhage. No acute large vessel territory infarct. No mass lesion or midline shift. No hydrocephalus or extra-axial fluid collection. Vascular: No abnormal hyperdense vessel. Scattered calcified atherosclerosis present at the skull base. Skull: Scalp soft tissues demonstrate no acute finding. Calvarium intact. Sinuses/Orbits: Mild mucoperiosteal thickening present about the ethmoidal air cells and maxillary sinuses. Paranasal sinuses are otherwise largely clear. No significant mastoid effusion. Other: None. IMPRESSION: 1. No acute intracranial abnormality. 2. Age-related cerebral atrophy with mild chronic small vessel ischemic disease. Electronically Signed   By: Rise Mu M.D.   On: 06/24/2022 23:37     To reach the provider On-Call:   7AM- 7PM see care teams to locate the attending and reach out to them via [email or web address] Password: [email or web address] 7PM-7AM contact night-coverage If you still have difficulty reaching the appropriate provider, please page the [facility] (Director on Call) for [facility] on amion for assistance  This document was prepared using Conservation officer, historic buildings and may include unintentional dictation errors.  Bishop Limbo DNP, MBA, FNP-BC, PMHNP-BC Nurse Practitioner [facility] Pager [phone]

## 2022-06-24 NOTE — Plan of Care (Signed)
  Problem: Education: Goal: Knowledge of General Education information will improve Description: Including pain rating scale, medication(s)/side effects and non-pharmacologic comfort measures 06/24/2022 1344 by Woodroe Chen, RN Outcome: Progressing 06/24/2022 1343 by Woodroe Chen, RN Outcome: Progressing   Problem: Health Behavior/Discharge Planning: Goal: Ability to manage health-related needs will improve 06/24/2022 1344 by Woodroe Chen, RN Outcome: Progressing 06/24/2022 1343 by Woodroe Chen, RN Outcome: Progressing   Problem: Clinical Measurements: Goal: Ability to maintain clinical measurements within normal limits will improve 06/24/2022 1344 by Woodroe Chen, RN Outcome: Progressing 06/24/2022 1343 by Woodroe Chen, RN Outcome: Progressing Goal: Will remain free from infection 06/24/2022 1344 by Woodroe Chen, RN Outcome: Progressing 06/24/2022 1343 by Woodroe Chen, RN Outcome: Progressing Goal: Diagnostic test results will improve 06/24/2022 1344 by Woodroe Chen, RN Outcome: Progressing 06/24/2022 1343 by Woodroe Chen, RN Outcome: Progressing Goal: Respiratory complications will improve 06/24/2022 1344 by Woodroe Chen, RN Outcome: Progressing 06/24/2022 1343 by Woodroe Chen, RN Outcome: Progressing Goal: Cardiovascular complication will be avoided 06/24/2022 1344 by Woodroe Chen, RN Outcome: Progressing 06/24/2022 1343 by Woodroe Chen, RN Outcome: Progressing   Problem: Activity: Goal: Risk for activity intolerance will decrease 06/24/2022 1344 by Woodroe Chen, RN Outcome: Progressing 06/24/2022 1343 by Woodroe Chen, RN Outcome: Progressing   Problem: Nutrition: Goal: Adequate nutrition will be maintained 06/24/2022 1344 by Woodroe Chen, RN Outcome: Progressing 06/24/2022 1343 by Woodroe Chen, RN Outcome: Progressing   Problem: Coping: Goal: Level of anxiety will decrease 06/24/2022 1344 by Woodroe Chen, RN Outcome:  Progressing 06/24/2022 1343 by Woodroe Chen, RN Outcome: Progressing   Problem: Elimination: Goal: Will not experience complications related to bowel motility 06/24/2022 1344 by Woodroe Chen, RN Outcome: Progressing 06/24/2022 1343 by Woodroe Chen, RN Outcome: Progressing Goal: Will not experience complications related to urinary retention 06/24/2022 1344 by Woodroe Chen, RN Outcome: Progressing 06/24/2022 1343 by Woodroe Chen, RN Outcome: Progressing   Problem: Pain Managment: Goal: General experience of comfort will improve 06/24/2022 1344 by Woodroe Chen, RN Outcome: Progressing 06/24/2022 1343 by Woodroe Chen, RN Outcome: Progressing   Problem: Safety: Goal: Ability to remain free from injury will improve 06/24/2022 1344 by Woodroe Chen, RN Outcome: Progressing 06/24/2022 1343 by Woodroe Chen, RN Outcome: Progressing   Problem: Skin Integrity: Goal: Risk for impaired skin integrity will decrease 06/24/2022 1344 by Woodroe Chen, RN Outcome: Progressing 06/24/2022 1343 by Woodroe Chen, RN Outcome: Progressing

## 2022-06-24 NOTE — Consult Note (Signed)
Chief Complaint: Patient was seen in consultation today for T6 compression fracture  Referring Physician(s): Alford Highland, MD  Supervising Physician: Pernell Dupre  Patient Status: [facility] - In-pt  History of Present Illness: Theresa Snyder is a 89 y.o. female with PMH significant for CKD, dementia, hypertension, hypothyroidism, and spinal stenosis being seen today in relation to a T6 vertebral compression fracture. The patient is experiencing significant pain which is affecting her quality of life and ability to perform acts of daily living. While she had previously thought pain management through medication would be adequate; she has subsequently endorsed that medication alone is not adequate to address her pain. MRI showed T6 compression fracture with approximately 60% loss of height. IR was subsequently consulted for possible kyphoplasty.   Past Medical History:  Diagnosis Date   Arthritis    Cancer (HCC)    skin   Chronic kidney disease    history of renal insufficiency   History of hiatal hernia    Hypercholesteremia    Hypertension    Hypothyroidism    Lymphedema of leg    Pneumonia    in the past   Seizures (HCC) 2014   no seizures since then   Spinal stenosis     Past Surgical History:  Procedure Laterality Date   APPENDECTOMY     CATARACT EXTRACTION W/PHACO Left 09/25/2015   Procedure: CATARACT EXTRACTION PHACO AND INTRAOCULAR LENS PLACEMENT (IOC);  Surgeon: Sallee Lange, MD;  Location: [facility] ORS;  Service: Ophthalmology;  Laterality: Left;  [place] 01:23 AP% 24.0 CDE 34.98 Fluid pack lot # [id number]   CATARACT EXTRACTION W/PHACO Right 07/01/2016   Procedure: CATARACT EXTRACTION PHACO AND INTRAOCULAR LENS PLACEMENT (IOC);  Surgeon: Sallee Lange, MD;  Location: [facility] ORS;  Service: Ophthalmology;  Laterality: Right;  [place] 1:25.9 AP% 24.8 CDE 40.61 Fluid Pack Lot # [id number]   HIP ARTHROPLASTY Left 07/10/2021   Procedure: ARTHROPLASTY BIPOLAR HIP  (HEMIARTHROPLASTY);  Surgeon: Ross Marcus, MD;  Location: [facility] ORS;  Service: Orthopedics;  Laterality: Left;   TUBAL LIGATION      Allergies: Iodinated contrast media, Lorazepam, Risperidone, and Sulfamethoxazole-trimethoprim  Medications: Prior to Admission medications   Medication Sig Start Date End Date Taking? Authorizing Provider  amLODipine (NORVASC) 10 MG tablet Take 10 mg by mouth daily with breakfast. for high blood pressure 01/12/15  Yes [provider]  cholecalciferol (VITAMIN D3) 25 MCG (1000 UNIT) tablet Take 1,000 Units by mouth daily. 12/19/21  Yes [provider]  diclofenac Sodium (VOLTAREN) 1 % GEL Apply 2 g topically 4 (four) times daily.   Yes [provider]  docusate sodium (COLACE) 100 MG capsule Take 1 capsule (100 mg total) by mouth 2 (two) times daily as needed for mild constipation. 05/24/21  Yes Spongberg, Susy Frizzle, MD  furosemide (LASIX) 20 MG tablet Take 1 tablet (20 mg total) by mouth daily as needed for fluid or edema. 11/01/21 11/01/22 Yes Arnetha Courser, MD  gabapentin (NEURONTIN) 300 MG capsule Take 2 capsules (600 mg total) by mouth at bedtime. Patient taking differently: Take 600 mg by mouth at bedtime. Take 2 capsules nightly 07/14/21  Yes Swayze, Ava, DO  HYDROcodone-acetaminophen (NORCO) 10-325 MG tablet Take 1 tablet by mouth every 6 (six) hours as needed. 6-8 hours as needed.  SNF use only.  Refills per SNF MD/DO. 12/24/21  Yes Sreenath, Sudheer B, MD  levothyroxine (SYNTHROID) 100 MCG tablet Take 100 mcg by mouth daily. 12/19/21  Yes [provider]  loperamide (IMODIUM) 2  MG capsule Take 2 capsules (4 mg total) by mouth as needed for diarrhea or loose stools. 12/24/21  Yes Sreenath, Sudheer B, MD  metoprolol tartrate (LOPRESSOR) 25 MG tablet Take 1 tablet (25 mg total) by mouth 2 (two) times daily. 07/14/21  Yes Swayze, Ava, DO  Multiple Vitamin (MULTIVITAMIN WITH MINERALS) TABS tablet Take 1 tablet by mouth  daily. 07/15/21  Yes Swayze, Ava, DO  pravastatin (PRAVACHOL) 20 MG tablet Take 20 mg by mouth at bedtime.    Yes [provider]  rOPINIRole (REQUIP) 0.25 MG tablet Take 0.25 mg by mouth at bedtime. 12/20/21  Yes [provider]  senna-docusate (SENOKOT-S) 8.6-50 MG tablet Take 1 tablet by mouth daily.   Yes [provider]  TYLENOL 325 MG tablet Take 650 mg by mouth every 6 (six) hours as needed for mild pain. 08/06/21  Yes [provider]  albuterol (VENTOLIN HFA) 108 (90 Base) MCG/ACT inhaler Inhale 2 puffs into the lungs every 6 (six) hours as needed for wheezing or shortness of breath. 08/15/21   Pokhrel, Rebekah Chesterfield, MD     Family History  Problem Relation Age of Onset   Stroke Mother    Heart attack Mother    Stroke Father    Hypertension Father     Social History   Socioeconomic History   Marital status: Divorced    Spouse name: Not on file   Number of children: Not on file   Years of education: Not on file   Highest education level: Not on file  Occupational History   Not on file  Tobacco Use   Smoking status: Never   Smokeless tobacco: Never  Vaping Use   Vaping Use: Never used  Substance and Sexual Activity   Alcohol use: No    Alcohol/week: 0.0 standard drinks of alcohol   Drug use: No   Sexual activity: Not on file  Other Topics Concern   Not on file  Social History Narrative   Not on file   Social Determinants of Health   Financial Resource Strain: Not on file  Food Insecurity: No Food Insecurity (06/22/2022)   Hunger Vital Sign    Worried About Running Out of Food in the Last Year: Never true    Ran Out of Food in the Last Year: Never true  Transportation Needs: No Transportation Needs (06/22/2022)   PRAPARE - Administrator, Civil Service (Medical): No    Lack of Transportation (Non-Medical): No  Physical Activity: Not on file  Stress: Not on file  Social Connections: Not on file     Code Status: Full  Code   Review of Systems: A 12 point ROS discussed and pertinent positives are indicated in the HPI above.  All other systems are negative.  Review of Systems  Constitutional:  Negative for chills and fever.  Respiratory:  Negative for chest tightness and shortness of breath.   Cardiovascular:  Negative for chest pain and leg swelling.  Gastrointestinal:  Negative for abdominal pain, diarrhea, nausea and vomiting.  Musculoskeletal:  Positive for back pain.  Neurological:  Negative for dizziness and headaches.  Psychiatric/Behavioral:  Positive for confusion.     Vital Signs: BP 123/70   Pulse 66   Temp 98.3 F (36.8 C)   Resp 18   Ht 5' (1.524 m)   Wt 134 lb 0.6 oz (60.8 kg)   SpO2 96%   BMI 26.18 kg/m     Physical Exam Vitals reviewed.  Constitutional:  General: She is not in acute distress.    Appearance: She is not ill-appearing.  Cardiovascular:     Rate and Rhythm: Normal rate and regular rhythm.     Pulses: Normal pulses.     Heart sounds: Normal heart sounds.  Pulmonary:     Effort: Pulmonary effort is normal.     Breath sounds: Normal breath sounds.  Abdominal:     Palpations: Abdomen is soft.     Tenderness: There is no abdominal tenderness.  Musculoskeletal:     Right lower leg: No edema.     Left lower leg: No edema.  Skin:    General: Skin is warm and dry.  Neurological:     Mental Status: She is alert and oriented to person, place, and time. Mental status is at baseline.  Psychiatric:        Mood and Affect: Mood normal.        Behavior: Behavior normal.     Imaging: MR THORACIC SPINE WO CONTRAST  Result Date: 06/22/2022 CLINICAL DATA:  Mid back pain EXAM: MRI THORACIC SPINE WITHOUT CONTRAST TECHNIQUE: Multiplanar, multisequence MR imaging of the thoracic spine was performed. No intravenous contrast was administered. COMPARISON:  No prior MRI of the thoracic spine, correlation is made with 06/22/2022 thoracic spine radiograph, MRI lumbar  spine 01/26/2022, and 08/12/2021 CTA chest FINDINGS: Evaluation is limited by motion and early termination of the study after the axial T2 sequence was obtained. The axial T2 sequence is essentially nondiagnostic secondary to motion. Alignment: Exaggeration of the normal thoracic kyphosis. No significant listhesis in the thoracic spine. Vertebrae: Decreased T1 and T2 signal in the T6 vertebral body (series 18, image 5 and series 19, image 9), with increased T2 signal along the inferior aspect seen on the STIR sequence (series 20, image 8). Approximately 60% vertebral body height loss anteriorly and 30% vertebral body height loss posteriorly, with 3 mm retropulsion the posterior cortex (series 16, image 5). No other significant vertebral body height loss thoracic spine. Mild wedging of T11 and T12 appears unchanged from the prior lumbar spine MRI. Partially imaged lumbar spine demonstrates remote fractures at L3 and L4, which appears similar to 01/26/2022. Cord:  Unable to evaluate given the sequences obtained. Paraspinal and other soft tissues: No acute finding. Disc levels: Evaluation is limited, but no high-grade spinal canal stenosis is seen. IMPRESSION: Evaluation is limited by motion and early termination of the study after the axial T2 sequence was obtained. Within this limitation, there is a compression fracture of the T6 vertebral body, with approximately 60% vertebral body height loss anteriorly and 30% vertebral body height loss posteriorly, with 3 mm retropulsion of the posterior cortex. This may be acute, subacute, or acute on subacute. No significant spinal canal stenosis. Electronically Signed   By: Wiliam Ke M.D.   On: 06/22/2022 17:27   DG Thoracic Spine 2 View  Result Date: 06/22/2022 CLINICAL DATA:  Back pain EXAM: THORACIC SPINE 2 VIEWS COMPARISON:  Lumbar spine radiograph done on 03/03/2022, CT chest done on 08/12/2021 FINDINGS: No recent fracture is seen. Osteopenia is seen in bony  structures. There is moderate decrease in height of body of T6 vertebra. There is mild decrease in height of lower thoracic vertebral bodies, T11, T12 and L1 vertebrae. As far as seen, alignment of posterior margins of vertebral bodies is within normal limits. There is large hiatal hernia containing portion of transverse colon and stomach. IMPRESSION: There is moderate decrease in height of body of T6  vertebra. This finding was not evident in the CT chest done on 08/12/2021. If clinically warranted, follow-up CT or MRI may be considered. There is mild decrease in height of bodies of T11, T12 and L1 vertebrae with no definite interval change. Electronically Signed   By: Ernie Avena M.D.   On: 06/22/2022 15:14   DG HIPS BILAT WITH PELVIS MIN 5 VIEWS  Result Date: 06/21/2022 CLINICAL DATA:  [id number] Hip pain, acute [id number] EXAM: DG HIP (WITH OR WITHOUT PELVIS) 5+V BILAT COMPARISON:  None Available. FINDINGS: Total left hip arthroplasty. No radiographic finding of surgical hardware complication. There is no evidence of hip fracture or dislocation of the right hip. No acute displaced fracture or diastasis of the bones of the pelvis. There is no evidence of severe arthropathy or other focal bone abnormality. Visualized lower lumbar spine demonstrates degenerative changes. IMPRESSION: Negative for acute traumatic injury in a patient status post total left hip arthroplasty. Electronically Signed   By: Tish Frederickson M.D.   On: 06/21/2022 23:34   CT Head Wo Contrast  Result Date: 06/21/2022 CLINICAL DATA:  Headaches and recent pneumonia diagnosis, initial encounter EXAM: CT HEAD WITHOUT CONTRAST TECHNIQUE: Contiguous axial images were obtained from the base of the skull through the vertex without intravenous contrast. RADIATION DOSE REDUCTION: This exam was performed according to the departmental dose-optimization program which includes automated exposure control, adjustment of the mA and/or kV according to  patient size and/or use of iterative reconstruction technique. COMPARISON:  10/31/2021 FINDINGS: Brain: No evidence of acute infarction, hemorrhage, hydrocephalus, extra-axial collection or mass lesion/mass effect. Mild atrophic changes are noted. Chronic white matter ischemic changes are seen. Vascular: No hyperdense vessel or unexpected calcification. Skull: Normal. Negative for fracture or focal lesion. Sinuses/Orbits: No acute finding. Other: None. IMPRESSION: Chronic atrophic and ischemic changes are noted. No acute abnormality noted. Electronically Signed   By: Alcide Clever M.D.   On: 06/21/2022 18:33   DG Chest 2 View  Result Date: 06/21/2022 CLINICAL DATA:  Chest pain with shortness of breath EXAM: CHEST - 2 VIEW COMPARISON:  Chest x-ray 12/20/2021 FINDINGS: Large hiatal hernia is again seen. The cardiomediastinal silhouette is otherwise within normal limits. There is no definite focal lung infiltrate, pleural effusion or pneumothorax. No acute fractures are seen. There are chronic right rib fractures. IMPRESSION: 1. No acute cardiopulmonary process. 2. Large hiatal hernia. Electronically Signed   By: Darliss Cheney M.D.   On: 06/21/2022 17:34    Labs:  CBC: Recent Labs    06/21/22 1542 06/22/22 0348 06/23/22 0454 06/24/22 0619  WBC 10.9* 11.0* 11.1* 12.9*  HGB 12.5 11.6* 12.2 11.9*  HCT 36.4 33.5* 35.5* 35.0*  PLT 341 306 317 312    COAGS: Recent Labs    07/10/21 0418 08/12/21 1513 10/31/21 1158 06/24/22 0619  INR 1.1 1.1 1.0 1.0  APTT 34 41* 30  --     BMP: Recent Labs    06/21/22 1542 06/22/22 0348 06/23/22 0454 06/24/22 0619  NA 120* 125* 127* 125*  K 4.5 4.1 4.1 3.8  CL 83* 92* 91* 91*  CO2 25 23 28 24   GLUCOSE 123* 100* 122* 114*  BUN 27* 26* 23 22  CALCIUM 9.3 8.9 9.3 9.1  CREATININE 1.19* 1.13* 1.05* 1.03*  GFRNONAA 43* 46* 50* 52*    LIVER FUNCTION TESTS: Recent Labs    08/14/21 0500 10/31/21 1158 12/20/21 2315 06/22/22 0348  BILITOT 0.4 1.0  1.3* 0.6  AST 17 26 26 30   ALT  10 16 13 17   ALKPHOS 85 77 88 97  PROT 6.0* 7.4 7.7 6.8  ALBUMIN 2.7* 4.2 3.9 3.8    TUMOR MARKERS: No results for input(s): "AFPTM", "CEA", "CA199", "CHROMGRNA" in the last 8760 hours.  Assessment and Plan:  Theresa Snyder is a 89 yo female being seen today in relation to a T6 vertebral compression fracture. Patient is experiencing severe pain with movement that is not adequately controlled with pain medication and is affecting her activities of daily living. Patient was evaluated by Neurosurgery and felt to be a good candidate for kyphoplasty. IR subsequently consulted for image-guided T6 kyphoplasty. Case has been reviewed and approved by Dr Juliette Alcide. While the patient was initially hesitant regarding the procedure, after multiple discussions regarding kyphoplasty, and discussion held with the patient's daughter, both the patient and her daughter were amenable to proceed.  Risks and benefits of image-guided T6 kyphoplasty were discussed with the patient's daughter including, but not limited to education regarding the natural healing process of compression fractures without intervention, bleeding, infection, cement migration which may cause spinal cord damage, paralysis, pulmonary embolism or even death.  This interventional procedure involves the use of X-rays and because of the nature of the planned procedure, it is possible that we will have prolonged use of X-ray fluoroscopy.  Potential radiation risks to you include (but are not limited to) the following: - A slightly elevated risk for cancer  several years later in life. This risk is typically less than 0.5% percent. This risk is low in comparison to the normal incidence of human cancer, which is 33% for women and 50% for men according to the [employer] Society. - Radiation induced injury can include skin redness, resembling a rash, tissue breakdown / ulcers and hair loss (which can be temporary or  permanent).   The likelihood of either of these occurring depends on the difficulty of the procedure and whether you are sensitive to radiation due to previous procedures, disease, or genetic conditions.   IF your procedure requires a prolonged use of radiation, you will be notified and given written instructions for further action.  It is your responsibility to monitor the irradiated area for the 2 weeks following the procedure and to notify your physician if you are concerned that you have suffered a radiation induced injury.    All of the patient's daughter's questions were answered, patient is agreeable to proceed.  Consent signed and in chart.    Thank you for this interesting consult.  I greatly enjoyed meeting Theresa Snyder and look forward to participating in their care.  A copy of this report was sent to the requesting provider on this date.  Electronically Signed: Kennieth Francois, PA-C 06/24/2022, 3:12 PM   I spent a total of 55 Miinutes in face to face in clinical consultation, greater than 50% of which was counseling/coordinating care for T6 compression fracture.

## 2022-06-24 NOTE — Progress Notes (Signed)
Physical Therapy Treatment Patient Details Name: Theresa Snyder MRN: [id number] DOB: [birth year] Today's Date: 06/24/2022   History of Present Illness Theresa Snyder is a 89yoF comes to [facility] for complaints of SOB symptoms. History includes HLD, HTN, and seizures. Imaging revealing of T6 compression fracture, PMh prior fractures, known osteoporosis. Pt resides LTC at [facility].    PT Comments    Pt asleep on entry, but arouses quite quickly and surprisingly is very much on board with getting up and moving around some. She does remarkably well, no apparent pain limitations, but pt does frame her motivations in the context of pain as a potential demotivator. Pt partakes with various stepping activities at bedside, follows  commands well, no frank signs of exertion but makes known when she is ready for a rest. Pt assisted back to bed at [facility].    Recommendations for follow up therapy are one component of a multi-disciplinary discharge planning process, led by the attending physician.  Recommendations may be updated based on patient status, additional functional criteria and insurance authorization.  Follow Up Recommendations       Assistance Recommended at Discharge Intermittent Supervision/Assistance  Patient can return home with the following A little help with walking and/or transfers;A little help with bathing/dressing/bathroom;Assist for transportation   Equipment Recommendations  None recommended by PT    Recommendations for Other Services       Precautions / Restrictions Precautions Precautions: Fall Restrictions Weight Bearing Restrictions: No     Mobility  Bed Mobility Overal bed mobility: Needs Assistance Bed Mobility: Supine to Sit, Sit to Supine     Supine to sit: Min guard, HOB elevated Sit to supine: Min assist   General bed mobility comments: mostly assisted for reposition for comfort    Transfers Overall transfer level: Needs assistance Equipment used:  None Transfers: Sit to/from Stand Sit to Stand: Supervision           General transfer comment: moving fairly well    Ambulation/Gait Ambulation/Gait assistance: Min guard Gait Distance (Feet): 80 Feet Assistive device: Rolling walker (2 wheels) Gait Pattern/deviations:  (see misc exercises section)           Stairs             Wheelchair Mobility    Modified Rankin (Stroke Patients Only)       Balance                                            Cognition Arousal/Alertness:  (seems a  little confused, conversation makes me think she thinks were at her house right now)                                     General Comments: pleasant and agreeable to session        Exercises Other Exercises Other Exercises: At bedside: FWD/retro AMB 96ft each c RW; FWD AMB with RW 3x67ft, then lateral stepping 2x54ft bilat with hands on countertop, then 35ft AMB window to EOB.    General Comments        Pertinent Vitals/Pain Pain Assessment Pain Assessment: 0-10 Pain Score: 6  Pain Location: shoulder blades Pain Intervention(s): Limited activity within patient's tolerance, Monitored during session    Home Living  Prior Function            PT Goals (current goals can now be found in the care plan section) Acute Rehab PT Goals Patient Stated Goal: to go home PT Goal Formulation: With patient Time For Goal Achievement: 07/06/22 Potential to Achieve Goals: Good Progress towards PT goals: Progressing toward goals    Frequency    Min 2X/week      PT Plan Current plan remains appropriate    Co-evaluation              AM-PAC PT "6 Clicks" Mobility   Outcome Measure  Help needed turning from your back to your side while in a flat bed without using bedrails?: None Help needed moving from lying on your back to sitting on the side of a flat bed without using bedrails?: None Help  needed moving to and from a bed to a chair (including a wheelchair)?: A Little Help needed standing up from a chair using your arms (e.g., wheelchair or bedside chair)?: A Little Help needed to walk in hospital room?: A Little Help needed climbing 3-5 steps with a railing? : A Little 6 Click Score: 20    End of Session Equipment Utilized During Treatment: Gait belt Activity Tolerance: Patient tolerated treatment well;No increased pain Patient left: in bed;with bed alarm set (upright in bed, HOB at 45 degrees)   PT Visit Diagnosis: Difficulty in walking, not elsewhere classified (R26.2);Muscle weakness (generalized) (M62.81);Unsteadiness on feet (R26.81);Pain     Time: 1610-9604 PT Time Calculation (min) (ACUTE ONLY): 15 min  Charges:  $Therapeutic Activity: 8-22 mins                    3:34 PM, 06/24/22 Theresa Snyder, PT, DPT Physical Therapist - [facility]  [phone] (ASCOM)    Theresa Snyder C 06/24/2022, 3:31 PM

## 2022-06-25 ENCOUNTER — Inpatient Hospital Stay: Payer: Medicare (Managed Care) | Admitting: Radiology

## 2022-06-25 DIAGNOSIS — M8008XA Age-related osteoporosis with current pathological fracture, vertebra(e), initial encounter for fracture: Secondary | ICD-10-CM | POA: Diagnosis not present

## 2022-06-25 LAB — BASIC METABOLIC PANEL
Anion gap: 9 (ref 5–15)
Anion gap: 9 (ref 5–15)
BUN: 23 mg/dL (ref 8–23)
BUN: 20 mg/dL (ref 8–23)
CO2: 25 mmol/L (ref 22–32)
CO2: 24 mmol/L (ref 22–32)
Calcium: 8.5 mg/dL — ABNORMAL LOW (ref 8.9–10.3)
Calcium: 8.3 mg/dL — ABNORMAL LOW (ref 8.9–10.3)
Chloride: 91 mmol/L — ABNORMAL LOW (ref 98–111)
Chloride: 93 mmol/L — ABNORMAL LOW (ref 98–111)
Creatinine, Ser: 1.02 mg/dL — ABNORMAL HIGH (ref 0.44–1.00)
Creatinine, Ser: 1 mg/dL (ref 0.44–1.00)
GFR, Estimated: 52 mL/min — ABNORMAL LOW (ref >60)
GFR, Estimated: 54 mL/min — ABNORMAL LOW (ref >60)
Glucose, Bld: 131 mg/dL — ABNORMAL HIGH (ref 70–99)
Glucose, Bld: 92 mg/dL (ref 70–99)
Potassium: 3.5 mmol/L (ref 3.5–5.1)
Potassium: 3.3 mmol/L — ABNORMAL LOW (ref 3.5–5.1)
Sodium: 125 mmol/L — ABNORMAL LOW (ref 135–145)
Sodium: 126 mmol/L — ABNORMAL LOW (ref 135–145)

## 2022-06-25 LAB — URINALYSIS, ROUTINE W REFLEX MICROSCOPIC
Bilirubin Urine: NEGATIVE
Glucose, UA: NEGATIVE mg/dL
Hgb urine dipstick: NEGATIVE
Ketones, ur: NEGATIVE mg/dL
Leukocytes,Ua: NEGATIVE
Nitrite: NEGATIVE
Protein, ur: 30 mg/dL — AB
Specific Gravity, Urine: 1.016 (ref 1.005–1.030)
pH: 5 (ref 5.0–8.0)

## 2022-06-25 LAB — CBC
HCT: 32.5 % — ABNORMAL LOW (ref 36.0–46.0)
HCT: 37 % (ref 36.0–46.0)
Hemoglobin: 11.1 g/dL — ABNORMAL LOW (ref 12.0–15.0)
Hemoglobin: 12.7 g/dL (ref 12.0–15.0)
MCH: 30.5 pg (ref 26.0–34.0)
MCH: 30.5 pg (ref 26.0–34.0)
MCHC: 34.2 g/dL (ref 30.0–36.0)
MCHC: 34.3 g/dL (ref 30.0–36.0)
MCV: 89.3 fL (ref 80.0–100.0)
MCV: 88.9 fL (ref 80.0–100.0)
Platelets: 301 10*3/uL (ref 150–400)
Platelets: 304 10*3/uL (ref 150–400)
RBC: 3.64 MIL/uL — ABNORMAL LOW (ref 3.87–5.11)
RBC: 4.16 MIL/uL (ref 3.87–5.11)
RDW: 11.9 % (ref 11.5–15.5)
RDW: 12 % (ref 11.5–15.5)
WBC: 9.7 10*3/uL (ref 4.0–10.5)
WBC: 9.6 10*3/uL (ref 4.0–10.5)
nRBC: 0 % (ref 0.0–0.2)
nRBC: 0 % (ref 0.0–0.2)

## 2022-06-25 LAB — BLOOD GAS, VENOUS
Acid-Base Excess: 3.9 mmol/L — ABNORMAL HIGH (ref 0.0–2.0)
Bicarbonate: 28.5 mmol/L — ABNORMAL HIGH (ref 20.0–28.0)
O2 Saturation: 97.2 %
Patient temperature: 37
pCO2, Ven: 42 mmHg — ABNORMAL LOW (ref 44–60)
pH, Ven: 7.44 — ABNORMAL HIGH (ref 7.25–7.43)
pO2, Ven: 70 mmHg — ABNORMAL HIGH (ref 32–45)

## 2022-06-25 LAB — PROTIME-INR
INR: 0.9 (ref 0.8–1.2)
Prothrombin Time: 12.8 seconds (ref 11.4–15.2)

## 2022-06-25 MED ORDER — CEFAZOLIN SODIUM-DEXTROSE 2-4 GM/100ML-% IV SOLN
2.0000 g | INTRAVENOUS | Status: AC
  Filled 2022-06-25: qty 100

## 2022-06-25 MED ORDER — OXYCODONE HCL 5 MG PO TABS: 5.0000 mg | ORAL_TABLET | ORAL | Status: DC | PRN

## 2022-06-25 MED ORDER — ACETAMINOPHEN 500 MG PO TABS
1000.0000 mg | ORAL_TABLET | Freq: Four times a day (QID) | ORAL | Status: DC
  Administered 2022-06-25 – 2022-06-26 (×5): 1000 mg via ORAL
  Filled 2022-06-25 (×5): qty 2

## 2022-06-25 MED ORDER — SODIUM CHLORIDE 0.9 % IV SOLN: INTRAVENOUS | Status: DC

## 2022-06-25 MED ORDER — LIDOCAINE 5 % EX PTCH
1.0000 | MEDICATED_PATCH | CUTANEOUS | Status: DC
  Administered 2022-06-25 – 2022-06-26 (×2): 1 via TRANSDERMAL
  Filled 2022-06-25 (×2): qty 1

## 2022-06-25 MED ORDER — POTASSIUM CHLORIDE CRYS ER 20 MEQ PO TBCR
40.0000 meq | EXTENDED_RELEASE_TABLET | Freq: Once | ORAL | Status: AC
  Administered 2022-06-25: 40 meq via ORAL
  Filled 2022-06-25: qty 2

## 2022-06-25 MED ORDER — ROPINIROLE HCL 0.25 MG PO TABS
0.2500 mg | ORAL_TABLET | Freq: Every day | ORAL | Status: DC
  Administered 2022-06-25: 0.25 mg via ORAL
  Filled 2022-06-25: qty 1

## 2022-06-25 MED ORDER — LIDOCAINE HCL (PF) 1 % IJ SOLN
INTRAMUSCULAR | Status: AC
  Filled 2022-06-25: qty 30

## 2022-06-25 NOTE — Care Management Important Message (Signed)
Important Message  Patient Details  Name: Theresa Snyder MRN: [id number] Date of Birth: July 18, 1929   Medicare Important Message Given:  Yes     Olegario Messier A Armondo Cech 06/25/2022, 10:39 AM

## 2022-06-25 NOTE — Progress Notes (Addendum)
Spoke with patient's daughter regarding documented contrast allergy with comment that patient is allergic to blue, red, and green dyes. Per patient's daughter, Burman Freestone, patient does not have an allergy to iodinated contrast material, but she has been told to avoid it if possible due to the patient's CKD.   Addendum IR team was contacted by patient's RN with concern of new-onset mental status changes. Patient was examined and found to be significantly altered compared to mental status on exam yesterday. Patient was unable to answer questions on exam today, with the patient repeating random letters such as "C-[id number]" when asked her name or location. Given the change in mental status, case was discussed with Dr Juliette Alcide and decision was made to postpone scheduled kyphoplasty once the mental status change has been explored further with the patient's primary team. The primary team has been made aware. Please contact IR team with any questions or concerns.  Kennieth Francois, PA-C 06/25/2022 8:06 AM

## 2022-06-25 NOTE — Progress Notes (Signed)
PROGRESS NOTE    Theresa Snyder  ZOX:[id number] DOB: 01-Jun-1929 DOA: 06/21/2022 PCP: [facility]    Brief Narrative:  89 year old with previous history of hypertension, CKD stage IIIb, hyperlipidemia, hypothyroidism and dementia from [place] [facility] presented with back pain and found to have T6 compression fracture with 60% vertebral body height loss.  Admitted due to significant pain issues.  Seen by neurology.  Recommended IR for kyphoplasty,  5/16, developed delirium overnight.  Procedure canceled.   Assessment & Plan:   Acute back pain secondary to compression fracture of T6 vertebrae: Neurosurgery and interventional radiology following.  Recommended kyphoplasty, however patient was initially hesitant and now developed delirium. Will start patient on around-the-clock Tylenol, continue oxycodone that she takes at home  Working with PT OT. Will avoid going for another procedure with sedation unless cleared from current delirium. If able to manage with Tylenol and oxycodone, will avoid Aggressive.  Hospital-acquired delirium in a patient with dementia: Long-term nursing home resident.  Memory deficits at baseline but functional. Overnight developed delirium. No evidence of infection, urinalysis was normal. Fall precautions.  Delirium precautions.  Avoid any benzodiazepines.  She has taken Norco in the past 3 to 4-day, will continue with oxycodone.  Multimodal pain management with Tylenol, oxycodone and lidocaine patch.  Hypochloremic hyponatremia: Probably related to poor oral intake.  Will treat with isotonic fluid until her oral intake improves.  Hypokalemia: Replaced.  Chronic medical issues including Hypothyroidism, on Synthroid Dementia without behavioral disturbances, trazodone for sleep.  General delirium as above. Chronic diastolic dysfunction, euvolemic.  Maintenance IV fluids today. Hiatal hernia and GERD, on PPI CKD stage IIIb, at about  baseline.    DVT prophylaxis: heparin injection 5,000 Units Start: 06/21/22 2000   Code Status: Full code Family Communication: Patient's daughter at the bedside. Disposition Plan: Status is: Inpatient Remains inpatient appropriate because: Back pain, mobility, safe disposition     Consultants:  Neurosurgery Intervention radiology  Procedures:  None  Antimicrobials:  None   Subjective: Patient seen and examined.  She is mostly sleepy and not participating in conversation.  Overnight events noted.  Patient is started spelling her words.  Daughter at the bedside tells me that if she is in much pain she does start spelling words one by one.  Patient did develop delirium in the past while hospitalized.  Remains afebrile.   Objective: Vitals:   06/24/22 2232 06/24/22 2353 06/25/22 0457 06/25/22 0809  BP: (!) 128/49 (!) 171/72  (!) 153/63  Pulse: 73 71  (!) 51  Resp: (!) 30 20  20   Temp: (!) 97.5 F (36.4 C) (!) 97.5 F (36.4 C)  99 F (37.2 C)  TempSrc:      SpO2: 100% 96%  100%  Weight:   61 kg   Height:        Intake/Output Summary (Last 24 hours) at 06/25/2022 1329 Last data filed at 06/25/2022 1000 Gross per 24 hour  Intake 731.34 ml  Output 300 ml  Net 431.34 ml   Filed Weights   06/23/22 0500 06/24/22 0500 06/25/22 0457  Weight: 56.4 kg 60.8 kg 61 kg    Examination:  General: Mostly sleepy.  Confused on waking up in interview.  Looks comfortable while sleeping. Cardiovascular: S1-S2 normal.  Regular rate rhythm. Respiratory: Bilateral clear.  No added sounds. Gastrointestinal: Soft.  Nontender.  Bowel sound present. Ext: No deformities.  No cyanosis or edema. Neuro: Sleepy.  Not following much commands.  Moves all extremities. Musculoskeletal:  No deformities.      Data Reviewed: I have personally reviewed following labs and imaging studies  CBC: Recent Labs  Lab 06/22/22 0348 06/23/22 0454 06/24/22 0619 06/24/22 2325 06/25/22 0948  WBC  11.0* 11.1* 12.9* 9.7 9.6  HGB 11.6* 12.2 11.9* 11.1* 12.7  HCT 33.5* 35.5* 35.0* 32.5* 37.0  MCV 87.7 89.6 89.5 89.3 88.9  PLT 306 317 312 301 304    Basic Metabolic Panel: Recent Labs  Lab 06/22/22 0348 06/23/22 0454 06/24/22 0619 06/24/22 2325 06/25/22 0626  NA 125* 127* 125* 125* 126*  K 4.1 4.1 3.8 3.5 3.3*  CL 92* 91* 91* 91* 93*  CO2 23 28 24 25 24   GLUCOSE 100* 122* 114* 131* 92  BUN 26* 23 22 23 20   CREATININE 1.13* 1.05* 1.03* 1.02* 1.00  CALCIUM 8.9 9.3 9.1 8.5* 8.3*    GFR: Estimated Creatinine Clearance: 30.5 mL/min (by C-G formula based on SCr of 1 mg/dL). Liver Function Tests: Recent Labs  Lab 06/22/22 0348  AST 30  ALT 17  ALKPHOS 97  BILITOT 0.6  PROT 6.8  ALBUMIN 3.8    No results for input(s): "LIPASE", "AMYLASE" in the last 168 hours. No results for input(s): "AMMONIA" in the last 168 hours. Coagulation Profile: Recent Labs  Lab 06/24/22 0619 06/25/22 0948  INR 1.0 0.9    Cardiac Enzymes: No results for input(s): "CKTOTAL", "CKMB", "CKMBINDEX", "TROPONINI" in the last 168 hours. BNP (last 3 results) No results for input(s): "PROBNP" in the last 8760 hours. HbA1C: No results for input(s): "HGBA1C" in the last 72 hours. CBG: Recent Labs  Lab 06/24/22 2238  GLUCAP 137*   Lipid Profile: No results for input(s): "CHOL", "HDL", "LDLCALC", "TRIG", "CHOLHDL", "LDLDIRECT" in the last 72 hours. Thyroid Function Tests: No results for input(s): "TSH", "T4TOTAL", "FREET4", "T3FREE", "THYROIDAB" in the last 72 hours.  Anemia Panel: No results for input(s): "VITAMINB12", "FOLATE", "FERRITIN", "TIBC", "IRON", "RETICCTPCT" in the last 72 hours. Sepsis Labs: No results for input(s): "PROCALCITON", "LATICACIDVEN" in the last 168 hours.  No results found for this or any previous visit (from the past 240 hour(s)).       Radiology Studies: CT HEAD WO CONTRAST ( )  Result Date: 06/24/2022 CLINICAL DATA:  Initial evaluation for mental  status change, headache. EXAM: CT HEAD WITHOUT CONTRAST TECHNIQUE: Contiguous axial images were obtained from the base of the skull through the vertex without intravenous contrast. RADIATION DOSE REDUCTION: This exam was performed according to the departmental dose-optimization program which includes automated exposure control, adjustment of the mA and/or kV according to patient size and/or use of iterative reconstruction technique. COMPARISON:  Prior study from 06/21/2022. FINDINGS: Brain: Age-related cerebral atrophy with mild chronic small vessel ischemic disease. No acute intracranial hemorrhage. No acute large vessel territory infarct. No mass lesion or midline shift. No hydrocephalus or extra-axial fluid collection. Vascular: No abnormal hyperdense vessel. Scattered calcified atherosclerosis present at the skull base. Skull: Scalp soft tissues demonstrate no acute finding. Calvarium intact. Sinuses/Orbits: Mild mucoperiosteal thickening present about the ethmoidal air cells and maxillary sinuses. Paranasal sinuses are otherwise largely clear. No significant mastoid effusion. Other: None. IMPRESSION: 1. No acute intracranial abnormality. 2. Age-related cerebral atrophy with mild chronic small vessel ischemic disease. Electronically Signed   By: Rise Mu M.D.   On: 06/24/2022 23:37        Scheduled Meds:  acetaminophen  1,000 mg Oral Q6H   amLODipine  10 mg Oral Q breakfast   calcitonin (salmon)  1 spray  Alternating Nares Daily   gabapentin  600 mg Oral QHS   heparin  5,000 Units Subcutaneous Q12H   levothyroxine  100 mcg Oral Q0600   lidocaine  1 patch Transdermal Q24H   metoprolol tartrate  25 mg Oral BID   pantoprazole  40 mg Oral Daily   rOPINIRole  0.25 mg Oral QHS   sodium chloride flush  3 mL Intravenous Q12H   Continuous Infusions:  sodium chloride 125 mL/hr at 06/25/22 0603   sodium chloride      ceFAZolin (ANCEF) IV Stopped (06/25/22 1037)     LOS: 3 days    Time  spent: 35 minutes    Dorcas Carrow, MD [facility] Pager [phone]

## 2022-06-26 DIAGNOSIS — S22050A Wedge compression fracture of T5-T6 vertebra, initial encounter for closed fracture: Secondary | ICD-10-CM | POA: Diagnosis not present

## 2022-06-26 DIAGNOSIS — E034 Atrophy of thyroid (acquired): Secondary | ICD-10-CM

## 2022-06-26 LAB — CBC WITH DIFFERENTIAL/PLATELET
Abs Immature Granulocytes: 0.05 10*3/uL (ref 0.00–0.07)
Basophils Absolute: 0 10*3/uL (ref 0.0–0.1)
Basophils Relative: 0 %
Eosinophils Absolute: 0.1 10*3/uL (ref 0.0–0.5)
Eosinophils Relative: 1 %
HCT: 35.6 % — ABNORMAL LOW (ref 36.0–46.0)
Hemoglobin: 11.8 g/dL — ABNORMAL LOW (ref 12.0–15.0)
Immature Granulocytes: 1 %
Lymphocytes Relative: 31 %
Lymphs Abs: 3 10*3/uL (ref 0.7–4.0)
MCH: 30.5 pg (ref 26.0–34.0)
MCHC: 33.1 g/dL (ref 30.0–36.0)
MCV: 92 fL (ref 80.0–100.0)
Monocytes Absolute: 1 10*3/uL (ref 0.1–1.0)
Monocytes Relative: 10 %
Neutro Abs: 5.6 10*3/uL (ref 1.7–7.7)
Neutrophils Relative %: 57 %
Platelets: 312 10*3/uL (ref 150–400)
RBC: 3.87 MIL/uL (ref 3.87–5.11)
RDW: 12 % (ref 11.5–15.5)
WBC: 9.8 10*3/uL (ref 4.0–10.5)
nRBC: 0 % (ref 0.0–0.2)

## 2022-06-26 LAB — BASIC METABOLIC PANEL
Anion gap: 7 (ref 5–15)
BUN: 16 mg/dL (ref 8–23)
CO2: 23 mmol/L (ref 22–32)
Calcium: 8.5 mg/dL — ABNORMAL LOW (ref 8.9–10.3)
Chloride: 99 mmol/L (ref 98–111)
Creatinine, Ser: 0.97 mg/dL (ref 0.44–1.00)
GFR, Estimated: 56 mL/min — ABNORMAL LOW (ref >60)
Glucose, Bld: 96 mg/dL (ref 70–99)
Potassium: 4.1 mmol/L (ref 3.5–5.1)
Sodium: 129 mmol/L — ABNORMAL LOW (ref 135–145)

## 2022-06-26 LAB — PHOSPHORUS: Phosphorus: 3.1 mg/dL (ref 2.5–4.6)

## 2022-06-26 LAB — MAGNESIUM: Magnesium: 1.5 mg/dL — ABNORMAL LOW (ref 1.7–2.4)

## 2022-06-26 MED ORDER — HYDROCODONE-ACETAMINOPHEN 10-325 MG PO TABS: 1.0000 | ORAL_TABLET | Freq: Four times a day (QID) | ORAL | 0 refills | Status: DC | PRN

## 2022-06-26 MED ORDER — LIDOCAINE 5 % EX PTCH: 1.0000 | MEDICATED_PATCH | CUTANEOUS | 0 refills | Status: DC

## 2022-06-26 MED ORDER — MAGNESIUM SULFATE 2 GM/50ML IV SOLN
2.0000 g | Freq: Once | INTRAVENOUS | Status: AC
  Administered 2022-06-26: 2 g via INTRAVENOUS
  Filled 2022-06-26: qty 50

## 2022-06-26 NOTE — NC FL2 (Signed)
[place] MEDICAID FL2 LEVEL OF CARE FORM     IDENTIFICATION  Patient Name: Theresa Snyder Birthdate: 03-27-1929 Sex: female Admission Date (Current Location): 06/21/2022  [place] and [place] Number:  [address] [id number] K Facility and Address:  [facility], [address] [place], [place] [address]      Provider Number: [id number]  Attending Physician Name and Address:  Dorcas Carrow, MD  Relative Name and Phone Number:  Burman Freestone [phone]    Current Level of Care: Hospital Recommended Level of Care: Skilled Nursing Facility Prior Approval Number:    Date Approved/Denied:   PASRR Number: [id number]  Discharge Plan: SNF    Current Diagnoses: Patient Active Problem List   Diagnosis Date Noted   Compression fracture of T6 vertebra (HCC) 06/23/2022   Dementia without behavioral disturbance (HCC) 06/22/2022   Hyponatremia 06/21/2022   Left hip pain 06/21/2022   Closed fracture of right thumb 12/21/2021   Hypomagnesemia 12/21/2021   Sinus tachycardia 12/21/2021   Atrial fibrillation with RVR (HCC) 12/21/2021   Acute metabolic encephalopathy 10/31/2021   UTI (urinary tract infection) 10/31/2021   Dehydration 10/31/2021   Chronic diastolic CHF (congestive heart failure) (HCC) 10/31/2021   Multifocal pneumonia 08/13/2021   Acute on chronic diastolic CHF (congestive heart failure) (HCC) 08/13/2021   Neuropathic pain 08/13/2021   Leukocytosis 08/13/2021   Pneumonia 08/13/2021   Acute dyspnea 08/12/2021   Lymphedema of leg    Hip fracture (HCC) 07/09/2021   Colonic obstruction (HCC) 05/21/2021   Hiatal hernia 05/21/2021   Severe sepsis with lactic acidosis (HCC) 05/21/2021   Lumbar compression fracture (HCC) 03/24/2020   Fall at home, initial encounter 03/23/2020   L4 vertebral fracture (HCC) 03/23/2020   Chronic kidney disease 03/04/2015   Adaptive colitis 03/04/2015   BP (high blood pressure) 05/18/2013   Hypothyroidism  05/18/2013   Hyperlipidemia 05/18/2013    Orientation RESPIRATION BLADDER Height & Weight     Self  Normal Incontinent Weight: 63.9 kg Height:  5' (152.4 cm)  BEHAVIORAL SYMPTOMS/MOOD NEUROLOGICAL BOWEL NUTRITION STATUS    Convulsions/Seizures Incontinent  (See Discharge Summary)  AMBULATORY STATUS COMMUNICATION OF NEEDS Skin   Extensive Assist Verbally Normal                       Personal Care Assistance Level of Assistance  Bathing, Feeding, Dressing Bathing Assistance: Maximum assistance Feeding assistance: Maximum assistance Dressing Assistance: Maximum assistance     Functional Limitations Info  Sight Sight Info: Impaired        SPECIAL CARE FACTORS FREQUENCY  PT (By licensed PT), OT (By licensed OT)     PT Frequency: 5x Weekly OT Frequency: 5x Weekly            Contractures Contractures Info: Not present    Additional Factors Info  Code Status, Allergies Code Status Info: Full Allergies Info: Iodinated Contrast Media, Lorazepam, Risperidone, Sulfamethoxazole-trimethoprim           Current Medications (06/26/2022):  This is the current hospital active medication list Current Facility-Administered Medications  Medication Dose Route Frequency Provider Last Rate Last Admin   0.9 %  sodium chloride infusion   Intravenous Continuous Foust, Katy L, NP 125 mL/hr at 06/25/22 2152 New Bag at 06/25/22 2152   0.9 %  sodium chloride infusion   Intravenous Continuous Hoyt Koch, PA       acetaminophen (TYLENOL) tablet 1,000 mg  1,000 mg Oral Q6H Dorcas Carrow, MD   1,000 mg  at 06/26/22 1132   albuterol (PROVENTIL) (2.5 MG/3ML) 0.083% nebulizer solution 2.5 mg  2.5 mg Nebulization Q6H PRN Gertha Calkin, MD       amLODipine (NORVASC) tablet 10 mg  10 mg Oral Q breakfast Alford Highland, MD   10 mg at 06/26/22 1013   calcitonin (salmon) (MIACALCIN/FORTICAL) nasal spray 1 spray  1 spray Alternating Nares Daily Alford Highland, MD   1 spray at  06/26/22 1014   gabapentin (NEURONTIN) capsule 600 mg  600 mg Oral QHS Alford Highland, MD   600 mg at 06/25/22 2144   heparin injection 5,000 Units  5,000 Units Subcutaneous Q12H Gertha Calkin, MD   5,000 Units at 06/26/22 1013   levothyroxine (SYNTHROID) tablet 100 mcg  100 mcg Oral Q0600 Gertha Calkin, MD   100 mcg at 06/26/22 0529   lidocaine (LIDODERM) 5 % 1 patch  1 patch Transdermal Q24H Dorcas Carrow, MD   1 patch at 06/26/22 1018   metoprolol tartrate (LOPRESSOR) tablet 25 mg  25 mg Oral BID Gertha Calkin, MD   25 mg at 06/26/22 1013   oxyCODONE (Oxy IR/ROXICODONE) immediate release tablet 5 mg  5 mg Oral Q4H PRN Dorcas Carrow, MD       pantoprazole (PROTONIX) EC tablet 40 mg  40 mg Oral Daily Alford Highland, MD   40 mg at 06/26/22 1018   rOPINIRole (REQUIP) tablet 0.25 mg  0.25 mg Oral QHS Dorcas Carrow, MD   0.25 mg at 06/25/22 2147   sodium chloride flush (NS) 0.9 % injection 3 mL  3 mL Intravenous Q12H Irena Cords V, MD   3 mL at 06/26/22 1018   traZODone (DESYREL) tablet 25 mg  25 mg Oral QHS PRN Alford Highland, MD   25 mg at 06/23/22 2130     Discharge Medications: Please see discharge summary for a list of discharge medications.  Relevant Imaging Results:  Relevant Lab Results:   Additional Information [id number]  Garret Reddish, RN

## 2022-06-26 NOTE — Progress Notes (Signed)
Patient discharging to [facility], report given to [facility]. Awaiting EMS pickup.

## 2022-06-26 NOTE — Progress Notes (Signed)
Physical Therapy Treatment Patient Details Name: Theresa Snyder MRN: [id number] DOB: [birth year] Today's Date: 06/26/2022   History of Present Illness Theresa Snyder is a 89yoF comes to [facility] for complaints of SOB symptoms. History includes HLD, HTN, and seizures. Imaging revealing of T6 compression fracture, PMh prior fractures, known osteoporosis. Pt resides LTC at [facility].    PT Comments    Pt asleep on entry but agreeable to mobility. Pt having pain, suspect from sleeping positions overnight. ModA bed mobility for pain modulation. Transfers and AMB at supervision level except for low toilet height. Pt needs intermittent minA to turn RW. AMB distance up to 177ft looked consistent in pacing, but pt c/o dizziness for latter half. Pt assisted back to bed at end of session.     Recommendations for follow up therapy are one component of a multi-disciplinary discharge planning process, led by the attending physician.  Recommendations may be updated based on patient status, additional functional criteria and insurance authorization.  Follow Up Recommendations       Assistance Recommended at Discharge Intermittent Supervision/Assistance  Patient can return home with the following A little help with walking and/or transfers;A little help with bathing/dressing/bathroom;Assist for transportation   Equipment Recommendations  None recommended by PT    Recommendations for Other Services       Precautions / Restrictions Precautions Precautions: Fall     Mobility  Bed Mobility   Bed Mobility: Supine to Sit, Sit to Supine     Supine to sit: Mod assist Sit to supine: Mod assist   General bed mobility comments: stiff this morning, assisted for pain control, previously needed much less help    Transfers Overall transfer level: Needs assistance Equipment used: Rolling walker (2 wheels) Transfers: Sit to/from Stand Sit to Stand: Supervision           General transfer  comment: minA from low toilet    Ambulation/Gait Ambulation/Gait assistance: Min assist Gait Distance (Feet): 130 Feet Assistive device: Rolling walker (2 wheels) Gait Pattern/deviations: Step-to pattern       General Gait Details: help turning walker sometimes; dizzy throughout   Stairs             Wheelchair Mobility    Modified Rankin (Stroke Patients Only)       Balance                                            Cognition Arousal/Alertness: Awake/alert Behavior During Therapy: WFL for tasks assessed/performed Overall Cognitive Status: History of cognitive impairments - at baseline                                          Exercises      General Comments        Pertinent Vitals/Pain Pain Assessment Pain Assessment: 0-10 Pain Score: 4  Pain Location: shoulder blades Pain Descriptors / Indicators: Aching Pain Intervention(s): Limited activity within patient's tolerance, Monitored during session, Premedicated before session    Home Living                          Prior Function            PT Goals (current goals can now be found in the  care plan section) Acute Rehab PT Goals Patient Stated Goal: to go home PT Goal Formulation: With patient Time For Goal Achievement: 07/06/22 Potential to Achieve Goals: Good Progress towards PT goals: Progressing toward goals    Frequency    Min 2X/week      PT Plan Current plan remains appropriate    Co-evaluation              AM-PAC PT "6 Clicks" Mobility   Outcome Measure  Help needed turning from your back to your side while in a flat bed without using bedrails?: None Help needed moving from lying on your back to sitting on the side of a flat bed without using bedrails?: None Help needed moving to and from a bed to a chair (including a wheelchair)?: A Little Help needed standing up from a chair using your arms (e.g., wheelchair or bedside  chair)?: A Little Help needed to walk in hospital room?: A Little Help needed climbing 3-5 steps with a railing? : A Little 6 Click Score: 20    End of Session Equipment Utilized During Treatment: Gait belt Activity Tolerance: Patient tolerated treatment well;No increased pain Patient left: in bed;with bed alarm set Nurse Communication: Mobility status PT Visit Diagnosis: Difficulty in walking, not elsewhere classified (R26.2);Muscle weakness (generalized) (M62.81);Unsteadiness on feet (R26.81);Pain     Time: 0737-0801 PT Time Calculation (min) (ACUTE ONLY): 24 min  Charges:  $Therapeutic Activity: 23-37 mins                    8:07 AM, 06/26/22 Rosamaria Lints, PT, DPT Physical Therapist - [facility]  [phone] (ASCOM)    Porchia Sinkler C 06/26/2022, 8:06 AM

## 2022-06-26 NOTE — Discharge Summary (Signed)
Physician Discharge Summary  Theresa Snyder ZOX:[id number] DOB: 08-26-1929 DOA: 06/21/2022  PCP: [facility]  Admit date: 06/21/2022 Discharge date: 06/26/2022  Admitted From: Long-term care  Disposition: Long-term care, skilled nursing facility  Recommendations for Outpatient Follow-up:  Follow up with PCP in 1-2 weeks Schedule follow-up with interventional radiology if persistent or severe pain.  Discharge Condition: Fair CODE STATUS: Full code Diet recommendation: Low-salt diet  Discharge summary: 89 year old with previous history of hypertension, CKD stage IIIb, hyperlipidemia, hypothyroidism and dementia from [facility] presented with back pain and found to have T6 compression fracture with 60% vertebral body height loss.  Admitted due to significant pain issues.  Seen by neurology.  Recommended IR for kyphoplasty, however patient started developing delirium so this was canceled. Occasionally difficult to control pain, however using Norco that she uses as long-term. Because of patient's labile mood and risk of delirium at this time, decided to avoid sedation or anesthesia and procedure as today her pain is managed with oral pain medications.  Assessment & Plan:   Acute back pain secondary to compression fracture of T6 vertebrae: Neurosurgery and interventional radiology has been following patient in the hospital. Recommended kyphoplasty, however patient was initially hesitant and developed delirium. Today her pain is controlled. Continue Tylenol, lidocaine patches, Norco 10 mg every 6 hours as needed. Kyphoplasty, sedation anesthesia postponed due to high risk of complications including delirium. If intractable pain recurs, benefits will outweigh risks so it is reasonable to follow-up with IR.   Hospital-acquired delirium in a patient with dementia: Long-term nursing home resident.  Memory deficits at baseline but functional. No evidence of  infection, urinalysis was normal. Symptoms improved. Regular day and night cycle.  Transition to familiar environment.   Hypochloremic hyponatremia: Probably related to poor oral intake.  Treated with isotonic fluid with appropriate improvement.  Sodium is 129 today.  Encourage oral intake, encourage nutritious food.   Hypokalemia: Replaced and adequate.  Magnesium was replaced before discharge.   Chronic medical issues including Hypothyroidism, on Synthroid Dementia without behavioral disturbances, trazodone for sleep.   Chronic diastolic dysfunction, euvolemic.  Not on any diuretics. Hiatal hernia and GERD, on PPI CKD stage IIIb, at about baseline.  Patient is fairly stable today.  Pain is managed with Tylenol and Norco.  She mobilized in the hallway.  Delirium has cleared up. Detailed discussion was held regarding high risk of postop delirium and interventional radiology postpone the procedure.  At this time, we will discharge her back to her familiar environment at skilled nursing facility and will ask IR to follow-up.   Discharge Diagnoses:  Principal Problem:   Compression fracture of T6 vertebra (HCC) Active Problems:   Hyponatremia   Hypothyroidism   Chronic kidney disease   Hiatal hernia   Chronic diastolic CHF (congestive heart failure) (HCC)   Left hip pain   Dementia without behavioral disturbance [facility])    Discharge Instructions  Discharge Instructions     Call MD for:  severe uncontrolled pain   Complete by: As directed    Diet - low sodium heart healthy   Complete by: As directed    Increase activity slowly   Complete by: As directed       Allergies as of 06/26/2022       Reactions   Iodinated Contrast Media Other (See Comments)   Blue, Green and Red dyes    Lorazepam    Altered mental state   Risperidone Other (See Comments)   Altered  mental state    Sulfamethoxazole-trimethoprim    Hyper        Medication List     TAKE these medications     albuterol 108 (90 Base) MCG/ACT inhaler Commonly known as: VENTOLIN HFA Inhale 2 puffs into the lungs every 6 (six) hours as needed for wheezing or shortness of breath.   amLODipine 10 MG tablet Commonly known as: NORVASC Take 10 mg by mouth daily with breakfast. for high blood pressure   cholecalciferol 25 MCG (1000 UNIT) tablet Commonly known as: VITAMIN D3 Take 1,000 Units by mouth daily.   diclofenac Sodium 1 % Gel Commonly known as: VOLTAREN Apply 2 g topically 4 (four) times daily.   docusate sodium 100 MG capsule Commonly known as: COLACE Take 1 capsule (100 mg total) by mouth 2 (two) times daily as needed for mild constipation.   furosemide 20 MG tablet Commonly known as: Lasix Take 1 tablet (20 mg total) by mouth daily as needed for fluid or edema.   gabapentin 300 MG capsule Commonly known as: NEURONTIN Take 2 capsules (600 mg total) by mouth at bedtime. What changed: additional instructions   HYDROcodone-acetaminophen 10-325 MG tablet Commonly known as: NORCO Take 1 tablet by mouth every 6 (six) hours as needed for up to 5 days. 6-8 hours as needed.  SNF use only.  Refills per SNF MD/DO.   levothyroxine 100 MCG tablet Commonly known as: SYNTHROID Take 100 mcg by mouth daily.   lidocaine 5 % Commonly known as: LIDODERM Place 1 patch onto the skin daily. Remove & Discard patch within 12 hours or as directed by MD Start taking on: Jun 27, 2022   loperamide 2 MG capsule Commonly known as: IMODIUM Take 2 capsules (4 mg total) by mouth as needed for diarrhea or loose stools.   metoprolol tartrate 25 MG tablet Commonly known as: LOPRESSOR Take 1 tablet (25 mg total) by mouth 2 (two) times daily.   multivitamin with minerals Tabs tablet Take 1 tablet by mouth daily.   pravastatin 20 MG tablet Commonly known as: PRAVACHOL Take 20 mg by mouth at bedtime.   rOPINIRole 0.25 MG tablet Commonly known as: REQUIP Take 0.25 mg by mouth at bedtime.    senna-docusate 8.6-50 MG tablet Commonly known as: Senokot-S Take 1 tablet by mouth daily.   Tylenol 325 MG tablet Generic drug: acetaminophen Take 650 mg by mouth every 6 (six) hours as needed for mild pain.        Follow-up Information     Susanne Borders, PA Follow up on 07/07/2022.   Specialty: Neurosurgery Contact information: [address] [place] [address] [phone]                Allergies  Allergen Reactions   Iodinated Contrast Media Other (See Comments)    Blue, Green and Red dyes    Lorazepam     Altered mental state   Risperidone Other (See Comments)    Altered mental state    Sulfamethoxazole-Trimethoprim     Hyper    Consultations: Interventional radiology Neurosurgery   Procedures/Studies: CT HEAD WO CONTRAST ( )  Result Date: 06/24/2022 CLINICAL DATA:  Initial evaluation for mental status change, headache. EXAM: CT HEAD WITHOUT CONTRAST TECHNIQUE: Contiguous axial images were obtained from the base of the skull through the vertex without intravenous contrast. RADIATION DOSE REDUCTION: This exam was performed according to the departmental dose-optimization program which includes automated exposure control, adjustment of the mA and/or kV  according to patient size and/or use of iterative reconstruction technique. COMPARISON:  Prior study from 06/21/2022. FINDINGS: Brain: Age-related cerebral atrophy with mild chronic small vessel ischemic disease. No acute intracranial hemorrhage. No acute large vessel territory infarct. No mass lesion or midline shift. No hydrocephalus or extra-axial fluid collection. Vascular: No abnormal hyperdense vessel. Scattered calcified atherosclerosis present at the skull base. Skull: Scalp soft tissues demonstrate no acute finding. Calvarium intact. Sinuses/Orbits: Mild mucoperiosteal thickening present about the ethmoidal air cells and maxillary sinuses. Paranasal sinuses are otherwise  largely clear. No significant mastoid effusion. Other: None. IMPRESSION: 1. No acute intracranial abnormality. 2. Age-related cerebral atrophy with mild chronic small vessel ischemic disease. Electronically Signed   By: Rise Mu M.D.   On: 06/24/2022 23:37   MR THORACIC SPINE WO CONTRAST  Result Date: 06/22/2022 CLINICAL DATA:  Mid back pain EXAM: MRI THORACIC SPINE WITHOUT CONTRAST TECHNIQUE: Multiplanar, multisequence MR imaging of the thoracic spine was performed. No intravenous contrast was administered. COMPARISON:  No prior MRI of the thoracic spine, correlation is made with 06/22/2022 thoracic spine radiograph, MRI lumbar spine 01/26/2022, and 08/12/2021 CTA chest FINDINGS: Evaluation is limited by motion and early termination of the study after the axial T2 sequence was obtained. The axial T2 sequence is essentially nondiagnostic secondary to motion. Alignment: Exaggeration of the normal thoracic kyphosis. No significant listhesis in the thoracic spine. Vertebrae: Decreased T1 and T2 signal in the T6 vertebral body (series 18, image 5 and series 19, image 9), with increased T2 signal along the inferior aspect seen on the STIR sequence (series 20, image 8). Approximately 60% vertebral body height loss anteriorly and 30% vertebral body height loss posteriorly, with 3 mm retropulsion the posterior cortex (series 16, image 5). No other significant vertebral body height loss thoracic spine. Mild wedging of T11 and T12 appears unchanged from the prior lumbar spine MRI. Partially imaged lumbar spine demonstrates remote fractures at L3 and L4, which appears similar to 01/26/2022. Cord:  Unable to evaluate given the sequences obtained. Paraspinal and other soft tissues: No acute finding. Disc levels: Evaluation is limited, but no high-grade spinal canal stenosis is seen. IMPRESSION: Evaluation is limited by motion and early termination of the study after the axial T2 sequence was obtained. Within this  limitation, there is a compression fracture of the T6 vertebral body, with approximately 60% vertebral body height loss anteriorly and 30% vertebral body height loss posteriorly, with 3 mm retropulsion of the posterior cortex. This may be acute, subacute, or acute on subacute. No significant spinal canal stenosis. Electronically Signed   By: Wiliam Ke M.D.   On: 06/22/2022 17:27   DG Thoracic Spine 2 View  Result Date: 06/22/2022 CLINICAL DATA:  Back pain EXAM: THORACIC SPINE 2 VIEWS COMPARISON:  Lumbar spine radiograph done on 03/03/2022, CT chest done on 08/12/2021 FINDINGS: No recent fracture is seen. Osteopenia is seen in bony structures. There is moderate decrease in height of body of T6 vertebra. There is mild decrease in height of lower thoracic vertebral bodies, T11, T12 and L1 vertebrae. As far as seen, alignment of posterior margins of vertebral bodies is within normal limits. There is large hiatal hernia containing portion of transverse colon and stomach. IMPRESSION: There is moderate decrease in height of body of T6 vertebra. This finding was not evident in the CT chest done on 08/12/2021. If clinically warranted, follow-up CT or MRI may be considered. There is mild decrease in height of bodies of T11, T12 and L1 vertebrae  with no definite interval change. Electronically Signed   By: Ernie Avena M.D.   On: 06/22/2022 15:14   DG HIPS BILAT WITH PELVIS MIN 5 VIEWS  Result Date: 06/21/2022 CLINICAL DATA:  [id number] Hip pain, acute [id number] EXAM: DG HIP (WITH OR WITHOUT PELVIS) 5+V BILAT COMPARISON:  None Available. FINDINGS: Total left hip arthroplasty. No radiographic finding of surgical hardware complication. There is no evidence of hip fracture or dislocation of the right hip. No acute displaced fracture or diastasis of the bones of the pelvis. There is no evidence of severe arthropathy or other focal bone abnormality. Visualized lower lumbar spine demonstrates degenerative changes.  IMPRESSION: Negative for acute traumatic injury in a patient status post total left hip arthroplasty. Electronically Signed   By: Tish Frederickson M.D.   On: 06/21/2022 23:34   CT Head Wo Contrast  Result Date: 06/21/2022 CLINICAL DATA:  Headaches and recent pneumonia diagnosis, initial encounter EXAM: CT HEAD WITHOUT CONTRAST TECHNIQUE: Contiguous axial images were obtained from the base of the skull through the vertex without intravenous contrast. RADIATION DOSE REDUCTION: This exam was performed according to the departmental dose-optimization program which includes automated exposure control, adjustment of the mA and/or kV according to patient size and/or use of iterative reconstruction technique. COMPARISON:  10/31/2021 FINDINGS: Brain: No evidence of acute infarction, hemorrhage, hydrocephalus, extra-axial collection or mass lesion/mass effect. Mild atrophic changes are noted. Chronic white matter ischemic changes are seen. Vascular: No hyperdense vessel or unexpected calcification. Skull: Normal. Negative for fracture or focal lesion. Sinuses/Orbits: No acute finding. Other: None. IMPRESSION: Chronic atrophic and ischemic changes are noted. No acute abnormality noted. Electronically Signed   By: Alcide Clever M.D.   On: 06/21/2022 18:33   DG Chest 2 View  Result Date: 06/21/2022 CLINICAL DATA:  Chest pain with shortness of breath EXAM: CHEST - 2 VIEW COMPARISON:  Chest x-ray 12/20/2021 FINDINGS: Large hiatal hernia is again seen. The cardiomediastinal silhouette is otherwise within normal limits. There is no definite focal lung infiltrate, pleural effusion or pneumothorax. No acute fractures are seen. There are chronic right rib fractures. IMPRESSION: 1. No acute cardiopulmonary process. 2. Large hiatal hernia. Electronically Signed   By: Darliss Cheney M.D.   On: 06/21/2022 17:34   (Echo, Carotid, EGD, Colonoscopy, ERCP)    Subjective: Patient seen and examined.  No overnight events.  Her daughter  stayed with her most of the time.  Patient is alert awake.  She walked in the hallway with minimal support from a rolling walker.  She tells me she still has some back pain but she can manage it.   Discharge Exam: Vitals:   06/26/22 0803 06/26/22 1013  BP:    Pulse:  62  Resp:    Temp:    SpO2: 93%    Vitals:   06/26/22 0000 06/26/22 0425 06/26/22 0803 06/26/22 1013  BP:      Pulse: (!) 56   62  Resp:      Temp:      TempSrc:      SpO2:   93%   Weight:  63.9 kg    Height:        General: Pt is alert, awake, not in acute distress, frail  Patient is alert awake.  She is oriented to herself and situation.  Not oriented to time. Cardiovascular: RRR, S1/S2 +, no rubs, no gallops Respiratory: CTA bilaterally, no wheezing, no rhonchi Abdominal: Soft, NT, ND, bowel sounds + Extremities: no edema, no cyanosis  The results of significant diagnostics from this hospitalization (including imaging, microbiology, ancillary and laboratory) are listed below for reference.     Microbiology: No results found for this or any previous visit (from the past 240 hour(s)).   Labs: BNP (last 3 results) Recent Labs    08/12/21 1513  BNP 658.8*   Basic Metabolic Panel: Recent Labs  Lab 06/23/22 0454 06/24/22 0619 06/24/22 2325 06/25/22 0626 06/26/22 0515  NA 127* 125* 125* 126* 129*  K 4.1 3.8 3.5 3.3* 4.1  CL 91* 91* 91* 93* 99  CO2 28 24 25 24 23   GLUCOSE 122* 114* 131* 92 96  BUN 23 22 23 20 16   CREATININE 1.05* 1.03* 1.02* 1.00 0.97  CALCIUM 9.3 9.1 8.5* 8.3* 8.5*  MG  --   --   --   --  1.5*  PHOS  --   --   --   --  3.1   Liver Function Tests: Recent Labs  Lab 06/22/22 0348  AST 30  ALT 17  ALKPHOS 97  BILITOT 0.6  PROT 6.8  ALBUMIN 3.8   No results for input(s): "LIPASE", "AMYLASE" in the last 168 hours. No results for input(s): "AMMONIA" in the last 168 hours. CBC: Recent Labs  Lab 06/23/22 0454 06/24/22 0619 06/24/22 2325 06/25/22 0948 06/26/22 0515   WBC 11.1* 12.9* 9.7 9.6 9.8  NEUTROABS  --   --   --   --  5.6  HGB 12.2 11.9* 11.1* 12.7 11.8*  HCT 35.5* 35.0* 32.5* 37.0 35.6*  MCV 89.6 89.5 89.3 88.9 92.0  PLT 317 312 301 304 312   Cardiac Enzymes: No results for input(s): "CKTOTAL", "CKMB", "CKMBINDEX", "TROPONINI" in the last 168 hours. BNP: Invalid input(s): "POCBNP" CBG: Recent Labs  Lab 06/24/22 2238  GLUCAP 137*   D-Dimer No results for input(s): "DDIMER" in the last 72 hours. Hgb A1c No results for input(s): "HGBA1C" in the last 72 hours. Lipid Profile No results for input(s): "CHOL", "HDL", "LDLCALC", "TRIG", "CHOLHDL", "LDLDIRECT" in the last 72 hours. Thyroid function studies No results for input(s): "TSH", "T4TOTAL", "T3FREE", "THYROIDAB" in the last 72 hours.  Invalid input(s): "FREET3" Anemia work up No results for input(s): "VITAMINB12", "FOLATE", "FERRITIN", "TIBC", "IRON", "RETICCTPCT" in the last 72 hours. Urinalysis    Component Value Date/Time   COLORURINE YELLOW (A) 06/25/2022 0015   APPEARANCEUR CLEAR (A) 06/25/2022 0015   APPEARANCEUR Clear 11/21/2012 2330   LABSPEC 1.016 06/25/2022 0015   LABSPEC 1.004 11/21/2012 2330   PHURINE 5.0 06/25/2022 0015   GLUCOSEU NEGATIVE 06/25/2022 0015   GLUCOSEU Negative 11/21/2012 2330   HGBUR NEGATIVE 06/25/2022 0015   BILIRUBINUR NEGATIVE 06/25/2022 0015   BILIRUBINUR Negative 11/21/2012 2330   KETONESUR NEGATIVE 06/25/2022 0015   PROTEINUR 30 (A) 06/25/2022 0015   NITRITE NEGATIVE 06/25/2022 0015   LEUKOCYTESUR NEGATIVE 06/25/2022 0015   LEUKOCYTESUR Negative 11/21/2012 2330   Sepsis Labs Recent Labs  Lab 06/24/22 0619 06/24/22 2325 06/25/22 0948 06/26/22 0515  WBC 12.9* 9.7 9.6 9.8   Microbiology No results found for this or any previous visit (from the past 240 hour(s)).   Time coordinating discharge: 35 minutes  SIGNED:   Dorcas Carrow, MD  [facility] 06/26/2022, 12:20 PM

## 2022-06-26 NOTE — Progress Notes (Signed)
Patient approved by Dr. Juliette Alcide for T6 kyphoplasty. Procedure tentatively scheduled for 06/25/22 but due to delirium the procedure was delayed. The patient has stabilized and is now being discharged to SNF. IR asked to place outpatient KP order.  Order has been placed. A scheduler from our office will call the patient with a date/time of her appointment.  Alwyn Ren, [place] [phone] 06/26/2022, 3:40 PM

## 2022-06-26 NOTE — TOC Transition Note (Signed)
Transition of Care [facility]) - CM/SW Discharge Note   Patient Details  Name: Theresa Snyder MRN: [id number] Date of Birth: 07-03-1929  [facility] CM/SW Contact:  Garret Reddish, RN Phone Number: 06/26/2022, 1:12 PM   Clinical Narrative:  Chart reviewed.  I have spoken with Medical Provider and patient is able to return to [facility] today.  I have spoken with patient's daughter Theresa Snyder and she is in agreeable for patient to return back to [facility] today.  I have spoken with Tiffany, Admission Coordinator at [facility]. She reports that patient is able to return today.  Tiffany reports that patient will go to room 211 B and the number to call report is [phone].    I have asked [facility] to transport patient to facility today.    I have sent Tiffany patient's SNF packet, Discharge Summary, Fl2 and Discharge orders.   I have informed staff nurse of above information.       Final next level of care: Skilled Nursing Facility Barriers to Discharge: No Barriers Identified   Patient Goals and CMS Choice   Choice offered to / list presented to : Adult Children  Discharge Placement                Patient chooses bed at:  (Patient at [facility] and daughter would like for patient to return to the facility) Patient to be transferred to facility by: [facility] Name of family member notified: Theresa Snyder Patient and family notified of of transfer: 06/26/22  Discharge Plan and Services Additional resources added to the After Visit Summary for                                       Social Determinants of Health (SDOH) Interventions SDOH Screenings   Food Insecurity: No Food Insecurity (06/22/2022)  Housing: Low Risk  (06/22/2022)  Transportation Needs: No Transportation Needs (06/22/2022)  Utilities: Not At Risk (06/22/2022)  Tobacco Use: Low Risk  (06/23/2022)     Readmission Risk Interventions    12/22/2021     4:06 PM  Readmission Risk Prevention Plan  Transportation Screening Complete  Medication Review (RN Care Manager) Referral to Pharmacy  PCP or Specialist appointment within 3-5 days of discharge Complete  HRI or Home Care Consult Complete  SW Recovery Care/Counseling Consult Complete  Palliative Care Screening Not Applicable  Skilled Nursing Facility Complete

## 2022-06-28 ENCOUNTER — Other Ambulatory Visit: Payer: Self-pay

## 2022-06-28 ENCOUNTER — Emergency Department: Payer: Medicare (Managed Care)

## 2022-06-28 DIAGNOSIS — Z9102 Food additives allergy status: Secondary | ICD-10-CM

## 2022-06-28 DIAGNOSIS — E039 Hypothyroidism, unspecified: Secondary | ICD-10-CM | POA: Diagnosis present

## 2022-06-28 DIAGNOSIS — M549 Dorsalgia, unspecified: Secondary | ICD-10-CM | POA: Diagnosis present

## 2022-06-28 DIAGNOSIS — Z96642 Presence of left artificial hip joint: Secondary | ICD-10-CM | POA: Diagnosis present

## 2022-06-28 DIAGNOSIS — Z91041 Radiographic dye allergy status: Secondary | ICD-10-CM

## 2022-06-28 DIAGNOSIS — Z8249 Family history of ischemic heart disease and other diseases of the circulatory system: Secondary | ICD-10-CM

## 2022-06-28 DIAGNOSIS — J9601 Acute respiratory failure with hypoxia: Secondary | ICD-10-CM | POA: Diagnosis present

## 2022-06-28 DIAGNOSIS — X58XXXA Exposure to other specified factors, initial encounter: Secondary | ICD-10-CM | POA: Diagnosis present

## 2022-06-28 DIAGNOSIS — Z888 Allergy status to other drugs, medicaments and biological substances status: Secondary | ICD-10-CM

## 2022-06-28 DIAGNOSIS — N1831 Chronic kidney disease, stage 3a: Secondary | ICD-10-CM | POA: Diagnosis present

## 2022-06-28 DIAGNOSIS — S2232XA Fracture of one rib, left side, initial encounter for closed fracture: Secondary | ICD-10-CM | POA: Diagnosis present

## 2022-06-28 DIAGNOSIS — K449 Diaphragmatic hernia without obstruction or gangrene: Secondary | ICD-10-CM | POA: Diagnosis not present

## 2022-06-28 DIAGNOSIS — Z66 Do not resuscitate: Secondary | ICD-10-CM | POA: Diagnosis present

## 2022-06-28 DIAGNOSIS — Z79890 Hormone replacement therapy (postmenopausal): Secondary | ICD-10-CM

## 2022-06-28 DIAGNOSIS — Z882 Allergy status to sulfonamides status: Secondary | ICD-10-CM

## 2022-06-28 DIAGNOSIS — F0280 Dementia in other diseases classified elsewhere without behavioral disturbance: Secondary | ICD-10-CM | POA: Diagnosis present

## 2022-06-28 DIAGNOSIS — Z823 Family history of stroke: Secondary | ICD-10-CM

## 2022-06-28 DIAGNOSIS — I130 Hypertensive heart and chronic kidney disease with heart failure and stage 1 through stage 4 chronic kidney disease, or unspecified chronic kidney disease: Secondary | ICD-10-CM | POA: Diagnosis present

## 2022-06-28 DIAGNOSIS — G8929 Other chronic pain: Secondary | ICD-10-CM | POA: Diagnosis present

## 2022-06-28 DIAGNOSIS — E7800 Pure hypercholesterolemia, unspecified: Secondary | ICD-10-CM | POA: Diagnosis present

## 2022-06-28 DIAGNOSIS — E871 Hypo-osmolality and hyponatremia: Secondary | ICD-10-CM | POA: Diagnosis present

## 2022-06-28 DIAGNOSIS — G309 Alzheimer's disease, unspecified: Secondary | ICD-10-CM | POA: Diagnosis present

## 2022-06-28 DIAGNOSIS — E860 Dehydration: Secondary | ICD-10-CM | POA: Diagnosis present

## 2022-06-28 DIAGNOSIS — Z79899 Other long term (current) drug therapy: Secondary | ICD-10-CM

## 2022-06-28 DIAGNOSIS — I5032 Chronic diastolic (congestive) heart failure: Secondary | ICD-10-CM | POA: Diagnosis present

## 2022-06-28 DIAGNOSIS — Z515 Encounter for palliative care: Secondary | ICD-10-CM

## 2022-06-28 LAB — CBC
HCT: 42.2 % (ref 36.0–46.0)
Hemoglobin: 13.7 g/dL (ref 12.0–15.0)
MCH: 30.2 pg (ref 26.0–34.0)
MCHC: 32.5 g/dL (ref 30.0–36.0)
MCV: 93 fL (ref 80.0–100.0)
Platelets: 349 10*3/uL (ref 150–400)
RBC: 4.54 MIL/uL (ref 3.87–5.11)
RDW: 12.3 % (ref 11.5–15.5)
WBC: 10.7 10*3/uL — ABNORMAL HIGH (ref 4.0–10.5)
nRBC: 0 % (ref 0.0–0.2)

## 2022-06-28 LAB — TROPONIN I (HIGH SENSITIVITY): Troponin I (High Sensitivity): 5 ng/L (ref <18)

## 2022-06-28 LAB — COMPREHENSIVE METABOLIC PANEL
ALT: 15 U/L (ref 0–44)
AST: 24 U/L (ref 15–41)
Albumin: 3.8 g/dL (ref 3.5–5.0)
Alkaline Phosphatase: 116 U/L (ref 38–126)
Anion gap: 11 (ref 5–15)
BUN: 21 mg/dL (ref 8–23)
CO2: 23 mmol/L (ref 22–32)
Calcium: 8.9 mg/dL (ref 8.9–10.3)
Chloride: 92 mmol/L — ABNORMAL LOW (ref 98–111)
Creatinine, Ser: 1.27 mg/dL — ABNORMAL HIGH (ref 0.44–1.00)
GFR, Estimated: 40 mL/min — ABNORMAL LOW (ref >60)
Glucose, Bld: 159 mg/dL — ABNORMAL HIGH (ref 70–99)
Potassium: 4 mmol/L (ref 3.5–5.1)
Sodium: 126 mmol/L — ABNORMAL LOW (ref 135–145)
Total Bilirubin: 0.7 mg/dL (ref 0.3–1.2)
Total Protein: 7 g/dL (ref 6.5–8.1)

## 2022-06-28 LAB — LIPASE, BLOOD: Lipase: 74 U/L — ABNORMAL HIGH (ref 11–51)

## 2022-06-28 NOTE — ED Triage Notes (Signed)
Pt to ED from [facility] for abdominal pain. EMS report 99% on RA, however, pt is 86% on RA. Placed on [place] at this time. Was seen recently and diagnosed with pneumonia.  Pt is CAOx4 and in no acute distress.

## 2022-06-28 NOTE — ED Triage Notes (Signed)
FIRST NURSE NOTE:  pt arrived via [facility] with reports abd pain, pt was 99% on RA  Has memory loss, from [facility]  100/60 CBG 105 99% RA

## 2022-06-29 ENCOUNTER — Emergency Department: Payer: Medicare (Managed Care)

## 2022-06-29 ENCOUNTER — Inpatient Hospital Stay
Admission: EM | Admit: 2022-06-29 | Discharge: 2022-07-02 | DRG: 391 | Disposition: A | Discharge status: Skilled Nursing Facility | Payer: Medicare (Managed Care) | Source: Skilled Nursing Facility | Attending: Internal Medicine | Admitting: Internal Medicine

## 2022-06-29 ENCOUNTER — Encounter: Payer: Self-pay | Admitting: Family Medicine

## 2022-06-29 DIAGNOSIS — I5032 Chronic diastolic (congestive) heart failure: Secondary | ICD-10-CM | POA: Diagnosis present

## 2022-06-29 DIAGNOSIS — K440 Diaphragmatic hernia with obstruction, without gangrene: Secondary | ICD-10-CM

## 2022-06-29 DIAGNOSIS — M549 Dorsalgia, unspecified: Secondary | ICD-10-CM | POA: Diagnosis present

## 2022-06-29 DIAGNOSIS — Z888 Allergy status to other drugs, medicaments and biological substances status: Secondary | ICD-10-CM | POA: Diagnosis not present

## 2022-06-29 DIAGNOSIS — I10 Essential (primary) hypertension: Secondary | ICD-10-CM | POA: Diagnosis present

## 2022-06-29 DIAGNOSIS — F0390 Unspecified dementia without behavioral disturbance: Secondary | ICD-10-CM | POA: Diagnosis not present

## 2022-06-29 DIAGNOSIS — Z8249 Family history of ischemic heart disease and other diseases of the circulatory system: Secondary | ICD-10-CM | POA: Diagnosis not present

## 2022-06-29 DIAGNOSIS — J9601 Acute respiratory failure with hypoxia: Secondary | ICD-10-CM | POA: Diagnosis present

## 2022-06-29 DIAGNOSIS — Z66 Do not resuscitate: Secondary | ICD-10-CM | POA: Diagnosis not present

## 2022-06-29 DIAGNOSIS — S2232XA Fracture of one rib, left side, initial encounter for closed fracture: Secondary | ICD-10-CM | POA: Diagnosis present

## 2022-06-29 DIAGNOSIS — Z882 Allergy status to sulfonamides status: Secondary | ICD-10-CM | POA: Diagnosis not present

## 2022-06-29 DIAGNOSIS — E860 Dehydration: Secondary | ICD-10-CM | POA: Diagnosis present

## 2022-06-29 DIAGNOSIS — Z96642 Presence of left artificial hip joint: Secondary | ICD-10-CM | POA: Diagnosis present

## 2022-06-29 DIAGNOSIS — R0602 Shortness of breath: Secondary | ICD-10-CM | POA: Diagnosis not present

## 2022-06-29 DIAGNOSIS — Z7189 Other specified counseling: Secondary | ICD-10-CM | POA: Diagnosis not present

## 2022-06-29 DIAGNOSIS — G309 Alzheimer's disease, unspecified: Secondary | ICD-10-CM | POA: Diagnosis present

## 2022-06-29 DIAGNOSIS — K449 Diaphragmatic hernia without obstruction or gangrene: Principal | ICD-10-CM

## 2022-06-29 DIAGNOSIS — Z9102 Food additives allergy status: Secondary | ICD-10-CM | POA: Diagnosis not present

## 2022-06-29 DIAGNOSIS — Z91041 Radiographic dye allergy status: Secondary | ICD-10-CM | POA: Diagnosis not present

## 2022-06-29 DIAGNOSIS — N1831 Chronic kidney disease, stage 3a: Secondary | ICD-10-CM | POA: Diagnosis present

## 2022-06-29 DIAGNOSIS — K56609 Unspecified intestinal obstruction, unspecified as to partial versus complete obstruction: Principal | ICD-10-CM | POA: Diagnosis present

## 2022-06-29 DIAGNOSIS — I130 Hypertensive heart and chronic kidney disease with heart failure and stage 1 through stage 4 chronic kidney disease, or unspecified chronic kidney disease: Secondary | ICD-10-CM | POA: Diagnosis present

## 2022-06-29 DIAGNOSIS — Z515 Encounter for palliative care: Secondary | ICD-10-CM | POA: Diagnosis not present

## 2022-06-29 DIAGNOSIS — G8929 Other chronic pain: Secondary | ICD-10-CM | POA: Diagnosis present

## 2022-06-29 DIAGNOSIS — F0280 Dementia in other diseases classified elsewhere without behavioral disturbance: Secondary | ICD-10-CM | POA: Diagnosis present

## 2022-06-29 DIAGNOSIS — E785 Hyperlipidemia, unspecified: Secondary | ICD-10-CM | POA: Diagnosis present

## 2022-06-29 DIAGNOSIS — X58XXXA Exposure to other specified factors, initial encounter: Secondary | ICD-10-CM | POA: Diagnosis present

## 2022-06-29 DIAGNOSIS — E871 Hypo-osmolality and hyponatremia: Secondary | ICD-10-CM | POA: Diagnosis present

## 2022-06-29 DIAGNOSIS — Z79890 Hormone replacement therapy (postmenopausal): Secondary | ICD-10-CM | POA: Diagnosis not present

## 2022-06-29 DIAGNOSIS — E039 Hypothyroidism, unspecified: Secondary | ICD-10-CM | POA: Diagnosis present

## 2022-06-29 DIAGNOSIS — E7800 Pure hypercholesterolemia, unspecified: Secondary | ICD-10-CM | POA: Diagnosis present

## 2022-06-29 DIAGNOSIS — Z823 Family history of stroke: Secondary | ICD-10-CM | POA: Diagnosis not present

## 2022-06-29 LAB — TYPE AND SCREEN
ABO/RH(D): B POS
Antibody Screen: NEGATIVE

## 2022-06-29 LAB — BASIC METABOLIC PANEL
Anion gap: 8 (ref 5–15)
Anion gap: 8 (ref 5–15)
BUN: 29 mg/dL — ABNORMAL HIGH (ref 8–23)
BUN: 31 mg/dL — ABNORMAL HIGH (ref 8–23)
CO2: 25 mmol/L (ref 22–32)
CO2: 26 mmol/L (ref 22–32)
Calcium: 8.4 mg/dL — ABNORMAL LOW (ref 8.9–10.3)
Calcium: 8.5 mg/dL — ABNORMAL LOW (ref 8.9–10.3)
Chloride: 96 mmol/L — ABNORMAL LOW (ref 98–111)
Chloride: 95 mmol/L — ABNORMAL LOW (ref 98–111)
Creatinine, Ser: 1.33 mg/dL — ABNORMAL HIGH (ref 0.44–1.00)
Creatinine, Ser: 1.35 mg/dL — ABNORMAL HIGH (ref 0.44–1.00)
GFR, Estimated: 38 mL/min — ABNORMAL LOW (ref >60)
GFR, Estimated: 37 mL/min — ABNORMAL LOW (ref >60)
Glucose, Bld: 108 mg/dL — ABNORMAL HIGH (ref 70–99)
Glucose, Bld: 109 mg/dL — ABNORMAL HIGH (ref 70–99)
Potassium: 3.9 mmol/L (ref 3.5–5.1)
Potassium: 3.9 mmol/L (ref 3.5–5.1)
Sodium: 129 mmol/L — ABNORMAL LOW (ref 135–145)
Sodium: 129 mmol/L — ABNORMAL LOW (ref 135–145)

## 2022-06-29 LAB — BRAIN NATRIURETIC PEPTIDE: B Natriuretic Peptide: 131.4 pg/mL — ABNORMAL HIGH (ref 0.0–100.0)

## 2022-06-29 LAB — PROTIME-INR
INR: 1 (ref 0.8–1.2)
Prothrombin Time: 13.4 seconds (ref 11.4–15.2)

## 2022-06-29 LAB — LACTIC ACID, PLASMA: Lactic Acid, Venous: 1.4 mmol/L (ref 0.5–1.9)

## 2022-06-29 LAB — APTT: aPTT: 24 seconds (ref 24–36)

## 2022-06-29 MED ORDER — SENNOSIDES-DOCUSATE SODIUM 8.6-50 MG PO TABS
1.0000 | ORAL_TABLET | Freq: Every day | ORAL | Status: DC
  Administered 2022-06-29 – 2022-07-02 (×4): 1 via ORAL
  Filled 2022-06-29 (×4): qty 1

## 2022-06-29 MED ORDER — POLYETHYLENE GLYCOL 3350 17 G PO PACK
17.0000 g | PACK | Freq: Every day | ORAL | Status: DC | PRN
  Administered 2022-07-02: 17 g via ORAL
  Filled 2022-06-29: qty 1

## 2022-06-29 MED ORDER — METOPROLOL TARTRATE 25 MG PO TABS
25.0000 mg | ORAL_TABLET | Freq: Two times a day (BID) | ORAL | Status: DC
  Administered 2022-06-29 – 2022-07-02 (×7): 25 mg via ORAL
  Filled 2022-06-29 (×7): qty 1

## 2022-06-29 MED ORDER — MORPHINE SULFATE (PF) 4 MG/ML IV SOLN
4.0000 mg | Freq: Once | INTRAVENOUS | Status: AC
  Administered 2022-06-29: 4 mg via INTRAVENOUS
  Filled 2022-06-29: qty 1

## 2022-06-29 MED ORDER — VITAMIN D 25 MCG (1000 UNIT) PO TABS
1000.0000 [IU] | ORAL_TABLET | Freq: Every day | ORAL | Status: DC
  Administered 2022-06-30 – 2022-07-02 (×3): 1000 [IU] via ORAL
  Filled 2022-06-29 (×4): qty 1

## 2022-06-29 MED ORDER — GABAPENTIN 300 MG PO CAPS
600.0000 mg | ORAL_CAPSULE | Freq: Every day | ORAL | Status: DC
  Administered 2022-06-29 – 2022-07-01 (×3): 600 mg via ORAL
  Filled 2022-06-29 (×3): qty 2

## 2022-06-29 MED ORDER — ACETAMINOPHEN 325 MG PO TABS
650.0000 mg | ORAL_TABLET | Freq: Four times a day (QID) | ORAL | Status: DC | PRN
  Administered 2022-06-30: 650 mg via ORAL
  Filled 2022-06-29: qty 2

## 2022-06-29 MED ORDER — LIDOCAINE 5 % EX PTCH
1.0000 | MEDICATED_PATCH | CUTANEOUS | Status: DC
  Administered 2022-06-29 – 2022-07-01 (×3): 1 via TRANSDERMAL
  Filled 2022-06-29 (×3): qty 1

## 2022-06-29 MED ORDER — LACTATED RINGERS IV BOLUS
1000.0000 mL | Freq: Once | INTRAVENOUS | Status: AC
  Administered 2022-06-29: 1000 mL via INTRAVENOUS

## 2022-06-29 MED ORDER — HYDRALAZINE HCL 20 MG/ML IJ SOLN: 5.0000 mg | INTRAMUSCULAR | Status: DC | PRN

## 2022-06-29 MED ORDER — SODIUM CHLORIDE 1 G PO TABS
1.0000 g | ORAL_TABLET | Freq: Two times a day (BID) | ORAL | Status: DC
  Administered 2022-06-29 – 2022-06-30 (×3): 1 g via ORAL
  Filled 2022-06-29 (×3): qty 1

## 2022-06-29 MED ORDER — OXYCODONE-ACETAMINOPHEN 5-325 MG PO TABS
1.0000 | ORAL_TABLET | ORAL | Status: DC | PRN
  Administered 2022-07-01 (×2): 1 via ORAL
  Filled 2022-06-29 (×2): qty 1

## 2022-06-29 MED ORDER — DOCUSATE SODIUM 100 MG PO CAPS
100.0000 mg | ORAL_CAPSULE | Freq: Two times a day (BID) | ORAL | Status: DC | PRN
  Administered 2022-07-01: 100 mg via ORAL
  Filled 2022-06-29: qty 1

## 2022-06-29 MED ORDER — IOHEXOL 350 MG/ML SOLN
75.0000 mL | Freq: Once | INTRAVENOUS | Status: AC | PRN
  Administered 2022-06-29: 75 mL via INTRAVENOUS

## 2022-06-29 MED ORDER — AMLODIPINE BESYLATE 10 MG PO TABS
10.0000 mg | ORAL_TABLET | Freq: Every day | ORAL | Status: DC
  Administered 2022-06-30 – 2022-07-02 (×3): 10 mg via ORAL
  Filled 2022-06-29 (×3): qty 1

## 2022-06-29 MED ORDER — ONDANSETRON HCL 4 MG/2ML IJ SOLN
4.0000 mg | Freq: Three times a day (TID) | INTRAMUSCULAR | Status: DC | PRN
  Administered 2022-07-02: 4 mg via INTRAVENOUS
  Filled 2022-06-29: qty 2

## 2022-06-29 MED ORDER — MORPHINE SULFATE (PF) 2 MG/ML IV SOLN
0.5000 mg | INTRAVENOUS | Status: DC | PRN
  Administered 2022-07-01: 0.5 mg via INTRAVENOUS
  Filled 2022-06-29: qty 1

## 2022-06-29 MED ORDER — SODIUM CHLORIDE 0.9 % IV SOLN: INTRAVENOUS | Status: DC

## 2022-06-29 MED ORDER — ROPINIROLE HCL 0.25 MG PO TABS
0.2500 mg | ORAL_TABLET | Freq: Every day | ORAL | Status: DC
  Administered 2022-06-29 – 2022-07-01 (×3): 0.25 mg via ORAL
  Filled 2022-06-29 (×3): qty 1

## 2022-06-29 MED ORDER — ONDANSETRON HCL 4 MG/2ML IJ SOLN
4.0000 mg | Freq: Once | INTRAMUSCULAR | Status: AC
  Administered 2022-06-29: 4 mg via INTRAVENOUS
  Filled 2022-06-29: qty 2

## 2022-06-29 MED ORDER — LEVOTHYROXINE SODIUM 100 MCG PO TABS
100.0000 ug | ORAL_TABLET | Freq: Every day | ORAL | Status: DC
  Administered 2022-06-30 – 2022-07-02 (×3): 100 ug via ORAL
  Filled 2022-06-29 (×3): qty 1

## 2022-06-29 MED ORDER — ACETAMINOPHEN 650 MG RE SUPP: 650.0000 mg | Freq: Four times a day (QID) | RECTAL | Status: DC | PRN

## 2022-06-29 MED ORDER — PRAVASTATIN SODIUM 20 MG PO TABS
20.0000 mg | ORAL_TABLET | Freq: Every day | ORAL | Status: DC
  Administered 2022-06-29 – 2022-07-01 (×3): 20 mg via ORAL
  Filled 2022-06-29 (×3): qty 1

## 2022-06-29 MED ORDER — ALBUTEROL SULFATE (2.5 MG/3ML) 0.083% IN NEBU
3.0000 mL | INHALATION_SOLUTION | RESPIRATORY_TRACT | Status: DC | PRN
  Administered 2022-07-02: 3 mL via RESPIRATORY_TRACT
  Filled 2022-06-29: qty 3

## 2022-06-29 MED ORDER — DM-GUAIFENESIN ER 30-600 MG PO TB12: 1.0000 | ORAL_TABLET | Freq: Two times a day (BID) | ORAL | Status: DC | PRN

## 2022-06-29 MED ORDER — ADULT MULTIVITAMIN W/MINERALS CH
1.0000 | ORAL_TABLET | Freq: Every day | ORAL | Status: DC
  Administered 2022-06-30 – 2022-07-02 (×3): 1 via ORAL
  Filled 2022-06-29 (×3): qty 1

## 2022-06-29 NOTE — ED Notes (Signed)
ED Provider at bedside. 

## 2022-06-29 NOTE — H&P (Signed)
History and Physical    HENLEY REINECK WUJ:[id number] DOB: [birth year] DOA: 06/29/2022  Referring MD/NP/PA:   PCP: [facility]   Patient coming from:  The patient is coming from SNF    Chief Complaint: Abdominal pain or shortness of breath  HPI: Theresa Snyder is a 89 y.o. female with medical history significant of hiatal hernia, hypertension, hyperlipidemia, diastolic CHF, hypothyroidism, NFA-2Z, dementia, spinal stenosis, who presents with abdominal pain and shortness of breath.  Patient states that she has abdominal pain for more than 2 days, which is located in upper abdomen, constant, cramping, aching, moderate to severe, nonradiating.  Associated with nausea, no vomiting.  Patient has constipation.  She also reports dry cough and shortness of breath.  Denies chest pain, fever or chills.  No symptoms of UTI.  Patient's mental status is at baseline, oriented x 3.  Moves all extremities normally.  Data reviewed independently and ED Course: pt was found to have WBC 10.7, slightly worsening renal function, sodium 126, temperature normal, blood pressure 126/69, heart rate 86, RR 21, oxygen saturation 86% on room air, which improved to 100% on 2 L oxygen.  Chest x-ray showed large hiatal hernia and atelectasis.  CTA negative for PE, does showed left fifth rib fracture.  CT abdomen/pelvis that showed large hiatal hernia with large bowel obstruction.  Patient is admitted to telemetry bed as inpatient.  Dr. Everlene Farrier of surgery is consulted.   EKG: I have personally reviewed.  Sinus rhythm, PAC, QTc 432, nonspecific T wave change  Review of Systems:   General: no fevers, chills, no body weight gain, has poor appetite, has fatigue HEENT: no blurry vision, hearing changes or sore throat Respiratory: has dyspnea, coughing, no wheezing CV: no chest pain, no palpitations GI: has nausea,, abdominal pain, constipation no vomiting or diarrhea,  GU: no dysuria, burning on  urination, increased urinary frequency, hematuria  Ext: has trace leg edema Neuro: no unilateral weakness, numbness, or tingling, no vision change or hearing loss Skin: no rash, no skin tear. MSK: No muscle spasm, no deformity, no limitation of range of movement in spin Heme: No easy bruising.  Travel history: No recent long distant travel.   Allergy:  Allergies  Allergen Reactions   Iodinated Contrast Media Other (See Comments)    Blue, Green and Red dyes    Lorazepam     Altered mental state   Risperidone Other (See Comments)    Altered mental state    Sulfamethoxazole-Trimethoprim     Hyper    Past Medical History:  Diagnosis Date   Arthritis    Cancer (HCC)    skin   Chronic kidney disease    history of renal insufficiency   History of hiatal hernia    Hypercholesteremia    Hypertension    Hypothyroidism    Lymphedema of leg    Pneumonia    in the past   Seizures (HCC) 2014   no seizures since then   Spinal stenosis     Past Surgical History:  Procedure Laterality Date   APPENDECTOMY     CATARACT EXTRACTION W/PHACO Left 09/25/2015   Procedure: CATARACT EXTRACTION PHACO AND INTRAOCULAR LENS PLACEMENT (IOC);  Surgeon: Sallee Lange, MD;  Location: [facility] ORS;  Service: Ophthalmology;  Laterality: Left;  [place] 01:23 AP% 24.0 CDE 34.98 Fluid pack lot # [id number]   CATARACT EXTRACTION W/PHACO Right 07/01/2016   Procedure: CATARACT EXTRACTION PHACO AND INTRAOCULAR LENS PLACEMENT (IOC);  Surgeon: Sallee Lange, MD;  Location: [facility] ORS;  Service: Ophthalmology;  Laterality: Right;  [place] 1:25.9 AP% 24.8 CDE 40.61 Fluid Pack Lot # [id number]   HIP ARTHROPLASTY Left 07/10/2021   Procedure: ARTHROPLASTY BIPOLAR HIP (HEMIARTHROPLASTY);  Surgeon: Ross Marcus, MD;  Location: [facility] ORS;  Service: Orthopedics;  Laterality: Left;   TUBAL LIGATION      Social History:  reports that she has never smoked. She has never used smokeless tobacco. She reports that she does not  drink alcohol and does not use drugs.  Family History:  Family History  Problem Relation Age of Onset   Stroke Mother    Heart attack Mother    Stroke Father    Hypertension Father      Prior to Admission medications   Medication Sig Start Date End Date Taking? Authorizing Provider  albuterol (VENTOLIN HFA) 108 (90 Base) MCG/ACT inhaler Inhale 2 puffs into the lungs every 6 (six) hours as needed for wheezing or shortness of breath. 08/15/21   Pokhrel, Rebekah Chesterfield, MD  amLODipine (NORVASC) 10 MG tablet Take 10 mg by mouth daily with breakfast. for high blood pressure 01/12/15   [provider]  cholecalciferol (VITAMIN D3) 25 MCG (1000 UNIT) tablet Take 1,000 Units by mouth daily. 12/19/21   [provider]  diclofenac Sodium (VOLTAREN) 1 % GEL Apply 2 g topically 4 (four) times daily.    [provider]  docusate sodium (COLACE) 100 MG capsule Take 1 capsule (100 mg total) by mouth 2 (two) times daily as needed for mild constipation. 05/24/21   Spongberg, Susy Frizzle, MD  furosemide (LASIX) 20 MG tablet Take 1 tablet (20 mg total) by mouth daily as needed for fluid or edema. 11/01/21 11/01/22  Arnetha Courser, MD  gabapentin (NEURONTIN) 300 MG capsule Take 2 capsules (600 mg total) by mouth at bedtime. Patient taking differently: Take 600 mg by mouth at bedtime. Take 2 capsules nightly 07/14/21   Swayze, Ava, DO  HYDROcodone-acetaminophen (NORCO) 10-325 MG tablet Take 1 tablet by mouth every 6 (six) hours as needed for up to 5 days. 6-8 hours as needed.  SNF use only.  Refills per SNF MD/DO. 06/26/22 07/01/22  Dorcas Carrow, MD  levothyroxine (SYNTHROID) 100 MCG tablet Take 100 mcg by mouth daily. 12/19/21   [provider]  lidocaine (LIDODERM) 5 % Place 1 patch onto the skin daily. Remove & Discard patch within 12 hours or as directed by MD 06/27/22   Dorcas Carrow, MD  loperamide (IMODIUM) 2 MG capsule Take 2 capsules (4 mg total) by mouth as needed for diarrhea or  loose stools. 12/24/21   Tresa Moore, MD  metoprolol tartrate (LOPRESSOR) 25 MG tablet Take 1 tablet (25 mg total) by mouth 2 (two) times daily. 07/14/21   Swayze, Ava, DO  Multiple Vitamin (MULTIVITAMIN WITH MINERALS) TABS tablet Take 1 tablet by mouth daily. 07/15/21   Swayze, Ava, DO  pravastatin (PRAVACHOL) 20 MG tablet Take 20 mg by mouth at bedtime.     [provider]  rOPINIRole (REQUIP) 0.25 MG tablet Take 0.25 mg by mouth at bedtime. 12/20/21   [provider]  senna-docusate (SENOKOT-S) 8.6-50 MG tablet Take 1 tablet by mouth daily.    [provider]  TYLENOL 325 MG tablet Take 650 mg by mouth every 6 (six) hours as needed for mild pain. 08/06/21   [provider]    Physical Exam: Vitals:   06/29/22 0800 06/29/22 0900 06/29/22 1000 06/29/22 1100  BP: 126/69 120/71 139/78 128/75  Pulse: 66 72 75 84  Resp: 12 12 14 16   Temp:      TempSrc:      SpO2: 100% 100% 100% 98%  Weight:      Height:       General: Not in acute distress HEENT:       Eyes: PERRL, EOMI, no scleral icterus.       ENT: No discharge from the ears and nose, no pharynx injection, no tonsillar enlargement.        Neck: No JVD, no bruit, no mass felt. Heme: No neck lymph node enlargement. Cardiac: S1/S2, RRR, No murmurs, No gallops or rubs. Respiratory: No rales, wheezing, rhonchi or rubs. GI: Soft, nondistended, has tenderness in upper abdomen, no rebound pain, no organomegaly, BS present. GU: No hematuria Ext: has trace leg edema bilaterally. 1+DP/PT pulse bilaterally. Musculoskeletal: No joint deformities, No joint redness or warmth, no limitation of ROM in spin. Skin: No rashes.  Neuro: Alert, oriented X3, cranial nerves II-XII grossly intact, moves all extremities normally. Psych: Patient is not psychotic, no suicidal or hemocidal ideation.  Labs on Admission: I have personally reviewed following labs and imaging studies  CBC: Recent Labs  Lab 06/24/22 0619  06/24/22 2325 06/25/22 0948 06/26/22 0515 06/28/22 2122  WBC 12.9* 9.7 9.6 9.8 10.7*  NEUTROABS  --   --   --  5.6  --   HGB 11.9* 11.1* 12.7 11.8* 13.7  HCT 35.0* 32.5* 37.0 35.6* 42.2  MCV 89.5 89.3 88.9 92.0 93.0  PLT 312 301 304 312 349   Basic Metabolic Panel: Recent Labs  Lab 06/24/22 2325 06/25/22 0626 06/26/22 0515 06/28/22 2122 06/29/22 1019  NA 125* 126* 129* 126* 129*  K 3.5 3.3* 4.1 4.0 3.9  CL 91* 93* 99 92* 96*  CO2 25 24 23 23 25   GLUCOSE 131* 92 96 159* 108*  BUN 23 20 16 21  29*  CREATININE 1.02* 1.00 0.97 1.27* 1.33*  CALCIUM 8.5* 8.3* 8.5* 8.9 8.4*  MG  --   --  1.5*  --   --   PHOS  --   --  3.1  --   --    GFR: Estimated Creatinine Clearance: 23.5 mL/min (A) (by C-G formula based on SCr of 1.33 mg/dL (H)). Liver Function Tests: Recent Labs  Lab 06/28/22 2122  AST 24  ALT 15  ALKPHOS 116  BILITOT 0.7  PROT 7.0  ALBUMIN 3.8   Recent Labs  Lab 06/28/22 2122  LIPASE 74*   No results for input(s): "AMMONIA" in the last 168 hours. Coagulation Profile: Recent Labs  Lab 06/24/22 0619 06/25/22 0948  INR 1.0 0.9   Cardiac Enzymes: No results for input(s): "CKTOTAL", "CKMB", "CKMBINDEX", "TROPONINI" in the last 168 hours. BNP (last 3 results) No results for input(s): "PROBNP" in the last 8760 hours. HbA1C: No results for input(s): "HGBA1C" in the last 72 hours. CBG: Recent Labs  Lab 06/24/22 2238  GLUCAP 137*   Lipid Profile: No results for input(s): "CHOL", "HDL", "LDLCALC", "TRIG", "CHOLHDL", "LDLDIRECT" in the last 72 hours. Thyroid Function Tests: No results for input(s): "TSH", "T4TOTAL", "FREET4", "T3FREE", "THYROIDAB" in the last 72 hours. Anemia Panel: No results for input(s): "VITAMINB12", "FOLATE", "FERRITIN", "TIBC", "IRON", "RETICCTPCT" in the last 72 hours. Urine analysis:    Component Value Date/Time   COLORURINE YELLOW (A) 06/25/2022 0015   APPEARANCEUR CLEAR (A) 06/25/2022 0015   APPEARANCEUR Clear 11/21/2012  2330   LABSPEC 1.016 06/25/2022 0015   LABSPEC 1.004 11/21/2012  2330   PHURINE 5.0 06/25/2022 0015   GLUCOSEU NEGATIVE 06/25/2022 0015   GLUCOSEU Negative 11/21/2012 2330   HGBUR NEGATIVE 06/25/2022 0015   BILIRUBINUR NEGATIVE 06/25/2022 0015   BILIRUBINUR Negative 11/21/2012 2330   KETONESUR NEGATIVE 06/25/2022 0015   PROTEINUR 30 (A) 06/25/2022 0015   NITRITE NEGATIVE 06/25/2022 0015   LEUKOCYTESUR NEGATIVE 06/25/2022 0015   LEUKOCYTESUR Negative 11/21/2012 2330   Sepsis Labs: @LABRCNTIP (procalcitonin:4,lacticidven:4) )No results found for this or any previous visit (from the past 240 hour(s)).   Radiological Exams on Admission: CT Angio Chest PE W/Cm &/Or Wo Cm  Result Date: 06/29/2022 CLINICAL DATA:  Pulmonary embolism (PE) suspected, high prob; Abdominal pain, acute, nonlocalized EXAM: CT ANGIOGRAPHY CHEST CT ABDOMEN AND PELVIS WITH CONTRAST TECHNIQUE: Multidetector CT imaging of the chest was performed using the standard protocol during bolus administration of intravenous contrast. Multiplanar CT image reconstructions and MIPs were obtained to evaluate the vascular anatomy. Multidetector CT imaging of the abdomen and pelvis was performed using the standard protocol during bolus administration of intravenous contrast. RADIATION DOSE REDUCTION: This exam was performed according to the departmental dose-optimization program which includes automated exposure control, adjustment of the mA and/or kV according to patient size and/or use of iterative reconstruction technique. CONTRAST:  75mL OMNIPAQUE IOHEXOL 350 MG/ML SOLN COMPARISON:  CT chest 08/12/2021, CT abdomen pelvis 05/21/2021 FINDINGS: CTA CHEST FINDINGS Cardiovascular: There is adequate opacification of the pulmonary arterial tree. No intraluminal filling defect identified to suggest acute pulmonary embolism. The central pulmonary arteries are of normal caliber. Extensive multi-vessel coronary artery calcification. Calcification of the  mitral valve leaflets. Global cardiac size within normal limits. No pericardial effusion. Mild atherosclerotic calcification within the thoracic aorta. No aortic aneurysm. Mediastinum/Nodes: The thyroid gland is atrophic. No pathologic thoracic adenopathy. The esophagus is distally fluid-filled which may relate to esophageal dysmotility or gastroesophageal reflux. A large hiatal hernia is again identified, similar to prior examination containing the transverse colon and stomach. Lungs/Pleura: Small bilateral pleural effusions are present. This in combination with a large hiatal hernia resultant compressive atelectasis of the lower lobes bilaterally. No superimposed confluent pulmonary infiltrate. Mild right apical pleuroparenchymal scarring. No pneumothorax. No central obstructing lesion. Musculoskeletal: There is an acute fracture of the left fifth rib posteriorly. Numerous healed bilateral rib fractures are identified. Posttraumatic deformity of the right scapula noted. Interval development of a remote appearing severe T6 compression deformity with 60-70% loss of height. Accentuated thoracic kyphosis. Degenerative changes are seen throughout the thoracic spine. Review of the MIP images confirms the above findings. CT ABDOMEN and PELVIS FINDINGS Hepatobiliary: No focal liver abnormality is seen. No gallstones, gallbladder wall thickening, or biliary dilatation. Pancreas: Unremarkable Spleen: Unremarkable Adrenals/Urinary Tract: The adrenal glands are unremarkable. The kidneys are atrophic bilaterally, left greater than right. Simple cortical cysts are seen within the kidneys bilaterally for which no follow-up imaging is recommended. The kidneys are otherwise unremarkable. The bladder is unremarkable. Stomach/Bowel: There is large volume stool within the ascending and herniated transverse colon within the thorax with an abrupt caliber change as the colon exits the hernia sac best seen at axial image # 31/5 in  keeping with a partial large bowel obstruction. The distal colon is largely decompressed. Moderate sigmoid diverticulosis. The stomach, small bowel, and large bowel are otherwise unremarkable. The appendix is not clearly identified and is likely absent. No free intraperitoneal gas or fluid. Vascular/Lymphatic: Aortic atherosclerosis. No enlarged abdominal or pelvic lymph nodes. Reproductive: 2 simple cysts within the right ovary measure up to  2 cm in greatest dimension. The pelvic organs are otherwise unremarkable. Other: No abdominal wall hernia. Musculoskeletal: Left bipolar hemiarthroplasty has been performed. Osseous structures are diffusely osteopenic. Remote severe compression deformities of L3 and L4 are noted with retropulsion of the posterosuperior aspect of these vertebral bodies and resultant severe central canal stenosis at L3. While remote appearing, the L3 fracture is new since prior examination of 05/21/2021. Grade 1 anterolisthesis of L4 upon L5 in combination with degenerative changes results in severe central canal stenosis at this level. No acute bone abnormality. Review of the MIP images confirms the above findings. IMPRESSION: 1. No pulmonary embolism. 2. Extensive multi-vessel coronary artery calcification. 3. Acute fracture of the left fifth rib posteriorly. No pneumothorax. 4. Large hiatal hernia containing the transverse colon and stomach. Associated partial large bowel obstruction with large volume stool within the ascending and herniated transverse colon within the thorax with transition point as the transverse colon exits the hernia sac within the left upper quadrant. 5. Moderate sigmoid diverticulosis without superimposed acute inflammatory change. 6. Remote appearing severe T6, L3, and L4 compression fractures with resultant severe central canal stenosis at L3 and L4. The T6 and L3 fracture are new since prior examination of 08/12/2021 and 05/21/2021, respectively. Aortic  Atherosclerosis (ICD10-I70.0). Electronically Signed   By: Helyn Numbers M.D.   On: 06/29/2022 03:24   CT ABDOMEN PELVIS W CONTRAST  Result Date: 06/29/2022 CLINICAL DATA:  Pulmonary embolism (PE) suspected, high prob; Abdominal pain, acute, nonlocalized EXAM: CT ANGIOGRAPHY CHEST CT ABDOMEN AND PELVIS WITH CONTRAST TECHNIQUE: Multidetector CT imaging of the chest was performed using the standard protocol during bolus administration of intravenous contrast. Multiplanar CT image reconstructions and MIPs were obtained to evaluate the vascular anatomy. Multidetector CT imaging of the abdomen and pelvis was performed using the standard protocol during bolus administration of intravenous contrast. RADIATION DOSE REDUCTION: This exam was performed according to the departmental dose-optimization program which includes automated exposure control, adjustment of the mA and/or kV according to patient size and/or use of iterative reconstruction technique. CONTRAST:  75mL OMNIPAQUE IOHEXOL 350 MG/ML SOLN COMPARISON:  CT chest 08/12/2021, CT abdomen pelvis 05/21/2021 FINDINGS: CTA CHEST FINDINGS Cardiovascular: There is adequate opacification of the pulmonary arterial tree. No intraluminal filling defect identified to suggest acute pulmonary embolism. The central pulmonary arteries are of normal caliber. Extensive multi-vessel coronary artery calcification. Calcification of the mitral valve leaflets. Global cardiac size within normal limits. No pericardial effusion. Mild atherosclerotic calcification within the thoracic aorta. No aortic aneurysm. Mediastinum/Nodes: The thyroid gland is atrophic. No pathologic thoracic adenopathy. The esophagus is distally fluid-filled which may relate to esophageal dysmotility or gastroesophageal reflux. A large hiatal hernia is again identified, similar to prior examination containing the transverse colon and stomach. Lungs/Pleura: Small bilateral pleural effusions are present. This in  combination with a large hiatal hernia resultant compressive atelectasis of the lower lobes bilaterally. No superimposed confluent pulmonary infiltrate. Mild right apical pleuroparenchymal scarring. No pneumothorax. No central obstructing lesion. Musculoskeletal: There is an acute fracture of the left fifth rib posteriorly. Numerous healed bilateral rib fractures are identified. Posttraumatic deformity of the right scapula noted. Interval development of a remote appearing severe T6 compression deformity with 60-70% loss of height. Accentuated thoracic kyphosis. Degenerative changes are seen throughout the thoracic spine. Review of the MIP images confirms the above findings. CT ABDOMEN and PELVIS FINDINGS Hepatobiliary: No focal liver abnormality is seen. No gallstones, gallbladder wall thickening, or biliary dilatation. Pancreas: Unremarkable Spleen: Unremarkable Adrenals/Urinary Tract: The  adrenal glands are unremarkable. The kidneys are atrophic bilaterally, left greater than right. Simple cortical cysts are seen within the kidneys bilaterally for which no follow-up imaging is recommended. The kidneys are otherwise unremarkable. The bladder is unremarkable. Stomach/Bowel: There is large volume stool within the ascending and herniated transverse colon within the thorax with an abrupt caliber change as the colon exits the hernia sac best seen at axial image # 31/5 in keeping with a partial large bowel obstruction. The distal colon is largely decompressed. Moderate sigmoid diverticulosis. The stomach, small bowel, and large bowel are otherwise unremarkable. The appendix is not clearly identified and is likely absent. No free intraperitoneal gas or fluid. Vascular/Lymphatic: Aortic atherosclerosis. No enlarged abdominal or pelvic lymph nodes. Reproductive: 2 simple cysts within the right ovary measure up to 2 cm in greatest dimension. The pelvic organs are otherwise unremarkable. Other: No abdominal wall hernia.  Musculoskeletal: Left bipolar hemiarthroplasty has been performed. Osseous structures are diffusely osteopenic. Remote severe compression deformities of L3 and L4 are noted with retropulsion of the posterosuperior aspect of these vertebral bodies and resultant severe central canal stenosis at L3. While remote appearing, the L3 fracture is new since prior examination of 05/21/2021. Grade 1 anterolisthesis of L4 upon L5 in combination with degenerative changes results in severe central canal stenosis at this level. No acute bone abnormality. Review of the MIP images confirms the above findings. IMPRESSION: 1. No pulmonary embolism. 2. Extensive multi-vessel coronary artery calcification. 3. Acute fracture of the left fifth rib posteriorly. No pneumothorax. 4. Large hiatal hernia containing the transverse colon and stomach. Associated partial large bowel obstruction with large volume stool within the ascending and herniated transverse colon within the thorax with transition point as the transverse colon exits the hernia sac within the left upper quadrant. 5. Moderate sigmoid diverticulosis without superimposed acute inflammatory change. 6. Remote appearing severe T6, L3, and L4 compression fractures with resultant severe central canal stenosis at L3 and L4. The T6 and L3 fracture are new since prior examination of 08/12/2021 and 05/21/2021, respectively. Aortic Atherosclerosis (ICD10-I70.0). Electronically Signed   By: Helyn Numbers M.D.   On: 06/29/2022 03:24   DG Chest 2 View  Result Date: 06/28/2022 CLINICAL DATA:  Shortness of breath. EXAM: CHEST - 2 VIEW COMPARISON:  Chest radiograph dated 06/21/2022. FINDINGS: Large hiatal hernia with associated compressive atelectasis of the lung bases. Pneumonia is not excluded. No pleural effusion or pneumothorax. The cardiac borders are silhouetted. Osteopenia with degenerative changes of the spine. No acute osseous pathology. IMPRESSION: Large hiatal hernia with  associated compressive atelectasis of the lung bases. Electronically Signed   By: Elgie Collard M.D.   On: 06/28/2022 23:54      Assessment/Plan Principal Problem:   Large bowel obstruction (HCC) Active Problems:   Hiatal hernia   Chronic diastolic CHF (congestive heart failure) (HCC)   SOB (shortness of breath)   Hyperlipidemia   Hyponatremia   HTN (hypertension)   Hypothyroidism   Chronic kidney disease, stage 3a (HCC)   Left rib fracture   Dementia without behavioral disturbance (HCC)   Assessment and Plan:  Large bowel obstruction due to large hiatal hernia: consulted general surgeon, Dr. Everlene Farrier who does not think patient is a good candidate for surgery.  Dr. Everlene Farrier spoke with patient's daughter, who is interested in palliative care.   -will admit to tele bed as inpt -Pain control: As needed morphine, Percocet, Tylenol -NPO now -IV fluid: 1 L LR, then 75 cc/h -Palliative consult  Chronic diastolic CHF (congestive heart failure) (HCC): 2D echo on 08/13/2021 showed EF of 60 to 65% with grade 2 diastolic dysfunction.  Patient has trace leg edema, no JVD, BNP 131, does not seem to have CHF exacerbation. -Hold Lasix due to hyponatremia and worsening renal function  SOB (shortness of breath): Possibly due to large hiatal hernia causing atelectasis.  Patient is on 2 L oxygen, breathing comfortably. -As needed albuterol and Mucinex -Nasal cannula oxygen to maintain oxygen saturation above 93%  Hyperlipidemia -Pravastatin  Hyponatremia: Sodium 126, likely due to decreased oral intake and dehydration -IV fluid as above -Sodium chloride tablet 1 g twice daily if patient can take it  HTN (hypertension) -Hold Lasix -Amlodipine, metoprolol -IV hydralazine as needed  Hypothyroidism -Synthroid  Chronic kidney disease, stage 3a (HCC): Slightly worsened than baseline.  Recent baseline creatinine 0.97 on 06/26/2022.  Her creatinine is 7.27, BUN 21, GFR 40. -Follow-up with  BMP  Left rib fracture: Patient has left fifth rib fracture -Pain control -Incentive spirometry  Dementia without behavioral disturbance (HCC) -Fall precaution -Requip at night         DVT ppx: SCD  Code Status: Full code  Family Communication:  Yes, patient's son by phone  Disposition Plan:  Anticipate discharge back to previous environment  Consults called:  Dr. Everlene Farrier of surgery  Admission status and Level of care: Telemetry Medical:  as inpt     Dispo: The patient is from: SNF              Anticipated d/c is to: SNF              Anticipated d/c date is: 2 days              Patient currently is not medically stable to d/c.    Severity of Illness:  The appropriate patient status for this patient is INPATIENT. Inpatient status is judged to be reasonable and necessary in order to provide the required intensity of service to ensure the patient's safety. The patient's presenting symptoms, physical exam findings, and initial radiographic and laboratory data in the context of their chronic comorbidities is felt to place them at high risk for further clinical deterioration. Furthermore, it is not anticipated that the patient will be medically stable for discharge from the hospital within 2 midnights of admission.   * I certify that at the point of admission it is my clinical judgment that the patient will require inpatient hospital care spanning beyond 2 midnights from the point of admission due to high intensity of service, high risk for further deterioration and high frequency of surveillance required.*       Date of Service 06/29/2022    Lorretta Harp [facility]   If 7PM-7AM, please contact night-coverage [email or web address] 06/29/2022, 12:16 PM

## 2022-06-29 NOTE — ED Notes (Signed)
PT brought to ed rm 16 at this time, this RN now assuming care.  

## 2022-06-29 NOTE — ED Notes (Signed)
Pt resting comfortably with eyes closed at this time. Respirations even and unlabored.

## 2022-06-29 NOTE — ED Notes (Signed)
Pt was repositioned and depends changed.

## 2022-06-29 NOTE — ED Notes (Signed)
Patient transported back from CT 

## 2022-06-29 NOTE — ED Notes (Signed)
Patient transported to CT 

## 2022-06-29 NOTE — ED Provider Notes (Signed)
[facility] Provider Note    Event Date/Time   First MD Initiated Contact with Patient 06/29/22 [phone]     (approximate)   History   Chief Complaint Abdominal Pain   HPI  Theresa Snyder is a 89 y.o. female with past medical history of hypertension, hyperlipidemia, CKD, hypothyroidism, and dementia who presents to the ED complaining of abdominal pain.  Patient reports that she has been dealing with increasing pain across her abdomen diffusely over the past 2 days.  She describes it as a constant crampy pain that has been associated with constipation and nausea, but she has not vomited.  She also reports a cough with some difficulty breathing, denies any pain in her chest or fever.  She has not noticed any pain or swelling in her legs.  She does state that when she has been short of breath in the past, she has been told this is due to a hiatal hernia.     Physical Exam   Triage Vital Signs: ED Triage Vitals  Enc Vitals Group     BP 06/28/22 2118 135/75     Pulse Rate 06/28/22 2118 83     Resp 06/28/22 2118 16     Temp 06/28/22 2118 98 F (36.7 C)     Temp Source 06/28/22 2118 Oral     SpO2 06/28/22 2118 (!) 86 %     Weight 06/28/22 2119 140 lb 14 oz (63.9 kg)     Height 06/28/22 2119 5' (1.524 m)     Head Circumference --      Peak Flow --      Pain Score 06/28/22 2118 5     Pain Loc --      Pain Edu? --      Excl. in GC? --     Most recent vital signs: Vitals:   06/29/22 0200 06/29/22 0317  BP: 130/68 136/77  Pulse: 78 78  Resp: 19 (!) 21  Temp:    SpO2: 99% 100%    Constitutional: Alert and oriented. Eyes: Conjunctivae are normal. Head: Atraumatic. Nose: No congestion/rhinnorhea. Mouth/Throat: Mucous membranes are moist.  Cardiovascular: Normal rate, regular rhythm. Grossly normal heart sounds.  2+ radial pulses bilaterally. Respiratory: Tachypneic with mildly increased respiratory effort.  No retractions. Lungs  CTAB. Gastrointestinal: Soft and diffusely tender to palpation with no rebound or guarding. No distention. Musculoskeletal: No lower extremity tenderness nor edema.  Neurologic:  Normal speech and language. No gross focal neurologic deficits are appreciated.    ED Results / Procedures / Treatments   Labs (all labs ordered are listed, but only abnormal results are displayed) Labs Reviewed  LIPASE, BLOOD - Abnormal; Notable for the following components:      Result Value   Lipase 74 (*)    All other components within normal limits  COMPREHENSIVE METABOLIC PANEL - Abnormal; Notable for the following components:   Sodium 126 (*)    Chloride 92 (*)    Glucose, Bld 159 (*)    Creatinine, Ser 1.27 (*)    GFR, Estimated 40 (*)    All other components within normal limits  CBC - Abnormal; Notable for the following components:   WBC 10.7 (*)    All other components within normal limits  URINALYSIS, ROUTINE W REFLEX MICROSCOPIC  TROPONIN I (HIGH SENSITIVITY)     EKG  ED ECG REPORT I, Chesley Noon, the attending physician, personally viewed and interpreted this ECG.   Date: 06/29/2022  EKG  Time: 21:30  Rate: 96  Rhythm: normal sinus rhythm  Axis: Normal  Intervals:none  ST&T Change: None  RADIOLOGY Chest x-ray reviewed and interpreted by me with large hiatal hernia, no focal infiltrate or effusion noted.  PROCEDURES:  Critical Care performed: Yes, see critical care procedure note(s)  .Critical Care  Performed by: Chesley Noon, MD Authorized by: Chesley Noon, MD   Critical care provider statement:    Critical care time (minutes):  30   Critical care time was exclusive of:  Separately billable procedures and treating other patients and teaching time   Critical care was necessary to treat or prevent imminent or life-threatening deterioration of the following conditions:  Respiratory failure   Critical care was time spent personally by me on the following  activities:  Development of treatment plan with patient or surrogate, discussions with consultants, evaluation of patient's response to treatment, examination of patient, ordering and review of laboratory studies, ordering and review of radiographic studies, ordering and performing treatments and interventions, pulse oximetry, re-evaluation of patient's condition and review of old charts   I assumed direction of critical care for this patient from another provider in my specialty: no     Care discussed with: admitting provider      MEDICATIONS ORDERED IN ED: Medications  morphine (PF) 4 MG/ML injection 4 mg (4 mg Intravenous Given 06/29/22 0222)  ondansetron (ZOFRAN) injection 4 mg (4 mg Intravenous Given 06/29/22 0221)  lactated ringers bolus 1,000 mL (0 mLs Intravenous Stopped 06/29/22 0330)  iohexol (OMNIPAQUE) 350 MG/ML injection 75 mL (75 mLs Intravenous Contrast Given 06/29/22 0244)     IMPRESSION / MDM / ASSESSMENT AND PLAN / ED COURSE  I reviewed the triage vital signs and the nursing notes.                              89 y.o. female with past medical history of hypertension, hyperlipidemia, hypothyroidism, CKD, and dementia who presents to the ED for 2 days of increasing abdominal pain, nausea, and constipation, also reports some cough and difficulty breathing.  Patient's presentation is most consistent with acute presentation with potential threat to life or bodily function.  Differential diagnosis includes, but is not limited to, bowel obstruction, constipation, UTI, kidney stone, diverticulitis, colitis, ACS, PE, pneumonia, COPD, CHF.  Patient nontoxic-appearing and in no acute distress, does have mildly increased work of breathing and noted to be hypoxic to 86% on room air in triage.  She was placed on 2 L nasal cannula with improvement, chest x-ray shows large hiatal hernia with compressive atelectasis which could be the cause of her difficulty breathing.  We will further assess  with CTA of her chest, also check CT of her abdomen/pelvis given diffuse abdominal tenderness and pain.  Chart notes IV contrast allergy, but this appears to be related to food dyes rather than IV contrast as patient has previously tolerated CT imaging with IV contrast without difficulty.  Labs with worsening hyponatremia compared to previous, patient also with mild AKI.  We will hydrate with IV fluids, troponin within normal limits and no significant anemia or leukocytosis noted.  We will treat symptomatically with IV morphine and Zofran, reassess following imaging.  CTA is negative for PE or other pulmonary process, CT of abdomen/pelvis does show large hiatal hernia involving the transverse large bowel with significant stool retention and partial obstruction.  Finding was discussed with Dr. Aleen Campi of general surgery, patient to be  seen by surgery in consultation and he recommends admission to the hospitalist service.  Patient's pain improved on reassessment, suspect hypoxic respiratory failure due to atelectasis from large hiatal hernia.  Case discussed with hospitalist for admission.      FINAL CLINICAL IMPRESSION(S) / ED DIAGNOSES   Final diagnoses:  Large bowel obstruction (HCC)  Acute respiratory failure with hypoxia (HCC)  Hiatal hernia     Rx / DC Orders   ED Discharge Orders     None        Note:  This document was prepared using Dragon voice recognition software and may include unintentional dictation errors.   Chesley Noon, MD 06/29/22 [phone]

## 2022-06-29 NOTE — ED Notes (Signed)
Pt gave permission to talk to daughter laura.

## 2022-06-29 NOTE — Consult Note (Addendum)
Surgical Consultation  06/29/2022  Theresa Snyder is an 89 y.o. female.   Chief Complaint  Patient presents with   Abdominal Pain     HPI: This is a 89 year old female requested to be seen by Dr. Aleen Campi in consultation for giant paraesophageal hernia with evidence of some partial large bowel obstruction. Does have history of dementia and is 89 years old.  Most of the history was taken from the daughter [name] who makes decisions for her.  She is in the home and that has required assistance for bathing's for some activities of daily living.  Apparently she does have where dependence with some incontinence.  She does have Alzheimer's and she has good days and bad days.  On good days she is able to be seen by out terminals but on bad days she lays in bed most of the time moans and groans due to pain.  She does have significant medical history to include chronic back pain, chronic kidney disease, chronic CHF The patient tells me that for the past few days she has intermittent abdominal pain that is moderate.  Seems to be sharp.  Decreased appetite.  She also feels significant shortness of breath. The scan personally reviewed showing evidence of a giant paraesophageal hernia containing a portion of the transverse colon and there is a transition area within the transverse colon suggestive of colon obstruction.   Past Medical History:  Diagnosis Date   Arthritis    Cancer (HCC)    skin   Chronic kidney disease    history of renal insufficiency   History of hiatal hernia    Hypercholesteremia    Hypertension    Hypothyroidism    Lymphedema of leg    Pneumonia    in the past   Seizures (HCC) 2014   no seizures since then   Spinal stenosis     Past Surgical History:  Procedure Laterality Date   APPENDECTOMY     CATARACT EXTRACTION W/PHACO Left 09/25/2015   Procedure: CATARACT EXTRACTION PHACO AND INTRAOCULAR LENS PLACEMENT (IOC);  Surgeon: Sallee Lange, MD;  Location: [facility] ORS;   Service: Ophthalmology;  Laterality: Left;  [place] 01:23 AP% 24.0 CDE 34.98 Fluid pack lot # [id number]   CATARACT EXTRACTION W/PHACO Right 07/01/2016   Procedure: CATARACT EXTRACTION PHACO AND INTRAOCULAR LENS PLACEMENT (IOC);  Surgeon: Sallee Lange, MD;  Location: [facility] ORS;  Service: Ophthalmology;  Laterality: Right;  [place] 1:25.9 AP% 24.8 CDE 40.61 Fluid Pack Lot # [id number]   HIP ARTHROPLASTY Left 07/10/2021   Procedure: ARTHROPLASTY BIPOLAR HIP (HEMIARTHROPLASTY);  Surgeon: Ross Marcus, MD;  Location: [facility] ORS;  Service: Orthopedics;  Laterality: Left;   TUBAL LIGATION      Family History  Problem Relation Age of Onset   Stroke Mother    Heart attack Mother    Stroke Father    Hypertension Father     Social History:  reports that she has never smoked. She has never used smokeless tobacco. She reports that she does not drink alcohol and does not use drugs.  Allergies:  Allergies  Allergen Reactions   Iodinated Contrast Media Other (See Comments)    Blue, Green and Red dyes    Lorazepam     Altered mental state   Risperidone Other (See Comments)    Altered mental state    Sulfamethoxazole-Trimethoprim     Hyper    Medications reviewed.     ROS Able to reliably obtain a review of systems given patient memory issues  and Alzheimer's   BP 128/75   Pulse 84   Temp 98 F (36.7 C) (Oral)   Resp 16   Ht 5' (1.524 m)   Wt 63.9 kg   SpO2 98%   BMI 27.51 kg/m   Physical Exam Vitals and nursing note reviewed. Exam conducted with a chaperone present.  Constitutional:      General: She is not in acute distress.    Comments: Chronically ill and malnourished  Eyes:     General: No scleral icterus. Cardiovascular:     Rate and Rhythm: Normal rate.     Heart sounds: No murmur heard. Pulmonary:     Effort: Pulmonary effort is normal.     Breath sounds: Rhonchi present.  Abdominal:     General: Abdomen is scaphoid. Bowel sounds are decreased. There is distension.  There is no abdominal bruit.     Palpations: Abdomen is soft.     Tenderness: There is generalized abdominal tenderness. There is no guarding or rebound. Negative signs include Murphy's sign.     Hernia: No hernia is present.  Skin:    General: Skin is warm and dry.     Capillary Refill: Capillary refill takes less than 2 seconds.  Neurological:     General: No focal deficit present.     Mental Status: She is alert and oriented to person, place, and time.  Psychiatric:        Mood and Affect: Mood normal.        Behavior: Behavior normal.       Results for orders placed or performed during the hospital encounter of 06/29/22 (from the past 48 hour(s))  Lipase, blood     Status: Abnormal   Collection Time: 06/28/22  9:22 PM  Result Value Ref Range   Lipase 74 (H) 11 - 51 U/L    Comment: Performed at [facility], [address] [place], [place] [address]  Comprehensive metabolic panel     Status: Abnormal   Collection Time: 06/28/22  9:22 PM  Result Value Ref Range   Sodium 126 (L) 135 - 145 mmol/L   Potassium 4.0 3.5 - 5.1 mmol/L   Chloride 92 (L) 98 - 111 mmol/L   CO2 23 22 - 32 mmol/L   Glucose, Bld 159 (H) 70 - 99 mg/dL    Comment: Glucose reference range applies only to samples taken after fasting for at least 8 hours.   BUN 21 8 - 23 mg/dL   Creatinine, Ser 6.04 (H) 0.44 - 1.00 mg/dL   Calcium 8.9 8.9 - 54.0 mg/dL   Total Protein 7.0 6.5 - 8.1 g/dL   Albumin 3.8 3.5 - 5.0 g/dL   AST 24 15 - 41 U/L   ALT 15 0 - 44 U/L   Alkaline Phosphatase 116 38 - 126 U/L   Total Bilirubin 0.7 0.3 - 1.2 mg/dL   GFR, Estimated 40 (L) >60 mL/min    Comment: (NOTE) Calculated using the CKD-EPI Creatinine Equation (2021)    Anion gap 11 5 - 15    Comment: Performed at [facility], [address] [place], [place] [address]  CBC     Status: Abnormal   Collection Time: 06/28/22  9:22 PM  Result Value Ref Range   WBC 10.7 (H) 4.0 - 10.5 K/uL   RBC 4.54 3.87 -  5.11 MIL/uL   Hemoglobin 13.7 12.0 - 15.0 g/dL   HCT 14.7 82.9 - 56.2 %   MCV 93.0 80.0 -  100.0 fL   MCH 30.2 26.0 - 34.0 pg   MCHC 32.5 30.0 - 36.0 g/dL   RDW 60.4 54.0 - 98.1 %   Platelets 349 150 - 400 K/uL   nRBC 0.0 0.0 - 0.2 %    Comment: Performed at [facility], [address] [place], [place] [address]  Troponin I (High Sensitivity)     Status: None   Collection Time: 06/28/22  9:22 PM  Result Value Ref Range   Troponin I (High Sensitivity) 5 <18 ng/L    Comment: (NOTE) Elevated high sensitivity troponin I (hsTnI) values and significant  changes across serial measurements may suggest ACS but many other  chronic and acute conditions are known to elevate hsTnI results.  Refer to the "Links" section for chest pain algorithms and additional  guidance. Performed at [facility], [address] [place], [place] [address]   Lactic acid, plasma     Status: None   Collection Time: 06/29/22  5:50 AM  Result Value Ref Range   Lactic Acid, Venous 1.4 0.5 - 1.9 mmol/L    Comment: Performed at [facility], [address] [place], [place] [address]  Basic metabolic panel     Status: Abnormal   Collection Time: 06/29/22 10:19 AM  Result Value Ref Range   Sodium 129 (L) 135 - 145 mmol/L   Potassium 3.9 3.5 - 5.1 mmol/L   Chloride 96 (L) 98 - 111 mmol/L   CO2 25 22 - 32 mmol/L   Glucose, Bld 108 (H) 70 - 99 mg/dL    Comment: Glucose reference range applies only to samples taken after fasting for at least 8 hours.   BUN 29 (H) 8 - 23 mg/dL   Creatinine, Ser 6.57 (H) 0.44 - 1.00 mg/dL   Calcium 8.4 (L) 8.9 - 10.3 mg/dL   GFR, Estimated 38 (L) >60 mL/min    Comment: (NOTE) Calculated using the CKD-EPI Creatinine Equation (2021)    Anion gap 8 5 - 15    Comment: Performed at [facility], [address] [place], [place] [address]  Brain natriuretic peptide     Status: Abnormal   Collection Time: 06/29/22 12:50 PM  Result Value Ref Range    B Natriuretic Peptide 131.4 (H) 0.0 - 100.0 pg/mL    Comment: Performed at [facility], [address] [place], [place] [address]  Protime-INR     Status: None   Collection Time: 06/29/22 12:50 PM  Result Value Ref Range   Prothrombin Time 13.4 11.4 - 15.2 seconds   INR 1.0 0.8 - 1.2    Comment: (NOTE) INR goal varies based on device and disease states. Performed at [facility], [address] [place], [place] [address]   APTT     Status: None   Collection Time: 06/29/22 12:50 PM  Result Value Ref Range   aPTT 24 24 - 36 seconds    Comment: Performed at [facility], [address] [place], [place] [address]  Type and screen     Status: None (Preliminary result)   Collection Time: 06/29/22 12:50 PM  Result Value Ref Range   ABO/RH(D) PENDING    Antibody Screen PENDING    Sample Expiration      07/02/2022,2359 Performed at [facility], [address] [place], [place] [address]    CT Angio Chest PE W/Cm &/Or Wo Cm  Result Date: 06/29/2022 CLINICAL DATA:  Pulmonary embolism (PE) suspected, high prob; Abdominal  pain, acute, nonlocalized EXAM: CT ANGIOGRAPHY CHEST CT ABDOMEN AND PELVIS WITH CONTRAST TECHNIQUE: Multidetector CT imaging of the chest was performed using the standard protocol during bolus administration of intravenous contrast. Multiplanar CT image reconstructions and MIPs were obtained to evaluate the vascular anatomy. Multidetector CT imaging of the abdomen and pelvis was performed using the standard protocol during bolus administration of intravenous contrast. RADIATION DOSE REDUCTION: This exam was performed according to the departmental dose-optimization program which includes automated exposure control, adjustment of the mA and/or kV according to patient size and/or use of iterative reconstruction technique. CONTRAST:  75mL OMNIPAQUE IOHEXOL 350 MG/ML SOLN COMPARISON:  CT chest 08/12/2021, CT abdomen pelvis 05/21/2021  FINDINGS: CTA CHEST FINDINGS Cardiovascular: There is adequate opacification of the pulmonary arterial tree. No intraluminal filling defect identified to suggest acute pulmonary embolism. The central pulmonary arteries are of normal caliber. Extensive multi-vessel coronary artery calcification. Calcification of the mitral valve leaflets. Global cardiac size within normal limits. No pericardial effusion. Mild atherosclerotic calcification within the thoracic aorta. No aortic aneurysm. Mediastinum/Nodes: The thyroid gland is atrophic. No pathologic thoracic adenopathy. The esophagus is distally fluid-filled which may relate to esophageal dysmotility or gastroesophageal reflux. A large hiatal hernia is again identified, similar to prior examination containing the transverse colon and stomach. Lungs/Pleura: Small bilateral pleural effusions are present. This in combination with a large hiatal hernia resultant compressive atelectasis of the lower lobes bilaterally. No superimposed confluent pulmonary infiltrate. Mild right apical pleuroparenchymal scarring. No pneumothorax. No central obstructing lesion. Musculoskeletal: There is an acute fracture of the left fifth rib posteriorly. Numerous healed bilateral rib fractures are identified. Posttraumatic deformity of the right scapula noted. Interval development of a remote appearing severe T6 compression deformity with 60-70% loss of height. Accentuated thoracic kyphosis. Degenerative changes are seen throughout the thoracic spine. Review of the MIP images confirms the above findings. CT ABDOMEN and PELVIS FINDINGS Hepatobiliary: No focal liver abnormality is seen. No gallstones, gallbladder wall thickening, or biliary dilatation. Pancreas: Unremarkable Spleen: Unremarkable Adrenals/Urinary Tract: The adrenal glands are unremarkable. The kidneys are atrophic bilaterally, left greater than right. Simple cortical cysts are seen within the kidneys bilaterally for which no  follow-up imaging is recommended. The kidneys are otherwise unremarkable. The bladder is unremarkable. Stomach/Bowel: There is large volume stool within the ascending and herniated transverse colon within the thorax with an abrupt caliber change as the colon exits the hernia sac best seen at axial image # 31/5 in keeping with a partial large bowel obstruction. The distal colon is largely decompressed. Moderate sigmoid diverticulosis. The stomach, small bowel, and large bowel are otherwise unremarkable. The appendix is not clearly identified and is likely absent. No free intraperitoneal gas or fluid. Vascular/Lymphatic: Aortic atherosclerosis. No enlarged abdominal or pelvic lymph nodes. Reproductive: 2 simple cysts within the right ovary measure up to 2 cm in greatest dimension. The pelvic organs are otherwise unremarkable. Other: No abdominal wall hernia. Musculoskeletal: Left bipolar hemiarthroplasty has been performed. Osseous structures are diffusely osteopenic. Remote severe compression deformities of L3 and L4 are noted with retropulsion of the posterosuperior aspect of these vertebral bodies and resultant severe central canal stenosis at L3. While remote appearing, the L3 fracture is new since prior examination of 05/21/2021. Grade 1 anterolisthesis of L4 upon L5 in combination with degenerative changes results in severe central canal stenosis at this level. No acute bone abnormality. Review of the MIP images confirms the above findings. IMPRESSION: 1. No pulmonary embolism. 2. Extensive multi-vessel coronary artery calcification. 3.  Acute fracture of the left fifth rib posteriorly. No pneumothorax. 4. Large hiatal hernia containing the transverse colon and stomach. Associated partial large bowel obstruction with large volume stool within the ascending and herniated transverse colon within the thorax with transition point as the transverse colon exits the hernia sac within the left upper quadrant. 5. Moderate  sigmoid diverticulosis without superimposed acute inflammatory change. 6. Remote appearing severe T6, L3, and L4 compression fractures with resultant severe central canal stenosis at L3 and L4. The T6 and L3 fracture are new since prior examination of 08/12/2021 and 05/21/2021, respectively. Aortic Atherosclerosis (ICD10-I70.0). Electronically Signed   By: Helyn Numbers M.D.   On: 06/29/2022 03:24   CT ABDOMEN PELVIS W CONTRAST  Result Date: 06/29/2022 CLINICAL DATA:  Pulmonary embolism (PE) suspected, high prob; Abdominal pain, acute, nonlocalized EXAM: CT ANGIOGRAPHY CHEST CT ABDOMEN AND PELVIS WITH CONTRAST TECHNIQUE: Multidetector CT imaging of the chest was performed using the standard protocol during bolus administration of intravenous contrast. Multiplanar CT image reconstructions and MIPs were obtained to evaluate the vascular anatomy. Multidetector CT imaging of the abdomen and pelvis was performed using the standard protocol during bolus administration of intravenous contrast. RADIATION DOSE REDUCTION: This exam was performed according to the departmental dose-optimization program which includes automated exposure control, adjustment of the mA and/or kV according to patient size and/or use of iterative reconstruction technique. CONTRAST:  75mL OMNIPAQUE IOHEXOL 350 MG/ML SOLN COMPARISON:  CT chest 08/12/2021, CT abdomen pelvis 05/21/2021 FINDINGS: CTA CHEST FINDINGS Cardiovascular: There is adequate opacification of the pulmonary arterial tree. No intraluminal filling defect identified to suggest acute pulmonary embolism. The central pulmonary arteries are of normal caliber. Extensive multi-vessel coronary artery calcification. Calcification of the mitral valve leaflets. Global cardiac size within normal limits. No pericardial effusion. Mild atherosclerotic calcification within the thoracic aorta. No aortic aneurysm. Mediastinum/Nodes: The thyroid gland is atrophic. No pathologic thoracic adenopathy.  The esophagus is distally fluid-filled which may relate to esophageal dysmotility or gastroesophageal reflux. A large hiatal hernia is again identified, similar to prior examination containing the transverse colon and stomach. Lungs/Pleura: Small bilateral pleural effusions are present. This in combination with a large hiatal hernia resultant compressive atelectasis of the lower lobes bilaterally. No superimposed confluent pulmonary infiltrate. Mild right apical pleuroparenchymal scarring. No pneumothorax. No central obstructing lesion. Musculoskeletal: There is an acute fracture of the left fifth rib posteriorly. Numerous healed bilateral rib fractures are identified. Posttraumatic deformity of the right scapula noted. Interval development of a remote appearing severe T6 compression deformity with 60-70% loss of height. Accentuated thoracic kyphosis. Degenerative changes are seen throughout the thoracic spine. Review of the MIP images confirms the above findings. CT ABDOMEN and PELVIS FINDINGS Hepatobiliary: No focal liver abnormality is seen. No gallstones, gallbladder wall thickening, or biliary dilatation. Pancreas: Unremarkable Spleen: Unremarkable Adrenals/Urinary Tract: The adrenal glands are unremarkable. The kidneys are atrophic bilaterally, left greater than right. Simple cortical cysts are seen within the kidneys bilaterally for which no follow-up imaging is recommended. The kidneys are otherwise unremarkable. The bladder is unremarkable. Stomach/Bowel: There is large volume stool within the ascending and herniated transverse colon within the thorax with an abrupt caliber change as the colon exits the hernia sac best seen at axial image # 31/5 in keeping with a partial large bowel obstruction. The distal colon is largely decompressed. Moderate sigmoid diverticulosis. The stomach, small bowel, and large bowel are otherwise unremarkable. The appendix is not clearly identified and is likely absent. No free  intraperitoneal gas  or fluid. Vascular/Lymphatic: Aortic atherosclerosis. No enlarged abdominal or pelvic lymph nodes. Reproductive: 2 simple cysts within the right ovary measure up to 2 cm in greatest dimension. The pelvic organs are otherwise unremarkable. Other: No abdominal wall hernia. Musculoskeletal: Left bipolar hemiarthroplasty has been performed. Osseous structures are diffusely osteopenic. Remote severe compression deformities of L3 and L4 are noted with retropulsion of the posterosuperior aspect of these vertebral bodies and resultant severe central canal stenosis at L3. While remote appearing, the L3 fracture is new since prior examination of 05/21/2021. Grade 1 anterolisthesis of L4 upon L5 in combination with degenerative changes results in severe central canal stenosis at this level. No acute bone abnormality. Review of the MIP images confirms the above findings. IMPRESSION: 1. No pulmonary embolism. 2. Extensive multi-vessel coronary artery calcification. 3. Acute fracture of the left fifth rib posteriorly. No pneumothorax. 4. Large hiatal hernia containing the transverse colon and stomach. Associated partial large bowel obstruction with large volume stool within the ascending and herniated transverse colon within the thorax with transition point as the transverse colon exits the hernia sac within the left upper quadrant. 5. Moderate sigmoid diverticulosis without superimposed acute inflammatory change. 6. Remote appearing severe T6, L3, and L4 compression fractures with resultant severe central canal stenosis at L3 and L4. The T6 and L3 fracture are new since prior examination of 08/12/2021 and 05/21/2021, respectively. Aortic Atherosclerosis (ICD10-I70.0). Electronically Signed   By: Helyn Numbers M.D.   On: 06/29/2022 03:24   DG Chest 2 View  Result Date: 06/28/2022 CLINICAL DATA:  Shortness of breath. EXAM: CHEST - 2 VIEW COMPARISON:  Chest radiograph dated 06/21/2022. FINDINGS: Large  hiatal hernia with associated compressive atelectasis of the lung bases. Pneumonia is not excluded. No pleural effusion or pneumothorax. The cardiac borders are silhouetted. Osteopenia with degenerative changes of the spine. No acute osseous pathology. IMPRESSION: Large hiatal hernia with associated compressive atelectasis of the lung bases. Electronically Signed   By: Elgie Collard M.D.   On: 06/28/2022 23:54    Assessment/Plan: 89 year old debilitated with history of dementia with significant dependence on ADLs presents with exam paraesophageal hernia causing some large bowel obstruction.  Very difficult situation.  I think the patient is debilitated and her mental status probably want to allow for to be a safe surgical candidate.  Furthermore I talked to the daughter [name] who is also concerned about her mother.  She states that she has seen her mother suffering lately and they I wonder if she is ready for hospice.  When asked the patient she seems to be competent but seems that she has some lucid and not so lucids episodes.  She is clear that she does not want any surgical intervention.  Also introduced the idea of palliative care and hospice.  They seem to like to entertain this idea.  From a surgical perspective unfortunately there is not much I can do for her as I do think that surgery would be extremely risky and likely will not provide much quality of life. Will be available.  Please note that I spent over 75 minutes in this encounter including personally reviewing imaging studies, medical records, coordinating her care and placing appropriate documentation  Sterling Big, MD [facility] General Surgeon

## 2022-06-30 DIAGNOSIS — K56609 Unspecified intestinal obstruction, unspecified as to partial versus complete obstruction: Secondary | ICD-10-CM | POA: Diagnosis not present

## 2022-06-30 LAB — CBC
HCT: 36.7 % (ref 36.0–46.0)
Hemoglobin: 11.7 g/dL — ABNORMAL LOW (ref 12.0–15.0)
MCH: 30.2 pg (ref 26.0–34.0)
MCHC: 31.9 g/dL (ref 30.0–36.0)
MCV: 94.8 fL (ref 80.0–100.0)
Platelets: 297 10*3/uL (ref 150–400)
RBC: 3.87 MIL/uL (ref 3.87–5.11)
RDW: 12.7 % (ref 11.5–15.5)
WBC: 14.6 10*3/uL — ABNORMAL HIGH (ref 4.0–10.5)
nRBC: 0 % (ref 0.0–0.2)

## 2022-06-30 LAB — BASIC METABOLIC PANEL
Anion gap: 8 (ref 5–15)
Anion gap: 8 (ref 5–15)
Anion gap: 9 (ref 5–15)
BUN: 29 mg/dL — ABNORMAL HIGH (ref 8–23)
BUN: 31 mg/dL — ABNORMAL HIGH (ref 8–23)
BUN: 32 mg/dL — ABNORMAL HIGH (ref 8–23)
CO2: 24 mmol/L (ref 22–32)
CO2: 27 mmol/L (ref 22–32)
CO2: 27 mmol/L (ref 22–32)
Calcium: 8.2 mg/dL — ABNORMAL LOW (ref 8.9–10.3)
Calcium: 8.5 mg/dL — ABNORMAL LOW (ref 8.9–10.3)
Calcium: 8.6 mg/dL — ABNORMAL LOW (ref 8.9–10.3)
Chloride: 95 mmol/L — ABNORMAL LOW (ref 98–111)
Chloride: 98 mmol/L (ref 98–111)
Chloride: 98 mmol/L (ref 98–111)
Creatinine, Ser: 1.15 mg/dL — ABNORMAL HIGH (ref 0.44–1.00)
Creatinine, Ser: 1.2 mg/dL — ABNORMAL HIGH (ref 0.44–1.00)
Creatinine, Ser: 1.22 mg/dL — ABNORMAL HIGH (ref 0.44–1.00)
GFR, Estimated: 42 mL/min — ABNORMAL LOW (ref >60)
GFR, Estimated: 43 mL/min — ABNORMAL LOW (ref >60)
GFR, Estimated: 45 mL/min — ABNORMAL LOW (ref >60)
Glucose, Bld: 100 mg/dL — ABNORMAL HIGH (ref 70–99)
Glucose, Bld: 83 mg/dL (ref 70–99)
Glucose, Bld: 87 mg/dL (ref 70–99)
Potassium: 4 mmol/L (ref 3.5–5.1)
Potassium: 4.1 mmol/L (ref 3.5–5.1)
Potassium: 4.2 mmol/L (ref 3.5–5.1)
Sodium: 130 mmol/L — ABNORMAL LOW (ref 135–145)
Sodium: 131 mmol/L — ABNORMAL LOW (ref 135–145)
Sodium: 133 mmol/L — ABNORMAL LOW (ref 135–145)

## 2022-06-30 LAB — GLUCOSE, CAPILLARY: Glucose-Capillary: 91 mg/dL (ref 70–99)

## 2022-06-30 MED ORDER — HEPARIN SODIUM (PORCINE) 5000 UNIT/ML IJ SOLN
5000.0000 [IU] | Freq: Three times a day (TID) | INTRAMUSCULAR | Status: DC
  Administered 2022-06-30 – 2022-07-02 (×6): 5000 [IU] via SUBCUTANEOUS
  Filled 2022-06-30 (×6): qty 1

## 2022-06-30 NOTE — Progress Notes (Signed)
PROGRESS NOTE    Theresa Snyder   WUJ:[id number] DOB: [birth year]  DOA: 06/29/2022 Date of Service: 06/30/22 PCP: [facility]     Brief Narrative / Hospital Course:  Theresa Snyder is a 89 y.o. female with medical history significant of hiatal hernia, hypertension, hyperlipidemia, diastolic CHF, hypothyroidism, NFA-2Z, dementia, spinal stenosis, who presents to ED 06/28/2022 from SNF [facility]) via EMS with abdominal pain worsening x2 days, and shortness of breath.   Recent treatment for pneumonia,  05/20: WBC 10.7, slightly worsening renal function, sodium 126, oxygen saturation 86% on room air, which improved to 100% on 2 L oxygen. Chest x-ray showed large hiatal hernia and atelectasis. CTA negative for PE, does showed left fifth rib fracture. CT abdomen/pelvis that showed large hiatal hernia with large bowel obstruction. Admitted to hospitalist service, General Surgery consulted. Pt reports not wanting surgery and at any rate is not good surgical candidate, family interested in hospice discussions, palliative care consulted.  05/21: small BM and pain improved but still present. Palliative consult pending.  Consultants:  General Surgery Palliative Care   Procedures: None       ASSESSMENT & PLAN:   Principal Problem:   Large bowel obstruction (HCC) Active Problems:   Hiatal hernia   Chronic diastolic CHF (congestive heart failure) (HCC)   SOB (shortness of breath)   Hyperlipidemia   Hyponatremia   HTN (hypertension)   Hypothyroidism   Chronic kidney disease, stage 3a (HCC)   Left rib fracture   Dementia without behavioral disturbance (HCC)  Large bowel obstruction due to large hiatal hernia:  Dr. Everlene Farrier (gen surg) notes pt not a good candidate for surgery Family is interested in palliative care.  pain control to minimize opiates as able  NPO advance to CLD today since had small BM Palliative consult  IV fluid: s/p 1 L LR, now at 75  cc/h   Chronic diastolic CHF (congestive heart failure) (HCC):  2D echo on 08/13/2021 showed EF of 60 to 65% with grade 2 diastolic dysfunction.  Patient has trace leg edema, no JVD, BNP 131, does not seem to have CHF exacerbation. Held Lasix due to hyponatremia and worsening renal function Closely monitor fluid status w/ IV fluids    SOB (shortness of breath) Acute hypoxic respiratory failure  Possibly due to large hiatal hernia causing atelectasis.   As needed albuterol and Mucinex Nasal cannula oxygen to maintain oxygen saturation above 93%   Hyperlipidemia Pravastatin   Hyponatremia: improving Sodium 126 on admission, likely due to decreased oral intake and dehydration Sodium today 133 IV fluid as above   HTN (hypertension) Hold Lasix Amlodipine, metoprolol IV hydralazine as needed   Hypothyroidism Synthroid   Chronic kidney disease, stage 3a (HCC):  Slightly worsened than baseline.  Recent baseline creatinine 0.97 on 06/26/2022.  Her creatinine is 1.27, BUN 21, GFR 40. Follow-up BMP   Left rib fracture:  Patient has left fifth rib fracture Pain control Incentive spirometry   Dementia without behavioral disturbance (HCC) Fall precaution Requip at night    DVT prophylaxis: heparin Pertinent IV fluids/nutrition: slow IV fluids, CLD Central lines / invasive devices: none  Code Status: FULL CODE ACP documentation reviewed: 06/30/22 none on file [name]   Current Admission Status: inpatient  TOC needs / Dispo plan: anticiapte back to SNF when stable Barriers to discharge / significant pending items: advance diet, if having BM and toelrating diet may be able to d/c tomorrow pending palliative consult  Subjective / Brief ROS:  Patient reports small BM, requests enema Denies CP/SOB.  Pain in shoulder and abdomen  Denies new weakness..  Reports no concerns w/ urination.   Family Communication: spoke on phone w/ son 06/30/22 4:40 PM he had  some questions about when palliative care would see the patient     Objective Findings:  Vitals:   06/29/22 1818 06/30/22 0500 06/30/22 0519 06/30/22 0847  BP: 125/70  136/60 (!) 143/60  Pulse: 60  74 78  Resp: 12  14 18   Temp: 97.6 F (36.4 C)  (!) 97.5 F (36.4 C) 97.9 F (36.6 C)  TempSrc:   Oral   SpO2: 94%  100% 93%  Weight:  66.1 kg    Height:        Intake/Output Summary (Last 24 hours) at 06/30/2022 1640 Last data filed at 06/30/2022 0410 Gross per 24 hour  Intake 1330 ml  Output --  Net 1330 ml   Filed Weights   06/28/22 2119 06/30/22 0500  Weight: 63.9 kg 66.1 kg    Examination:  Physical Exam Constitutional:      General: She is not in acute distress. Cardiovascular:     Rate and Rhythm: Normal rate and regular rhythm.  Pulmonary:     Effort: Pulmonary effort is normal.     Breath sounds: Normal breath sounds.  Abdominal:     General: Bowel sounds are normal.     Palpations: Abdomen is soft.     Tenderness: There is abdominal tenderness in the epigastric area. There is no guarding.  Skin:    General: Skin is warm and dry.  Neurological:     General: No focal deficit present.     Mental Status: She is alert.  Psychiatric:        Mood and Affect: Mood normal.        Behavior: Behavior normal.          Scheduled Medications:   amLODipine  10 mg Oral Q breakfast   cholecalciferol  1,000 Units Oral Daily   gabapentin  600 mg Oral QHS   heparin injection (subcutaneous)  5,000 Units Subcutaneous Q8H   levothyroxine  100 mcg Oral Daily   lidocaine  1 patch Transdermal Q24H   metoprolol tartrate  25 mg Oral BID   multivitamin with minerals  1 tablet Oral Daily   pravastatin  20 mg Oral QHS   rOPINIRole  0.25 mg Oral QHS   senna-docusate  1 tablet Oral Daily    Continuous Infusions:  sodium chloride 75 mL/hr at 06/30/22 0553    PRN Medications:  acetaminophen, acetaminophen, albuterol, dextromethorphan-guaiFENesin, docusate sodium,  hydrALAZINE, morphine injection, ondansetron (ZOFRAN) IV, oxyCODONE-acetaminophen, polyethylene glycol  Antimicrobials from admission:  Anti-infectives (From admission, onward)    None           Data Reviewed:  I have personally reviewed the following...  CBC: Recent Labs  Lab 06/24/22 2325 06/25/22 0948 06/26/22 0515 06/28/22 2122 06/30/22 0523  WBC 9.7 9.6 9.8 10.7* 14.6*  NEUTROABS  --   --  5.6  --   --   HGB 11.1* 12.7 11.8* 13.7 11.7*  HCT 32.5* 37.0 35.6* 42.2 36.7  MCV 89.3 88.9 92.0 93.0 94.8  PLT 301 304 312 349 297   Basic Metabolic Panel: Recent Labs  Lab 06/26/22 0515 06/28/22 2122 06/29/22 1019 06/29/22 1702 06/30/22 0036 06/30/22 0523 06/30/22 1135  NA 129*   < > 129* 129* 130* 133* 131*  K 4.1   < >  3.9 3.9 4.1 4.2 4.0  CL 99   < > 96* 95* 95* 98 98  CO2 23   < > 25 26 27 27 24   GLUCOSE 96   < > 108* 109* 100* 87 83  BUN 16   < > 29* 31* 32* 31* 29*  CREATININE 0.97   < > 1.33* 1.35* 1.22* 1.20* 1.15*  CALCIUM 8.5*   < > 8.4* 8.5* 8.2* 8.6* 8.5*  MG 1.5*  --   --   --   --   --   --   PHOS 3.1  --   --   --   --   --   --    < > = values in this interval not displayed.   GFR: Estimated Creatinine Clearance: 27.6 mL/min (A) (by C-G formula based on SCr of 1.15 mg/dL (H)). Liver Function Tests: Recent Labs  Lab 06/28/22 2122  AST 24  ALT 15  ALKPHOS 116  BILITOT 0.7  PROT 7.0  ALBUMIN 3.8   Recent Labs  Lab 06/28/22 2122  LIPASE 74*   No results for input(s): "AMMONIA" in the last 168 hours. Coagulation Profile: Recent Labs  Lab 06/24/22 0619 06/25/22 0948 06/29/22 1250  INR 1.0 0.9 1.0   Cardiac Enzymes: No results for input(s): "CKTOTAL", "CKMB", "CKMBINDEX", "TROPONINI" in the last 168 hours. BNP (last 3 results) No results for input(s): "PROBNP" in the last 8760 hours. HbA1C: No results for input(s): "HGBA1C" in the last 72 hours. CBG: Recent Labs  Lab 06/24/22 2238 06/30/22 0845  GLUCAP 137* 91   Lipid  Profile: No results for input(s): "CHOL", "HDL", "LDLCALC", "TRIG", "CHOLHDL", "LDLDIRECT" in the last 72 hours. Thyroid Function Tests: No results for input(s): "TSH", "T4TOTAL", "FREET4", "T3FREE", "THYROIDAB" in the last 72 hours. Anemia Panel: No results for input(s): "VITAMINB12", "FOLATE", "FERRITIN", "TIBC", "IRON", "RETICCTPCT" in the last 72 hours. Most Recent Urinalysis On File:     Component Value Date/Time   COLORURINE YELLOW (A) 06/25/2022 0015   APPEARANCEUR CLEAR (A) 06/25/2022 0015   APPEARANCEUR Clear 11/21/2012 2330   LABSPEC 1.016 06/25/2022 0015   LABSPEC 1.004 11/21/2012 2330   PHURINE 5.0 06/25/2022 0015   GLUCOSEU NEGATIVE 06/25/2022 0015   GLUCOSEU Negative 11/21/2012 2330   HGBUR NEGATIVE 06/25/2022 0015   BILIRUBINUR NEGATIVE 06/25/2022 0015   BILIRUBINUR Negative 11/21/2012 2330   KETONESUR NEGATIVE 06/25/2022 0015   PROTEINUR 30 (A) 06/25/2022 0015   NITRITE NEGATIVE 06/25/2022 0015   LEUKOCYTESUR NEGATIVE 06/25/2022 0015   LEUKOCYTESUR Negative 11/21/2012 2330   Sepsis Labs: @LABRCNTIP (procalcitonin:4,lacticidven:4) Microbiology: No results found for this or any previous visit (from the past 240 hour(s)).    Radiology Studies last 3 days: CT Angio Chest PE W/Cm &/Or Wo Cm  Result Date: 06/29/2022 CLINICAL DATA:  Pulmonary embolism (PE) suspected, high prob; Abdominal pain, acute, nonlocalized EXAM: CT ANGIOGRAPHY CHEST CT ABDOMEN AND PELVIS WITH CONTRAST TECHNIQUE: Multidetector CT imaging of the chest was performed using the standard protocol during bolus administration of intravenous contrast. Multiplanar CT image reconstructions and MIPs were obtained to evaluate the vascular anatomy. Multidetector CT imaging of the abdomen and pelvis was performed using the standard protocol during bolus administration of intravenous contrast. RADIATION DOSE REDUCTION: This exam was performed according to the departmental dose-optimization program which includes  automated exposure control, adjustment of the mA and/or kV according to patient size and/or use of iterative reconstruction technique. CONTRAST:  75mL OMNIPAQUE IOHEXOL 350 MG/ML SOLN COMPARISON:  CT chest 08/12/2021, CT  abdomen pelvis 05/21/2021 FINDINGS: CTA CHEST FINDINGS Cardiovascular: There is adequate opacification of the pulmonary arterial tree. No intraluminal filling defect identified to suggest acute pulmonary embolism. The central pulmonary arteries are of normal caliber. Extensive multi-vessel coronary artery calcification. Calcification of the mitral valve leaflets. Global cardiac size within normal limits. No pericardial effusion. Mild atherosclerotic calcification within the thoracic aorta. No aortic aneurysm. Mediastinum/Nodes: The thyroid gland is atrophic. No pathologic thoracic adenopathy. The esophagus is distally fluid-filled which may relate to esophageal dysmotility or gastroesophageal reflux. A large hiatal hernia is again identified, similar to prior examination containing the transverse colon and stomach. Lungs/Pleura: Small bilateral pleural effusions are present. This in combination with a large hiatal hernia resultant compressive atelectasis of the lower lobes bilaterally. No superimposed confluent pulmonary infiltrate. Mild right apical pleuroparenchymal scarring. No pneumothorax. No central obstructing lesion. Musculoskeletal: There is an acute fracture of the left fifth rib posteriorly. Numerous healed bilateral rib fractures are identified. Posttraumatic deformity of the right scapula noted. Interval development of a remote appearing severe T6 compression deformity with 60-70% loss of height. Accentuated thoracic kyphosis. Degenerative changes are seen throughout the thoracic spine. Review of the MIP images confirms the above findings. CT ABDOMEN and PELVIS FINDINGS Hepatobiliary: No focal liver abnormality is seen. No gallstones, gallbladder wall thickening, or biliary dilatation.  Pancreas: Unremarkable Spleen: Unremarkable Adrenals/Urinary Tract: The adrenal glands are unremarkable. The kidneys are atrophic bilaterally, left greater than right. Simple cortical cysts are seen within the kidneys bilaterally for which no follow-up imaging is recommended. The kidneys are otherwise unremarkable. The bladder is unremarkable. Stomach/Bowel: There is large volume stool within the ascending and herniated transverse colon within the thorax with an abrupt caliber change as the colon exits the hernia sac best seen at axial image # 31/5 in keeping with a partial large bowel obstruction. The distal colon is largely decompressed. Moderate sigmoid diverticulosis. The stomach, small bowel, and large bowel are otherwise unremarkable. The appendix is not clearly identified and is likely absent. No free intraperitoneal gas or fluid. Vascular/Lymphatic: Aortic atherosclerosis. No enlarged abdominal or pelvic lymph nodes. Reproductive: 2 simple cysts within the right ovary measure up to 2 cm in greatest dimension. The pelvic organs are otherwise unremarkable. Other: No abdominal wall hernia. Musculoskeletal: Left bipolar hemiarthroplasty has been performed. Osseous structures are diffusely osteopenic. Remote severe compression deformities of L3 and L4 are noted with retropulsion of the posterosuperior aspect of these vertebral bodies and resultant severe central canal stenosis at L3. While remote appearing, the L3 fracture is new since prior examination of 05/21/2021. Grade 1 anterolisthesis of L4 upon L5 in combination with degenerative changes results in severe central canal stenosis at this level. No acute bone abnormality. Review of the MIP images confirms the above findings. IMPRESSION: 1. No pulmonary embolism. 2. Extensive multi-vessel coronary artery calcification. 3. Acute fracture of the left fifth rib posteriorly. No pneumothorax. 4. Large hiatal hernia containing the transverse colon and stomach.  Associated partial large bowel obstruction with large volume stool within the ascending and herniated transverse colon within the thorax with transition point as the transverse colon exits the hernia sac within the left upper quadrant. 5. Moderate sigmoid diverticulosis without superimposed acute inflammatory change. 6. Remote appearing severe T6, L3, and L4 compression fractures with resultant severe central canal stenosis at L3 and L4. The T6 and L3 fracture are new since prior examination of 08/12/2021 and 05/21/2021, respectively. Aortic Atherosclerosis (ICD10-I70.0). Electronically Signed   By: Lyda Kalata.D.  On: 06/29/2022 03:24   CT ABDOMEN PELVIS W CONTRAST  Result Date: 06/29/2022 CLINICAL DATA:  Pulmonary embolism (PE) suspected, high prob; Abdominal pain, acute, nonlocalized EXAM: CT ANGIOGRAPHY CHEST CT ABDOMEN AND PELVIS WITH CONTRAST TECHNIQUE: Multidetector CT imaging of the chest was performed using the standard protocol during bolus administration of intravenous contrast. Multiplanar CT image reconstructions and MIPs were obtained to evaluate the vascular anatomy. Multidetector CT imaging of the abdomen and pelvis was performed using the standard protocol during bolus administration of intravenous contrast. RADIATION DOSE REDUCTION: This exam was performed according to the departmental dose-optimization program which includes automated exposure control, adjustment of the mA and/or kV according to patient size and/or use of iterative reconstruction technique. CONTRAST:  75mL OMNIPAQUE IOHEXOL 350 MG/ML SOLN COMPARISON:  CT chest 08/12/2021, CT abdomen pelvis 05/21/2021 FINDINGS: CTA CHEST FINDINGS Cardiovascular: There is adequate opacification of the pulmonary arterial tree. No intraluminal filling defect identified to suggest acute pulmonary embolism. The central pulmonary arteries are of normal caliber. Extensive multi-vessel coronary artery calcification. Calcification of the mitral  valve leaflets. Global cardiac size within normal limits. No pericardial effusion. Mild atherosclerotic calcification within the thoracic aorta. No aortic aneurysm. Mediastinum/Nodes: The thyroid gland is atrophic. No pathologic thoracic adenopathy. The esophagus is distally fluid-filled which may relate to esophageal dysmotility or gastroesophageal reflux. A large hiatal hernia is again identified, similar to prior examination containing the transverse colon and stomach. Lungs/Pleura: Small bilateral pleural effusions are present. This in combination with a large hiatal hernia resultant compressive atelectasis of the lower lobes bilaterally. No superimposed confluent pulmonary infiltrate. Mild right apical pleuroparenchymal scarring. No pneumothorax. No central obstructing lesion. Musculoskeletal: There is an acute fracture of the left fifth rib posteriorly. Numerous healed bilateral rib fractures are identified. Posttraumatic deformity of the right scapula noted. Interval development of a remote appearing severe T6 compression deformity with 60-70% loss of height. Accentuated thoracic kyphosis. Degenerative changes are seen throughout the thoracic spine. Review of the MIP images confirms the above findings. CT ABDOMEN and PELVIS FINDINGS Hepatobiliary: No focal liver abnormality is seen. No gallstones, gallbladder wall thickening, or biliary dilatation. Pancreas: Unremarkable Spleen: Unremarkable Adrenals/Urinary Tract: The adrenal glands are unremarkable. The kidneys are atrophic bilaterally, left greater than right. Simple cortical cysts are seen within the kidneys bilaterally for which no follow-up imaging is recommended. The kidneys are otherwise unremarkable. The bladder is unremarkable. Stomach/Bowel: There is large volume stool within the ascending and herniated transverse colon within the thorax with an abrupt caliber change as the colon exits the hernia sac best seen at axial image # 31/5 in keeping with  a partial large bowel obstruction. The distal colon is largely decompressed. Moderate sigmoid diverticulosis. The stomach, small bowel, and large bowel are otherwise unremarkable. The appendix is not clearly identified and is likely absent. No free intraperitoneal gas or fluid. Vascular/Lymphatic: Aortic atherosclerosis. No enlarged abdominal or pelvic lymph nodes. Reproductive: 2 simple cysts within the right ovary measure up to 2 cm in greatest dimension. The pelvic organs are otherwise unremarkable. Other: No abdominal wall hernia. Musculoskeletal: Left bipolar hemiarthroplasty has been performed. Osseous structures are diffusely osteopenic. Remote severe compression deformities of L3 and L4 are noted with retropulsion of the posterosuperior aspect of these vertebral bodies and resultant severe central canal stenosis at L3. While remote appearing, the L3 fracture is new since prior examination of 05/21/2021. Grade 1 anterolisthesis of L4 upon L5 in combination with degenerative changes results in severe central canal stenosis at this level. No  acute bone abnormality. Review of the MIP images confirms the above findings. IMPRESSION: 1. No pulmonary embolism. 2. Extensive multi-vessel coronary artery calcification. 3. Acute fracture of the left fifth rib posteriorly. No pneumothorax. 4. Large hiatal hernia containing the transverse colon and stomach. Associated partial large bowel obstruction with large volume stool within the ascending and herniated transverse colon within the thorax with transition point as the transverse colon exits the hernia sac within the left upper quadrant. 5. Moderate sigmoid diverticulosis without superimposed acute inflammatory change. 6. Remote appearing severe T6, L3, and L4 compression fractures with resultant severe central canal stenosis at L3 and L4. The T6 and L3 fracture are new since prior examination of 08/12/2021 and 05/21/2021, respectively. Aortic Atherosclerosis  (ICD10-I70.0). Electronically Signed   By: Helyn Numbers M.D.   On: 06/29/2022 03:24   DG Chest 2 View  Result Date: 06/28/2022 CLINICAL DATA:  Shortness of breath. EXAM: CHEST - 2 VIEW COMPARISON:  Chest radiograph dated 06/21/2022. FINDINGS: Large hiatal hernia with associated compressive atelectasis of the lung bases. Pneumonia is not excluded. No pleural effusion or pneumothorax. The cardiac borders are silhouetted. Osteopenia with degenerative changes of the spine. No acute osseous pathology. IMPRESSION: Large hiatal hernia with associated compressive atelectasis of the lung bases. Electronically Signed   By: Elgie Collard M.D.   On: 06/28/2022 23:54             LOS: 1 day       Sunnie Nielsen, DO [facility] 06/30/2022, 4:40 PM    Dictation software may have been used to generate the above note. Typos may occur and escape review in typed/dictated notes. Please contact Dr Lyn Hollingshead directly for clarity if needed.  Staff may message me via secure chat in Epic  but this may not receive an immediate response,  please page me for urgent matters!  If 7PM-7AM, please contact night coverage [email or web address]

## 2022-06-30 NOTE — Hospital Course (Addendum)
Theresa Snyder is a 89 y.o. female with medical history significant of hiatal hernia, hypertension, hyperlipidemia, diastolic CHF, hypothyroidism, ZOX-0R, dementia, spinal stenosis, who presents to ED 06/28/2022 from SNF [facility]) via EMS with abdominal pain worsening x2 days, and shortness of breath.   Recent treatment for pneumonia,  05/20: WBC 10.7, slightly worsening renal function, sodium 126, oxygen saturation 86% on room air, which improved to 100% on 2 L oxygen. Chest x-ray showed large hiatal hernia and atelectasis. CTA negative for PE, does showed left fifth rib fracture. CT abdomen/pelvis that showed large hiatal hernia with large bowel obstruction. Admitted to hospitalist service, General Surgery consulted. Pt reports not wanting surgery and at any rate is not good surgical candidate, family interested in hospice discussions, palliative care consulted.  05/21: small BM and pain improved but still present. Palliative consult pending.  Consultants:  General Surgery Palliative Care   Procedures: None       ASSESSMENT & PLAN:   Principal Problem:   Large bowel obstruction (HCC) Active Problems:   Hiatal hernia   Chronic diastolic CHF (congestive heart failure) (HCC)   SOB (shortness of breath)   Hyperlipidemia   Hyponatremia   HTN (hypertension)   Hypothyroidism   Chronic kidney disease, stage 3a (HCC)   Left rib fracture   Dementia without behavioral disturbance (HCC)  Large bowel obstruction due to large hiatal hernia:  Dr. Everlene Farrier (gen surg) notes pt not a good candidate for surgery Family is interested in palliative care.  pain control to minimize opiates as able  NPO advance to CLD today since had small BM Palliative consult  IV fluid: s/p 1 L LR, now at 75 cc/h   Chronic diastolic CHF (congestive heart failure) (HCC):  2D echo on 08/13/2021 showed EF of 60 to 65% with grade 2 diastolic dysfunction.  Patient has trace leg edema, no JVD, BNP 131, does not  seem to have CHF exacerbation. Held Lasix due to hyponatremia and worsening renal function Closely monitor fluid status w/ IV fluids    SOB (shortness of breath) Acute hypoxic respiratory failure  Possibly due to large hiatal hernia causing atelectasis.   As needed albuterol and Mucinex Nasal cannula oxygen to maintain oxygen saturation above 93%   Hyperlipidemia Pravastatin   Hyponatremia: improving Sodium 126 on admission, likely due to decreased oral intake and dehydration Sodium today 133 IV fluid as above   HTN (hypertension) Hold Lasix Amlodipine, metoprolol IV hydralazine as needed   Hypothyroidism Synthroid   Chronic kidney disease, stage 3a (HCC):  Slightly worsened than baseline.  Recent baseline creatinine 0.97 on 06/26/2022.  Her creatinine is 1.27, BUN 21, GFR 40. Follow-up BMP   Left rib fracture:  Patient has left fifth rib fracture Pain control Incentive spirometry   Dementia without behavioral disturbance (HCC) Fall precaution Requip at night    DVT prophylaxis: heparin Pertinent IV fluids/nutrition: slow IV fluids, CLD Central lines / invasive devices: none  Code Status: FULL CODE ACP documentation reviewed: 06/30/22 none on file [name]   Current Admission Status: inpatient  TOC needs / Dispo plan: anticiapte back to SNF when stable Barriers to discharge / significant pending items: advance diet, if having BM and toelrating diet may be able to d/c tomorrow pending palliative consult

## 2022-06-30 NOTE — TOC CM/SW Note (Signed)
Patient is a long-term resident at [facility]. Palliative consulted yesterday. Will engage patient and family once they have been seen.  Charlynn Court, CSW [phone]

## 2022-06-30 NOTE — Progress Notes (Signed)
Nutrition Brief Note  Chart reviewed. Per general surgery notes, pt is at high risk for surgery and minimal options available at this time. Pt remains NPO. Family considering palliative/ hospice care.  No further nutrition interventions planned at this time.  Please re-consult as needed.   Levada Schilling, RD, LDN, CDCES Registered Dietitian II Certified Diabetes Care and Education Specialist Please refer to [facility] for RD and/or RD on-call/weekend/after hours pager

## 2022-07-01 DIAGNOSIS — Z515 Encounter for palliative care: Secondary | ICD-10-CM | POA: Diagnosis not present

## 2022-07-01 DIAGNOSIS — Z7189 Other specified counseling: Secondary | ICD-10-CM | POA: Diagnosis not present

## 2022-07-01 DIAGNOSIS — K56609 Unspecified intestinal obstruction, unspecified as to partial versus complete obstruction: Secondary | ICD-10-CM | POA: Diagnosis not present

## 2022-07-01 DIAGNOSIS — F0390 Unspecified dementia without behavioral disturbance: Secondary | ICD-10-CM | POA: Diagnosis not present

## 2022-07-01 LAB — GLUCOSE, CAPILLARY: Glucose-Capillary: 100 mg/dL — ABNORMAL HIGH (ref 70–99)

## 2022-07-01 LAB — BASIC METABOLIC PANEL
Anion gap: 8 (ref 5–15)
BUN: 29 mg/dL — ABNORMAL HIGH (ref 8–23)
CO2: 26 mmol/L (ref 22–32)
Calcium: 8.4 mg/dL — ABNORMAL LOW (ref 8.9–10.3)
Chloride: 100 mmol/L (ref 98–111)
Creatinine, Ser: 1.03 mg/dL — ABNORMAL HIGH (ref 0.44–1.00)
GFR, Estimated: 52 mL/min — ABNORMAL LOW (ref >60)
Glucose, Bld: 91 mg/dL (ref 70–99)
Potassium: 4.2 mmol/L (ref 3.5–5.1)
Sodium: 134 mmol/L — ABNORMAL LOW (ref 135–145)

## 2022-07-01 NOTE — Progress Notes (Signed)
Patient complaint of SHOB, dyspnea and back pain. Vital signs checked O2 sats 92-97% Room Air, HR 76, lungs diminished,  PRN for pain given. MD made aware and ordered EKG.

## 2022-07-01 NOTE — Consult Note (Signed)
Consultation Note Date: 07/01/2022   Patient Name: Theresa Snyder  DOB: Apr 04, 1929  MRN: [id number]  Age / Sex: 89 y.o., female  PCP: [facility] Referring Physician: Loyce Dys, MD  Reason for Consultation: Establishing goals of care  HPI/Patient Profile: 89 y.o. female  with past medical history of hiatal hernia, hypertension, hyperlipidemia, diastolic CHF, hypothyroidism, YQM-5H, dementia, and spinal stenosis admitted on 06/29/2022 with worsening abdominal pain.  Patient found to have large partial bowel obstruction due to large hiatal hernia.  Deemed not a good surgery candidate.  PMT consulted to discuss goals of care.  Clinical Assessment and Goals of Care: I have reviewed medical records including EPIC notes, labs and imaging, assessed the patient and then met with patient and her daughter [name] to discuss diagnosis prognosis, GOC, EOL wishes, disposition and options.  Met with patient initially.  Daughter reports patient has been confused -this morning she did not remember she was in the hospital, thought she was somewhere else.  Discussed symptoms with patient.  She tells me her nausea is better and her pain is also better.  She tells me she is hoping to avoid pain medications as they make her dizzy.  She feels her symptoms are well-controlled. She tells me she is tolerating liquid diet but really doesn't feel like eating much. Had large bowel movement (per her report).  Called to daughter [name].  I introduced Palliative Medicine as specialized medical care for people living with serious illness. It focuses on providing relief from the symptoms and stress of a serious illness. The goal is to improve quality of life for both the patient and the family.  Daughter shares patient has been at [facility] for several months.  She tells me she has still been ambulatory there.  She tells me of weight loss and patient  only eating 1 meal a day due to her hiatal hernia.  She tells me about worsening cognitive decline.   We discussed patient's current illness and what it means in the larger context of patient's on-going co-morbidities.  Natural disease trajectory and expectations at EOL were discussed.  We discussed patient is not a surgical candidate.  I attempted to elicit values and goals of care important to the patient.  [name] shares that she wants to focus on comfort and managing patient's symptoms.  The difference between aggressive medical intervention and comfort care was considered in light of the patient's goals of care.  [name] would like to avoid aggressive medical interventions and focus on comfort.  Encouraged patient/family to consider DNR/DNI status understanding evidenced based poor outcomes in similar hospitalized patients, as the cause of the arrest is likely associated with chronic/terminal disease rather than a reversible acute cardio-pulmonary event.  [name] agrees to DNR/DNI.  Discussed with [name] the importance of continued conversation with family and the medical providers regarding overall plan of care and treatment options, ensuring decisions are within the context of the patient's values and GOCs.    Hospice Care services outpatient were explained and offered.  [name] would like patient to return to [facility] with hospice support.  Questions and concerns were addressed. The family was encouraged to call with questions or concerns.  Primary Decision Maker NEXT OF KIN -daughter [name]    SUMMARY OF RECOMMENDATIONS   -Focus on comfort, avoid aggressive medical interventions, return to long-term care facility with hospice support -Patient currently reports that symptoms are well-controlled  - if nausea becomes issue consider use of haldol  -  could also consider use of anticholinergics (scopolamine, atropine, robinul) if colicky abdominal pain returns  - will hold off now given  patient feeling comfortable - code status change to DNR  Code Status/Advance Care Planning: DNR  Discharge Planning: Skilled Nursing Facility with Hospice      Primary Diagnoses: Present on Admission:  Large bowel obstruction (HCC)  Hyponatremia  Hyperlipidemia  Chronic diastolic CHF (congestive heart failure) (HCC)  Dementia without behavioral disturbance (HCC)  HTN (hypertension)  Hypothyroidism  Chronic kidney disease, stage 3a (HCC)  Left rib fracture  SOB (shortness of breath)   I have reviewed the medical record, interviewed the patient and family, and examined the patient. The following aspects are pertinent.  Past Medical History:  Diagnosis Date   Arthritis    Cancer (HCC)    skin   Chronic kidney disease    history of renal insufficiency   History of hiatal hernia    Hypercholesteremia    Hypertension    Hypothyroidism    Lymphedema of leg    Pneumonia    in the past   Seizures (HCC) 2014   no seizures since then   Spinal stenosis    Social History   Socioeconomic History   Marital status: Divorced    Spouse name: Not on file   Number of children: Not on file   Years of education: Not on file   Highest education level: Not on file  Occupational History   Not on file  Tobacco Use   Smoking status: Never   Smokeless tobacco: Never  Vaping Use   Vaping Use: Never used  Substance and Sexual Activity   Alcohol use: No    Alcohol/week: 0.0 standard drinks of alcohol   Drug use: No   Sexual activity: Not on file  Other Topics Concern   Not on file  Social History Narrative   Not on file   Social Determinants of Health   Financial Resource Strain: Not on file  Food Insecurity: No Food Insecurity (06/29/2022)   Hunger Vital Sign    Worried About Running Out of Food in the Last Year: Never true    Ran Out of Food in the Last Year: Never true  Transportation Needs: No Transportation Needs (06/29/2022)   PRAPARE - Scientist, research (physical sciences) (Medical): No    Lack of Transportation (Non-Medical): No  Physical Activity: Not on file  Stress: Not on file  Social Connections: Not on file   Family History  Problem Relation Age of Onset   Stroke Mother    Heart attack Mother    Stroke Father    Hypertension Father    Scheduled Meds:  amLODipine  10 mg Oral Q breakfast   cholecalciferol  1,000 Units Oral Daily   gabapentin  600 mg Oral QHS   heparin injection (subcutaneous)  5,000 Units Subcutaneous Q8H   levothyroxine  100 mcg Oral Daily   lidocaine  1 patch Transdermal Q24H   metoprolol tartrate  25 mg Oral BID   multivitamin with minerals  1 tablet Oral Daily   pravastatin  20 mg Oral QHS   rOPINIRole  0.25 mg Oral QHS   senna-docusate  1 tablet Oral Daily   Continuous Infusions:  sodium chloride 75 mL/hr at 07/01/22 1044   PRN Meds:.acetaminophen, acetaminophen, albuterol, dextromethorphan-guaiFENesin, docusate sodium, hydrALAZINE, morphine injection, ondansetron (ZOFRAN) IV, oxyCODONE-acetaminophen, polyethylene glycol Allergies  Allergen Reactions   Iodinated Contrast Media Other (See Comments)    Blue,  Green and Red dyes    Lorazepam     Altered mental state   Risperidone Other (See Comments)    Altered mental state    Sulfamethoxazole-Trimethoprim     Hyper   Review of Systems  Constitutional:  Positive for activity change, appetite change and fatigue.  Respiratory:  Negative for shortness of breath.   Gastrointestinal:  Negative for abdominal pain and nausea.  Neurological:  Positive for weakness.    Physical Exam Constitutional:      General: She is not in acute distress.    Appearance: She is ill-appearing.  Pulmonary:     Effort: Pulmonary effort is normal.  Abdominal:     General: There is no distension.     Palpations: Abdomen is soft.     Tenderness: There is no abdominal tenderness.  Skin:    General: Skin is warm and dry.  Neurological:     Mental Status: She is alert.  She is disoriented.     Vital Signs: BP (!) 135/91 (BP Location: Left Arm)   Pulse 80   Temp 98 F (36.7 C)   Resp 18   Ht 5' (1.524 m)   Wt 67.5 kg   SpO2 96%   BMI 29.06 kg/m  Pain Scale: 0-10   Pain Score: 3    SpO2: SpO2: 96 % O2 Device:SpO2: 96 % O2 Flow Rate: .O2 Flow Rate (L/min): 1 L/min  IO: Intake/output summary:  Intake/Output Summary (Last 24 hours) at 07/01/2022 1126 Last data filed at 06/30/2022 1924 Gross per 24 hour  Intake 600 ml  Output 451 ml  Net 149 ml    LBM: Last BM Date : 06/30/22 Baseline Weight: Weight: 63.9 kg Most recent weight: Weight: 67.5 kg     Palliative Assessment/Data: PPS 20%     *Please note that this is a verbal dictation therefore any spelling or grammatical errors are due to the "Dragon Medical One" system interpretation.   Gerlean Ren, DNP, AGNP-C Palliative Medicine Team [phone] Pager: [phone]

## 2022-07-01 NOTE — TOC Initial Note (Signed)
Transition of Care [facility]) - Initial/Assessment Note    Patient Details  Name: Theresa Snyder MRN: [id number] Date of Birth: 1929/06/14  Transition of Care [facility]) CM/SW Contact:    Margarito Liner, LCSW Phone Number: 07/01/2022, 1:09 PM  Clinical Narrative:   CSW met with patient. No supports at bedside. CSW introduced role and explained that discharge planning would be discussed. Patient confirmed she is a long-term resident at [facility]. She is agreeable to hospice services through [facility] and Hospice when she returns. This is the preferred hospice agency for this facility. CSW called daughter and confirmed plan. CSW made hospice referral to [facility]. Patient and daughter are aware of plan to likely discharge tomorrow.               Expected Discharge Plan: Skilled Nursing Facility (with hospice)     Patient Goals and CMS Choice     Choice offered to / list presented to : Patient, Adult Children      Expected Discharge Plan and Services     Post Acute Care Choice: Hospice, Resumption of Svcs/PTA Provider Living arrangements for the past 2 months: Skilled Nursing Facility                                      Prior Living Arrangements/Services Living arrangements for the past 2 months: Skilled Nursing Facility Lives with:: Facility Resident Patient language and need for interpreter reviewed:: Yes Do you feel safe going back to the place where you live?: Yes      Need for Family Participation in Patient Care: Yes (Comment) Care giver support system in place?: Yes (comment)   Criminal Activity/Legal Involvement Pertinent to Current Situation/Hospitalization: No - Comment as needed  Activities of Daily Living Home Assistive Devices/Equipment: Environmental consultant (specify type), Wheelchair ADL Screening (condition at time of admission) Patient's cognitive ability adequate to safely complete daily activities?: No Is the patient deaf or have  difficulty hearing?: No Does the patient have difficulty seeing, even when wearing glasses/contacts?: No Does the patient have difficulty concentrating, remembering, or making decisions?: Yes Patient able to express need for assistance with ADLs?: Yes Does the patient have difficulty dressing or bathing?: Yes Independently performs ADLs?: No Communication: Independent Dressing (OT): Needs assistance Is this a change from baseline?: Pre-admission baseline Grooming: Needs assistance Is this a change from baseline?: Pre-admission baseline Feeding: Independent Bathing: Needs assistance Is this a change from baseline?: Pre-admission baseline Toileting: Needs assistance Is this a change from baseline?: Pre-admission baseline In/Out Bed: Needs assistance Is this a change from baseline?: Pre-admission baseline Walks in Home: Needs assistance Is this a change from baseline?: Pre-admission baseline Does the patient have difficulty walking or climbing stairs?: Yes Weakness of Legs: Both Weakness of Arms/Hands: None  Permission Sought/Granted Permission sought to share information with : Facility Medical sales representative, Family Supports Permission granted to share information with : Yes, Verbal Permission Granted  Share Information with NAME: Burman Freestone  Permission granted to share info w AGENCY: [facility], [facility]  Permission granted to share info w Relationship: Daughter  Permission granted to share info w Contact Information: [phone]  Emotional Assessment Appearance:: Appears stated age Attitude/Demeanor/Rapport: Engaged, Gracious Affect (typically observed): Accepting, Appropriate, Calm, Pleasant Orientation: : Oriented to Self, Oriented to Place, Oriented to Situation Alcohol / Substance Use: Not Applicable Psych Involvement: No (comment)  Admission diagnosis:  Hiatal hernia [K44.9] Large bowel obstruction (HCC) [K56.609] Acute respiratory  failure with hypoxia (HCC) [J96.01] Patient Active Problem List   Diagnosis Date Noted   Large bowel obstruction (HCC) 06/29/2022   HTN (hypertension) 06/29/2022   Chronic kidney disease, stage 3a (HCC) 06/29/2022   Left rib fracture 06/29/2022   SOB (shortness of breath) 06/29/2022   Compression fracture of T6 vertebra (HCC) 06/23/2022   Dementia without behavioral disturbance (HCC) 06/22/2022   Hyponatremia 06/21/2022   Left hip pain 06/21/2022   Closed fracture of right thumb 12/21/2021   Hypomagnesemia 12/21/2021   Sinus tachycardia 12/21/2021   Atrial fibrillation with RVR (HCC) 12/21/2021   Acute metabolic encephalopathy 10/31/2021   UTI (urinary tract infection) 10/31/2021   Dehydration 10/31/2021   Chronic diastolic CHF (congestive heart failure) (HCC) 10/31/2021   Multifocal pneumonia 08/13/2021   Acute on chronic diastolic CHF (congestive heart failure) (HCC) 08/13/2021   Neuropathic pain 08/13/2021   Leukocytosis 08/13/2021   Pneumonia 08/13/2021   Acute dyspnea 08/12/2021   Lymphedema of leg    Hip fracture (HCC) 07/09/2021   Colonic obstruction (HCC) 05/21/2021   Hiatal hernia 05/21/2021   Severe sepsis with lactic acidosis (HCC) 05/21/2021   Lumbar compression fracture (HCC) 03/24/2020   Fall at home, initial encounter 03/23/2020   L4 vertebral fracture (HCC) 03/23/2020   Chronic kidney disease 03/04/2015   Adaptive colitis 03/04/2015   BP (high blood pressure) 05/18/2013   Hypothyroidism 05/18/2013   Hyperlipidemia 05/18/2013   PCP:  [facility] Pharmacy:   [id number] - [place], [place] - [address] [place] [address] Phone: [phone] Fax: [phone]  [facility] - [place], [place] - [address] [place] [address] Phone: [phone] Fax: [phone]     Social Determinants of Health (SDOH) Social History: SDOH Screenings   Food Insecurity: No  Food Insecurity (06/29/2022)  Housing: Low Risk  (06/29/2022)  Transportation Needs: No Transportation Needs (06/29/2022)  Utilities: Not At Risk (06/29/2022)  Tobacco Use: Low Risk  (06/29/2022)   SDOH Interventions:     Readmission Risk Interventions    07/01/2022    1:06 PM 12/22/2021    4:06 PM  Readmission Risk Prevention Plan  Transportation Screening Complete Complete  Medication Review (RN Care Manager) Complete Referral to Pharmacy  PCP or Specialist appointment within 3-5 days of discharge Complete Complete  HRI or Home Care Consult  Complete  SW Recovery Care/Counseling Consult Complete Complete  Palliative Care Screening Complete Not Applicable  Skilled Nursing Facility Complete Complete

## 2022-07-01 NOTE — Progress Notes (Signed)
Progress Note   Patient: Theresa Snyder BJY:[id number] DOB: 08/09/1929 DOA: 06/29/2022     2 DOS: the patient was seen and examined on 07/01/2022    Subjective:  She tells her breathing is better without oxygen Denies worsening abdominal pains Able to tolerate clear liquid diet so this has been advanced to full liquid diet Palliative care have seen patient and upon discussion with family they have made decision for discharge to facility with hospice     Brief Narrative / Hospital Course:  ARIA MATTIS is a 89 y.o. female with medical history significant of hiatal hernia, hypertension, hyperlipidemia, diastolic CHF, hypothyroidism, YQM-5H, dementia, spinal stenosis, who presents to ED 06/28/2022 from SN[facility]) via EMS with abdominal pain worsening x2 days, and shortness of breath.   Recent treatment for pneumonia,  05/20: WBC 10.7, slightly worsening renal function, sodium 126, oxygen saturation 86% on room air, which improved to 100% on 2 L oxygen. Chest x-ray showed large hiatal hernia and atelectasis. CTA negative for PE, does showed left fifth rib fracture. CT abdomen/pelvis that showed large hiatal hernia with large bowel obstruction. Admitted to hospitalist service, General Surgery consulted. Pt reports not wanting surgery and at any rate is not good surgical candidate, family interested in hospice discussions, palliative care consulted.  05/21: small BM and pain improved but still present. Palliative consult pending.   Consultants:  General Surgery Palliative Care    Procedures: None     ASSESSMENT & PLAN:   Principal Problem:   Large bowel obstruction (HCC) Active Problems:   Hiatal hernia   Chronic diastolic CHF (congestive heart failure) (HCC)   SOB (shortness of breath)   Hyperlipidemia   Hyponatremia   HTN (hypertension)   Hypothyroidism   Chronic kidney disease, stage 3a (HCC)   Left rib fracture   Dementia without behavioral disturbance (HCC)    Large bowel obstruction due to large hiatal hernia:  Dr. Everlene Farrier (gen surg) notes pt not a good candidate for surgery Family is interested in palliative care.  pain control to minimize opiates as able  Diet advanced to full liquid today Palliative saw the patient and patient have agreed for discharge on hospice IV fluid: s/p 1 L LR, now at 75 cc/h   Chronic diastolic CHF (congestive heart failure) (HCC):  2D echo on 08/13/2021 showed EF of 60 to 65% with grade 2 diastolic dysfunction.  Patient has trace leg edema, no JVD, BNP 131, does not seem to have CHF exacerbation. Held Lasix due to hyponatremia and worsening renal function Closely monitor fluid status w/ IV fluids    SOB (shortness of breath) Acute hypoxic respiratory failure  Possibly due to large hiatal hernia causing atelectasis.   As needed albuterol and Mucinex Nasal cannula oxygen to maintain oxygen saturation above 93%   Hyperlipidemia Pravastatin   Hyponatremia: improving Sodium 126 on admission, likely due to decreased oral intake and dehydration IV fluid as above Monitor sodium levels   HTN (hypertension) Hold Lasix Amlodipine, metoprolol IV hydralazine as needed   Hypothyroidism Synthroid   Chronic kidney disease, stage 3a (HCC):  Slightly worsened than baseline.  Recent baseline creatinine 0.97 on 06/26/2022.  Her creatinine is 1.27, BUN 21, GFR 40. Follow-up BMP   Left rib fracture:  Patient has left fifth rib fracture Pain control Incentive spirometry   Dementia without behavioral disturbance (HCC) Fall precaution Requip at night       DVT prophylaxis: heparin    Code Status: FULL CODE  Current Admission Status: inpatient  TOC needs / Dispo plan:  back to SNF when stable  Barriers to discharge / significant pending items: advance diet, if having BM and toelrating diet may be able to d/c tomorrow     Family Communication: Discussed with patient's family   Physical  Exam Constitutional:      General: She is not in acute distress. Cardiovascular:     Rate and Rhythm: Normal rate and regular rhythm.  Pulmonary:     Effort: Pulmonary effort is normal.     Breath sounds: Normal breath sounds.  Abdominal:     General: Bowel sounds are normal.     Palpations: Abdomen is soft.  No abdominal tenderness Skin:    General: Skin is warm and dry.  Neurological:     General: No focal deficit present.     Mental Status: She is alert.  Psychiatric:        Mood and Affect: Mood normal.        Behavior: Behavior normal.       Physical Exam: Vitals:   06/30/22 2105 07/01/22 0415 07/01/22 0741 07/01/22 0934  BP:  135/70 (!) 135/91   Pulse:  70 80   Resp:  18 18   Temp:  97.8 F (36.6 C) 98 F (36.7 C)   TempSrc:  Oral    SpO2: 93% 100% 93% 96%  Weight:  67.5 kg    Height:        Data Reviewed:I have personally reviewed patient's laboratory results showing sodium 134 potassium 4.2 creatinine 1.0  Time spent: 45 minutes  Author: Loyce Dys, MD 07/01/2022 3:09 PM  For on call review [email or web address].

## 2022-07-01 NOTE — [place]2 (Signed)
[place] MEDICAID FL2 LEVEL OF CARE FORM     IDENTIFICATION  Patient Name: Theresa Snyder Birthdate: 1929/07/26 Sex: female Admission Date (Current Location): 06/29/2022  [facility] and [place] Number:  Chiropodist and Address:  [facility], [address] [place], [place] [address]      Provider Number: [id number]  Attending Physician Name and Address:  Loyce Dys, MD  Relative Name and Phone Number:       Current Level of Care: Hospital Recommended Level of Care: Skilled Nursing Facility (with [facility]) Prior Approval Number:    Date Approved/Denied:   PASRR Number: [id number]  Discharge Plan: SNF (with [facility])    Current Diagnoses: Patient Active Problem List   Diagnosis Date Noted   Large bowel obstruction (HCC) 06/29/2022   HTN (hypertension) 06/29/2022   Chronic kidney disease, stage 3a (HCC) 06/29/2022   Left rib fracture 06/29/2022   SOB (shortness of breath) 06/29/2022   Compression fracture of T6 vertebra (HCC) 06/23/2022   Dementia without behavioral disturbance (HCC) 06/22/2022   Hyponatremia 06/21/2022   Left hip pain 06/21/2022   Closed fracture of right thumb 12/21/2021   Hypomagnesemia 12/21/2021   Sinus tachycardia 12/21/2021   Atrial fibrillation with RVR (HCC) 12/21/2021   Acute metabolic encephalopathy 10/31/2021   UTI (urinary tract infection) 10/31/2021   Dehydration 10/31/2021   Chronic diastolic CHF (congestive heart failure) (HCC) 10/31/2021   Multifocal pneumonia 08/13/2021   Acute on chronic diastolic CHF (congestive heart failure) (HCC) 08/13/2021   Neuropathic pain 08/13/2021   Leukocytosis 08/13/2021   Pneumonia 08/13/2021   Acute dyspnea 08/12/2021   Lymphedema of leg    Hip fracture (HCC) 07/09/2021   Colonic obstruction (HCC) 05/21/2021   Hiatal hernia 05/21/2021   Severe sepsis with lactic acidosis (HCC) 05/21/2021   Lumbar compression fracture (HCC) 03/24/2020    Fall at home, initial encounter 03/23/2020   L4 vertebral fracture (HCC) 03/23/2020   Chronic kidney disease 03/04/2015   Adaptive colitis 03/04/2015   BP (high blood pressure) 05/18/2013   Hypothyroidism 05/18/2013   Hyperlipidemia 05/18/2013    Orientation RESPIRATION BLADDER Height & Weight     Self, Situation, Place  Normal Incontinent Weight: 148 lb 13 oz (67.5 kg) Height:  5' (152.4 cm)  BEHAVIORAL SYMPTOMS/MOOD NEUROLOGICAL BOWEL NUTRITION STATUS   (None)  (Dementia) Continent Diet (Follow for discharge recommendations. Currently on clear liquids.)  AMBULATORY STATUS COMMUNICATION OF NEEDS Skin     Verbally Bruising                       Personal Care Assistance Level of Assistance              Functional Limitations Info  Sight, Hearing, Speech Sight Info: Adequate Hearing Info: Adequate Speech Info: Adequate    SPECIAL CARE FACTORS FREQUENCY                       Contractures Contractures Info: Not present    Additional Factors Info  Code Status, Allergies Code Status Info: DNR Allergies Info: Iodinated Contrast Media, Lorazepam, Risperidone, Sulfamethoxazole-trimethoprim           Current Medications (07/01/2022):  This is the current hospital active medication list Current Facility-Administered Medications  Medication Dose Route Frequency Provider Last Rate Last Admin   0.9 %  sodium chloride infusion   Intravenous Continuous Lorretta Harp, MD 75 mL/hr at 07/01/22 1044 Restarted at 07/01/22 1044  acetaminophen (TYLENOL) suppository 650 mg  650 mg Rectal Q6H PRN Lorretta Harp, MD       acetaminophen (TYLENOL) tablet 650 mg  650 mg Oral Q6H PRN Lorretta Harp, MD   650 mg at 06/30/22 1323   albuterol (PROVENTIL) (2.5 MG/3ML) 0.083% nebulizer solution 3 mL  3 mL Inhalation Q4H PRN Lorretta Harp, MD       amLODipine (NORVASC) tablet 10 mg  10 mg Oral Q breakfast Lorretta Harp, MD   10 mg at 07/01/22 3244   cholecalciferol (VITAMIN D3) 25 MCG (1000 UNIT)  tablet 1,000 Units  1,000 Units Oral Daily Lorretta Harp, MD   1,000 Units at 07/01/22 0931   dextromethorphan-guaiFENesin (MUCINEX DM) 30-600 MG per 12 hr tablet 1 tablet  1 tablet Oral BID PRN Lorretta Harp, MD       docusate sodium (COLACE) capsule 100 mg  100 mg Oral BID PRN Lorretta Harp, MD       gabapentin (NEURONTIN) capsule 600 mg  600 mg Oral QHS Lorretta Harp, MD   600 mg at 06/30/22 2049   heparin injection 5,000 Units  5,000 Units Subcutaneous Q8H Sunnie Nielsen, DO   5,000 Units at 07/01/22 0102   hydrALAZINE (APRESOLINE) injection 5 mg  5 mg Intravenous Q2H PRN Lorretta Harp, MD       levothyroxine (SYNTHROID) tablet 100 mcg  100 mcg Oral Daily Lorretta Harp, MD   100 mcg at 07/01/22 0628   lidocaine (LIDODERM) 5 % 1 patch  1 patch Transdermal Q24H Lorretta Harp, MD   1 patch at 07/01/22 1218   metoprolol tartrate (LOPRESSOR) tablet 25 mg  25 mg Oral BID Lorretta Harp, MD   25 mg at 07/01/22 0931   morphine (PF) 2 MG/ML injection 0.5 mg  0.5 mg Intravenous Q3H PRN Lorretta Harp, MD       multivitamin with minerals tablet 1 tablet  1 tablet Oral Daily Lorretta Harp, MD   1 tablet at 07/01/22 0931   ondansetron (ZOFRAN) injection 4 mg  4 mg Intravenous Q8H PRN Lorretta Harp, MD       oxyCODONE-acetaminophen (PERCOCET/ROXICET) 5-325 MG per tablet 1 tablet  1 tablet Oral Q4H PRN Lorretta Harp, MD   1 tablet at 07/01/22 0930   polyethylene glycol (MIRALAX / GLYCOLAX) packet 17 g  17 g Oral Daily PRN Lorretta Harp, MD       pravastatin (PRAVACHOL) tablet 20 mg  20 mg Oral QHS Lorretta Harp, MD   20 mg at 06/30/22 2050   rOPINIRole (REQUIP) tablet 0.25 mg  0.25 mg Oral QHS Lorretta Harp, MD   0.25 mg at 06/30/22 2049   senna-docusate (Senokot-S) tablet 1 tablet  1 tablet Oral Daily Lorretta Harp, MD   1 tablet at 07/01/22 7253     Discharge Medications: Please see discharge summary for a list of discharge medications.  Relevant Imaging Results:  Relevant Lab Results:   Additional Information SS#: [id number]  Margarito Liner, LCSW

## 2022-07-02 DIAGNOSIS — Z7189 Other specified counseling: Secondary | ICD-10-CM | POA: Diagnosis not present

## 2022-07-02 DIAGNOSIS — K56609 Unspecified intestinal obstruction, unspecified as to partial versus complete obstruction: Secondary | ICD-10-CM | POA: Diagnosis not present

## 2022-07-02 DIAGNOSIS — F0390 Unspecified dementia without behavioral disturbance: Secondary | ICD-10-CM

## 2022-07-02 DIAGNOSIS — Z515 Encounter for palliative care: Secondary | ICD-10-CM | POA: Diagnosis not present

## 2022-07-02 DIAGNOSIS — Z66 Do not resuscitate: Secondary | ICD-10-CM

## 2022-07-02 LAB — BASIC METABOLIC PANEL
Anion gap: 9 (ref 5–15)
BUN: 18 mg/dL (ref 8–23)
CO2: 21 mmol/L — ABNORMAL LOW (ref 22–32)
Calcium: 8.5 mg/dL — ABNORMAL LOW (ref 8.9–10.3)
Chloride: 99 mmol/L (ref 98–111)
Creatinine, Ser: 0.88 mg/dL (ref 0.44–1.00)
GFR, Estimated: >60 mL/min (ref >60)
Glucose, Bld: 163 mg/dL — ABNORMAL HIGH (ref 70–99)
Potassium: 5 mmol/L (ref 3.5–5.1)
Sodium: 129 mmol/L — ABNORMAL LOW (ref 135–145)

## 2022-07-02 LAB — GLUCOSE, CAPILLARY: Glucose-Capillary: 123 mg/dL — ABNORMAL HIGH (ref 70–99)

## 2022-07-02 MED ORDER — GABAPENTIN 300 MG PO CAPS: 600.0000 mg | ORAL_CAPSULE | Freq: Every day | ORAL | 0 refills | Status: DC

## 2022-07-02 MED ORDER — HYDROCODONE-ACETAMINOPHEN 10-325 MG PO TABS: 1.0000 | ORAL_TABLET | Freq: Four times a day (QID) | ORAL | 0 refills | Status: DC | PRN

## 2022-07-02 MED ORDER — POLYETHYLENE GLYCOL 3350 17 G PO PACK: 17.0000 g | PACK | Freq: Every day | ORAL | 0 refills | Status: DC | PRN

## 2022-07-02 NOTE — Progress Notes (Signed)
Daily Progress Note   Patient Name: Theresa Snyder       Date: 07/02/2022 DOB: 1929-09-29  Age: 89 y.o. MRN#: [id number] Attending Physician: Loyce Dys, MD Primary Care Physician: [facility] Admit Date: 06/29/2022  Reason for Consultation/Follow-up: Establishing goals of care  Subjective: Feels well, tolerating some grits for breakfast  Length of Stay: 3  Current Medications: Scheduled Meds:   amLODipine  10 mg Oral Q breakfast   cholecalciferol  1,000 Units Oral Daily   gabapentin  600 mg Oral QHS   heparin injection (subcutaneous)  5,000 Units Subcutaneous Q8H   levothyroxine  100 mcg Oral Daily   lidocaine  1 patch Transdermal Q24H   metoprolol tartrate  25 mg Oral BID   multivitamin with minerals  1 tablet Oral Daily   pravastatin  20 mg Oral QHS   rOPINIRole  0.25 mg Oral QHS   senna-docusate  1 tablet Oral Daily    Continuous Infusions:  sodium chloride 75 mL/hr at 07/02/22 1000    PRN Meds: acetaminophen, acetaminophen, albuterol, dextromethorphan-guaiFENesin, docusate sodium, hydrALAZINE, morphine injection, ondansetron (ZOFRAN) IV, oxyCODONE-acetaminophen, polyethylene glycol  Physical Exam Constitutional:      General: She is not in acute distress.    Appearance: She is ill-appearing.  Pulmonary:     Effort: Pulmonary effort is normal.  Skin:    General: Skin is warm and dry.  Neurological:     Mental Status: She is alert and oriented to person, place, and time.     Comments: Easily confused             Vital Signs: BP (!) 165/141 (BP Location: Right Arm)   Pulse 71   Temp 98.2 F (36.8 C) (Oral)   Resp 18   Ht 5' (1.524 m)   Wt 68.5 kg   SpO2 100%   BMI 29.49 kg/m  SpO2: SpO2: 100 % O2 Device: O2 Device: Nasal Cannula O2  Flow Rate: O2 Flow Rate (L/min): 2 L/min  Intake/output summary: No intake or output data in the 24 hours ending 07/02/22 1157 LBM: Last BM Date : 07/01/22 Baseline Weight: Weight: 63.9 kg Most recent weight: Weight: 68.5 kg       Palliative Assessment/Data: PPS 20%      Patient Active Problem List   Diagnosis Date Noted   Large bowel obstruction (HCC) 06/29/2022   HTN (hypertension) 06/29/2022   Chronic kidney disease, stage 3a (HCC) 06/29/2022   Left rib fracture 06/29/2022   SOB (shortness of breath) 06/29/2022   Compression fracture of T6 vertebra (HCC) 06/23/2022   Dementia without behavioral disturbance (HCC) 06/22/2022   Hyponatremia 06/21/2022   Left hip pain 06/21/2022   Closed fracture of right thumb 12/21/2021   Hypomagnesemia 12/21/2021   Sinus tachycardia 12/21/2021   Atrial fibrillation with RVR (HCC) 12/21/2021   Acute metabolic encephalopathy 10/31/2021   UTI (urinary tract infection) 10/31/2021   Dehydration 10/31/2021   Chronic diastolic CHF (congestive heart failure) (HCC) 10/31/2021   Multifocal pneumonia 08/13/2021   Acute on chronic diastolic CHF (congestive heart failure) (HCC) 08/13/2021   Neuropathic pain 08/13/2021   Leukocytosis 08/13/2021   Pneumonia 08/13/2021   Acute dyspnea 08/12/2021  Lymphedema of leg    Hip fracture (HCC) 07/09/2021   Colonic obstruction (HCC) 05/21/2021   Hiatal hernia 05/21/2021   Severe sepsis with lactic acidosis (HCC) 05/21/2021   Lumbar compression fracture (HCC) 03/24/2020   Fall at home, initial encounter 03/23/2020   L4 vertebral fracture (HCC) 03/23/2020   Chronic kidney disease 03/04/2015   Adaptive colitis 03/04/2015   BP (high blood pressure) 05/18/2013   Hypothyroidism 05/18/2013   Hyperlipidemia 05/18/2013    Palliative Care Assessment & Plan   HPI: 89 y.o. female  with past medical history of hiatal hernia, hypertension, hyperlipidemia, diastolic CHF, hypothyroidism, ZOX-0R, dementia, and  spinal stenosis admitted on 06/29/2022 with worsening abdominal pain.  Patient found to have large partial bowel obstruction due to large hiatal hernia.  Deemed not a good surgery candidate.  PMT consulted to discuss goals of care.  Assessment: Patient had just eaten most of a bowl of grits when I entered - tells me she feels well, no nausea. She is ready to be discharged. She understands hospice will be involved in her care moving forward. No symptom management needs.   Recommendations/Plan: Focus on comfort, return to LTC with hospice Symptoms well controlled at this point DNR/DNI  Code Status: DNR  Discharge Planning: Skilled Nursing Facility with Hospice   Thank you for allowing the Palliative Medicine Team to assist in the care of this patient.  *Please note that this is a verbal dictation therefore any spelling or grammatical errors are due to the "[employer]agon [employer] One" system interpretation.  Gerlean Ren, DNP, [facility] Palliative Medicine Team Team Phone # [phone]  Pager [phone]

## 2022-07-02 NOTE — Discharge Summary (Signed)
Physician Discharge Summary   Patient: Theresa Snyder MRN: [id number] DOB: 03-Nov-1929  Admit date:     06/29/2022  Discharge date: 07/02/22  Discharge Physician: Loyce Dys   PCP: [facility]     Discharge Diagnoses:  Large bowel obstruction due to large hiatal hernia:  Chronic diastolic CHF (congestive heart failure) (HCC):  SOB (shortness of breath) Acute hypoxic respiratory failure  Possibly due to large hiatal hernia causing atelectasis.   Hyperlipidemia Hyponatremia: improved HTN (hypertension) Hypothyroidism Chronic kidney disease, stage 3a [facility]):  Left rib fracture:  Dementia without behavioral disturbance [facility])   Hospital Course: [name] is a 89 y.o. female with medical history significant of hiatal hernia, hypertension, hyperlipidemia, diastolic CHF, hypothyroidism, Theresa Snyder, dementia, spinal stenosis, who presents to ED 06/28/2022 from SNF [facility]) via EMS with abdominal pain worsening x2 days, and shortness of breath.  On presentation patient had slightly worsening renal function which was thought to be due to dehydration, sodium 126, oxygen saturation 86% on room air, which improved to 100% on 2 L oxygen. Chest x-ray showed large hiatal hernia and atelectasis. CTA negative for PE, does show left fifth rib fracture. CT abdomen/pelvis showed large hiatal hernia with large bowel obstruction. General Surgery was consulted. Pt reports not wanting surgery and at any rate is not good surgical candidate, family interested in hospice.  Palliative and hospice was consulted and patient and family have agreed for discharge on hospice.  Patient abdominal pain has improved and currently having bowel movements.  She is therefore being discharged today to follow-up with hospice   Consultants: Neurosurgery Procedures performed: None Disposition: Hospice care Diet recommendation:  Discharge Diet Orders (From admission, onward)     Start      Ordered   07/02/22 0000  Diet - low sodium heart healthy        07/02/22 1012           Full liquid diet DISCHARGE MEDICATION: Allergies as of 07/02/2022       Reactions   Iodinated Contrast Media Other (See Comments)   Blue, Green and Red dyes    Lorazepam    Altered mental state   Risperidone Other (See Comments)   Altered mental state    Sulfamethoxazole-trimethoprim    Hyper        Medication List     STOP taking these medications    furosemide 10 MG/ML injection Commonly known as: LASIX   furosemide 20 MG tablet Commonly known as: Lasix   furosemide 40 MG tablet Commonly known as: LASIX   loperamide 2 MG capsule Commonly known as: IMODIUM   predniSONE 20 MG tablet Commonly known as: DELTASONE       TAKE these medications    albuterol 108 (90 Base) MCG/ACT inhaler Commonly known as: VENTOLIN HFA Inhale 2 puffs into the lungs every 6 (six) hours as needed for wheezing or shortness of breath.   amLODipine 10 MG tablet Commonly known as: NORVASC Take 10 mg by mouth daily with breakfast. for high blood pressure   cholecalciferol 25 MCG (1000 UNIT) tablet Commonly known as: VITAMIN D3 Take 1,000 Units by mouth daily.   diclofenac Sodium 1 % Gel Commonly known as: VOLTAREN Apply 2 g topically 4 (four) times daily.   docusate sodium 100 MG capsule Commonly known as: COLACE Take 1 capsule (100 mg total) by mouth 2 (two) times daily as needed for mild constipation.   gabapentin 300 MG capsule Commonly known as:  NEURONTIN Take 2 capsules (600 mg total) by mouth at bedtime. What changed: additional instructions   HYDROcodone-acetaminophen 10-325 MG tablet Commonly known as: NORCO Take 1 tablet by mouth every 6 (six) hours as needed. 6-8 hours as needed.  SNF use only.  Refills per SNF MD/DO.   ipratropium-albuterol 0.5-2.5 (3) MG/3ML Soln Commonly known as: DUONEB Take 3 mLs by nebulization every 6 (six) hours as needed (SOB/Respiratory  distress).   levothyroxine 100 MCG tablet Commonly known as: SYNTHROID Take 100 mcg by mouth daily.   lidocaine 5 % Commonly known as: LIDODERM Place 1 patch onto the skin daily. Remove & Discard patch within 12 hours or as directed by MD   metoprolol tartrate 25 MG tablet Commonly known as: LOPRESSOR Take 1 tablet (25 mg total) by mouth 2 (two) times daily.   multivitamin with minerals Tabs tablet Take 1 tablet by mouth daily.   polyethylene glycol 17 g packet Commonly known as: MIRALAX / GLYCOLAX Take 17 g by mouth daily as needed for mild constipation.   pravastatin 20 MG tablet Commonly known as: PRAVACHOL Take 20 mg by mouth at bedtime.   rOPINIRole 0.25 MG tablet Commonly known as: REQUIP Take 0.25 mg by mouth at bedtime.   senna-docusate 8.6-50 MG tablet Commonly known as: Senokot-S Take 1 tablet by mouth daily.   Tylenol 325 MG tablet Generic drug: acetaminophen Take 650 mg by mouth every 6 (six) hours as needed for mild pain.        Contact information for after-discharge care     Destination     [facility] Preferred SNF .   Service: Skilled Nursing Contact information: [address] [place] [address] [phone]                    Discharge Exam: Theresa Snyder Weights   06/30/22 0500 07/01/22 0415 07/02/22 0500  Weight: 66.1 kg 67.5 kg 68.5 kg   Constitutional:      General: She is not in acute distress. Cardiovascular:     Rate and Rhythm: Normal rate and regular rhythm.  Pulmonary:     Effort: Pulmonary effort is normal.     Breath sounds: Normal breath sounds.  Abdominal:     General: Bowel sounds are normal.     Palpations: Abdomen is soft.  No abdominal tenderness Skin:    General: Skin is warm and dry.  Neurological:     General: No focal deficit present.     Mental Status: She is alert.  Psychiatric:        Mood and Affect: Mood  normal.        Behavior: Behavior normal.     Condition at discharge: fair  The results of significant diagnostics from this hospitalization (including imaging, microbiology, ancillary and laboratory) are listed below for reference.   Imaging Studies: CT Angio Chest PE W/Cm &/Or Wo Cm  Result Date: 06/29/2022 CLINICAL DATA:  Pulmonary embolism (PE) suspected, high prob; Abdominal pain, acute, nonlocalized EXAM: CT ANGIOGRAPHY CHEST CT ABDOMEN AND PELVIS WITH CONTRAST TECHNIQUE: Multidetector CT imaging of the chest was performed using the standard protocol during bolus administration of intravenous contrast. Multiplanar CT image reconstructions and MIPs were obtained to evaluate the vascular anatomy. Multidetector CT imaging of the abdomen and pelvis was performed using the standard protocol during bolus administration of intravenous contrast. RADIATION DOSE REDUCTION: This exam was performed according to the departmental dose-optimization program which  includes automated exposure control, adjustment of the mA and/or kV according to patient size and/or use of iterative reconstruction technique. CONTRAST:  75mL OMNIPAQUE IOHEXOL 350 MG/ML SOLN COMPARISON:  CT chest 08/12/2021, CT abdomen pelvis 05/21/2021 FINDINGS: CTA CHEST FINDINGS Cardiovascular: There is adequate opacification of the pulmonary arterial tree. No intraluminal filling defect identified to suggest acute pulmonary embolism. The central pulmonary arteries are of normal caliber. Extensive multi-vessel coronary artery calcification. Calcification of the mitral valve leaflets. Global cardiac size within normal limits. No pericardial effusion. Mild atherosclerotic calcification within the thoracic aorta. No aortic aneurysm. Mediastinum/Nodes: The thyroid gland is atrophic. No pathologic thoracic adenopathy. The esophagus is distally fluid-filled which may relate to esophageal dysmotility or gastroesophageal reflux. A large hiatal hernia is again  identified, similar to prior examination containing the transverse colon and stomach. Lungs/Pleura: Small bilateral pleural effusions are present. This in combination with a large hiatal hernia resultant compressive atelectasis of the lower lobes bilaterally. No superimposed confluent pulmonary infiltrate. Mild right apical pleuroparenchymal scarring. No pneumothorax. No central obstructing lesion. Musculoskeletal: There is an acute fracture of the left fifth rib posteriorly. Numerous healed bilateral rib fractures are identified. Posttraumatic deformity of the right scapula noted. Interval development of a remote appearing severe T6 compression deformity with 60-70% loss of height. Accentuated thoracic kyphosis. Degenerative changes are seen throughout the thoracic spine. Review of the MIP images confirms the above findings. CT ABDOMEN and PELVIS FINDINGS Hepatobiliary: No focal liver abnormality is seen. No gallstones, gallbladder wall thickening, or biliary dilatation. Pancreas: Unremarkable Spleen: Unremarkable Adrenals/Urinary Tract: The adrenal glands are unremarkable. The kidneys are atrophic bilaterally, left greater than right. Simple cortical cysts are seen within the kidneys bilaterally for which no follow-up imaging is recommended. The kidneys are otherwise unremarkable. The bladder is unremarkable. Stomach/Bowel: There is large volume stool within the ascending and herniated transverse colon within the thorax with an abrupt caliber change as the colon exits the hernia sac best seen at axial image # 31/5 in keeping with a partial large bowel obstruction. The distal colon is largely decompressed. Moderate sigmoid diverticulosis. The stomach, small bowel, and large bowel are otherwise unremarkable. The appendix is not clearly identified and is likely absent. No free intraperitoneal gas or fluid. Vascular/Lymphatic: Aortic atherosclerosis. No enlarged abdominal or pelvic lymph nodes. Reproductive: 2 simple  cysts within the right ovary measure up to 2 cm in greatest dimension. The pelvic organs are otherwise unremarkable. Other: No abdominal wall hernia. Musculoskeletal: Left bipolar hemiarthroplasty has been performed. Osseous structures are diffusely osteopenic. Remote severe compression deformities of L3 and L4 are noted with retropulsion of the posterosuperior aspect of these vertebral bodies and resultant severe central canal stenosis at L3. While remote appearing, the L3 fracture is new since prior examination of 05/21/2021. Grade 1 anterolisthesis of L4 upon L5 in combination with degenerative changes results in severe central canal stenosis at this level. No acute bone abnormality. Review of the MIP images confirms the above findings. IMPRESSION: 1. No pulmonary embolism. 2. Extensive multi-vessel coronary artery calcification. 3. Acute fracture of the left fifth rib posteriorly. No pneumothorax. 4. Large hiatal hernia containing the transverse colon and stomach. Associated partial large bowel obstruction with large volume stool within the ascending and herniated transverse colon within the thorax with transition point as the transverse colon exits the hernia sac within the left upper quadrant. 5. Moderate sigmoid diverticulosis without superimposed acute inflammatory change. 6. Remote appearing severe T6, L3, and L4 compression fractures with resultant severe central canal  stenosis at L3 and L4. The T6 and L3 fracture are new since prior examination of 08/12/2021 and 05/21/2021, respectively. Aortic Atherosclerosis (ICD10-I70.0). Electronically Signed   By: Helyn Numbers M.D.   On: 06/29/2022 03:24   CT ABDOMEN PELVIS W CONTRAST  Result Date: 06/29/2022 CLINICAL DATA:  Pulmonary embolism (PE) suspected, high prob; Abdominal pain, acute, nonlocalized EXAM: CT ANGIOGRAPHY CHEST CT ABDOMEN AND PELVIS WITH CONTRAST TECHNIQUE: Multidetector CT imaging of the chest was performed using the standard protocol  during bolus administration of intravenous contrast. Multiplanar CT image reconstructions and MIPs were obtained to evaluate the vascular anatomy. Multidetector CT imaging of the abdomen and pelvis was performed using the standard protocol during bolus administration of intravenous contrast. RADIATION DOSE REDUCTION: This exam was performed according to the departmental dose-optimization program which includes automated exposure control, adjustment of the mA and/or kV according to patient size and/or use of iterative reconstruction technique. CONTRAST:  75mL OMNIPAQUE IOHEXOL 350 MG/ML SOLN COMPARISON:  CT chest 08/12/2021, CT abdomen pelvis 05/21/2021 FINDINGS: CTA CHEST FINDINGS Cardiovascular: There is adequate opacification of the pulmonary arterial tree. No intraluminal filling defect identified to suggest acute pulmonary embolism. The central pulmonary arteries are of normal caliber. Extensive multi-vessel coronary artery calcification. Calcification of the mitral valve leaflets. Global cardiac size within normal limits. No pericardial effusion. Mild atherosclerotic calcification within the thoracic aorta. No aortic aneurysm. Mediastinum/Nodes: The thyroid gland is atrophic. No pathologic thoracic adenopathy. The esophagus is distally fluid-filled which may relate to esophageal dysmotility or gastroesophageal reflux. A large hiatal hernia is again identified, similar to prior examination containing the transverse colon and stomach. Lungs/Pleura: Small bilateral pleural effusions are present. This in combination with a large hiatal hernia resultant compressive atelectasis of the lower lobes bilaterally. No superimposed confluent pulmonary infiltrate. Mild right apical pleuroparenchymal scarring. No pneumothorax. No central obstructing lesion. Musculoskeletal: There is an acute fracture of the left fifth rib posteriorly. Numerous healed bilateral rib fractures are identified. Posttraumatic deformity of the right  scapula noted. Interval development of a remote appearing severe T6 compression deformity with 60-70% loss of height. Accentuated thoracic kyphosis. Degenerative changes are seen throughout the thoracic spine. Review of the MIP images confirms the above findings. CT ABDOMEN and PELVIS FINDINGS Hepatobiliary: No focal liver abnormality is seen. No gallstones, gallbladder wall thickening, or biliary dilatation. Pancreas: Unremarkable Spleen: Unremarkable Adrenals/Urinary Tract: The adrenal glands are unremarkable. The kidneys are atrophic bilaterally, left greater than right. Simple cortical cysts are seen within the kidneys bilaterally for which no follow-up imaging is recommended. The kidneys are otherwise unremarkable. The bladder is unremarkable. Stomach/Bowel: There is large volume stool within the ascending and herniated transverse colon within the thorax with an abrupt caliber change as the colon exits the hernia sac best seen at axial image # 31/5 in keeping with a partial large bowel obstruction. The distal colon is largely decompressed. Moderate sigmoid diverticulosis. The stomach, small bowel, and large bowel are otherwise unremarkable. The appendix is not clearly identified and is likely absent. No free intraperitoneal gas or fluid. Vascular/Lymphatic: Aortic atherosclerosis. No enlarged abdominal or pelvic lymph nodes. Reproductive: 2 simple cysts within the right ovary measure up to 2 cm in greatest dimension. The pelvic organs are otherwise unremarkable. Other: No abdominal wall hernia. Musculoskeletal: Left bipolar hemiarthroplasty has been performed. Osseous structures are diffusely osteopenic. Remote severe compression deformities of L3 and L4 are noted with retropulsion of the posterosuperior aspect of these vertebral bodies and resultant severe central canal stenosis at L3. While  remote appearing, the L3 fracture is new since prior examination of 05/21/2021. Grade 1 anterolisthesis of L4 upon L5 in  combination with degenerative changes results in severe central canal stenosis at this level. No acute bone abnormality. Review of the MIP images confirms the above findings. IMPRESSION: 1. No pulmonary embolism. 2. Extensive multi-vessel coronary artery calcification. 3. Acute fracture of the left fifth rib posteriorly. No pneumothorax. 4. Large hiatal hernia containing the transverse colon and stomach. Associated partial large bowel obstruction with large volume stool within the ascending and herniated transverse colon within the thorax with transition point as the transverse colon exits the hernia sac within the left upper quadrant. 5. Moderate sigmoid diverticulosis without superimposed acute inflammatory change. 6. Remote appearing severe T6, L3, and L4 compression fractures with resultant severe central canal stenosis at L3 and L4. The T6 and L3 fracture are new since prior examination of 08/12/2021 and 05/21/2021, respectively. Aortic Atherosclerosis (ICD10-I70.0). Electronically Signed   By: Helyn Numbers M.D.   On: 06/29/2022 03:24   DG Chest 2 View  Result Date: 06/28/2022 CLINICAL DATA:  Shortness of breath. EXAM: CHEST - 2 VIEW COMPARISON:  Chest radiograph dated 06/21/2022. FINDINGS: Large hiatal hernia with associated compressive atelectasis of the lung bases. Pneumonia is not excluded. No pleural effusion or pneumothorax. The cardiac borders are silhouetted. Osteopenia with degenerative changes of the spine. No acute osseous pathology. IMPRESSION: Large hiatal hernia with associated compressive atelectasis of the lung bases. Electronically Signed   By: Elgie Collard M.D.   On: 06/28/2022 23:54   CT HEAD WO CONTRAST ( )  Result Date: 06/24/2022 CLINICAL DATA:  Initial evaluation for mental status change, headache. EXAM: CT HEAD WITHOUT CONTRAST TECHNIQUE: Contiguous axial images were obtained from the base of the skull through the vertex without intravenous contrast. RADIATION DOSE  REDUCTION: This exam was performed according to the departmental dose-optimization program which includes automated exposure control, adjustment of the mA and/or kV according to patient size and/or use of iterative reconstruction technique. COMPARISON:  Prior study from 06/21/2022. FINDINGS: Brain: Age-related cerebral atrophy with mild chronic small vessel ischemic disease. No acute intracranial hemorrhage. No acute large vessel territory infarct. No mass lesion or midline shift. No hydrocephalus or extra-axial fluid collection. Vascular: No abnormal hyperdense vessel. Scattered calcified atherosclerosis present at the skull base. Skull: Scalp soft tissues demonstrate no acute finding. Calvarium intact. Sinuses/Orbits: Mild mucoperiosteal thickening present about the ethmoidal air cells and maxillary sinuses. Paranasal sinuses are otherwise largely clear. No significant mastoid effusion. Other: None. IMPRESSION: 1. No acute intracranial abnormality. 2. Age-related cerebral atrophy with mild chronic small vessel ischemic disease. Electronically Signed   By: Rise Mu M.D.   On: 06/24/2022 23:37   MR THORACIC SPINE WO CONTRAST  Result Date: 06/22/2022 CLINICAL DATA:  Mid back pain EXAM: MRI THORACIC SPINE WITHOUT CONTRAST TECHNIQUE: Multiplanar, multisequence MR imaging of the thoracic spine was performed. No intravenous contrast was administered. COMPARISON:  No prior MRI of the thoracic spine, correlation is made with 06/22/2022 thoracic spine radiograph, MRI lumbar spine 01/26/2022, and 08/12/2021 CTA chest FINDINGS: Evaluation is limited by motion and early termination of the study after the axial T2 sequence was obtained. The axial T2 sequence is essentially nondiagnostic secondary to motion. Alignment: Exaggeration of the normal thoracic kyphosis. No significant listhesis in the thoracic spine. Vertebrae: Decreased T1 and T2 signal in the T6 vertebral body (series 18, image 5 and series 19, image  9), with increased T2 signal along the inferior aspect seen  on the STIR sequence (series 20, image 8). Approximately 60% vertebral body height loss anteriorly and 30% vertebral body height loss posteriorly, with 3 mm retropulsion the posterior cortex (series 16, image 5). No other significant vertebral body height loss thoracic spine. Mild wedging of T11 and T12 appears unchanged from the prior lumbar spine MRI. Partially imaged lumbar spine demonstrates remote fractures at L3 and L4, which appears similar to 01/26/2022. Cord:  Unable to evaluate given the sequences obtained. Paraspinal and other soft tissues: No acute finding. Disc levels: Evaluation is limited, but no high-grade spinal canal stenosis is seen. IMPRESSION: Evaluation is limited by motion and early termination of the study after the axial T2 sequence was obtained. Within this limitation, there is a compression fracture of the T6 vertebral body, with approximately 60% vertebral body height loss anteriorly and 30% vertebral body height loss posteriorly, with 3 mm retropulsion of the posterior cortex. This may be acute, subacute, or acute on subacute. No significant spinal canal stenosis. Electronically Signed   By: Wiliam Ke M.D.   On: 06/22/2022 17:27   DG Thoracic Spine 2 View  Result Date: 06/22/2022 CLINICAL DATA:  Back pain EXAM: THORACIC SPINE 2 VIEWS COMPARISON:  Lumbar spine radiograph done on 03/03/2022, CT chest done on 08/12/2021 FINDINGS: No recent fracture is seen. Osteopenia is seen in bony structures. There is moderate decrease in height of body of T6 vertebra. There is mild decrease in height of lower thoracic vertebral bodies, T11, T12 and L1 vertebrae. As far as seen, alignment of posterior margins of vertebral bodies is within normal limits. There is large hiatal hernia containing portion of transverse colon and stomach. IMPRESSION: There is moderate decrease in height of body of T6 vertebra. This finding was not evident in  the CT chest done on 08/12/2021. If clinically warranted, follow-up CT or MRI may be considered. There is mild decrease in height of bodies of T11, T12 and L1 vertebrae with no definite interval change. Electronically Signed   By: Ernie Avena M.D.   On: 06/22/2022 15:14   DG HIPS BILAT WITH PELVIS MIN 5 VIEWS  Result Date: 06/21/2022 CLINICAL DATA:  [id number] Hip pain, acute [id number] EXAM: DG HIP (WITH OR WITHOUT PELVIS) 5+V BILAT COMPARISON:  None Available. FINDINGS: Total left hip arthroplasty. No radiographic finding of surgical hardware complication. There is no evidence of hip fracture or dislocation of the right hip. No acute displaced fracture or diastasis of the bones of the pelvis. There is no evidence of severe arthropathy or other focal bone abnormality. Visualized lower lumbar spine demonstrates degenerative changes. IMPRESSION: Negative for acute traumatic injury in a patient status post total left hip arthroplasty. Electronically Signed   By: Tish Frederickson M.D.   On: 06/21/2022 23:34   CT Head Wo Contrast  Result Date: 06/21/2022 CLINICAL DATA:  Headaches and recent pneumonia diagnosis, initial encounter EXAM: CT HEAD WITHOUT CONTRAST TECHNIQUE: Contiguous axial images were obtained from the base of the skull through the vertex without intravenous contrast. RADIATION DOSE REDUCTION: This exam was performed according to the departmental dose-optimization program which includes automated exposure control, adjustment of the mA and/or kV according to patient size and/or use of iterative reconstruction technique. COMPARISON:  10/31/2021 FINDINGS: Brain: No evidence of acute infarction, hemorrhage, hydrocephalus, extra-axial collection or mass lesion/mass effect. Mild atrophic changes are noted. Chronic white matter ischemic changes are seen. Vascular: No hyperdense vessel or unexpected calcification. Skull: Normal. Negative for fracture or focal lesion. Sinuses/Orbits: No acute finding. Other:  None. IMPRESSION: Chronic atrophic and ischemic changes are noted. No acute abnormality noted. Electronically Signed   By: Alcide Clever M.D.   On: 06/21/2022 18:33   DG Chest 2 View  Result Date: 06/21/2022 CLINICAL DATA:  Chest pain with shortness of breath EXAM: CHEST - 2 VIEW COMPARISON:  Chest x-ray 12/20/2021 FINDINGS: Large hiatal hernia is again seen. The cardiomediastinal silhouette is otherwise within normal limits. There is no definite focal lung infiltrate, pleural effusion or pneumothorax. No acute fractures are seen. There are chronic right rib fractures. IMPRESSION: 1. No acute cardiopulmonary process. 2. Large hiatal hernia. Electronically Signed   By: Darliss Cheney M.D.   On: 06/21/2022 17:34    Microbiology: Results for orders placed or performed during the hospital encounter of 12/20/21  Blood culture (routine single)     Status: None   Collection Time: 12/20/21 11:15 PM   Specimen: BLOOD  Result Value Ref Range Status   Specimen Description BLOOD BOTTLES DRAWN AEROBIC AND ANAEROBIC  Final   Special Requests BLOOD Blood Culture adequate volume  Final   Culture   Final    NO GROWTH 5 DAYS Performed at [facility], [address] [place], [place] [address]    Report Status 12/26/2021 FINAL  Final  Urine Culture     Status: Abnormal   Collection Time: 12/20/21 11:15 PM   Specimen: In/Out Cath Urine  Result Value Ref Range Status   Specimen Description   Final    IN/OUT CATH URINE Performed at [facility], [address] [place], [place] [address]    Special Requests   Final    NONE Performed at [facility], [address] [place], [place] [address]    Culture MULTIPLE SPECIES PRESENT, SUGGEST RECOLLECTION (A)  Final   Report Status 12/22/2021 FINAL  Final    Labs: CBC: Recent Labs  Lab 06/26/22 0515 06/28/22 2122 06/30/22 0523  WBC 9.8 10.7* 14.6*  NEUTROABS 5.6  --   --   HGB 11.8* 13.7 11.7*  HCT 35.6* 42.2 36.7  MCV  92.0 93.0 94.8  PLT 312 349 297   Basic Metabolic Panel: Recent Labs  Lab 06/26/22 0515 06/28/22 2122 06/30/22 0036 06/30/22 0523 06/30/22 1135 07/01/22 0524 07/02/22 0521  NA 129*   < > 130* 133* 131* 134* 129*  K 4.1   < > 4.1 4.2 4.0 4.2 5.0  CL 99   < > 95* 98 98 100 99  CO2 23   < > 27 27 24 26  21*  GLUCOSE 96   < > 100* 87 83 91 163*  BUN 16   < > 32* 31* 29* 29* 18  CREATININE 0.97   < > 1.22* 1.20* 1.15* 1.03* 0.88  CALCIUM 8.5*   < > 8.2* 8.6* 8.5* 8.4* 8.5*  MG 1.5*  --   --   --   --   --   --   PHOS 3.1  --   --   --   --   --   --    < > = values in this interval not displayed.   Liver Function Tests: Recent Labs  Lab 06/28/22 2122  AST 24  ALT 15  ALKPHOS 116  BILITOT 0.7  PROT 7.0  ALBUMIN 3.8   CBG: Recent Labs  Lab 06/30/22 0845 07/01/22 0743 07/02/22 0737  GLUCAP 91 100* 123*    Discharge time spent: greater than 30 minutes.  Signed: Loyce Dys, MD  [facility] 07/02/2022

## 2022-07-02 NOTE — Care Management Important Message (Signed)
Important Message  Patient Details  Name: Theresa Snyder MRN: [id number] Date of Birth: 1929-02-22   Medicare Important Message Given:  Other (see comment)  Discharging with hospice services.  Medicare IM withheld at this time.    Johnell Comings 07/02/2022, 11:58 AM

## 2022-07-07 ENCOUNTER — Ambulatory Visit: Payer: Medicare Other | Admitting: Neurosurgery

## 2022-07-11 DEATH — deceased
